# Patient Record
Sex: Female | Born: 1970 | Race: Black or African American | Hispanic: No | Marital: Single | Smoking: Never smoker
Health system: Southern US, Community
[De-identification: ages and names within clinical notes are randomized; demographics above are authoritative.]

## PROBLEM LIST (undated history)

## (undated) DIAGNOSIS — Z9889 Other specified postprocedural states: Secondary | ICD-10-CM

## (undated) DIAGNOSIS — R519 Headache, unspecified: Secondary | ICD-10-CM

## (undated) DIAGNOSIS — F32A Depression, unspecified: Secondary | ICD-10-CM

## (undated) DIAGNOSIS — R112 Nausea with vomiting, unspecified: Secondary | ICD-10-CM

## (undated) DIAGNOSIS — F329 Major depressive disorder, single episode, unspecified: Secondary | ICD-10-CM

## (undated) DIAGNOSIS — I10 Essential (primary) hypertension: Secondary | ICD-10-CM

## (undated) DIAGNOSIS — D649 Anemia, unspecified: Secondary | ICD-10-CM

## (undated) DIAGNOSIS — D329 Benign neoplasm of meninges, unspecified: Secondary | ICD-10-CM

## (undated) DIAGNOSIS — K219 Gastro-esophageal reflux disease without esophagitis: Secondary | ICD-10-CM

## (undated) DIAGNOSIS — F419 Anxiety disorder, unspecified: Secondary | ICD-10-CM

## (undated) DIAGNOSIS — R51 Headache: Secondary | ICD-10-CM

## (undated) HISTORY — PX: WISDOM TOOTH EXTRACTION: SHX21

## (undated) HISTORY — DX: Benign neoplasm of meninges, unspecified: D32.9

## (undated) HISTORY — DX: Depression, unspecified: F32.A

---
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
## 2016-05-15 DIAGNOSIS — R5383 Other fatigue: Secondary | ICD-10-CM | POA: Diagnosis not present

## 2016-05-15 DIAGNOSIS — R51 Headache: Secondary | ICD-10-CM | POA: Diagnosis not present

## 2016-05-15 DIAGNOSIS — R1084 Generalized abdominal pain: Secondary | ICD-10-CM | POA: Diagnosis not present

## 2016-05-15 DIAGNOSIS — I10 Essential (primary) hypertension: Secondary | ICD-10-CM | POA: Diagnosis not present

## 2016-05-15 DIAGNOSIS — R42 Dizziness and giddiness: Secondary | ICD-10-CM | POA: Diagnosis not present

## 2016-05-16 DIAGNOSIS — R51 Headache: Secondary | ICD-10-CM | POA: Diagnosis not present

## 2016-05-16 DIAGNOSIS — R1084 Generalized abdominal pain: Secondary | ICD-10-CM | POA: Diagnosis not present

## 2016-05-16 DIAGNOSIS — D329 Benign neoplasm of meninges, unspecified: Secondary | ICD-10-CM | POA: Diagnosis not present

## 2016-05-16 DIAGNOSIS — R42 Dizziness and giddiness: Secondary | ICD-10-CM | POA: Diagnosis not present

## 2016-05-16 DIAGNOSIS — G936 Cerebral edema: Secondary | ICD-10-CM | POA: Diagnosis not present

## 2016-05-16 DIAGNOSIS — I10 Essential (primary) hypertension: Secondary | ICD-10-CM | POA: Diagnosis not present

## 2016-05-17 ENCOUNTER — Other Ambulatory Visit (HOSPITAL_COMMUNITY): Payer: Self-pay | Admitting: Neurosurgery

## 2016-05-17 DIAGNOSIS — D329 Benign neoplasm of meninges, unspecified: Secondary | ICD-10-CM | POA: Diagnosis not present

## 2016-05-17 DIAGNOSIS — Z6831 Body mass index (BMI) 31.0-31.9, adult: Secondary | ICD-10-CM | POA: Diagnosis not present

## 2016-05-21 ENCOUNTER — Ambulatory Visit (HOSPITAL_COMMUNITY)
Admission: RE | Admit: 2016-05-21 | Discharge: 2016-05-21 | Disposition: A | Discharge status: Home / Self Care | Payer: 59 | Source: Ambulatory Visit | Attending: Neurosurgery | Admitting: Neurosurgery

## 2016-05-21 DIAGNOSIS — D329 Benign neoplasm of meninges, unspecified: Secondary | ICD-10-CM | POA: Insufficient documentation

## 2016-05-21 DIAGNOSIS — R51 Headache: Secondary | ICD-10-CM | POA: Diagnosis not present

## 2016-05-21 DIAGNOSIS — M4802 Spinal stenosis, cervical region: Secondary | ICD-10-CM | POA: Diagnosis not present

## 2016-05-21 LAB — POCT I-STAT CREATININE: CREATININE: 0.8 mg/dL (ref 0.44–1.00)

## 2016-05-21 MED ORDER — GADOBENATE DIMEGLUMINE 529 MG/ML IV SOLN
19.0000 mL | Freq: Once | INTRAVENOUS | Status: AC | PRN
  Administered 2016-05-21: 19 mL via INTRAVENOUS

## 2016-05-24 ENCOUNTER — Other Ambulatory Visit: Payer: Self-pay | Admitting: Neurosurgery

## 2016-05-25 ENCOUNTER — Encounter (HOSPITAL_COMMUNITY): Payer: Self-pay | Admitting: *Deleted

## 2016-05-25 NOTE — Progress Notes (Signed)
Spoke with pt for pre-op call. Pt denies cardiac history, chest pain or sob. Pt is a Jehovah's Witness and will be bringing her Power of Attorney card stating no blood transfusions.

## 2016-05-25 NOTE — Anesthesia Preprocedure Evaluation (Addendum)
Anesthesia Evaluation  Patient identified by MRN, date of birth, ID band Patient awake    Reviewed: Allergy & Precautions, H&P , NPO status , Patient's Chart, lab work & pertinent test results, reviewed documented beta blocker date and time   Airway Mallampati: II  TM Distance: >3 FB Neck ROM: Full    Dental no notable dental hx. (+) Teeth Intact, Dental Advisory Given   Pulmonary neg pulmonary ROS,    Pulmonary exam normal breath sounds clear to auscultation       Cardiovascular hypertension, Pt. on medications and Pt. on home beta blockers  Rhythm:Regular Rate:Normal     Neuro/Psych  Headaches, Anxiety negative psych ROS   GI/Hepatic Neg liver ROS, GERD  Controlled,  Endo/Other  diabetes  Renal/GU negative Renal ROS  negative genitourinary   Musculoskeletal   Abdominal   Peds  Hematology negative hematology ROS (+)   Anesthesia Other Findings   Reproductive/Obstetrics negative OB ROS                           Anesthesia Physical Anesthesia Plan  ASA: II  Anesthesia Plan: General   Post-op Pain Management:    Induction: Intravenous  Airway Management Planned: Oral ETT  Additional Equipment: Arterial line and CVP  Intra-op Plan:   Post-operative Plan: Extubation in OR and Possible Post-op intubation/ventilation  Informed Consent: I have reviewed the patients History and Physical, chart, labs and discussed the procedure including the risks, benefits and alternatives for the proposed anesthesia with the patient or authorized representative who has indicated his/her understanding and acceptance.   Dental advisory given  Plan Discussed with: CRNA and Surgeon  Anesthesia Plan Comments:        Anesthesia Quick Evaluation

## 2016-05-26 ENCOUNTER — Inpatient Hospital Stay (HOSPITAL_COMMUNITY): Payer: 59 | Admitting: Certified Registered"

## 2016-05-26 ENCOUNTER — Inpatient Hospital Stay (HOSPITAL_COMMUNITY): Payer: 59

## 2016-05-26 ENCOUNTER — Inpatient Hospital Stay (HOSPITAL_COMMUNITY)
Admission: RE | Admit: 2016-05-26 | Discharge: 2016-06-01 | DRG: 026 | Disposition: A | Discharge status: Home / Self Care | Payer: 59 | Source: Ambulatory Visit | Attending: Neurosurgery | Admitting: Neurosurgery

## 2016-05-26 ENCOUNTER — Encounter (HOSPITAL_COMMUNITY): Payer: Self-pay | Admitting: Urology

## 2016-05-26 ENCOUNTER — Encounter (HOSPITAL_COMMUNITY)
Admission: RE | Disposition: A | Discharge status: Home / Self Care | Payer: Self-pay | Source: Ambulatory Visit | Attending: Neurosurgery

## 2016-05-26 DIAGNOSIS — Z452 Encounter for adjustment and management of vascular access device: Secondary | ICD-10-CM

## 2016-05-26 DIAGNOSIS — D320 Benign neoplasm of cerebral meninges: Secondary | ICD-10-CM | POA: Diagnosis not present

## 2016-05-26 DIAGNOSIS — D329 Benign neoplasm of meninges, unspecified: Secondary | ICD-10-CM | POA: Diagnosis not present

## 2016-05-26 DIAGNOSIS — I97711 Intraoperative cardiac arrest during other surgery: Secondary | ICD-10-CM | POA: Diagnosis not present

## 2016-05-26 DIAGNOSIS — D332 Benign neoplasm of brain, unspecified: Secondary | ICD-10-CM

## 2016-05-26 DIAGNOSIS — E119 Type 2 diabetes mellitus without complications: Secondary | ICD-10-CM | POA: Diagnosis present

## 2016-05-26 DIAGNOSIS — R001 Bradycardia, unspecified: Secondary | ICD-10-CM | POA: Diagnosis not present

## 2016-05-26 DIAGNOSIS — I10 Essential (primary) hypertension: Secondary | ICD-10-CM | POA: Diagnosis present

## 2016-05-26 DIAGNOSIS — K219 Gastro-esophageal reflux disease without esophagitis: Secondary | ICD-10-CM | POA: Diagnosis present

## 2016-05-26 DIAGNOSIS — R51 Headache: Secondary | ICD-10-CM | POA: Diagnosis present

## 2016-05-26 HISTORY — PX: CRANIOTOMY: SHX93

## 2016-05-26 HISTORY — DX: Headache, unspecified: R51.9

## 2016-05-26 HISTORY — DX: Anemia, unspecified: D64.9

## 2016-05-26 HISTORY — DX: Essential (primary) hypertension: I10

## 2016-05-26 HISTORY — DX: Anxiety disorder, unspecified: F41.9

## 2016-05-26 HISTORY — DX: Gastro-esophageal reflux disease without esophagitis: K21.9

## 2016-05-26 HISTORY — DX: Headache: R51

## 2016-05-26 LAB — BASIC METABOLIC PANEL
Anion gap: 8 (ref 5–15)
BUN: 7 mg/dL (ref 6–20)
CALCIUM: 9 mg/dL (ref 8.9–10.3)
CO2: 24 mmol/L (ref 22–32)
CREATININE: 0.74 mg/dL (ref 0.44–1.00)
Chloride: 104 mmol/L (ref 101–111)
Glucose, Bld: 111 mg/dL — ABNORMAL HIGH (ref 65–99)
Potassium: 3.8 mmol/L (ref 3.5–5.1)
SODIUM: 136 mmol/L (ref 135–145)

## 2016-05-26 LAB — CBC
HCT: 36.6 % (ref 36.0–46.0)
Hemoglobin: 12.4 g/dL (ref 12.0–15.0)
MCH: 28.4 pg (ref 26.0–34.0)
MCHC: 33.9 g/dL (ref 30.0–36.0)
MCV: 83.9 fL (ref 78.0–100.0)
PLATELETS: 360 10*3/uL (ref 150–400)
RBC: 4.36 MIL/uL (ref 3.87–5.11)
RDW: 12.8 % (ref 11.5–15.5)
WBC: 6.2 10*3/uL (ref 4.0–10.5)

## 2016-05-26 LAB — SURGICAL PCR SCREEN
MRSA, PCR: NEGATIVE
STAPHYLOCOCCUS AUREUS: POSITIVE — AB

## 2016-05-26 LAB — HCG, SERUM, QUALITATIVE: PREG SERUM: NEGATIVE

## 2016-05-26 LAB — NO BLOOD PRODUCTS

## 2016-05-26 SURGERY — CRANIOTOMY TUMOR EXCISION
Anesthesia: General | Site: Head

## 2016-05-26 MED ORDER — BUPIVACAINE HCL (PF) 0.5 % IJ SOLN
INTRAMUSCULAR | Status: DC | PRN
  Administered 2016-05-26: 20 mL

## 2016-05-26 MED ORDER — FENTANYL CITRATE (PF) 100 MCG/2ML IJ SOLN
INTRAMUSCULAR | Status: DC | PRN
  Administered 2016-05-26: 50 ug via INTRAVENOUS
  Administered 2016-05-26: 25 ug via INTRAVENOUS

## 2016-05-26 MED ORDER — MIDAZOLAM HCL 2 MG/2ML IJ SOLN
INTRAMUSCULAR | Status: AC
  Filled 2016-05-26: qty 2

## 2016-05-26 MED ORDER — 0.9 % SODIUM CHLORIDE (POUR BTL) OPTIME
TOPICAL | Status: DC | PRN
  Administered 2016-05-26 (×2): 1000 mL

## 2016-05-26 MED ORDER — SUGAMMADEX SODIUM 200 MG/2ML IV SOLN
INTRAVENOUS | Status: DC | PRN
  Administered 2016-05-26: 200 mg via INTRAVENOUS

## 2016-05-26 MED ORDER — PROMETHAZINE HCL 25 MG/ML IJ SOLN
6.2500 mg | Freq: Once | INTRAMUSCULAR | Status: AC
  Administered 2016-05-26: 6.25 mg via INTRAVENOUS

## 2016-05-26 MED ORDER — CEFAZOLIN SODIUM-DEXTROSE 2-4 GM/100ML-% IV SOLN
2.0000 g | INTRAVENOUS | Status: AC
  Administered 2016-05-26 (×2): 2 g via INTRAVENOUS

## 2016-05-26 MED ORDER — LIDOCAINE-EPINEPHRINE 1 %-1:100000 IJ SOLN
INTRAMUSCULAR | Status: AC
  Filled 2016-05-26: qty 1

## 2016-05-26 MED ORDER — SENNOSIDES-DOCUSATE SODIUM 8.6-50 MG PO TABS: 1.0000 | ORAL_TABLET | Freq: Every evening | ORAL | Status: DC | PRN

## 2016-05-26 MED ORDER — FENTANYL CITRATE (PF) 100 MCG/2ML IJ SOLN
25.0000 ug | Freq: Once | INTRAMUSCULAR | Status: AC
  Administered 2016-05-26: 25 ug via INTRAVENOUS

## 2016-05-26 MED ORDER — SODIUM CHLORIDE 0.9 % IR SOLN
Status: DC | PRN
  Administered 2016-05-26 (×3): 1000 mL

## 2016-05-26 MED ORDER — MUPIROCIN 2 % EX OINT
1.0000 "application " | TOPICAL_OINTMENT | Freq: Once | CUTANEOUS | Status: AC
  Administered 2016-05-26: 1 via TOPICAL

## 2016-05-26 MED ORDER — HYDROMORPHONE HCL 2 MG/ML IJ SOLN
INTRAMUSCULAR | Status: AC
  Administered 2016-05-26: 0.5 mg
  Filled 2016-05-26: qty 1

## 2016-05-26 MED ORDER — DEXAMETHASONE SODIUM PHOSPHATE 10 MG/ML IJ SOLN
INTRAMUSCULAR | Status: DC | PRN
  Administered 2016-05-26 (×2): 10 mg via INTRAVENOUS

## 2016-05-26 MED ORDER — SERTRALINE HCL 100 MG PO TABS: 100.0000 mg | ORAL_TABLET | Freq: Every day | ORAL | Status: DC | PRN

## 2016-05-26 MED ORDER — BACITRACIN ZINC 500 UNIT/GM EX OINT
TOPICAL_OINTMENT | CUTANEOUS | Status: AC
  Filled 2016-05-26: qty 28.35

## 2016-05-26 MED ORDER — PHENYLEPHRINE HCL 10 MG/ML IJ SOLN
INTRAVENOUS | Status: DC | PRN
  Administered 2016-05-26: 20 ug/min via INTRAVENOUS

## 2016-05-26 MED ORDER — PROPOFOL 10 MG/ML IV BOLUS
INTRAVENOUS | Status: AC
  Filled 2016-05-26: qty 20

## 2016-05-26 MED ORDER — HYDROMORPHONE HCL 1 MG/ML IJ SOLN
0.2500 mg | INTRAMUSCULAR | Status: DC | PRN
  Administered 2016-05-26 (×2): 0.5 mg via INTRAVENOUS

## 2016-05-26 MED ORDER — ACETAMINOPHEN 650 MG RE SUPP: 650.0000 mg | RECTAL | Status: DC | PRN

## 2016-05-26 MED ORDER — THROMBIN 20000 UNITS EX SOLR
CUTANEOUS | Status: AC
  Filled 2016-05-26: qty 20000

## 2016-05-26 MED ORDER — FENTANYL CITRATE (PF) 100 MCG/2ML IJ SOLN
INTRAMUSCULAR | Status: AC
  Filled 2016-05-26: qty 2

## 2016-05-26 MED ORDER — POTASSIUM CHLORIDE IN NACL 20-0.9 MEQ/L-% IV SOLN
INTRAVENOUS | Status: DC
  Administered 2016-05-26 – 2016-05-31 (×6): via INTRAVENOUS
  Filled 2016-05-26 (×10): qty 1000

## 2016-05-26 MED ORDER — HYDROCODONE-ACETAMINOPHEN 5-325 MG PO TABS
1.0000 | ORAL_TABLET | ORAL | Status: DC | PRN
Start: 2016-05-26 — End: 2016-06-01
  Administered 2016-05-27 – 2016-06-01 (×18): 1 via ORAL
  Filled 2016-05-26 (×19): qty 1

## 2016-05-26 MED ORDER — SUGAMMADEX SODIUM 200 MG/2ML IV SOLN
INTRAVENOUS | Status: AC
  Filled 2016-05-26: qty 2

## 2016-05-26 MED ORDER — ONDANSETRON HCL 4 MG/2ML IJ SOLN
INTRAMUSCULAR | Status: DC | PRN
  Administered 2016-05-26: 4 mg via INTRAVENOUS

## 2016-05-26 MED ORDER — CHLORHEXIDINE GLUCONATE CLOTH 2 % EX PADS
6.0000 | MEDICATED_PAD | Freq: Every day | CUTANEOUS | Status: AC
  Administered 2016-05-27 – 2016-05-31 (×5): 6 via TOPICAL

## 2016-05-26 MED ORDER — ATENOLOL 50 MG PO TABS
50.0000 mg | ORAL_TABLET | Freq: Every day | ORAL | Status: DC
  Administered 2016-05-27 – 2016-06-01 (×4): 50 mg via ORAL
  Filled 2016-05-26 (×4): qty 1

## 2016-05-26 MED ORDER — ONDANSETRON HCL 4 MG PO TABS
4.0000 mg | ORAL_TABLET | ORAL | Status: DC | PRN
  Administered 2016-05-31 – 2016-06-01 (×4): 4 mg via ORAL
  Filled 2016-05-26 (×4): qty 1

## 2016-05-26 MED ORDER — CEFAZOLIN SODIUM-DEXTROSE 2-4 GM/100ML-% IV SOLN
INTRAVENOUS | Status: AC
  Filled 2016-05-26: qty 100

## 2016-05-26 MED ORDER — LACTATED RINGERS IV SOLN
INTRAVENOUS | Status: DC | PRN
  Administered 2016-05-26: 09:00:00 via INTRAVENOUS

## 2016-05-26 MED ORDER — DEXAMETHASONE SODIUM PHOSPHATE 10 MG/ML IJ SOLN
6.0000 mg | Freq: Four times a day (QID) | INTRAMUSCULAR | Status: AC
  Administered 2016-05-26 – 2016-05-27 (×4): 6 mg via INTRAVENOUS
  Filled 2016-05-26 (×4): qty 1

## 2016-05-26 MED ORDER — CHLORHEXIDINE GLUCONATE CLOTH 2 % EX PADS: 6.0000 | MEDICATED_PAD | Freq: Once | CUTANEOUS | Status: DC

## 2016-05-26 MED ORDER — NALOXONE HCL 0.4 MG/ML IJ SOLN: 0.0800 mg | INTRAMUSCULAR | Status: DC | PRN

## 2016-05-26 MED ORDER — BISACODYL 5 MG PO TBEC: 5.0000 mg | DELAYED_RELEASE_TABLET | Freq: Every day | ORAL | Status: DC | PRN

## 2016-05-26 MED ORDER — MAGNESIUM CITRATE PO SOLN: 1.0000 | Freq: Once | ORAL | Status: DC | PRN

## 2016-05-26 MED ORDER — MUPIROCIN 2 % EX OINT
1.0000 "application " | TOPICAL_OINTMENT | Freq: Two times a day (BID) | CUTANEOUS | Status: AC
  Administered 2016-05-27 – 2016-05-31 (×10): 1 via NASAL

## 2016-05-26 MED ORDER — BUPIVACAINE HCL (PF) 0.5 % IJ SOLN
INTRAMUSCULAR | Status: AC
  Filled 2016-05-26: qty 30

## 2016-05-26 MED ORDER — EPHEDRINE SULFATE 50 MG/ML IJ SOLN
INTRAMUSCULAR | Status: DC | PRN
  Administered 2016-05-26: 5 mg via INTRAVENOUS

## 2016-05-26 MED ORDER — ONDANSETRON HCL 4 MG/2ML IJ SOLN
INTRAMUSCULAR | Status: AC
  Filled 2016-05-26: qty 2

## 2016-05-26 MED ORDER — MICROFIBRILLAR COLL HEMOSTAT EX PADS
MEDICATED_PAD | CUTANEOUS | Status: DC | PRN
  Administered 2016-05-26: 1 via TOPICAL

## 2016-05-26 MED ORDER — DEXAMETHASONE SODIUM PHOSPHATE 4 MG/ML IJ SOLN
4.0000 mg | Freq: Three times a day (TID) | INTRAMUSCULAR | Status: DC
  Administered 2016-05-28 – 2016-05-30 (×5): 4 mg via INTRAVENOUS
  Filled 2016-05-26 (×6): qty 1

## 2016-05-26 MED ORDER — SENNA 8.6 MG PO TABS
1.0000 | ORAL_TABLET | Freq: Two times a day (BID) | ORAL | Status: DC
  Administered 2016-05-26 – 2016-06-01 (×11): 8.6 mg via ORAL
  Filled 2016-05-26 (×12): qty 1

## 2016-05-26 MED ORDER — ACETAMINOPHEN 325 MG PO TABS
650.0000 mg | ORAL_TABLET | ORAL | Status: DC | PRN
  Administered 2016-06-01: 650 mg via ORAL
  Filled 2016-05-26: qty 2

## 2016-05-26 MED ORDER — LIDOCAINE-EPINEPHRINE 1 %-1:100000 IJ SOLN
INTRAMUSCULAR | Status: DC | PRN
  Administered 2016-05-26: 10 mL

## 2016-05-26 MED ORDER — SODIUM CHLORIDE 0.9 % IV SOLN
0.0500 ug/kg/min | INTRAVENOUS | Status: AC
  Administered 2016-05-26: .07 ug/kg/min via INTRAVENOUS
  Filled 2016-05-26: qty 5000

## 2016-05-26 MED ORDER — SODIUM CHLORIDE 0.9 % IV SOLN
0.0500 ug/kg/min | Freq: Once | INTRAVENOUS | Status: DC
  Filled 2016-05-26: qty 5000

## 2016-05-26 MED ORDER — CEFAZOLIN SODIUM-DEXTROSE 2-4 GM/100ML-% IV SOLN
2.0000 g | Freq: Once | INTRAVENOUS | Status: DC
  Filled 2016-05-26: qty 100

## 2016-05-26 MED ORDER — ONDANSETRON HCL 4 MG/2ML IJ SOLN
4.0000 mg | INTRAMUSCULAR | Status: DC | PRN
  Administered 2016-05-26 (×2): 4 mg via INTRAVENOUS
  Filled 2016-05-26 (×2): qty 2

## 2016-05-26 MED ORDER — PROPOFOL 10 MG/ML IV BOLUS
INTRAVENOUS | Status: DC | PRN
  Administered 2016-05-26: 100 mg via INTRAVENOUS

## 2016-05-26 MED ORDER — PROMETHAZINE HCL 25 MG/ML IJ SOLN
INTRAMUSCULAR | Status: AC
  Filled 2016-05-26: qty 1

## 2016-05-26 MED ORDER — LACTATED RINGERS IV SOLN
INTRAVENOUS | Status: DC | PRN
  Administered 2016-05-26 (×2): via INTRAVENOUS

## 2016-05-26 MED ORDER — LIDOCAINE-EPINEPHRINE 1.5 %-1:200,000 OPTIME - NO CHARGE
INTRAMUSCULAR | Status: DC | PRN
  Administered 2016-05-26: 10 mL

## 2016-05-26 MED ORDER — PHENYLEPHRINE 40 MCG/ML (10ML) SYRINGE FOR IV PUSH (FOR BLOOD PRESSURE SUPPORT)
PREFILLED_SYRINGE | INTRAVENOUS | Status: DC | PRN
  Administered 2016-05-26: 80 ug via INTRAVENOUS

## 2016-05-26 MED ORDER — DEXAMETHASONE SODIUM PHOSPHATE 4 MG/ML IJ SOLN
4.0000 mg | Freq: Four times a day (QID) | INTRAMUSCULAR | Status: AC
  Administered 2016-05-27 – 2016-05-28 (×4): 4 mg via INTRAVENOUS
  Filled 2016-05-26 (×4): qty 1

## 2016-05-26 MED ORDER — MIDAZOLAM HCL 2 MG/2ML IJ SOLN
INTRAMUSCULAR | Status: DC | PRN
  Administered 2016-05-26: 1 mg via INTRAVENOUS
  Administered 2016-05-26: 2 mg via INTRAVENOUS

## 2016-05-26 MED ORDER — MORPHINE SULFATE (PF) 2 MG/ML IV SOLN
1.0000 mg | INTRAVENOUS | Status: DC | PRN
  Administered 2016-05-27: 1 mg via INTRAVENOUS
  Administered 2016-05-27: 2 mg via INTRAVENOUS
  Filled 2016-05-26 (×2): qty 1

## 2016-05-26 MED ORDER — PROMETHAZINE HCL 12.5 MG PO TABS
12.5000 mg | ORAL_TABLET | ORAL | Status: DC | PRN
  Administered 2016-05-29: 12.5 mg via ORAL
  Filled 2016-05-26 (×3): qty 2

## 2016-05-26 MED ORDER — LABETALOL HCL 5 MG/ML IV SOLN: 10.0000 mg | INTRAVENOUS | Status: DC | PRN

## 2016-05-26 MED ORDER — MUPIROCIN 2 % EX OINT
TOPICAL_OINTMENT | CUTANEOUS | Status: AC
  Administered 2016-05-26: 1 via TOPICAL
  Filled 2016-05-26: qty 22

## 2016-05-26 MED ORDER — THROMBIN 20000 UNITS EX SOLR
CUTANEOUS | Status: DC | PRN
  Administered 2016-05-26 (×2): via TOPICAL

## 2016-05-26 MED ORDER — THROMBIN 5000 UNITS EX SOLR
CUTANEOUS | Status: AC
  Filled 2016-05-26: qty 5000

## 2016-05-26 MED ORDER — FENTANYL CITRATE (PF) 100 MCG/2ML IJ SOLN
INTRAMUSCULAR | Status: AC
  Filled 2016-05-26: qty 4

## 2016-05-26 MED ORDER — CEFAZOLIN IN D5W 1 GM/50ML IV SOLN
1.0000 g | Freq: Three times a day (TID) | INTRAVENOUS | Status: DC
  Administered 2016-05-26 – 2016-06-01 (×14): 1 g via INTRAVENOUS
  Filled 2016-05-26 (×22): qty 50

## 2016-05-26 MED ORDER — HYDROCHLOROTHIAZIDE 25 MG PO TABS
12.5000 mg | ORAL_TABLET | Freq: Every day | ORAL | Status: DC
  Administered 2016-05-26 – 2016-06-01 (×5): 12.5 mg via ORAL
  Filled 2016-05-26 (×5): qty 1

## 2016-05-26 MED ORDER — BACITRACIN ZINC 500 UNIT/GM EX OINT
TOPICAL_OINTMENT | CUTANEOUS | Status: DC | PRN
  Administered 2016-05-26: 1 via TOPICAL

## 2016-05-26 MED ORDER — ROCURONIUM BROMIDE 100 MG/10ML IV SOLN
INTRAVENOUS | Status: DC | PRN
  Administered 2016-05-26: 50 mg via INTRAVENOUS
  Administered 2016-05-26: 20 mg via INTRAVENOUS
  Administered 2016-05-26 (×4): 10 mg via INTRAVENOUS
  Administered 2016-05-26: 50 mg via INTRAVENOUS
  Administered 2016-05-26: 10 mg via INTRAVENOUS

## 2016-05-26 SURGICAL SUPPLY — 64 items
BLADE CLIPPER SURG (BLADE) ×4 IMPLANT
BLADE ULTRA TIP 2M (BLADE) ×2 IMPLANT
BUR ACORN 6.0 PRECISION (BURR) ×2 IMPLANT
BUR MATCHSTICK NEURO 3.0 LAGG (BURR) ×2 IMPLANT
BUR RND OSTEON ELITE 6.0 (BURR) ×2 IMPLANT
BUR SPIRAL ROUTER 2.3 (BUR) ×2 IMPLANT
CANISTER SUCT 3000ML PPV (MISCELLANEOUS) ×4 IMPLANT
CATH VENTRIC 35X38 W/TROCAR LG (CATHETERS) ×2 IMPLANT
CLIP TI MEDIUM 6 (CLIP) ×2 IMPLANT
CONT SPEC 4OZ CLIKSEAL STRL BL (MISCELLANEOUS) ×4 IMPLANT
DRAPE NEUROLOGICAL W/INCISE (DRAPES) IMPLANT
DRAPE PROXIMA HALF (DRAPES) ×2 IMPLANT
DRAPE WARM FLUID 44X44 (DRAPE) ×2 IMPLANT
DRSG OPSITE POSTOP 4X8 (GAUZE/BANDAGES/DRESSINGS) ×2 IMPLANT
DURAPREP 6ML APPLICATOR 50/CS (WOUND CARE) ×6 IMPLANT
ELECT REM PT RETURN 9FT ADLT (ELECTROSURGICAL) ×2
ELECTRODE REM PT RTRN 9FT ADLT (ELECTROSURGICAL) ×1 IMPLANT
FORCEPS BIPOLAR SPETZLER 8 1.0 (NEUROSURGERY SUPPLIES) ×2 IMPLANT
GAUZE SPONGE 4X4 12PLY STRL (GAUZE/BANDAGES/DRESSINGS) IMPLANT
GLOVE BIO SURGEON STRL SZ7 (GLOVE) ×2 IMPLANT
GLOVE BIOGEL PI IND STRL 6.5 (GLOVE) ×2 IMPLANT
GLOVE BIOGEL PI IND STRL 7.0 (GLOVE) ×3 IMPLANT
GLOVE BIOGEL PI IND STRL 7.5 (GLOVE) ×4 IMPLANT
GLOVE BIOGEL PI INDICATOR 6.5 (GLOVE) ×2
GLOVE BIOGEL PI INDICATOR 7.0 (GLOVE) ×3
GLOVE BIOGEL PI INDICATOR 7.5 (GLOVE) ×4
GLOVE ECLIPSE 6.5 STRL STRAW (GLOVE) ×6 IMPLANT
GLOVE ECLIPSE 7.0 STRL STRAW (GLOVE) ×2 IMPLANT
GLOVE SS N UNI LF 6.5 STRL (GLOVE) ×8 IMPLANT
GLOVE SS N UNI LF 7.0 STRL (GLOVE) ×6 IMPLANT
GOWN STRL REUS W/ TWL LRG LVL3 (GOWN DISPOSABLE) ×2 IMPLANT
GOWN STRL REUS W/ TWL XL LVL3 (GOWN DISPOSABLE) ×3 IMPLANT
GOWN STRL REUS W/TWL 2XL LVL3 (GOWN DISPOSABLE) IMPLANT
GOWN STRL REUS W/TWL LRG LVL3 (GOWN DISPOSABLE) ×2
GOWN STRL REUS W/TWL XL LVL3 (GOWN DISPOSABLE) ×3
GRAFT PERICARIUM DURAL BV 4X5 (Graft) ×2 IMPLANT
HEMOSTAT SURGICEL 2X14 (HEMOSTASIS) ×2 IMPLANT
IV NS 1000ML (IV SOLUTION) ×3
IV NS 1000ML BAXH (IV SOLUTION) ×3 IMPLANT
KIT BASIN OR (CUSTOM PROCEDURE TRAY) ×2 IMPLANT
KIT DRAIN CSF ACCUDRAIN (MISCELLANEOUS) ×2 IMPLANT
KIT ROOM TURNOVER OR (KITS) ×2 IMPLANT
NEEDLE HYPO 25X1 1.5 SAFETY (NEEDLE) ×2 IMPLANT
NS IRRIG 1000ML POUR BTL (IV SOLUTION) ×6 IMPLANT
PACK CRANIOTOMY (CUSTOM PROCEDURE TRAY) ×2 IMPLANT
PATTIES SURGICAL .5 X.5 (GAUZE/BANDAGES/DRESSINGS) ×2 IMPLANT
PATTIES SURGICAL .5 X3 (DISPOSABLE) ×4 IMPLANT
PATTIES SURGICAL .5X1.5 (GAUZE/BANDAGES/DRESSINGS) ×6 IMPLANT
PATTIES SURGICAL 1/4 X 3 (GAUZE/BANDAGES/DRESSINGS) ×2 IMPLANT
SET TUBING W/EXT DISP (INSTRUMENTS) ×2 IMPLANT
SPONGE SURGIFOAM ABS GEL 100 (HEMOSTASIS) ×4 IMPLANT
STAPLER VISISTAT 35W (STAPLE) ×2 IMPLANT
SUT NURALON 4 0 TR CR/8 (SUTURE) ×8 IMPLANT
SUT VIC AB 0 CT1 18XCR BRD8 (SUTURE) ×1 IMPLANT
SUT VIC AB 0 CT1 8-18 (SUTURE) ×1
SUT VIC AB 2-0 CT2 18 VCP726D (SUTURE) ×2 IMPLANT
SYR CONTROL 10ML LL (SYRINGE) ×2 IMPLANT
TIP STRAIGHT 25KHZ (INSTRUMENTS) ×2 IMPLANT
TOWEL OR 17X24 6PK STRL BLUE (TOWEL DISPOSABLE) ×4 IMPLANT
TOWEL OR 17X26 10 PK STRL BLUE (TOWEL DISPOSABLE) ×2 IMPLANT
TRAY FOLEY W/METER SILVER 16FR (SET/KITS/TRAYS/PACK) ×2 IMPLANT
TUBE CONNECTING 12X1/4 (SUCTIONS) ×2 IMPLANT
UNDERPAD 30X30 (UNDERPADS AND DIAPERS) ×6 IMPLANT
WATER STERILE IRR 1000ML POUR (IV SOLUTION) ×2 IMPLANT

## 2016-05-26 NOTE — Progress Notes (Signed)
Patient began vomiting after taking medications. Moderate amount of drainage noted from occipital craniotomy site on sheet and pillow case after vomiting. Dressing reinforced.

## 2016-05-26 NOTE — Anesthesia Procedure Notes (Addendum)
Central Venous Catheter Insertion Performed by: anesthesiologist 05/26/2016 8:58 AM Patient location: Pre-op. Preanesthetic checklist: patient identified, IV checked, site marked, risks and benefits discussed, surgical consent, monitors and equipment checked, pre-op evaluation, timeout performed and anesthesia consent Position: Trendelenburg Lidocaine 1% used for infiltration Landmarks identified and Seldinger technique used Catheter size: 8 Fr Central line was placed.Double lumen Procedure performed using ultrasound guided technique. Attempts: 2 (Attempted [place] vein. Unable to find vein.) Following insertion, dressing applied, line sutured and Biopatch. Post procedure assessment: blood return through all ports. Patient tolerated the procedure well with no immediate complications.

## 2016-05-26 NOTE — Op Note (Signed)
05/26/2016  4:40 PM  PATIENT:  Alexa [name]  45 y.o. 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  PRE-OPERATIVE DIAGNOSIS: posterior fossa tentorial  MENINGIOMA  POST-OPERATIVE DIAGNOSIS:  same  PROCEDURE:  Procedure(s): SUBOCCIPITAL CRANIOTOMY for tumor resection  PLACEMENT OF right frontal VENTRICULAR CATHETER, via twist drill burr hole  SURGEON: Surgeon(s): Ashok Pall, MD Consuella Lose, MD  ASSISTANTS:Nundkumar, Nena Polio  ANESTHESIA:   general  EBL:  Total I/O In: 2200 [I.V.:2200] Out: 2700 [Urine:2100; Blood:600]  BLOOD ADMINISTERED:none  CELL SAVER GIVEN:none  COUNT:per nursing  DRAINS: Ventriculostomy Drain in the lateral ventricle   SPECIMEN:  Source of Specimen:  posterior fossa  DICTATION: Alexa [name] was taken to the operating room, intubated, and placed under a general anesthetic without difficulty. While on the stretcher, I elevated her head. I shaved her head and prepared it in a sterile manner. I then placed the ventricular  Catheter.  I opened the right frontal region with a scalpel after infiltrating 2%lidocaine in the planned incision. I used the hand twist drill to creat a burr hole. I opened the dura with a spinal needle. I then passed the ventricular catheter into the lateral ventricle. I tunneled the catheter posteriorly, and closed the cranial incision. I secured the catheter to the scalp and placed a sterile dressing. The catheter was draining well at that time.  I placed a [place] three pin head holder on her head to 3.5.  She was positioned prone on chest rolls on the operating room bed, and her head secured to the bed. All pressure points were properly padded, and her neck was not under tension.  Her suboccipital region was prepped and draped in a sterile manner. I infiltrated lidocaine into the planned curvilinear incision midposition between the midline  and the mastoid process. I opened the incision with a 10 blade taking the incision down through the cervical fascia. I used monopolar cautery to divide the deep cervical musculature. I exposed the lateral occiput on the left, and the posterior arch of C1. I used the drill and Kerrison punches to perform a craniectomy to the level of the transverse sinus. The dura was tense, and felt hard since I was directly over the mass. I opened the dura in a cruciate fashion and immediately encountered the mass. I coagulated the capsule, used a 15 blade to incise the capsule then sent samples of the tumor. It was mildly vascular, and confirmed at least by frozen sampling, to be a meningioma. This was the expected finding.  I after opening the capsule used a cusa to hollow out the tumor. Over the next few hours Dr. Kathyrn Sheriff and I used the Cusa, cautery, and forceps to remove the tumor. I coagulated the capsule, and used cottonids to separate the cerebellar tissue from the tumor capsule. We worked around and in the tumor to gradually debulk and remove the tumor in a subtotal resection.  We were working on the last part of the remaining mass, when manipulation and dissection led to 4seconds of asystole. She regained a pulse and a normal blood pressure. We did remove a little bit more of what I believed to be the remaining tumor attached to the tentorium. She had another very brief episode of bradycardia, and at that point I decided to end dissection and to leave the remaining tumor.  We achieved hemostasis, laid surgicel over the cerebellar surfaces, and then started to close. We approximated  the dura by sewing in a dural patch. We then approximated the cervical fascia, subcutaneous and subcuticular planes with sutures. I placed an occlusive bandage for a sterile dressing. I removed the head holder from the bed, and we subsequently rolled her supine onto the hospital bed.   PLAN OF CARE: Admit to inpatient   PATIENT  DISPOSITION:  PACU - hemodynamically stable.   Delay start of Pharmacological VTE agent (>24hrs) due to surgical blood loss or risk of bleeding:  yes

## 2016-05-26 NOTE — Progress Notes (Signed)
Pt signed form for refusal of blood products. Pt states she will accept synthetic albumin. Pt states that Dr. Christella Noa is aware of this.

## 2016-05-26 NOTE — Transfer of Care (Signed)
Immediate Anesthesia Transfer of Care Note  Patient: Alexa [name]  Procedure(s) Performed: Procedure(s) with comments: SUBOCCIPITAL CRANIOTOMY WITH PLACEMENT OF VENTRICULAR CATHETER - CRANIOTOMY - SUBOCCIPITAL  Patient Location: PACU  Anesthesia Type:General  Level of Consciousness: awake, alert  and oriented  Airway & Oxygen Therapy: Patient Spontanous Breathing and Patient connected to nasal cannula oxygen  Post-op Assessment: Report given to RN, Post -op Vital signs reviewed and stable and Patient moving all extremities  Post vital signs: Reviewed and stable  Last Vitals:  Vitals:   05/26/16 0900 05/26/16 1643  BP: 116/74   Pulse:  (!) 59  Resp:    Temp:  36.6 C    Last Pain:  Vitals:   05/26/16 0707  TempSrc: Oral         Complications: No apparent anesthesia complications

## 2016-05-26 NOTE — Anesthesia Procedure Notes (Signed)
Procedure Name: Intubation Date/Time: 05/26/2016 9:41 AM Performed by: Trixie Deis A Pre-anesthesia Checklist: Patient identified, Emergency Drugs available, Suction available and Patient being monitored Patient Re-evaluated:Patient Re-evaluated prior to inductionOxygen Delivery Method: Circle System Utilized Preoxygenation: Pre-oxygenation with 100% oxygen Intubation Type: IV induction Ventilation: Mask ventilation without difficulty Laryngoscope Size: Mac and 3 Grade View: Grade I Tube type: Oral Tube size: 7.5 mm Number of attempts: 1 Airway Equipment and Method: Stylet and Oral airway Placement Confirmation: ETT inserted through vocal cords under direct vision,  positive ETCO2 and breath sounds checked- equal and bilateral Secured at: 21 cm Tube secured with: Tape Dental Injury: Teeth and Oropharynx as per pre-operative assessment

## 2016-05-26 NOTE — H&P (Signed)
BP 122/73   Pulse (!) 51   Temp 98 F (36.7 C) (Oral)   Resp 18   Ht 5\' 8"  (1.727 m)   Wt 94.3 kg (208 lb)   LMP 04/27/2016   SpO2 100%   BMI 31.63 kg/m  HOPI: Alexa [name] is sent to me today for evaluation of a newly diagnosed probable posterior fossa mass on the left side. It is depressing the left cerebellar hemisphere and seems to be going from the tentorium. Alexa [name] had this identified only because Alexa [name] was having headaches and a head CT was ordered. Alexa [name] has had no coordination problems. Alexa [name] has had no great difficulty with Alexa [name] balance. Alexa [name] says that Alexa [name] is awkward and will stumble at times, but nothing that anybody else is paying that much attention to. Alexa [name] has had excruciating headaches, which have been centered to the left and which have been persistent. Alexa [name] said quick movements will cause headaches. Alexa [name] has had no seizures or other neurologic problems.  DATA: CT, which was done without contrast, shows a partially calcified spherical mass in the left posterior fossa, causing some compression of the cerebellar hemisphere. The fourth ventricle is widely patent. The ventricles are essentially normal in size. I would not call it a hydrocephalic pattern at all.  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 Mother is 72, in fair health. Father is 66, in fair health. Alexa [name] is 46 years of [name].  MEDICATIONS: Alexa [name] takes Atenolol, Hydrochlorothiazide, and Sertraline.  REVIEW OF SYSTEMS: Alexa [name] has occasional weakness Alexa [name] says in Alexa [name] limbs, some tingling in the left jaw, and headaches. On Alexa [name] review of systems, Alexa [name] describes neck pain, back pain, anxiety, and depression. Ibuprofen will relieve the headache.  PHYSICAL EXAMINATION: Alexa [name] is 5 feet 8 inches, weighs 208 pounds, temperature is 97.2 degrees Fahrenheit, respiratory rate is 14, blood pressure is 132/88, pulse is 70. On exam, Alexa [name] is alert, oriented x4, answering all questions appropriately. Speech is clear. There is no dysarthria. Pupils are  equal, round, and reactive to light. Full extraocular movements. There is no nystagmus. Normal visual fields. Symmetric facial sensation and hearing. Symmetric facial movement. Uvula elevates in the midline. Shoulder shrug is normal. Tongue protrudes in the midline. Alexa [name] has no dysdiadokinesis. No dysmetria or past-pointing. Romberg is negative. Alexa [name] can tandem walk. Strength is 5/5 in the upper and lower extremities. Normal muscle tone, bulk, and coordination.  IMPRESSION/PLAN: I believe that Alexa [name] needs further characterization of the tumor and I will order an MRI of the brain so that can be done. I have discussed the risks and benefits of surgery including but not limited to stroke, coma, death, paralysis, worsened coordination, inability to remove the tumor, brain damage, need for more surgery, bleeding, infection, seizures,and other risks. Alexa [name] understands and wishes to proceed. Marland Kitchen

## 2016-05-27 ENCOUNTER — Inpatient Hospital Stay (HOSPITAL_COMMUNITY): Payer: 59

## 2016-05-27 ENCOUNTER — Encounter (HOSPITAL_COMMUNITY): Payer: Self-pay | Admitting: Neurosurgery

## 2016-05-27 MED ORDER — GADOBENATE DIMEGLUMINE 529 MG/ML IV SOLN
20.0000 mL | Freq: Once | INTRAVENOUS | Status: AC | PRN
  Administered 2016-05-27: 20 mL via INTRAVENOUS

## 2016-05-27 NOTE — Consult Note (Signed)
   [facility] CM Inpatient Consult   05/27/2016  Alexa [name] July 03, 1971 [id number]    Went to bedside on behalf of [facility] Care Management program for [facility] employees/dependents with [facility] insurance. Ms. Valera is currently in ICU. She was resting upon bedside visit. Left Link to Wellness packet, contact information, 24-hr nurse line magnet at bedside. Will refer for post hospital discharge call.   Marthenia Rolling, MSN-Ed, RN,BSN [facility] Liaison [phone]

## 2016-05-27 NOTE — Care Management Note (Signed)
Case Management Note  Patient Details  Name: Alexa [name] MRN: [id number] Date of Birth: [birth year]  Subjective/Objective:  Pt admitted on 05/26/16 s/p craniotomy for tumor resection and IVC placement.  PTA, pt independent of ADLS.                    Action/Plan: Will follow post op for discharge planning needs.    Expected Discharge Date:  05/29/16               Expected Discharge Plan:  Home/Self Care  In-House Referral:     Discharge planning Services  CM Consult  Post Acute Care Choice:    Choice offered to:     DME Arranged:    DME Agency:     HH Arranged:    HH Agency:     Status of Service:  In process, will continue to follow  If discussed at Long Length of Stay Meetings, dates discussed:    Additional Comments:  Reinaldo Raddle, RN, BSN  Trauma/Neuro ICU Case Manager [phone]

## 2016-05-28 NOTE — Plan of Care (Signed)
Problem: Safety: Goal: Ability to remain free from injury will improve Outcome: Progressing Knowledge of care plan increasing  Problem: Pain Managment: Goal: General experience of comfort will improve Outcome: Progressing Pt not in significant pain  Problem: Nutrition: Goal: Adequate nutrition will be maintained Outcome: Progressing Regular diet ordered

## 2016-05-28 NOTE — Progress Notes (Signed)
Patient ID: Alexa [name], female   DOB: Oct 29, 1970, 45 y.o.   MRN: [id number] BP 101/67   Pulse 78   Temp 97.7 F (36.5 C) (Oral)   Resp 16   Ht 5\' 8"  (1.727 m)   Wt 94.3 kg (208 lb)   LMP 04/27/2016   SpO2 99%   BMI 31.63 kg/m  Alert and oriented x 4 speech is clear and fluent Moving all extremities well  Pupils equal round and reactive to light Wound dressing is dry Ventricular catheter in place, draining well Post op mri shows tumor remaining along the tentorium. Good decompression of the cerebellum. Continue ventricular drainage. Doing well overall

## 2016-05-28 NOTE — Progress Notes (Signed)
Overall stable. Denies headache. Patient awake and alert. She is oriented and appropriate. Motor and sensory function intact. Cranial nerve function intact. Wound clean and dry.  Vital signs stable. Low-grade fever. Ventriculostomy working well. Fluid blood-tinged. No new labs studies.  Overall stable status post debulking of tentorial meningioma. Plan is I understand it is to continue ventricular drainage the weekend and work toward weaning out ventriculostomy. Plan for further resection unknown.

## 2016-05-29 LAB — POCT I-STAT 7, (LYTES, BLD GAS, ICA,H+H)
Acid-base deficit: 2 mmol/L (ref 0.0–2.0)
BICARBONATE: 24.4 mmol/L (ref 20.0–28.0)
Bicarbonate: 22.7 mmol/L (ref 20.0–28.0)
Calcium, Ion: 1.17 mmol/L (ref 1.15–1.40)
Calcium, Ion: 1.16 mmol/L (ref 1.15–1.40)
HCT: 32 % — ABNORMAL LOW (ref 36.0–46.0)
HEMATOCRIT: 30 % — AB (ref 36.0–46.0)
HEMOGLOBIN: 10.2 g/dL — AB (ref 12.0–15.0)
Hemoglobin: 10.9 g/dL — ABNORMAL LOW (ref 12.0–15.0)
O2 SAT: 100 %
O2 Saturation: 100 %
PCO2 ART: 33 mmHg (ref 32.0–48.0)
PCO2 ART: 34.6 mmHg (ref 32.0–48.0)
PH ART: 7.466 — AB (ref 7.350–7.450)
Patient temperature: 34.5
Patient temperature: 35.6
Potassium: 4 mmol/L (ref 3.5–5.1)
Potassium: 3.8 mmol/L (ref 3.5–5.1)
Sodium: 143 mmol/L (ref 135–145)
Sodium: 144 mmol/L (ref 135–145)
TCO2: 26 mmol/L (ref 0–100)
TCO2: 24 mmol/L (ref 0–100)
pH, Arterial: 7.419 (ref 7.350–7.450)
pO2, Arterial: 263 mmHg — ABNORMAL HIGH (ref 83.0–108.0)
pO2, Arterial: 257 mmHg — ABNORMAL HIGH (ref 83.0–108.0)

## 2016-05-29 NOTE — Progress Notes (Signed)
Doing well. No headache. Up in chair. Ventriculostomy functioning. Denies headache. Denies any cranial neuropathy.  Afebrile. Still with intermittent low-grade fevers. Ventricle output steady at 10-15 mL/h. Awake and alert. Oriented and appropriate. Cranial nerve function intact. Wounds clean and dry. Chest and abdomen benign.  Status post posterior fossa meningioma resection. Continue external jugular drainage for now.

## 2016-05-29 NOTE — Progress Notes (Signed)
Patient ambulated in hallway.  Tolerated well.  Will continue to monitor patient.

## 2016-05-30 NOTE — Progress Notes (Signed)
Patient ID: Alexa [name], female   DOB: 1971-01-03, 45 y.o.   MRN: [id number]  BP 104/78   Pulse 68   Temp 99.2 F (37.3 C) (Oral)   Resp 17   Ht 5\' 8"  (1.727 m)   Wt 94.3 kg (208 lb)   LMP 04/27/2016   SpO2 100%   BMI 31.63 kg/m  Alert and oriented x 4, speech is clear and fluent.  Moving all extremities well Wound is clean,dry, and without signs of infection. Will clamp the ventricular catheter.

## 2016-05-30 NOTE — Progress Notes (Signed)
Visited with patient and family together, who appeared to be in relatively good spirits. Provided emotional support. Chaplain available for follow-up.   05/30/16 1600  Clinical Encounter Type  Visited With Patient and family together  Visit Type Initial  Referral From Chaplain  Spiritual Encounters  Spiritual Needs Emotional  Stress Factors  Patient Stress Factors Health changes  Family Stress Factors Family relationships

## 2016-05-30 NOTE — Anesthesia Postprocedure Evaluation (Signed)
Anesthesia Post Note  Patient: Alexa [name]  Procedure(s) Performed: Procedure(s): SUBOCCIPITAL CRANIOTOMY WITH PLACEMENT OF VENTRICULAR CATHETER  Patient location during evaluation: PACU Anesthesia Type: General Level of consciousness: awake and alert Pain management: pain level controlled Vital Signs Assessment: post-procedure vital signs reviewed and stable Respiratory status: spontaneous breathing, nonlabored ventilation, respiratory function stable and patient connected to nasal cannula oxygen Cardiovascular status: blood pressure returned to baseline and stable Postop Assessment: no signs of nausea or vomiting Anesthetic complications: no    Last Vitals:  Vitals:   05/30/16 0800 05/30/16 1200  BP: 130/83   Pulse: (!) 58   Resp:    Temp: 36.8 C 36.8 C    Last Pain:  Vitals:   05/30/16 1200  TempSrc: Oral  PainSc:                  Mahlon Gabrielle,W. EDMOND

## 2016-05-30 NOTE — Progress Notes (Signed)
Per shift report, loss of IV access. Dr. Christella Noa okay with not restarting access and will keep maintenance fluids off. Pts food and fluid intake is sufficient.

## 2016-05-31 MED ORDER — HYDROCODONE-ACETAMINOPHEN 5-325 MG PO TABS: 1.0000 | ORAL_TABLET | Freq: Four times a day (QID) | ORAL | 0 refills | Status: DC | PRN

## 2016-05-31 MED ORDER — PROMETHAZINE HCL 25 MG/ML IJ SOLN
12.5000 mg | Freq: Three times a day (TID) | INTRAMUSCULAR | Status: DC | PRN
  Administered 2016-05-31: 12.5 mg via INTRAVENOUS
  Filled 2016-05-31: qty 1

## 2016-05-31 NOTE — Progress Notes (Deleted)
Pt discharged home per Dr Christella Noa.

## 2016-05-31 NOTE — Discharge Instructions (Signed)
Craniotomy °Care After °Please read the instructions outlined below and refer to this sheet in the next few weeks. These discharge instructions provide you with general information on caring for yourself after you leave the hospital. Your surgeon may also give you specific instructions. While your treatment has been planned according to the most current medical practices available, unavoidable complications occasionally occur. If you have any problems or questions after discharge, please call your surgeon. °Although there are many types of brain surgery, recovery following craniotomy (surgical opening of the skull) is much the same for each. However, recovery depends on many factors. These include the type and severity of brain injury and the type of surgery. It also depends on any nervous system function problems (neurological deficits) before surgery. If the craniotomy was done for cancer, chemotherapy and radiation could follow. You could be in the hospital from 5 days to a couple weeks. This depends on the type of surgery, findings, and whether there are complications. °HOME CARE INSTRUCTIONS  °· It is not unusual to hear a clicking noise after a craniotomy, the plates and screws used to attach the bone flap can sometimes cause this. It is a normal occurrence if this does happen °· Do not drive for 10 days after the operation °· Your scalp may feel spongy for a while, because of fluid under it. This will gradually get better. Occasionally, the surgeon will not replace the bone that was removed to access the brain. If there is a bony defect, the surgeon will ask you to wear a helmet for protection. This is a discussion you should have with your surgeon prior to leaving the hospital (discharge). °· Numbness may persist in some areas of your scalp. °· Take all medications as directed. Sometimes steroids to control swelling are prescribed. Anticonvulsants to prevent seizures may also be given. Do not use alcohol,  other drugs, or medications unless your surgeon says it is OK. °· Keep the wound dry and clean. The wound may be washed gently with soap and water. Then, you may gently blot or dab it dry, without rubbing. Do not take baths, use swimming pools or hot tubs for 10 days, or as instructed by your caregiver. It is best to wait to see you surgeon at your first postoperative visit, and to get directions at that time. °· Only take over-the-counter or prescription medicines for pain, discomfort, or fever as directed by your caregiver. °· You may continue your normal diet, as directed. °· Walking is OK for exercise. Wait at least 3 months before you return to mild, non-contact sports or as your surgeon suggests. Contact sports should be avoided for at least 1 year, unless your surgeon says it is OK. °· If you are prescribed steroids, take them exactly as prescribed. If you start having a decrease in nervous system functions (neurological deficits) and headaches as the dose of steroids is reduced, tell your surgeon right away. °· When the anticonvulsant prescription is finished you no longer need to take it. °SEEK IMMEDIATE MEDICAL CARE IF:  °· You develop nausea, vomiting, severe headaches, confusion, or you have a seizure. °· You develop chest pain, a stiff neck, or difficulty breathing. °· There is redness, swelling, or increasing pain in the wound or pin insertion sites. °· You have an increase in swelling or bruising around the eyes. °· There is drainage or pus coming from the wound. °· You have an oral temperature above 102° F (38.9° C), not controlled by medicine. °·   You notice a foul smell coming from the wound or dressing. °· The wound breaks open (edges not staying together) after the stitches have been removed. °· You develop dizziness or fainting while standing. °· You develop a rash. °· You develop any reaction or side effects to the medications given. °Document Released: 10/18/2005 Document Revised: 10/10/2011  Document Reviewed: 07/27/2009 °[employer]® Patient Information ©2013 [employer]. ° °

## 2016-05-31 NOTE — Progress Notes (Signed)
Patient family called RN into room, patient vomited again 450cc clear/undigested food, Neurosurgery on call made aware, new orders received will continue to monitor.

## 2016-05-31 NOTE — Progress Notes (Signed)
Pt transferred to 3S07.

## 2016-05-31 NOTE — Progress Notes (Signed)
Patient transferred from [facility] with RN. Patient awake, alert, oriented, with no complaints of pain or nausea at this time. Neuro intact.

## 2016-05-31 NOTE — Discharge Summary (Signed)
Physician Discharge Summary  Patient ID: Alexa [name] MRN: [id number] DOB/AGE: 46-Dec-1972 46 y.o.  Admit date: 05/26/2016 Discharge date: 05/31/2016  Admission Diagnoses:Meningioma, posterior fossA  Discharge Diagnoses:  Active Problems:   Meningioma of cerebellum [facility])   Discharged Condition: good  Hospital Course: Alexa [name] was admitted and taken to the operating room for an uncomplicated sub total resection of a meningioma. She has done very well post op. I placed a ventricular drain in the operating room and let that drain for three days to assist with wound healing. Her wound at discharge is clean, dry, and without signs of infection. She is ambulating, voiding, and tolerating a regular diet.   Treatments: surgery: Sub occipital craniectomy for sub total tumor resection.   Discharge Exam: Blood pressure 99/64, pulse 67, temperature 98.6 F (37 C), temperature source Oral, resp. rate 13, height 5\' 8"  (1.727 m), weight 94.3 kg (208 lb), last menstrual period 04/27/2016, SpO2 100 %. General appearance: alert, cooperative, appears stated age and no distress Neurologic: Alert and oriented X 3, normal strength and tone. Normal symmetric reflexes. Normal coordination and gait  Disposition: Final discharge disposition not confirmed MENINGIOMA Discharge Instructions    AMB Referral to [place] [facility]    Complete by:  As directed    Please assign UMR member for post discharge call. Currently at [facility]. Please call with questions. Marthenia Rolling, [place], RN,BSN-THN [facility] [phone]   Reason for consult:  Please assign UMR member for post discharge call   Expected date of contact:  1-3 days (reserved for hospital discharges)       Medication List    STOP taking these medications   dexamethasone 2 MG tablet Commonly known as:  DECADRON     TAKE these medications   atenolol 50 MG tablet Commonly known as:  TENORMIN Take 50 mg by mouth  daily.   hydrochlorothiazide 12.5 MG tablet Commonly known as:  HYDRODIURIL Take 12.5 mg by mouth daily.   HYDROcodone-acetaminophen 5-325 MG tablet Commonly known as:  NORCO/VICODIN Take 1 tablet by mouth every 6 (six) hours as needed for moderate pain.   ibuprofen 200 MG tablet Commonly known as:  ADVIL,MOTRIN Take 200-400 mg by mouth 2 (two) times daily as needed for headache or moderate pain.   sertraline 100 MG tablet Commonly known as:  ZOLOFT Take 100 mg by mouth daily as needed (mood).      Follow-up Information    Hue Frick L, MD Follow up in 7 day(s).   Specialty:  Neurosurgery Why:  call to make an appointment for suture removal. Contact information: [address] [place] [address] [phone]           Signed: Winfield Cunas 05/31/2016, 6:14 PM

## 2016-06-01 ENCOUNTER — Other Ambulatory Visit: Payer: Self-pay | Admitting: *Deleted

## 2016-06-01 NOTE — Patient Outreach (Signed)
Alexa [name] [facility]) Care Management  06/01/2016  Alexa [name] Oct 12, 1970 [id number]   Subjective: Telephone call to patient's home number, no answer, left HIPAA compliant voicemail message, and requested call back.  Objective: Per chart review: Patient hospitalized 05/26/16 - 05/31/16 for Meningioma of cerebellum.   Status post SUBOCCIPITAL CRANIOTOMY for tumor resection  PLACEMENT OF right frontal VENTRICULAR CATHETER, via twist drill burr hole on 05/26/16.    Assessment: Received UMR Transition of care referral on 05/30/16.   Transition of care follow up pending patient contact.   Plan: RNCM will call patient for 2nd telephonic outreach attempt, within 10 business days, if no return call.    Alexa [name] H. Annia Friendly, BSN, [place] [facility] Telephonic CM Phone: [phone] Fax: [phone]

## 2016-06-03 ENCOUNTER — Ambulatory Visit: Payer: Self-pay | Admitting: *Deleted

## 2016-06-06 ENCOUNTER — Other Ambulatory Visit: Payer: Self-pay | Admitting: *Deleted

## 2016-06-06 ENCOUNTER — Ambulatory Visit: Payer: Self-pay | Admitting: *Deleted

## 2016-06-06 ENCOUNTER — Encounter: Payer: Self-pay | Admitting: *Deleted

## 2016-06-06 NOTE — Patient Outreach (Signed)
[facility]) Care Management  06/06/2016  Alexa [name] 07-Jul-1971 [id number]   Subjective: Telephone call to patient's home number, spoke with patient, and HIPAA verified.   Discussed [facility] Care Management UMR Transition of care follow up and Link to Wellness program.   Patient voices understanding and is in agreement to complete follow up. Patient states she is doing well, being assisted by parents and sister as needed.  States her discharge from the hospital on 05/31/16 was cancelled due to nausea, vomiting, and not discharged until 06/02/16.  Has a follow up appointment with surgeon on 06/10/16.   RNCM educated patient on the importance of hospital follow up with primary MD within 1- 2 weeks of discharge.   She voices understanding and states she is planning to call primary MD's office (Dr. Glendon Axe, [phone]) to schedule a follow up appointment.   Patient states she is ambulating with rolling walker and taking pain medication as needed.   States she is currently weaning pain medication and pain is being managed without difficulty. Patient states she has a history of hypertension, has a blood pressure cuff, takes blood pressure daily, and blood pressure was 120/80 on 06/06/16.   RNCM educated patient on [facility] pharmacy resources and provided patient with phone number for [facility] pharmacy [phone]).  Patient voices understanding, states she is very appreciative of the information, she will call the pharmacy,  and have her medication transferred to [facility].    Patient also given phone number for Link to Wellness program [phone]), and states she will call to set up an appointment for hypertension education when she has recovered from surgery.   Patient educated on hospital indemnity supplemental plan, voices understanding, states she has already filed a claim for hospitalization,  and is waiting for claim decision.   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 Patient states she does not have any transition of care, care coordination, disease management, disease monitoring, transportation, community resource, or pharmacy needs at this time.  Patient in agreement to receive successful outreach letter and has requested to be sent Link to [employer].    Objective: Per chart review: Patient hospitalized 05/26/16 - 05/31/16 for Meningioma of cerebellum.   Status post SUBOCCIPITAL CRANIOTOMY for tumor resection PLACEMENT OF right frontalVENTRICULAR CATHETER, via twist drill burr hole on 05/26/16.    Assessment: Received UMR Transition of care referral on 05/30/16. Transition of care follow up completed, no care management needs, and will proceed with case closure.   Plan: RNCM will send patient successful outreach letter and Link to [employer]. RNCM will send case closure due to follow up completed / no care management needs request to [facility].     Valentino Saavedra H. Annia Friendly, BSN, [place] Management [facility] Telephonic CM Phone: [phone] Fax: [phone]

## 2016-06-09 DIAGNOSIS — R42 Dizziness and giddiness: Secondary | ICD-10-CM | POA: Diagnosis not present

## 2016-06-09 DIAGNOSIS — D329 Benign neoplasm of meninges, unspecified: Secondary | ICD-10-CM | POA: Diagnosis not present

## 2016-06-09 DIAGNOSIS — R51 Headache: Secondary | ICD-10-CM | POA: Diagnosis not present

## 2016-06-09 DIAGNOSIS — R5383 Other fatigue: Secondary | ICD-10-CM | POA: Diagnosis not present

## 2016-06-10 ENCOUNTER — Other Ambulatory Visit: Payer: Self-pay | Admitting: *Deleted

## 2016-06-10 NOTE — Patient Outreach (Addendum)
[facility]) Care Management  06/10/2016  Alexa [name] [birth year] [id number]  Subjective:  Per caller ID, telephone call from patient and call disconnected.      Telephone call to patient, spoke with patient, and HIPAA verified.   States she missed her follow up appointment with surgeon's office on 06/07/16 for staple removal and wanted to alert this RNCM.   States she thought the appointment was today.   States she has notified the office via on-call service today and will call back to reschedule when office opens at 9:00am.   Patient voices understanding not to perform self removal staples.   She will also notify RNCM if assistance needed in rescheduling the appointment.   States no other transition of care needs at this time.   Objective:Per chart review: Patient hospitalized 05/26/16 - 05/31/16 for Meningioma of cerebellum. Status post SUBOCCIPITAL CRANIOTOMY for tumor resection PLACEMENT OF right frontalVENTRICULAR CATHETER, via twist drill burr hole on 05/26/16.   Assessment:Received UMR Transition of care referral on 05/30/16. Transition of care follow up completed, no care management needs, and case will remain closed.   Plan: Case will remain closed and patient will notify RNCM if assistance needed.      Karyn Brull H. Annia Friendly, BSN, [place] Management [facility] Telephonic CM Phone: [phone] Fax: [phone]

## 2016-07-15 DIAGNOSIS — Z114 Encounter for screening for human immunodeficiency virus [HIV]: Secondary | ICD-10-CM | POA: Diagnosis not present

## 2016-07-15 DIAGNOSIS — R51 Headache: Secondary | ICD-10-CM | POA: Diagnosis not present

## 2016-07-15 DIAGNOSIS — Z1389 Encounter for screening for other disorder: Secondary | ICD-10-CM | POA: Diagnosis not present

## 2016-07-15 DIAGNOSIS — D329 Benign neoplasm of meninges, unspecified: Secondary | ICD-10-CM | POA: Diagnosis not present

## 2016-07-15 DIAGNOSIS — Z0000 Encounter for general adult medical examination without abnormal findings: Secondary | ICD-10-CM | POA: Diagnosis not present

## 2016-07-15 DIAGNOSIS — R0602 Shortness of breath: Secondary | ICD-10-CM | POA: Diagnosis not present

## 2016-07-15 DIAGNOSIS — E559 Vitamin D deficiency, unspecified: Secondary | ICD-10-CM | POA: Diagnosis not present

## 2016-07-15 DIAGNOSIS — Z79899 Other long term (current) drug therapy: Secondary | ICD-10-CM | POA: Diagnosis not present

## 2016-08-05 ENCOUNTER — Other Ambulatory Visit (HOSPITAL_COMMUNITY): Payer: Self-pay | Admitting: Neurosurgery

## 2016-08-05 DIAGNOSIS — D329 Benign neoplasm of meninges, unspecified: Secondary | ICD-10-CM

## 2016-08-07 DIAGNOSIS — D509 Iron deficiency anemia, unspecified: Secondary | ICD-10-CM | POA: Diagnosis not present

## 2016-08-07 DIAGNOSIS — I10 Essential (primary) hypertension: Secondary | ICD-10-CM | POA: Diagnosis not present

## 2016-08-07 DIAGNOSIS — E559 Vitamin D deficiency, unspecified: Secondary | ICD-10-CM | POA: Diagnosis not present

## 2016-08-07 DIAGNOSIS — Z01419 Encounter for gynecological examination (general) (routine) without abnormal findings: Secondary | ICD-10-CM | POA: Diagnosis not present

## 2016-08-27 ENCOUNTER — Ambulatory Visit (HOSPITAL_COMMUNITY)
Admission: RE | Admit: 2016-08-27 | Discharge: 2016-08-27 | Disposition: A | Discharge status: Home / Self Care | Payer: 59 | Source: Ambulatory Visit | Attending: Neurosurgery | Admitting: Neurosurgery

## 2016-08-27 DIAGNOSIS — D329 Benign neoplasm of meninges, unspecified: Secondary | ICD-10-CM | POA: Diagnosis not present

## 2016-08-27 LAB — POCT I-STAT CREATININE: CREATININE: 0.8 mg/dL (ref 0.44–1.00)

## 2016-08-27 MED ORDER — GADOBENATE DIMEGLUMINE 529 MG/ML IV SOLN
19.0000 mL | Freq: Once | INTRAVENOUS | Status: AC | PRN
  Administered 2016-08-27: 19 mL via INTRAVENOUS

## 2016-09-13 DIAGNOSIS — E559 Vitamin D deficiency, unspecified: Secondary | ICD-10-CM | POA: Diagnosis not present

## 2016-09-13 DIAGNOSIS — D329 Benign neoplasm of meninges, unspecified: Secondary | ICD-10-CM | POA: Diagnosis not present

## 2016-09-13 DIAGNOSIS — I10 Essential (primary) hypertension: Secondary | ICD-10-CM | POA: Diagnosis not present

## 2016-09-13 DIAGNOSIS — D509 Iron deficiency anemia, unspecified: Secondary | ICD-10-CM | POA: Diagnosis not present

## 2016-10-24 MED FILL — ESCITALOPRAM 10 MG TABLET: 10 | 30 days supply | Qty: 30 | Fill #0

## 2016-11-02 DIAGNOSIS — R072 Precordial pain: Secondary | ICD-10-CM | POA: Diagnosis not present

## 2016-11-02 DIAGNOSIS — D539 Nutritional anemia, unspecified: Secondary | ICD-10-CM | POA: Diagnosis not present

## 2016-11-02 DIAGNOSIS — E559 Vitamin D deficiency, unspecified: Secondary | ICD-10-CM | POA: Diagnosis not present

## 2016-11-02 DIAGNOSIS — Z79899 Other long term (current) drug therapy: Secondary | ICD-10-CM | POA: Diagnosis not present

## 2016-11-02 DIAGNOSIS — F329 Major depressive disorder, single episode, unspecified: Secondary | ICD-10-CM | POA: Diagnosis not present

## 2016-11-02 DIAGNOSIS — R002 Palpitations: Secondary | ICD-10-CM | POA: Diagnosis not present

## 2016-11-02 DIAGNOSIS — E669 Obesity, unspecified: Secondary | ICD-10-CM | POA: Diagnosis not present

## 2016-11-02 DIAGNOSIS — F419 Anxiety disorder, unspecified: Secondary | ICD-10-CM | POA: Diagnosis not present

## 2016-11-02 MED FILL — ESCITALOPRAM 20 MG TABLET: 20 | 30 days supply | Qty: 30 | Fill #0

## 2016-12-08 DIAGNOSIS — R072 Precordial pain: Secondary | ICD-10-CM | POA: Diagnosis not present

## 2016-12-12 ENCOUNTER — Ambulatory Visit (HOSPITAL_COMMUNITY): Payer: 59 | Admitting: Licensed Clinical Social Worker

## 2016-12-16 DIAGNOSIS — I10 Essential (primary) hypertension: Secondary | ICD-10-CM | POA: Diagnosis not present

## 2016-12-16 DIAGNOSIS — R072 Precordial pain: Secondary | ICD-10-CM | POA: Diagnosis not present

## 2016-12-16 DIAGNOSIS — R002 Palpitations: Secondary | ICD-10-CM | POA: Diagnosis not present

## 2016-12-23 ENCOUNTER — Ambulatory Visit (INDEPENDENT_AMBULATORY_CARE_PROVIDER_SITE_OTHER): Payer: 59 | Admitting: Licensed Clinical Social Worker

## 2016-12-23 DIAGNOSIS — F4010 Social phobia, unspecified: Secondary | ICD-10-CM

## 2016-12-23 DIAGNOSIS — F332 Major depressive disorder, recurrent severe without psychotic features: Secondary | ICD-10-CM | POA: Diagnosis not present

## 2016-12-25 ENCOUNTER — Encounter (HOSPITAL_COMMUNITY): Payer: Self-pay | Admitting: Licensed Clinical Social Worker

## 2016-12-25 DIAGNOSIS — F332 Major depressive disorder, recurrent severe without psychotic features: Secondary | ICD-10-CM | POA: Insufficient documentation

## 2016-12-25 DIAGNOSIS — F4010 Social phobia, unspecified: Secondary | ICD-10-CM | POA: Insufficient documentation

## 2016-12-25 NOTE — Progress Notes (Signed)
Comprehensive Clinical Assessment (CCA) Note  12/25/2016 [name] [id number]  Visit Diagnosis:      ICD-9-CM ICD-10-CM   1. Severe episode of recurrent major depressive disorder, without psychotic features ([place]) 296.33 F33.2   2. Social anxiety disorder 300.23 F40.10       CCA Part One  Part One has been completed on paper by the patient.  (See scanned document in Chart Review)  CCA Part Two A  Intake/Chief Complaint:  CCA Intake With Chief Complaint CCA Part Two Date: 12/23/16 CCA Part Two Time: 0904 Chief Complaint/Presenting Problem: "I want to make sure my medications are being managed well.  My depression is back.  I also have social phobia." Patients Currently Reported Symptoms/Problems: Reports "Most of the time I feel like I'm walking around with a cloud over my head."  Concerned about how her social anxiety has interfered with her goals for maintaining friendships.  Experiencing a lot of fatigue.  Reports "I'm wanting to sleep all the time."      Individual's Strengths: Enjoys working as an Corporate treasurer.  Family is a source of support.  Has a close friend who lives in [place].   Individual's Preferences: Wants to have a better social life.   Type of Services Patient Feels Are Needed: Medication management and therapy Initial Clinical Notes/Concerns: Reports that she has had episodes of depression at other times in her life.  Has tried several different medications including Paxil, Zoloft, and currently Lexapro.  Last saw a therapist in her early 67s.    Has had panic attacks before.    Mental Health Symptoms Depression:  Depression: Difficulty Concentrating, Fatigue, Hopelessness, Sleep (too much or little), Tearfulness, Worthlessness, Change in energy/activity  Mania:  Mania: N/A  Anxiety:   Anxiety: Worrying, Tension, Fatigue, Difficulty concentrating  Psychosis:  Psychosis: N/A  Trauma:  Trauma: N/A  Obsessions:  Obsessions: N/A  Compulsions:  Compulsions: N/A   Inattention:  Inattention: N/A  Hyperactivity/Impulsivity:  Hyperactivity/Impulsivity: N/A  Oppositional/Defiant Behaviors:  Oppositional/Defiant Behaviors: N/A  Borderline Personality:  Emotional Irregularity: N/A  Other Mood/Personality Symptoms:      Mental Status Exam Appearance and self-care  Stature:  Stature: Average  Weight:  Weight: Overweight  Clothing:  Clothing: Casual  Grooming:  Grooming: Normal  Cosmetic use:  Cosmetic Use: Age appropriate  Posture/gait:  Posture/Gait: Normal  Motor activity:  Motor Activity: Not Remarkable  Sensorium  Attention:  Attention: Normal  Concentration:  Concentration: Normal  Orientation:  Orientation: X5  Recall/memory:  Recall/Memory: Normal  Affect and Mood  Affect:  Affect: Appropriate  Mood:  Mood: Anxious, Depressed  Relating  Eye contact:  Eye Contact: Normal  Facial expression:  Facial Expression: Responsive  Attitude toward examiner:  Attitude Toward Examiner: Cooperative  Thought and Language  Speech flow: Speech Flow: Normal  Thought content:     Preoccupation:     Hallucinations:     Organization:     Transport planner of Knowledge:  Fund of Knowledge: Average  Intelligence:  Intelligence: Average  Abstraction:     Judgement:  Judgement: Fair (Expresses dissatisfaction with decisions she has made in life.)  Reality Testing:  Reality Testing: Adequate  Insight:  Insight: Fair  Decision Making:  Decision Making: Vacilates  Social Functioning  Social Maturity:  Social Maturity: Isolates ("My social life is non-existant."  Does want to be more social though.)  Social Judgement:  Social Judgement: Normal  Stress  Stressors:  Stressors: Illness (Had a craniotomy in November.  She had a tumor.   Great Aunt living in the house has dementia.)  Coping Ability:  Coping Ability: Exhausted  Skill Deficits:     Supports:      Family and Psychosocial History: Family history Marital status: Single (Has never been in  a long term relationship.) What is your sexual orientation?: heterosexual Does patient have children?: No  Childhood History:  Childhood History By whom was/is the patient raised?: Both parents Additional childhood history information: Born in [place].  Both parents worked.   Description of patient's relationship with caregiver when they were a child: Mom was critical, but generally they had good relationship   Relationship with dad was good.  He has had issues with depression.   Patient's description of current relationship with people who raised him/her: Living with her parents for the past 2 years  Relationship with mom is "ok"  Relationship with dad is good.   How were you disciplined when you got in trouble as a child/adolescent?: Took away privileges Does patient have siblings?: Yes Number of Siblings: 3 Description of patient's current relationship with siblings: She is the oldest.  Sister, Museum/gallery conservator (41)-good relationship, she has 3 children and is married   Brother, Larkin Ina (36)-not too close, he has a daughter   Sister, Janett Billow (30)-lives in the household, not financially able to move out, relationship is "ok" Did patient suffer any verbal/emotional/physical/sexual abuse as a child?: No Did patient suffer from severe childhood neglect?: No Has patient ever been sexually abused/assaulted/raped as an adolescent or adult?: No Was the patient ever a victim of a crime or a disaster?: No Witnessed domestic violence?: No Has patient been effected by domestic violence as an adult?: No  CCA Part Two B  Employment/Work Situation: Employment / Work Situation Employment situation: Employed Where is patient currently employed?: [employer]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 How long has patient been employed?: almost a year Patient's job has been impacted by current illness: No What is the longest time patient has a held a job?: 10 years Where was the patient employed at that time?:  Worked for a pulmonologist in [place] Has patient ever been in the [employer]?: No Are There Guns or Other Weapons in [place]?: No  Education: Education Did Teacher, adult education From [employer]?: Yes Did Physicist, medical?: Yes What Type of College Degree Do you Have?: LPN Did You Have Any Difficulty At [employer]?: Yes (Her social anxiety made it hard for her to concentrate.  Had poor self-esteem) Were Any Medications Ever Prescribed For These Difficulties?: No  Religion: Religion/Spirituality Are You A Religious Person?: Yes (Her faith has helped her to cope with her MH issues.  Attends meetings regularly.) What is Your Religious Affiliation?: Jehovah's Witness  Leisure/Recreation: Leisure / Recreation Leisure and Hobbies: Used to enjoy drawing.  Likes to travel.  "Now I do nothing.  I go to sleep early...like at 8pm"    Exercise/Diet: Exercise/Diet Do You Exercise?: Yes What Type of Exercise Do You Do?: Run/Walk Have You Gained or Lost A Significant Amount of Weight in the Past Six Months?: Yes-Gained Number of Pounds Gained: 15 Do You Follow a Special Diet?: No Do You Have Any Trouble Sleeping?: Yes Explanation of Sleeping Difficulties: sometimes has insomnia, but lately sleeping too much  CCA Part Two C  Alcohol/Drug Use: Alcohol / Drug Use History of alcohol / drug use?: No history of alcohol / drug abuse  CCA Part Three  ASAM's:  Six Dimensions of Multidimensional Assessment  Dimension 1:  Acute Intoxication and/or Withdrawal Potential:     Dimension 2:  Biomedical Conditions and Complications:     Dimension 3:  Emotional, Behavioral, or Cognitive Conditions and Complications:     Dimension 4:  Readiness to Change:     Dimension 5:  Relapse, Continued use, or Continued Problem Potential:     Dimension 6:  Recovery/Living Environment:      Substance use Disorder (SUD)    Social Function:  Social Functioning Social Maturity: Isolates ("My  social life is non-existant."  Does want to be more social though.) Social Judgement: Normal  Stress:  Stress Stressors: Illness (Had a craniotomy in November.  She had a tumor.   Great Aunt living in the house has dementia.) Coping Ability: Exhausted Patient Takes Medications The Way The Doctor Instructed?: Yes  Risk Assessment- Self-Harm Potential: Risk Assessment For Self-Harm Potential Thoughts of Self-Harm:  (Has thoughts she would be better off dead.) Method: No plan Additional Comments for Self-Harm Potential: Denies history of self-harm  Risk Assessment -Dangerous to Others Potential: Risk Assessment For Dangerous to Others Potential Method: No Plan Additional Comments for Danger to Others Potential: Denies history of harm to others  DSM5 Diagnoses: Patient Active Problem List   Diagnosis Date Noted  . Social anxiety disorder 12/25/2016  . Severe episode of recurrent major depressive disorder, without psychotic features ([place]) 12/25/2016  . Meningioma of cerebellum ([place]) 05/26/2016      Recommendations for Services/Supports/Treatments: Recommendations for Services/Supports/Treatments Recommendations For Services/Supports/Treatments: Individual Therapy, Medication Management  Therapist informed patient of the opportunity to take advantage of free counseling offered through [facility].  Patient is undecided about whether she will schedule a follow up therapy appointment here or with an [place] therapist.  Meanwhile she has made an appointment to see our psychiatrist.     Garnette Scheuermann

## 2017-01-11 MED FILL — ESCITALOPRAM 20 MG TABLET: 20 | 30 days supply | Qty: 30 | Fill #1

## 2017-01-27 ENCOUNTER — Ambulatory Visit (HOSPITAL_COMMUNITY): Payer: Self-pay | Admitting: Psychiatry

## 2017-02-07 ENCOUNTER — Ambulatory Visit (HOSPITAL_COMMUNITY): Payer: Self-pay | Admitting: Psychiatry

## 2017-02-07 ENCOUNTER — Encounter (HOSPITAL_COMMUNITY): Payer: Self-pay | Admitting: Psychiatry

## 2017-02-07 ENCOUNTER — Ambulatory Visit (INDEPENDENT_AMBULATORY_CARE_PROVIDER_SITE_OTHER): Payer: 59 | Admitting: Psychiatry

## 2017-02-07 VITALS — BP 126/80 | HR 80 | Resp 16 | Ht 67.0 in | Wt 215.0 lb

## 2017-02-07 DIAGNOSIS — F4010 Social phobia, unspecified: Secondary | ICD-10-CM | POA: Diagnosis not present

## 2017-02-07 DIAGNOSIS — D320 Benign neoplasm of cerebral meninges: Secondary | ICD-10-CM

## 2017-02-07 DIAGNOSIS — Z818 Family history of other mental and behavioral disorders: Secondary | ICD-10-CM | POA: Diagnosis not present

## 2017-02-07 DIAGNOSIS — F411 Generalized anxiety disorder: Secondary | ICD-10-CM

## 2017-02-07 DIAGNOSIS — F332 Major depressive disorder, recurrent severe without psychotic features: Secondary | ICD-10-CM

## 2017-02-07 MED ORDER — BUPROPION HCL ER (SR) 100 MG PO TB12: 100.0000 mg | ORAL_TABLET | Freq: Every day | ORAL | 0 refills | Status: DC

## 2017-02-07 MED FILL — BUPROPION HCL SR 100 MG TAB: 100 | 30 days supply | Qty: 30 | Fill #0

## 2017-02-07 NOTE — Progress Notes (Signed)
Psychiatric Initial Adult Assessment   Patient Identification: Alexa [name] MRN:  [id number] Date of Evaluation:  02/07/2017 Referral Source: Counsellor Chief Complaint:   Chief Complaint    Establish Care     Visit Diagnosis:    ICD-10-CM   1. Severe episode of recurrent major depressive disorder, without psychotic features ([place]) F33.2   2. Social anxiety disorder F40.10   3. Meningioma of cerebellum (HCC)Chronic D32.0     History of Present Illness:  46 years old currently single African-American female referred by our counselor for management of depression and anxiety  Patient states to suffer from chronic depression since early age and also anxiety including social anxiety that she experienced or started experiencing when she was in high school she would avoid crowds or with cafeteria avoid meeting new people and would feel as if people are looking at her or judging her. She has had brain meningioma that was taken out last year. She felt maybe that is the reason of her chronic depression but even after that there was improved. For a while but then she started feeling down depressed or hopeless again she gets teary easily she has decreased motivation does not have too many activities she works as a Marine scientist but at home there is not much going on. Does not have any relationship she moved from [place] after 20 years and is currently living with her parents which are supportive  She considers herself a failure  Aggravating factors. Considers herself a failure. Brain tumor. Social anxiety Modifying factors her family Severity of depression: 3/10. 10 being no depression Context: feels failure.  Duration: most of her adult life    Associated Signs/Symptoms: Depression Symptoms:  anhedonia, hopelessness, anxiety, loss of energy/fatigue, (Hypo) Manic Symptoms:  Distractibility, Anxiety Symptoms:  Excessive Worry, Psychotic Symptoms:  denies PTSD Symptoms: NA  Past Psychiatric  History: SSRI treatment for depression and anxiety  Previous Psychotropic Medications: Yes   Substance Abuse History in the last 12 months:  No.  Consequences of Substance Abuse: NA  Past Medical History:  Past Medical History:  Diagnosis Date  . Anemia   . Anxiety   . GERD (gastroesophageal reflux disease)   . Headache   . Hypertension     Past Surgical History:  Procedure Laterality Date  . CRANIOTOMY  05/26/2016   Procedure: SUBOCCIPITAL CRANIOTOMY WITH PLACEMENT OF VENTRICULAR CATHETER;  Surgeon: Ashok Pall, MD;  Location: [facility];  Service: Neurosurgery;;  [place]  . WISDOM TOOTH EXTRACTION      Family Psychiatric History: father side : depression most family members  Family History:  Family History  Problem Relation Age of Onset  . Hypertension Mother   . Heart disease Mother   . Hypertension Father   . Kidney disease Father     Social History:   Social History   Social History  . Marital status: Single    Spouse name: N/A  . Number of children: N/A  . Years of education: N/A   Social History Main Topics  . Smoking status: Never Smoker  . Smokeless tobacco: Never Used  . Alcohol use Yes     Comment: socially  . Drug use: No  . Sexual activity: Yes   Other Topics Concern  . None   Social History Narrative  . None    Additional Social History: Grew up with her parents know, but she felt uncomfortable high school also experienced or started experiencing anxiety she hasn't had siblings. She has finished her  education currently working as an Corporate treasurer with [employer] Not in relationship, avoids it. No kids   Allergies:  No Known Allergies  Metabolic Disorder Labs: No results found for: HGBA1C, MPG No results found for: PROLACTIN No results found for: CHOL, TRIG, HDL, CHOLHDL, VLDL, LDLCALC   Current Medications: Current Outpatient Prescriptions  Medication Sig Dispense Refill  . atenolol (TENORMIN) 50 MG tablet Take 50 mg by  mouth daily.    Marland Kitchen escitalopram (LEXAPRO) 20 MG tablet   2  . hydrochlorothiazide (HYDRODIURIL) 12.5 MG tablet Take 12.5 mg by mouth daily.    Marland Kitchen ibuprofen (ADVIL,MOTRIN) 200 MG tablet Take 200-400 mg by mouth 2 (two) times daily as needed for headache or moderate pain.    Marland Kitchen buPROPion (WELLBUTRIN SR) 100 MG 12 hr tablet Take 1 tablet (100 mg total) by mouth daily. 30 tablet 0   No current facility-administered medications for this visit.     Neurologic: Headache: No Seizure: No Paresthesias:No  Musculoskeletal: Strength & Muscle Tone: within normal limits Gait & Station: normal Patient leans: no lean  Psychiatric Specialty Exam: Review of Systems  Cardiovascular: Negative for chest pain.  Skin: Negative for rash.  Psychiatric/Behavioral: Positive for depression. Negative for substance abuse. The patient is nervous/anxious.     Blood pressure 126/80, pulse 80, resp. rate 16, height 5\' 7"  (1.702 m), weight 215 lb (97.5 kg), last menstrual period 01/24/2017, SpO2 98 %.Body mass index is 33.67 kg/m.  General Appearance: Casual  Eye Contact:  Fair  Speech:  Normal Rate  Volume:  Decreased  Mood:  Depressed  Affect:  Congruent and Constricted  Thought Process:  Goal Directed  Orientation:  Full (Time, Place, and Person)  Thought Content:  Rumination  Suicidal Thoughts:  No  Homicidal Thoughts:  No  Memory:  Immediate;   Fair Recent;   Fair  Judgement:  Fair  Insight:  Shallow  Psychomotor Activity:  Decreased  Concentration:  Concentration: Fair and Attention Span: Fair  Recall:  [employer] of Knowledge:Fair  Language: Fair  Akathisia:  Negative  Handed:  Right  AIMS (if indicated):    Assets:  Social Support  ADL's:  Intact  Cognition: WNL  Sleep:  fair    Treatment Plan Summary: Medication management and Plan as follows  1. Major depression severe: continue lexapro. Add wellbutrin 100mg   For now. Call back if concerns Consider to continue therapy to work on coping  skills and self esteem  2. SOcial anxiety: ongoing. Continue lexapro. Consider therapy 3. GAD: lexapro as above  Increase activities to improve self-esteem in his activities including work, [employer] meditation or some common interest or hobbies that will help her to distract from her worries and depression. Patient is not having any suicidal plan she does feel hopeless at times she does have a good supportive family and says that she would not do any SELF-destructive behavior because she has a family or parents  FU 3-4 weeks or earlier if needed   Merian Capron, MD 7/10/20189:41 AM

## 2017-02-21 ENCOUNTER — Ambulatory Visit (HOSPITAL_COMMUNITY): Payer: Self-pay | Admitting: Psychiatry

## 2017-03-02 ENCOUNTER — Ambulatory Visit (HOSPITAL_COMMUNITY): Payer: 59 | Admitting: Psychiatry

## 2017-03-06 MED FILL — HYDROCHLOROTHIAZIDE 12.5 MG: 12.5 | 90 days supply | Qty: 90 | Fill #0

## 2017-03-06 MED FILL — ESCITALOPRAM 20 MG TABLET: 20 | 30 days supply | Qty: 30 | Fill #2

## 2017-03-06 MED FILL — ESCITALOPRAM 10 MG TABLET: 10 | 30 days supply | Qty: 30 | Fill #1

## 2017-03-06 MED FILL — VIT D2 1.25 MG (50,000 UNIT: 1.25 MG | 28 days supply | Qty: 4 | Fill #0

## 2017-03-20 ENCOUNTER — Other Ambulatory Visit (HOSPITAL_COMMUNITY): Payer: Self-pay | Admitting: Psychiatry

## 2017-03-21 MED FILL — ATENOLOL 50 MG TABLET: 50 | 90 days supply | Qty: 90 | Fill #0

## 2017-03-23 MED FILL — BUPROPION HCL SR 100 MG TAB: 100 | 7 days supply | Qty: 7 | Fill #0

## 2017-03-23 NOTE — Telephone Encounter (Signed)
Medication refill- received fax from [facility] requesting a refill for Wellbutrin. Per Dr. De Nurse, refill is authorize for Wellbutrin 100mg , #7. Rx was sent to pharmacy. Pt has an apt schedule on 03/27/17. Called and informed pt of refill status. Pt is aware refill is for 7 days only, no more refills will be issue until seen by provider. Pt verbalizes understanding.

## 2017-03-27 ENCOUNTER — Ambulatory Visit (INDEPENDENT_AMBULATORY_CARE_PROVIDER_SITE_OTHER): Payer: 59 | Admitting: Psychiatry

## 2017-03-27 ENCOUNTER — Encounter (HOSPITAL_COMMUNITY): Payer: Self-pay | Admitting: Psychiatry

## 2017-03-27 DIAGNOSIS — F332 Major depressive disorder, recurrent severe without psychotic features: Secondary | ICD-10-CM | POA: Diagnosis not present

## 2017-03-27 DIAGNOSIS — F411 Generalized anxiety disorder: Secondary | ICD-10-CM | POA: Diagnosis not present

## 2017-03-27 DIAGNOSIS — F4010 Social phobia, unspecified: Secondary | ICD-10-CM | POA: Diagnosis not present

## 2017-03-27 DIAGNOSIS — D320 Benign neoplasm of cerebral meninges: Secondary | ICD-10-CM

## 2017-03-27 DIAGNOSIS — Z85841 Personal history of malignant neoplasm of brain: Secondary | ICD-10-CM | POA: Diagnosis not present

## 2017-03-27 DIAGNOSIS — Z818 Family history of other mental and behavioral disorders: Secondary | ICD-10-CM | POA: Diagnosis not present

## 2017-03-27 MED ORDER — ESCITALOPRAM OXALATE 20 MG PO TABS: 20.0000 mg | ORAL_TABLET | Freq: Every day | ORAL | 1 refills | Status: DC

## 2017-03-27 MED ORDER — BUPROPION HCL ER (SR) 100 MG PO TB12: 100.0000 mg | ORAL_TABLET | Freq: Every day | ORAL | 2 refills | Status: DC

## 2017-03-27 NOTE — Progress Notes (Signed)
[facility] Outpatient Follow up visit  Patient Identification: Alexa [name] MRN:  [id number] Date of Evaluation:  03/27/2017 Referral Source: Counsellor Chief Complaint:   Chief Complaint    Follow-up     Visit Diagnosis:    ICD-10-CM   1. Severe episode of recurrent major depressive disorder, without psychotic features ([place]) F33.2   2. 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 She has had brain meningioma that was taken out last year. She felt maybe that is the reason of her chronic depression but even after that there was improved. For a while but then she started feeling down depressed again.   Last visit we added Wellbutrin continue the Lexapro she is doing better more motivated her family members of mention to her that she is doing better she is trying to do her part and keeping herself more positive and her colleagues are helping  She's not considering herself a failure as she has before.   Aggravating factors. Brain tumor. Social anxiety Modifying factors : family Severity of depression: 6/10. Improved.  Context: feels failure.  Duration: most of her adult life    Past Psychiatric History: SSRI treatment for depression and anxiety  Previous Psychotropic Medications: Yes   Substance Abuse History in the last 12 months:  No.  Consequences of Substance Abuse: NA  Past Medical History:  Past Medical History:  Diagnosis Date  . Anemia   . Anxiety   . GERD (gastroesophageal reflux disease)   . Headache   .  Hypertension     Past Surgical History:  Procedure Laterality Date  . CRANIOTOMY  05/26/2016   Procedure: SUBOCCIPITAL CRANIOTOMY WITH PLACEMENT OF VENTRICULAR CATHETER;  Surgeon: Ashok Pall, MD;  Location: [facility];  Service: Neurosurgery;;  [place]  . WISDOM TOOTH EXTRACTION      Family Psychiatric History: father side : depression most family members  Family History:  Family History  Problem Relation Age of Onset  . Hypertension Mother   . Heart disease Mother   . Hypertension Father   . Kidney disease Father     Social History:   Social History   Social History  . Marital status: Single    Spouse name: N/A  . Number of children: N/A  . Years of education: N/A   Social History Main Topics  . Smoking status: Never Smoker  . Smokeless tobacco: Never Used  . Alcohol use Yes     Comment: socially  . Drug use: No  . Sexual activity: Yes   Other Topics Concern  . None   Social History Narrative  . None      Allergies:  No Known Allergies  Metabolic Disorder Labs: No results found for: HGBA1C, MPG No results found for: PROLACTIN No results found for: CHOL, TRIG, HDL, CHOLHDL, VLDL, LDLCALC   Current Medications: Current Outpatient Prescriptions  Medication Sig Dispense Refill  . atenolol (TENORMIN) 50 MG tablet Take 50 mg by mouth daily.    Marland Kitchen buPROPion (WELLBUTRIN SR) 100 MG 12 hr tablet Take 1 tablet (  100 mg total) by mouth daily. 30 tablet 2  . escitalopram (LEXAPRO) 20 MG tablet Take 1 tablet (20 mg total) by mouth daily. 30 tablet 1  . hydrochlorothiazide (HYDRODIURIL) 12.5 MG tablet Take 12.5 mg by mouth daily.    Marland Kitchen ibuprofen (ADVIL,MOTRIN) 200 MG tablet Take 200-400 mg by mouth 2 (two) times daily as needed for headache or moderate pain.     No current facility-administered medications for this visit.       Psychiatric Specialty Exam: Review of Systems  Cardiovascular: Negative for palpitations.  Skin: Negative for rash.   Psychiatric/Behavioral: Negative for substance abuse.    There were no vitals taken for this visit.There is no height or weight on file to calculate BMI.  General Appearance: Casual  Eye Contact:  Fair  Speech:  Normal Rate  Volume:  Decreased  Mood:  Improved. Less depressed  Affect:  More reactive  Thought Process:  Goal Directed  Orientation:  Full (Time, Place, and Person)  Thought Content:  Rumination  Suicidal Thoughts:  No  Homicidal Thoughts:  No  Memory:  Immediate;   Fair Recent;   Fair  Judgement:  Fair  Insight:  Shallow  Psychomotor Activity:  Decreased  Concentration:  Concentration: Fair and Attention Span: Fair  Recall:  [employer] of Knowledge:Fair  Language: Fair  Akathisia:  Negative  Handed:  Right  AIMS (if indicated):    Assets:  Social Support  ADL's:  Intact  Cognition: WNL  Sleep:  fair    Treatment Plan Summary: Medication management and Plan as follows  1. Major depression recurrent : improved. Continue wellbutrin . May increase further if needed.  2. SOcial anxiety: less continue lexapro 3. GAD: not worse. Continue lexapro Prescriptions sent discussed side effects. Follow-up in 2-3 months by mouth she is trying to be more positive that has helped her depression as well  FU 2-3 months or earlier if needed  Merian Capron, MD 8/27/20184:43 PM

## 2017-03-28 ENCOUNTER — Telehealth (HOSPITAL_COMMUNITY): Payer: Self-pay | Admitting: Psychiatry

## 2017-03-28 MED ORDER — BUPROPION HCL ER (SR) 100 MG PO TB12: 100.0000 mg | ORAL_TABLET | Freq: Every day | ORAL | 2 refills | Status: DC

## 2017-03-28 MED FILL — BUPROPION HCL SR 100 MG TAB: 100 | 30 days supply | Qty: 30 | Fill #0

## 2017-03-28 NOTE — Telephone Encounter (Signed)
Medication management- pt was seen in clinic on 03/27/17. Pt's Wellbutrin rx was sent to [facility]. Per Pt, pt would rx's sent to [facility].  Per Dr. De Nurse, Called and spoke with Gerald Stabs at [facility]. Wellbutrin rx cancelled. Nex rx was sent to [facility] for Wellbutrin 100mg , #30 w/ 2 refills. Nothing further is need at this time.

## 2017-03-28 NOTE — Telephone Encounter (Signed)
wellbutrin rx was sent to [facility]. It should have been sent to [facility].   Please re-send

## 2017-06-08 MED FILL — BUPROPION HCL SR 100 MG TAB: 100 | 30 days supply | Qty: 30 | Fill #1

## 2017-06-08 MED FILL — ESCITALOPRAM 20 MG TABLET: 20 | 30 days supply | Qty: 30 | Fill #0

## 2017-06-20 ENCOUNTER — Ambulatory Visit (HOSPITAL_COMMUNITY): Payer: Self-pay | Admitting: Psychiatry

## 2017-07-20 ENCOUNTER — Other Ambulatory Visit: Payer: Self-pay | Admitting: Family Medicine

## 2017-07-20 DIAGNOSIS — Z139 Encounter for screening, unspecified: Secondary | ICD-10-CM

## 2017-07-26 MED FILL — HYDROCHLOROTHIAZIDE 12.5 MG: 12.5 | 90 days supply | Qty: 90 | Fill #1

## 2017-08-09 MED FILL — ATENOLOL 50 MG TABLET: 50 | 30 days supply | Qty: 30 | Fill #0

## 2017-08-16 MED FILL — BUPROPION HCL SR 100 MG TAB: 100 | 30 days supply | Qty: 30 | Fill #2

## 2017-08-16 MED FILL — ESCITALOPRAM 20 MG TABLET: 20 | 30 days supply | Qty: 30 | Fill #1

## 2017-08-21 ENCOUNTER — Ambulatory Visit (HOSPITAL_COMMUNITY): Payer: Self-pay | Admitting: Psychiatry

## 2017-08-22 ENCOUNTER — Encounter (HOSPITAL_COMMUNITY): Payer: Self-pay | Admitting: Psychiatry

## 2017-08-22 ENCOUNTER — Ambulatory Visit (INDEPENDENT_AMBULATORY_CARE_PROVIDER_SITE_OTHER): Payer: No Typology Code available for payment source | Admitting: Psychiatry

## 2017-08-22 ENCOUNTER — Other Ambulatory Visit: Payer: Self-pay

## 2017-08-22 VITALS — BP 122/88 | HR 74 | Ht 67.0 in | Wt 232.0 lb

## 2017-08-22 DIAGNOSIS — F332 Major depressive disorder, recurrent severe without psychotic features: Secondary | ICD-10-CM | POA: Diagnosis not present

## 2017-08-22 DIAGNOSIS — F4010 Social phobia, unspecified: Secondary | ICD-10-CM | POA: Diagnosis not present

## 2017-08-22 DIAGNOSIS — Z818 Family history of other mental and behavioral disorders: Secondary | ICD-10-CM | POA: Diagnosis not present

## 2017-08-22 DIAGNOSIS — D320 Benign neoplasm of cerebral meninges: Secondary | ICD-10-CM

## 2017-08-22 MED ORDER — BUPROPION HCL ER (SR) 150 MG PO TB12: 150.0000 mg | ORAL_TABLET | Freq: Every day | ORAL | 1 refills | Status: DC

## 2017-08-22 MED ORDER — ESCITALOPRAM OXALATE 20 MG PO TABS: 20.0000 mg | ORAL_TABLET | Freq: Every day | ORAL | 0 refills | Status: DC

## 2017-08-22 NOTE — Progress Notes (Signed)
[facility] Outpatient Follow up visit  Patient Identification: Alexa [name] MRN:  [id number] Date of Evaluation:  08/22/2017 Referral Source: Counsellor Chief Complaint:   Chief Complaint    Follow-up; Other     Visit Diagnosis:    ICD-10-CM   1. Severe episode of recurrent major depressive disorder, without psychotic features ([place]) F33.2   2. Meningioma of cerebellum ([place]) D32.0   3. Social anxiety disorder F40.10     History of Present Illness:  47 years old currently single African-American female initially referred by our counselor for management of depression and anxiety  Patient states to suffer from chronic depression since early age and also anxiety including social anxiety   Feeling down, tired.  Lives with parents.  Motivation low. Works as Marine scientist    Aggravating factors. Brain tumor. Social anxiety Modifying factors : family Severity of depression: somewhat worse . 5/10 Context: amotivation Duration: most of her adult life    Past Psychiatric History: SSRI treatment for depression and anxiety  Previous Psychotropic Medications: Yes   Substance Abuse History in the last 12 months:  No.  Consequences of Substance Abuse: NA  Past Medical History:  Past Medical History:  Diagnosis Date  . Anemia   . Anxiety   . GERD (gastroesophageal reflux disease)   . Headache   . Hypertension     Past Surgical History:  Procedure Laterality Date  . CRANIOTOMY  05/26/2016   Procedure: SUBOCCIPITAL CRANIOTOMY WITH PLACEMENT OF VENTRICULAR CATHETER;  Surgeon: Ashok Pall, MD;  Location: [facility];  Service: Neurosurgery;;  [place]  . WISDOM TOOTH EXTRACTION      Family Psychiatric History: father side : depression most family members  Family History:  Family History  Problem Relation Age of Onset  . Hypertension Mother   . Heart disease Mother   . Hypertension Father   . Kidney disease Father     Social History:   Social History    Socioeconomic History  . Marital status: Single    Spouse name: None  . Number of children: None  . Years of education: None  . Highest education level: None  Social Needs  . Financial resource strain: None  . Food insecurity - worry: None  . Food insecurity - inability: None  . Transportation needs - medical: None  . Transportation needs - non-medical: None  Occupational History  . None  Tobacco Use  . Smoking status: Never Smoker  . Smokeless tobacco: Never Used  Substance and Sexual Activity  . Alcohol use: Yes    Comment: socially  . Drug use: No  . Sexual activity: Yes  Other Topics Concern  . None  Social History Narrative  . None      Allergies:  No Known Allergies  Metabolic Disorder Labs: No results found for: HGBA1C, MPG No results found for: PROLACTIN No results found for: CHOL, TRIG, HDL, CHOLHDL, VLDL, LDLCALC   Current Medications: Current Outpatient Medications  Medication Sig Dispense Refill  . atenolol (TENORMIN) 50 MG tablet Take 50 mg by mouth daily.    Marland Kitchen buPROPion (WELLBUTRIN SR) 150 MG 12 hr tablet Take 1 tablet (150 mg total) by mouth daily. 30 tablet 1  . calcium-vitamin D (OSCAL WITH D) 500-200 MG-UNIT tablet Take 1 tablet by mouth.    . escitalopram (LEXAPRO) 20 MG tablet Take 1 tablet (20 mg total) by mouth daily. 30 tablet 0  . hydrochlorothiazide (HYDRODIURIL) 12.5 MG tablet Take 12.5 mg by mouth daily.    Marland Kitchen  ibuprofen (ADVIL,MOTRIN) 200 MG tablet Take 200-400 mg by mouth 2 (two) times daily as needed for headache or moderate pain.     No current facility-administered medications for this visit.       Psychiatric Specialty Exam: Review of Systems  Cardiovascular: Negative for chest pain and palpitations.  Skin: Negative for rash.  Psychiatric/Behavioral: Positive for depression. Negative for substance abuse.    Blood pressure 122/88, pulse 74, height 5\' 7"  (1.702 m), weight 232 lb (105.2 kg), last menstrual period  08/08/2017.Body mass index is 36.34 kg/m.  General Appearance: Casual  Eye Contact:  Fair  Speech:  Normal Rate  Volume:  Decreased  Mood:  Feels subuded  Affect:  congruent  Thought Process:  Goal Directed  Orientation:  Full (Time, Place, and Person)  Thought Content:  Rumination  Suicidal Thoughts:  No  Homicidal Thoughts:  No  Memory:  Immediate;   Fair Recent;   Fair  Judgement:  Fair  Insight:  Shallow  Psychomotor Activity:  Decreased  Concentration:  Concentration: Fair and Attention Span: Fair  Recall:  [employer] of Knowledge:Fair  Language: Fair  Akathisia:  Negative  Handed:  Right  AIMS (if indicated):    Assets:  Social Support  ADL's:  Intact  Cognition: WNL  Sleep:  fair    Treatment Plan Summary: Medication management and Plan as follows  1. Major depression recurrent :feeling down. Increase wellbutrin to 150mg sr 2. SOcial anxiety: fluctuates. Continue lexapro 3. GAD: at times more. Continue lexapro  Drink more water. Avoid soda drinks to work on less tiredness. Increase activities FU 1-2 months. Renewed meds  Merian Capron, MD 1/22/20191:46 PM

## 2017-08-28 ENCOUNTER — Ambulatory Visit
Admission: RE | Admit: 2017-08-28 | Discharge: 2017-08-28 | Disposition: A | Discharge status: Home / Self Care | Payer: No Typology Code available for payment source | Source: Ambulatory Visit | Attending: Family Medicine | Admitting: Family Medicine

## 2017-08-28 DIAGNOSIS — Z139 Encounter for screening, unspecified: Secondary | ICD-10-CM

## 2017-09-14 MED FILL — PREVIDENT 5000 ENAMEL PROTE: 1.1-5 | 30 days supply | Qty: 100 | Fill #0

## 2017-10-05 MED FILL — ATENOLOL 50 MG TABLET: 50 | 30 days supply | Qty: 30 | Fill #0

## 2017-10-09 ENCOUNTER — Encounter (HOSPITAL_COMMUNITY): Payer: Self-pay | Admitting: Psychiatry

## 2017-10-09 ENCOUNTER — Ambulatory Visit (INDEPENDENT_AMBULATORY_CARE_PROVIDER_SITE_OTHER): Payer: No Typology Code available for payment source | Admitting: Psychiatry

## 2017-10-09 ENCOUNTER — Ambulatory Visit (HOSPITAL_COMMUNITY): Payer: Self-pay | Admitting: Psychiatry

## 2017-10-09 VITALS — BP 126/84 | HR 74 | Ht 67.5 in | Wt 228.0 lb

## 2017-10-09 DIAGNOSIS — Z818 Family history of other mental and behavioral disorders: Secondary | ICD-10-CM | POA: Diagnosis not present

## 2017-10-09 DIAGNOSIS — F332 Major depressive disorder, recurrent severe without psychotic features: Secondary | ICD-10-CM

## 2017-10-09 DIAGNOSIS — D320 Benign neoplasm of cerebral meninges: Secondary | ICD-10-CM

## 2017-10-09 DIAGNOSIS — F4010 Social phobia, unspecified: Secondary | ICD-10-CM | POA: Diagnosis not present

## 2017-10-09 MED ORDER — ESCITALOPRAM OXALATE 20 MG PO TABS: 30.0000 mg | ORAL_TABLET | Freq: Every day | ORAL | 2 refills | Status: DC

## 2017-10-09 MED ORDER — BUPROPION HCL ER (SR) 150 MG PO TB12: 150.0000 mg | ORAL_TABLET | Freq: Every day | ORAL | 2 refills | Status: DC

## 2017-10-09 MED FILL — BUPROPION SR 150 MG TABLET: 150 | 30 days supply | Qty: 30 | Fill #0

## 2017-10-09 MED FILL — ESCITALOPRAM 20 MG TABLET: 20 | 30 days supply | Qty: 45 | Fill #0

## 2017-10-09 NOTE — Progress Notes (Signed)
[facility] Outpatient Follow up visit  Patient Identification: Alexa [name] MRN:  [id number] Date of Evaluation:  10/09/2017 Referral Source: Counsellor Chief Complaint:    Visit Diagnosis:    ICD-10-CM   1. Severe episode of recurrent major depressive disorder, without psychotic features ([place]) F33.2   2. Meningioma of cerebellum ([place]) D32.0   3. Social anxiety disorder F40.10     History of Present Illness:  47 years old currently single African-American female initially referred by our counselor for management of depression and anxiety  Patient states to suffer from chronic depression since early age and also anxiety including social anxiety   Last visit wellbutrin was added . Helped some.  She feels to be distant from men since childhood. Says not sure of trauma  Talked about possibiity counselling   Lives with parents.  Motivation some better.      Aggravating factors. Brain tumor. Social anxiety Modifying factors :familhy Severity of depression: 6/10 Context: amotivation Duration: most of her adult life    Past Psychiatric History: SSRI treatment for depression and anxiety  Previous Psychotropic Medications: Yes   Substance Abuse History in the last 12 months:  No.  Consequences of Substance Abuse: NA  Past Medical History:  Past Medical History:  Diagnosis Date  . Anemia   . Anxiety   . GERD (gastroesophageal reflux disease)   . Headache   . Hypertension     Past Surgical History:  Procedure Laterality Date  . CRANIOTOMY  05/26/2016   Procedure: SUBOCCIPITAL CRANIOTOMY WITH PLACEMENT OF VENTRICULAR CATHETER;  Surgeon: Ashok Pall, MD;  Location: [facility];  Service: Neurosurgery;;  [place]  . WISDOM TOOTH EXTRACTION      Family Psychiatric History: father side : depression most family members  Family History:  Family History  Problem Relation Age of Onset  . Hypertension Mother   . Heart disease Mother   . Hypertension Father    . Kidney disease Father     Social History:   Social History   Socioeconomic History  . Marital status: Single    Spouse name: None  . Number of children: None  . Years of education: None  . Highest education level: None  Social Needs  . Financial resource strain: None  . Food insecurity - worry: None  . Food insecurity - inability: None  . Transportation needs - medical: None  . Transportation needs - non-medical: None  Occupational History  . None  Tobacco Use  . Smoking status: Never Smoker  . Smokeless tobacco: Never Used  Substance and Sexual Activity  . Alcohol use: Yes    Comment: socially  . Drug use: No  . Sexual activity: Yes  Other Topics Concern  . None  Social History Narrative  . None      Allergies:  No Known Allergies  Metabolic Disorder Labs: No results found for: HGBA1C, MPG No results found for: PROLACTIN No results found for: CHOL, TRIG, HDL, CHOLHDL, VLDL, LDLCALC   Current Medications: Current Outpatient Medications  Medication Sig Dispense Refill  . atenolol (TENORMIN) 50 MG tablet Take 50 mg by mouth daily.    Marland Kitchen buPROPion (WELLBUTRIN SR) 150 MG 12 hr tablet Take 1 tablet (150 mg total) by mouth daily. 30 tablet 2  . escitalopram (LEXAPRO) 20 MG tablet Take 1.5 tablets (30 mg total) by mouth daily. 45 tablet 2  . hydrochlorothiazide (HYDRODIURIL) 12.5 MG tablet Take 12.5 mg by mouth daily.    Marland Kitchen ibuprofen (ADVIL,MOTRIN) 200 MG  tablet Take 200-400 mg by mouth 2 (two) times daily as needed for headache or moderate pain.    . calcium-vitamin D (OSCAL WITH D) 500-200 MG-UNIT tablet Take 1 tablet by mouth.     No current facility-administered medications for this visit.       Psychiatric Specialty Exam: Review of Systems  Cardiovascular: Negative for chest pain and palpitations.  Skin: Negative for rash.  Psychiatric/Behavioral: Negative for substance abuse.    Blood pressure 126/84, pulse 74, height 5' 7.5" (1.715 m), weight 228 lb  (103.4 kg).Body mass index is 35.18 kg/m.  General Appearance: Casual  Eye Contact:  Fair  Speech:  Normal Rate  Volume:  Decreased  Mood:  subdued  Affect:  congruent  Thought Process:  Goal Directed  Orientation:  Full (Time, Place, and Person)  Thought Content:  Rumination  Suicidal Thoughts:  No  Homicidal Thoughts:  No  Memory:  Immediate;   Fair Recent;   Fair  Judgement:  Fair  Insight:  Shallow  Psychomotor Activity:  Decreased  Concentration:  Concentration: Fair and Attention Span: Fair  Recall:  [employer] of Knowledge:Fair  Language: Fair  Akathisia:  Negative  Handed:  Right  AIMS (if indicated):    Assets:  Social Support  ADL's:  Intact  Cognition: WNL  Sleep:  fair    Treatment Plan Summary: Medication management and Plan as follows  1. Major depression recurrent :subdued. Increase lexapro to 30mg  as it may have effect to underlying ptsd symptoms if any. Consider therapy to deal with concerns in developing relationship 2. SOcial anxiety: fluctuates. Increase lexapro to 30mg  3. GAD: at times more. Continue lexapro  Questions and side effects reviewed and addresssed fui2 -61m  Merian Capron, MD 3/11/20194:45 PM

## 2017-12-04 MED FILL — ESCITALOPRAM 20 MG TABLET: 20 | 30 days supply | Qty: 45 | Fill #1

## 2017-12-04 MED FILL — ATENOLOL 50 MG TABLET: 50 | 30 days supply | Qty: 30 | Fill #1

## 2017-12-04 MED FILL — BUPROPION SR 150 MG TABLET: 150 | 30 days supply | Qty: 30 | Fill #0

## 2017-12-22 IMAGING — MR MR HEAD WO/W CM
9 of 15 series · 31 of 48 positions shown · IV contrast (multihance)
Comparison: No comparison exam available nor is an outside report
available.

CLINICAL DATA: 44-year-old female with abnormal outside CT
revealing calcified 5-6 cm mass. Headaches. Initial encounter.

EXAM:
MRI HEAD WITHOUT AND WITH CONTRAST
TECHNIQUE: Multiplanar, multiecho pulse sequences of the brain and surrounding
structures were obtained without and with intravenous contrast.
CONTRAST:  19mL MULTIHANCE GADOBENATE DIMEGLUMINE 529 MG/ML IV SOLN

[Series 4: DWI · axial · 3.0mm · 1.09mm/px · z∈[-117,+36]mm · 8 of 106 slices shown (1 of 4)]
[im 1/106]
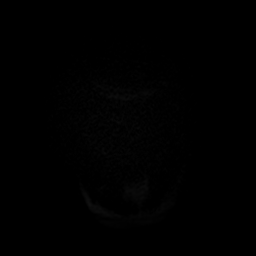
[im 16/106]
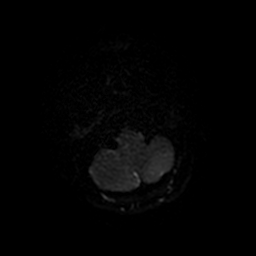
[im 31/106]
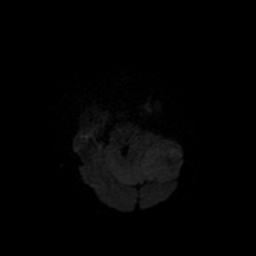
[im 46/106]
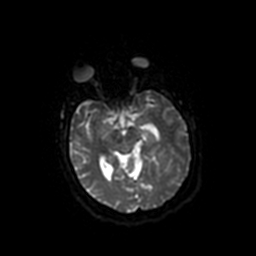
[im 61/106]
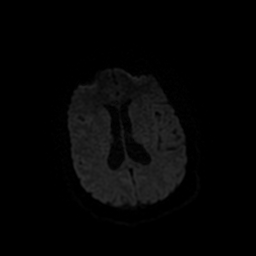
[im 76/106]
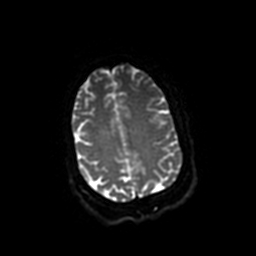
[im 91/106]
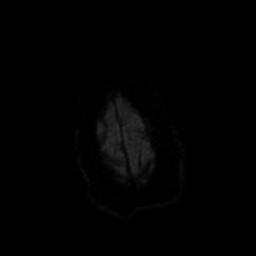
[im 106/106]
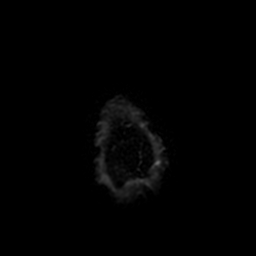

[Series 5: DWI · coronal · 5.0mm · 1.09mm/px · 6 of 78 slices shown (2 of 4)]
[im 1/78]
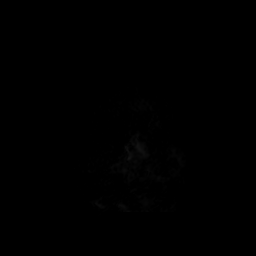
[im 16/78]
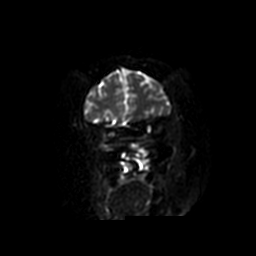
[im 31/78]
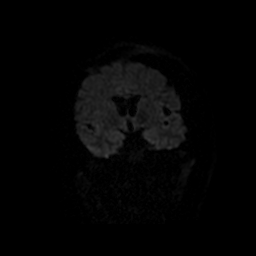
[im 47/78]
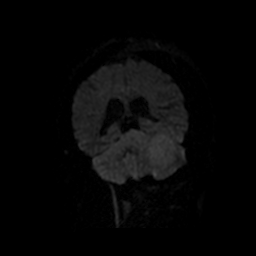
[im 62/78]
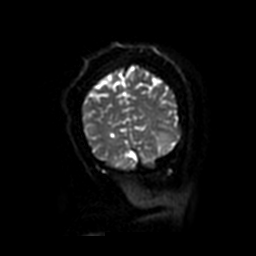
[im 78/78]
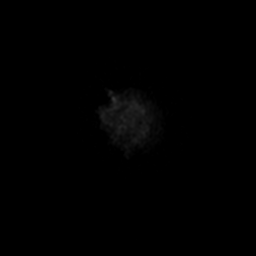

[Series 6: T2 · axial · 5.0mm · 0.43mm/px · z∈[-116,+44]mm · 2 of 26 slices shown]
[im 1/26]
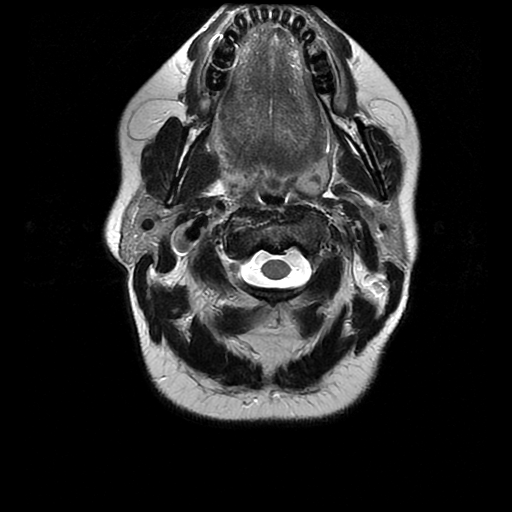
[im 26/26]
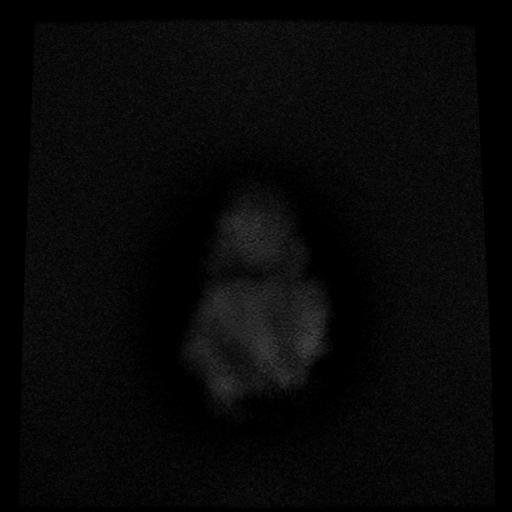

[Series 7: FLAIR · axial · 5.0mm · 0.43mm/px · z∈[-122,+51]mm · 2 of 26 slices shown]
[im 1/26]
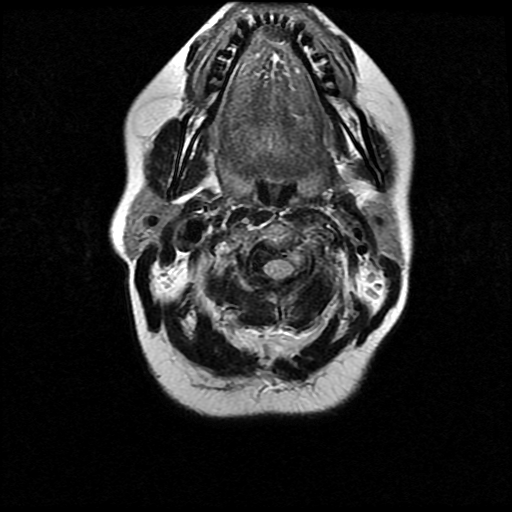
[im 26/26]
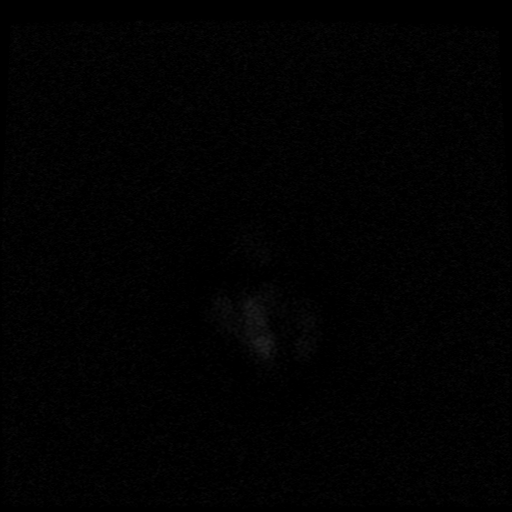

[Series 10: T2 post-contrast · coronal · 5.0mm · 0.45mm/px · 2 of 31 slices shown]
[im 1/31]
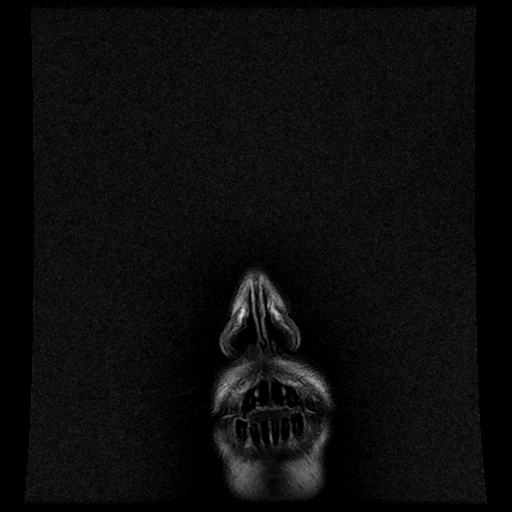
[im 31/31]
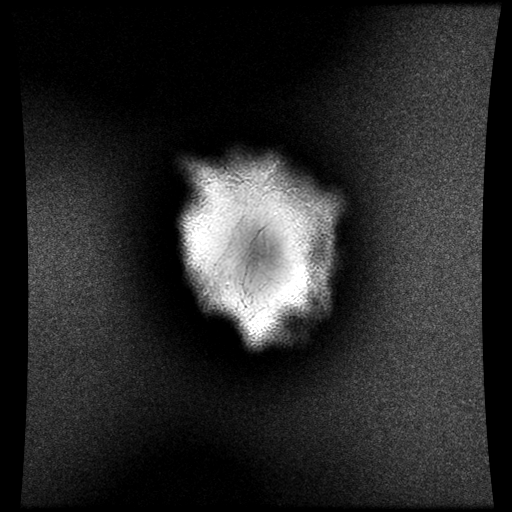

[Series 12: T1 post-contrast · coronal · 5.0mm · 0.45mm/px · 2 of 31 slices shown (1 of 2)]
[im 1/31]
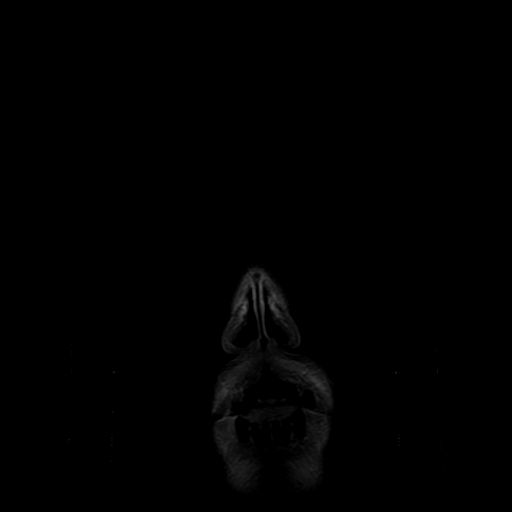
[im 31/31]
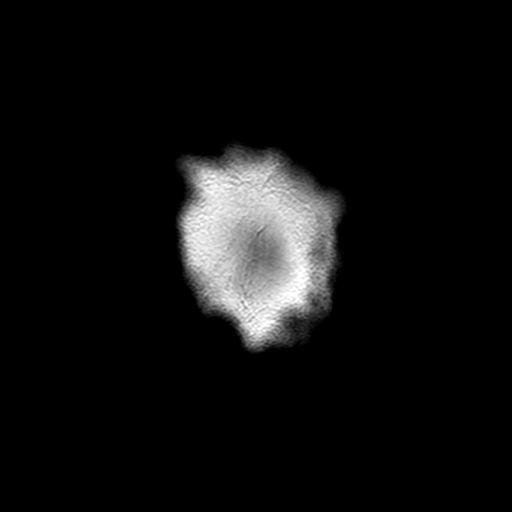

[Series 13: T1 post-contrast · sagittal · 5.0mm · 0.47mm/px · 2 of 27 slices shown (2 of 2)]
[im 1/27]
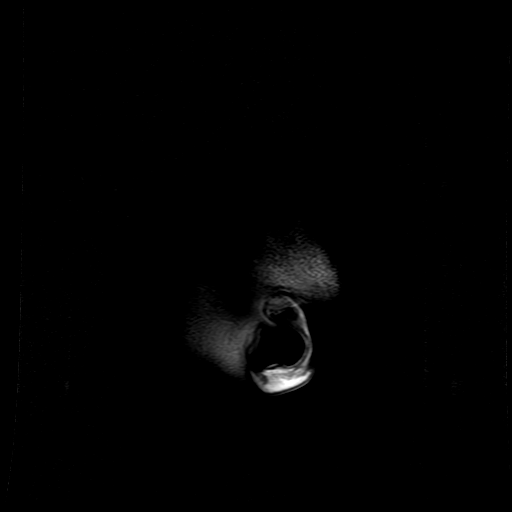
[im 27/27]
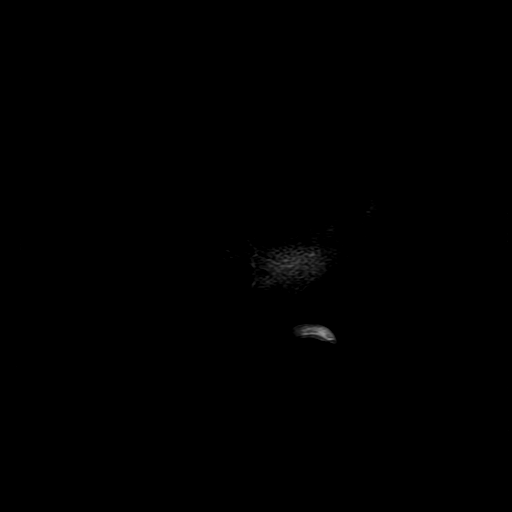

[Series 400: DWI · axial · 3.0mm · 1.09mm/px · z∈[-117,+36]mm · 4 of 53 slices shown (3 of 4)]
[im 1/53]
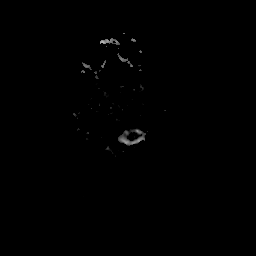
[im 18/53]
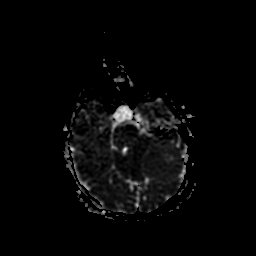
[im 35/53]
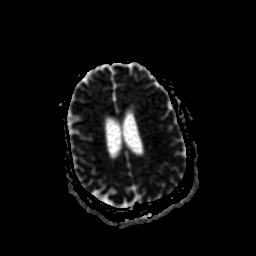
[im 53/53]
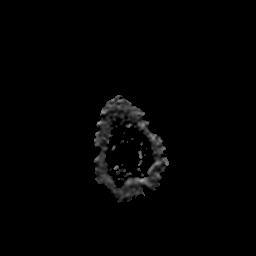

[Series 500: DWI · coronal · 5.0mm · 1.09mm/px · 3 of 39 slices shown (4 of 4)]
[im 1/39]
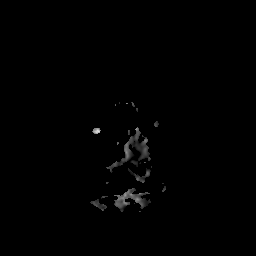
[im 20/39]
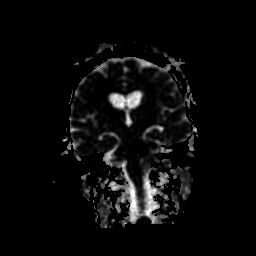
[im 39/39]
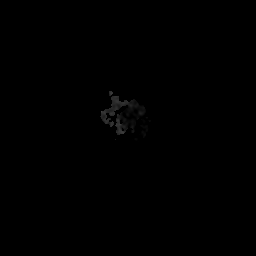

[31 of 48 positions shown; findings below may reference images not displayed]

FINDINGS: Brain: Left posterior fossa extra-axial appearing 5 x 4.6 x 5.4 cm
mass having characteristics suggestive of a meningioma. This
compresses the left cerebellum with mild surrounding vasogenic
edema. Compression, distortion and narrowing of the fourth
ventricle. Inferior displacement of cerebellar tonsils filling the
foramen magnum.

This mass elevates the left tentorium with superior displacement of
the posterior left temporal lobe and occipital lobe. Left transverse
sinus may be narrowed. Difficult to exclude extension of mass into
the left transverse sinus. Dominant venous drainage to the right
with remainder of major intracranial venous structures patent.

Although the ventricles do not appear significantly enlarged. Linear
periventricular hyperintensity raises possibility of mild
subependymal reabsorption of fluid. Patient is at risk for
development of hydrocephalus.

Expanded partially empty sella. 6.1 x 6.7 x 4.7 mm cystic appearing
structure with in the right aspect of the the flatten pituitary
gland immediately anterior to infundibular insertion. This may
represent a cystic pituitary adenoma, Rathke's cleft cyst or pars
intermedius cyst.

No acute infarct or intracranial hemorrhage.

Vascular: Major intracranial arterial structures are patent with
small vertebral arteries and basilar artery and fetal contribution
to the right posterior cerebral artery.

Venous drainage as noted above.

Skull and upper cervical spine: Cervical spondylotic changes with
spinal stenosis C2-3 through C4-5 with minimal cord flattening C4-5
level.

Sinuses/Orbits: No acute orbital abnormality. Minimal frontal sinus,
ethmoid sinus air cell and maxillary sinus mucosal thickening with 6
mm polypoid opacification medial aspect right maxillary sinus.

Other: Decreased signal intensity of bone marrow may be related to
patient's habitus. Correlation with CBC to exclude anemia
contributing to this appearance may be considered.
IMPRESSION: Left posterior fossa 5 x 4.6 x 5.4 cm mass suggestive of a
meningioma. This compresses the left cerebellum with mild
surrounding vasogenic edema. Compression, distortion and narrowing
of the fourth ventricle. Inferior displacement of cerebellar tonsils
filling the foramen magnum.

This mass elevates the left tentorium with superior displacement of
the posterior left temporal lobe and occipital lobe. Left transverse
sinus may be narrowed. Difficult to exclude extension of mass into
the left transverse sinus.

Linear periventricular hyperintensity raises possibility of mild
subependymal reabsorption of fluid. Patient is at risk for
development of hydrocephalus.

Expanded partially empty sella. 6.1 x 6.7 x 4.7 mm cystic appearing
structure within the right aspect of the the flatten pituitary gland
immediately anterior to infundibular insertion. This may represent a
cystic pituitary adenoma, Rathke's cleft cyst or pars intermedius
cyst.

Cervical spondylotic changes with spinal stenosis C2-3 through C4-5
with minimal cord flattening C4-5 level.

Minimal frontal sinus, ethmoid sinus air cell and maxillary sinus
mucosal thickening with 6 mm polypoid opacification medial aspect
right maxillary sinus.

Decreased signal intensity of bone marrow may be related to
patient's habitus. Correlation with CBC to exclude anemia
contributing to this appearance may be considered.

These results were called by telephone at the time of interpretation
on 05/21/2016 at [DATE] to Dr. FRANICKS LUCID , who verbally
acknowledged these results.

## 2018-01-01 ENCOUNTER — Ambulatory Visit (HOSPITAL_COMMUNITY): Payer: Self-pay | Admitting: Psychiatry

## 2018-01-11 MED FILL — ATENOLOL 50 MG TABLET: 50 | 30 days supply | Qty: 30 | Fill #0

## 2018-01-11 MED FILL — HYDROCHLOROTHIAZIDE 12.5 MG: 12.5 | 30 days supply | Qty: 30 | Fill #0

## 2018-02-22 MED FILL — BUPROPION SR 150 MG TABLET: 150 | 30 days supply | Qty: 30 | Fill #1

## 2018-03-08 MED FILL — HYDROCHLOROTHIAZIDE 12.5 MG: 12.5 | 30 days supply | Qty: 30 | Fill #1

## 2018-03-28 MED FILL — ESCITALOPRAM 20 MG TABLET: 20 | 30 days supply | Qty: 45 | Fill #2

## 2018-04-13 MED FILL — ATENOLOL 50 MG TABLET: 50 | 90 days supply | Qty: 90 | Fill #1

## 2018-04-23 MED FILL — BUPROPION SR 150 MG TABLET: 150 | 30 days supply | Qty: 30 | Fill #0

## 2018-04-27 MED FILL — VIT D3-50 50,000 UNITS CAPS: 1.25 MG | 84 days supply | Qty: 12 | Fill #0

## 2018-04-30 MED FILL — HYDROCHLOROTHIAZIDE 12.5 MG: 12.5 | 30 days supply | Qty: 30 | Fill #2

## 2018-06-01 ENCOUNTER — Ambulatory Visit (INDEPENDENT_AMBULATORY_CARE_PROVIDER_SITE_OTHER): Payer: No Typology Code available for payment source | Admitting: Psychiatry

## 2018-06-01 ENCOUNTER — Encounter (HOSPITAL_COMMUNITY): Payer: Self-pay | Admitting: Psychiatry

## 2018-06-01 VITALS — BP 126/82 | HR 90 | Ht 68.0 in | Wt 228.0 lb

## 2018-06-01 DIAGNOSIS — D320 Benign neoplasm of cerebral meninges: Secondary | ICD-10-CM | POA: Diagnosis not present

## 2018-06-01 DIAGNOSIS — F4010 Social phobia, unspecified: Secondary | ICD-10-CM | POA: Diagnosis not present

## 2018-06-01 DIAGNOSIS — F332 Major depressive disorder, recurrent severe without psychotic features: Secondary | ICD-10-CM | POA: Diagnosis not present

## 2018-06-01 MED ORDER — BUPROPION HCL ER (SR) 150 MG PO TB12: 150.0000 mg | ORAL_TABLET | Freq: Every day | ORAL | 2 refills | Status: DC

## 2018-06-01 MED ORDER — ESCITALOPRAM OXALATE 20 MG PO TABS: 40.0000 mg | ORAL_TABLET | Freq: Every day | ORAL | 2 refills | Status: DC

## 2018-06-01 MED FILL — ESCITALOPRAM 20 MG TABLET: 20 | 30 days supply | Qty: 60 | Fill #0

## 2018-06-01 MED FILL — BUPROPION SR 150 MG TABLET: 150 | 30 days supply | Qty: 30 | Fill #0

## 2018-06-01 NOTE — Progress Notes (Signed)
The [facility] Outpatient Follow up visit  Patient Identification: Alexa [name] MRN:  [id number] Date of Evaluation:  06/01/2018 Referral Source: Counsellor Chief Complaint:   Chief Complaint    Follow-up; Medication Refill     Visit Diagnosis:    ICD-10-CM   1. Severe episode of recurrent major depressive disorder, without psychotic features ([place]) F33.2   2. 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 Social anxiety disorder F40.10     History of Present Illness:  47 years old currently single African-American female initially referred by our counselor for management of depression and anxiety. Works with [employer]  Last visit lexapro was increased, it helped some Patient states to suffer from chronic depression since early age and also anxiety including social anxiety   wellbutrin helps depression Feels anxious at work.    Lives with parents.  Motivation some better.      Aggravating factors. Brain tumor. Social anxiety Modifying factors :familhy Severity of depression: 6/10  Context: amotivation Duration: most of her adult life    Past Psychiatric History: SSRI treatment for depression and anxiety  Previous Psychotropic Medications: Yes   Substance Abuse History in the last 12 months:  No.  Consequences of Substance Abuse: NA  Past Medical History:  Past Medical History:  Diagnosis Date  . Anemia   . Anxiety   . GERD (gastroesophageal reflux disease)   . Headache   . Hypertension     Past Surgical History:  Procedure Laterality Date  . CRANIOTOMY  05/26/2016   Procedure: SUBOCCIPITAL CRANIOTOMY WITH PLACEMENT OF VENTRICULAR CATHETER;  Surgeon: Ashok Pall, MD;  Location: [place];  Service: Neurosurgery;;  [place]  . WISDOM TOOTH EXTRACTION      Family Psychiatric History: father side : depression most family members  Family History:  Family History  Problem Relation Age of Onset  . Hypertension Mother   .  Heart disease Mother   . Hypertension Father   . Kidney disease Father     Social History:   Social History   Socioeconomic History  . Marital status: Single    Spouse name: Not on file  . Number of children: Not on file  . Years of education: Not on file  . Highest education level: Not on file  Occupational History  . Not on file  Social Needs  . Financial resource strain: Not on file  . Food insecurity:    Worry: Not on file    Inability: Not on file  . Transportation needs:    Medical: Not on file    Non-medical: Not on file  Tobacco Use  . Smoking status: Never Smoker  . Smokeless tobacco: Never Used  Substance and Sexual Activity  . Alcohol use: Yes    Comment: socially  . Drug use: No  . Sexual activity: Yes  Lifestyle  . Physical activity:    Days per week: Not on file    Minutes per session: Not on file  . Stress: Not on file  Relationships  . Social connections:    Talks on phone: Not on file    Gets together: Not on file    Attends religious service: Not on file    Active member of club or organization: Not on file    Attends meetings of clubs or organizations: Not on file    Relationship status: Not on file  Other Topics Concern  . Not on file  Social History Narrative  . Not on  file      Allergies:  No Known Allergies  Metabolic Disorder Labs: No results found for: HGBA1C, MPG No results found for: PROLACTIN No results found for: CHOL, TRIG, HDL, CHOLHDL, VLDL, LDLCALC   Current Medications: Current Outpatient Medications  Medication Sig Dispense Refill  . atenolol (TENORMIN) 50 MG tablet Take 50 mg by mouth daily.    Marland Kitchen buPROPion (WELLBUTRIN SR) 150 MG 12 hr tablet Take 1 tablet (150 mg total) by mouth daily. 30 tablet 2  . Cholecalciferol (VITAMIN D3) 50000 units TABS Take by mouth. Once per week    . escitalopram (LEXAPRO) 20 MG tablet Take 2 tablets (40 mg total) by mouth daily. 60 tablet 2  . hydrochlorothiazide (HYDRODIURIL) 12.5 MG  tablet Take 12.5 mg by mouth daily.    Marland Kitchen ibuprofen (ADVIL,MOTRIN) 200 MG tablet Take 200-400 mg by mouth 2 (two) times daily as needed for headache or moderate pain.    . calcium-vitamin D (OSCAL WITH D) 500-200 MG-UNIT tablet Take 1 tablet by mouth.     No current facility-administered medications for this visit.       Psychiatric Specialty Exam: Review of Systems  Cardiovascular: Negative for chest pain and palpitations.  Skin: Negative for rash.  Neurological: Negative for tremors.  Psychiatric/Behavioral: Negative for substance abuse. The patient is nervous/anxious.     Blood pressure 126/82, pulse 90, height 5\' 8"  (1.727 m), weight 228 lb (103.4 kg), SpO2 98 %.Body mass index is 34.67 kg/m.  General Appearance: Casual  Eye Contact:  Fair  Speech:  Normal Rate  Volume:  Decreased  Mood:  Somewhat subdued  Affect:  congruent  Thought Process:  Goal Directed  Orientation:  Full (Time, Place, and Person)  Thought Content:  Rumination  Suicidal Thoughts:  No  Homicidal Thoughts:  No  Memory:  Immediate;   Fair Recent;   Fair  Judgement:  Fair  Insight:  Shallow  Psychomotor Activity:  Decreased  Concentration:  Concentration: Fair and Attention Span: Fair  Recall:  [employer] of Knowledge:Fair  Language: Fair  Akathisia:  Negative  Handed:  Right  AIMS (if indicated):    Assets:  Social Support  ADL's:  Intact  Cognition: WNL  Sleep:  fair    Treatment Plan Summary: Medication management and Plan as follows  1. Major depression recurrent :some better. Increase lexapro. Continue wellbutrin 2. SOcial anxiety: ongoing. Increase lexapro 40mg . 3. GAD: at times more. Increase lexapro 40mg  Call if need to add buspar  Questions and side effects reviewed and addresssed fui2 -63m  Merian Capron, MD 11/1/20191:15 PM

## 2018-06-27 MED FILL — HYDROCHLOROTHIAZIDE 12.5 MG: 12.5 | 30 days supply | Qty: 30 | Fill #3

## 2018-08-10 MED FILL — ESCITALOPRAM 20 MG TABLET: 20 | 30 days supply | Qty: 60 | Fill #1

## 2018-08-10 MED FILL — BUPROPION SR 150 MG TABLET: 150 | 30 days supply | Qty: 30 | Fill #1

## 2018-08-27 MED FILL — HYDROCHLOROTHIAZIDE 12.5 MG: 12.5 | 30 days supply | Qty: 30 | Fill #4

## 2018-09-14 ENCOUNTER — Encounter: Payer: Self-pay | Admitting: Emergency Medicine

## 2018-09-14 ENCOUNTER — Emergency Department
Admission: EM | Admit: 2018-09-14 | Discharge: 2018-09-14 | Disposition: A | Discharge status: Home / Self Care | Payer: No Typology Code available for payment source | Source: Home / Self Care | Attending: Emergency Medicine | Admitting: Emergency Medicine

## 2018-09-14 ENCOUNTER — Other Ambulatory Visit: Payer: Self-pay

## 2018-09-14 DIAGNOSIS — J029 Acute pharyngitis, unspecified: Secondary | ICD-10-CM | POA: Diagnosis not present

## 2018-09-14 LAB — POCT RAPID STREP A (OFFICE): RAPID STREP A SCREEN: NEGATIVE

## 2018-09-14 MED ORDER — FIRST-DUKES MOUTHWASH MT SUSP: OROMUCOSAL | 1 refills | Status: DC

## 2018-09-14 MED FILL — MAGIC MOUTHWASH BOP FORM: 8 days supply | Qty: 120 | Fill #0

## 2018-09-14 NOTE — ED Provider Notes (Signed)
[name]    CSN: [id number] Arrival date & time: 09/14/18  0803     History   Chief Complaint Chief Complaint  Patient presents with  . Sore Throat    HPI Alexa [name] is a 48 y.o. female.   HPI Patient states she has been bothered with a mild sore throat for the last few days but became much worse yesterday.  She has no fever no other symptoms.  She does not have any head congestion ear pain or cough.  She has had no myalgias.  She has no other flulike symptoms.  Of note she has been at the hospital the last week with her father's illness.  He was diagnosed with RSV. Past Medical History:  Diagnosis Date  . Anemia   . Anxiety   . GERD (gastroesophageal reflux disease)   . Headache   . Hypertension     Patient Active Problem List   Diagnosis Date Noted  . Social anxiety disorder 12/25/2016  . Severe episode of recurrent major depressive disorder, without psychotic features ([place]) 12/25/2016  . Meningioma of cerebellum ([place]) 05/26/2016    Past Surgical History:  Procedure Laterality Date  . CRANIOTOMY  05/26/2016   Procedure: SUBOCCIPITAL CRANIOTOMY WITH PLACEMENT OF VENTRICULAR CATHETER;  Surgeon: Ashok Pall, MD;  Location: [place];  Service: Neurosurgery;;  [place]  . WISDOM TOOTH EXTRACTION      OB History   No obstetric history on file.      Home Medications    Prior to Admission medications   Medication Sig Start Date End Date Taking? Authorizing Provider  atenolol (TENORMIN) 50 MG tablet Take 50 mg by mouth daily.    [provider]  buPROPion (WELLBUTRIN SR) 150 MG 12 hr tablet Take 1 tablet (150 mg total) by mouth daily. 06/01/18   Merian Capron, MD  calcium-vitamin D (OSCAL WITH D) 500-200 MG-UNIT tablet Take 1 tablet by mouth.    [provider]  Cholecalciferol (VITAMIN D3) 50000 units TABS Take by mouth. Once per week    [provider]  Diphenhyd-Hydrocort-Nystatin (FIRST-DUKES  MOUTHWASH) SUSP 1 teaspoon as rinse gargle and spit 4 times a day 09/14/18   Darlyne Russian, MD  escitalopram (LEXAPRO) 20 MG tablet Take 2 tablets (40 mg total) by mouth daily. 06/01/18   Merian Capron, MD  hydrochlorothiazide (HYDRODIURIL) 12.5 MG tablet Take 12.5 mg by mouth daily.    [provider]  ibuprofen (ADVIL,MOTRIN) 200 MG tablet Take 200-400 mg by mouth 2 (two) times daily as needed for headache or moderate pain.    [provider]    Family History Family History  Problem Relation Age of Onset  . Hypertension Mother   . Heart disease Mother   . Hypertension Father   . Kidney disease Father     Social History Social History   Tobacco Use  . Smoking status: Never Smoker  . Smokeless tobacco: Never Used  Substance Use Topics  . Alcohol use: Yes    Comment: socially  . Drug use: No     Allergies   Patient has no known allergies.   Review of Systems Review of Systems  Constitutional: Negative.   HENT: Positive for sore throat. Negative for congestion and rhinorrhea.   Eyes: Negative.   Respiratory: Negative.   Cardiovascular: Negative.   Gastrointestinal: Negative.   Psychiatric/Behavioral:       Patient has a history of severe depression.  She recently lost her father  but is giving significant spiritual help which has helped her.     Physical Exam Triage Vital Signs ED Triage Vitals  Enc Vitals Group     BP 09/14/18 0825 133/85     Pulse Rate 09/14/18 0825 80     Resp --      Temp 09/14/18 0825 98.7 F (37.1 C)     Temp Source 09/14/18 0825 Oral     SpO2 09/14/18 0825 96 %     Weight 09/14/18 0826 219 lb (99.3 kg)     Height 09/14/18 0826 5\' 8"  (1.727 m)     Head Circumference --      Peak Flow --      Pain Score 09/14/18 0825 3     Pain Loc --      Pain Edu? --      Excl. in [place]? --    No data found.  Updated Vital Signs BP 133/85 (BP Location: Right Arm)   Pulse 80   Temp 98.7 F (37.1 C) (Oral)   Ht 5\' 8"  (1.727 m)    Wt 99.3 kg   SpO2 96%   BMI 33.30 kg/m   Visual Acuity Right Eye Distance:   Left Eye Distance:   Bilateral Distance:    Right Eye Near:   Left Eye Near:    Bilateral Near:     Physical Exam Constitutional:      Appearance: She is well-developed.  HENT:     Head: Normocephalic.     Right Ear: Tympanic membrane normal.     Left Ear: Tympanic membrane normal.     Nose: No congestion.     Mouth/Throat:     Mouth: No oral lesions.     Pharynx: Oropharyngeal exudate and posterior oropharyngeal erythema present. No uvula swelling.  Neck:     Musculoskeletal: Normal range of motion.     Comments: There is mild right anterior cervical tenderness but no definite adenopathy. Cardiovascular:     Rate and Rhythm: Normal rate and regular rhythm.  Pulmonary:     Effort: Pulmonary effort is normal.     Breath sounds: Normal breath sounds.  Lymphadenopathy:     Cervical: No cervical adenopathy.  Neurological:     Mental Status: She is alert.      UC Treatments / Results  Labs (all labs ordered are listed, but only abnormal results are displayed) Labs Reviewed  STREP A DNA PROBE  POCT RAPID STREP A (OFFICE)    EKG None  Radiology No results found.  Procedures Procedures (including critical care time)  Medications Ordered in UC Medications - No data to display  Initial Impression / Assessment and Plan / UC Course  I have reviewed the triage vital signs and the nursing notes.  Pertinent labs & imaging results that were available during my care of the patient were reviewed by me and considered in my medical decision making (see chart for details). Patient has a minimal amount of redness and posterior pharyngeal exudate.  There is minimal right anterior cervical tenderness but no definite node.  Strep screen is negative will treat symptomatically and send strep culture.     Final Clinical Impressions(s) / UC Diagnoses   Final diagnoses:  Acute pharyngitis,  unspecified etiology     Discharge Instructions     We will call you with culture results. Use gargle. You can take either Tylenol or ibuprofen for discomfort whichever one you typically use.    ED Prescriptions  Medication Sig Dispense Auth. Provider   Diphenhyd-Hydrocort-Nystatin (FIRST-DUKES MOUTHWASH) SUSP 1 teaspoon as rinse gargle and spit 4 times a day 120 mL Darlyne Russian, MD     Controlled Substance Prescriptions [place] Controlled Substance Registry consulted? Not Applicable   Darlyne Russian, MD 09/14/18 [phone]

## 2018-09-14 NOTE — Discharge Instructions (Signed)
We will call you with culture results. Use gargle. You can take either Tylenol or ibuprofen for discomfort whichever one you typically use.

## 2018-09-14 NOTE — ED Triage Notes (Signed)
Sore throat x 2 days

## 2018-09-15 LAB — STREP A DNA PROBE: Group A Strep Probe: NOT DETECTED

## 2018-09-16 ENCOUNTER — Telehealth: Payer: Self-pay

## 2018-09-16 NOTE — Telephone Encounter (Signed)
Pt notified of neg TCX results. Pt states she is feeling much improved since UC visit.

## 2018-09-26 MED FILL — SULFAMETHOXAZOLE-TMP DS TAB: 800-160 | 7 days supply | Qty: 14 | Fill #0

## 2018-10-04 MED FILL — ATENOLOL 50 MG TABLET: 50 | 60 days supply | Qty: 60 | Fill #2

## 2018-10-19 MED FILL — HYDROCHLOROTHIAZIDE 12.5 MG: 12.5 | 30 days supply | Qty: 30 | Fill #5

## 2018-10-19 MED FILL — BUPROPION HCL ER (SR) 150 M: 150 | 30 days supply | Qty: 30 | Fill #2

## 2018-10-19 MED FILL — ESCITALOPRAM 20 MG TABLET: 20 | 30 days supply | Qty: 60 | Fill #2

## 2018-10-24 MED FILL — MAGIC MOUTHWASH BOP FORM: 8 days supply | Qty: 120 | Fill #1

## 2019-01-14 ENCOUNTER — Emergency Department (HOSPITAL_BASED_OUTPATIENT_CLINIC_OR_DEPARTMENT_OTHER)
Admission: EM | Admit: 2019-01-14 | Discharge: 2019-01-14 | Disposition: A | Discharge status: Home / Self Care | Payer: No Typology Code available for payment source | Attending: Emergency Medicine | Admitting: Emergency Medicine

## 2019-01-14 ENCOUNTER — Other Ambulatory Visit: Payer: Self-pay

## 2019-01-14 ENCOUNTER — Other Ambulatory Visit (HOSPITAL_COMMUNITY): Payer: Self-pay | Admitting: Psychiatry

## 2019-01-14 ENCOUNTER — Encounter (HOSPITAL_BASED_OUTPATIENT_CLINIC_OR_DEPARTMENT_OTHER): Payer: Self-pay | Admitting: *Deleted

## 2019-01-14 DIAGNOSIS — Z79899 Other long term (current) drug therapy: Secondary | ICD-10-CM | POA: Insufficient documentation

## 2019-01-14 DIAGNOSIS — K047 Periapical abscess without sinus: Secondary | ICD-10-CM | POA: Insufficient documentation

## 2019-01-14 DIAGNOSIS — K0889 Other specified disorders of teeth and supporting structures: Secondary | ICD-10-CM | POA: Diagnosis present

## 2019-01-14 DIAGNOSIS — I10 Essential (primary) hypertension: Secondary | ICD-10-CM | POA: Diagnosis not present

## 2019-01-14 MED ORDER — AMOXICILLIN 500 MG PO CAPS: 500.0000 mg | ORAL_CAPSULE | Freq: Three times a day (TID) | ORAL | 0 refills | Status: DC

## 2019-01-14 MED FILL — HYDROCHLOROTHIAZIDE 12.5 MG: 12.5 | 90 days supply | Qty: 90 | Fill #0

## 2019-01-14 MED FILL — AMOXICILLIN 500 MG CAPSULE: 500 | 10 days supply | Qty: 30 | Fill #0

## 2019-01-14 MED FILL — ATENOLOL 50 MG TABLET: 50 | 90 days supply | Qty: 90 | Fill #0

## 2019-01-14 NOTE — Discharge Instructions (Addendum)
Complete your entire course of antibiotics as prescribed.  You  may use tylenol and ibuprofen (motrin or advil) and topical oragel or ambesol if needed for pain relief.  Avoid applying heat or ice to this abscess area which can worsen your symptoms.

## 2019-01-14 NOTE — ED Notes (Signed)
Pt. Was seen for L upper tooth pain and given abx. For this.  Pt. Walked to front and given discharge instructions.

## 2019-01-14 NOTE — ED Triage Notes (Signed)
Toothache. She thinks she has an abscess.

## 2019-01-14 NOTE — ED Provider Notes (Signed)
[facility] EMERGENCY DEPARTMENT Provider Note   CSN: [id number] Arrival date & time: 01/14/19  1705     History   Chief Complaint Chief Complaint  Patient presents with  . Dental Pain    HPI Alexa [name] is a 48 y.o. female presenting with a several week history of soreness which has been intermittent, along her upper left gingiva which has developed into a raised tender swelling several days ago. She has a partial along her left upper dentition involving her 2nd premolar and both molar teeth.  She has contacted her dentist and will see him next week but was instructed to go to an urgent care center for antibiotics.  There has been no fevers, chills, nausea or vomiting, also no complaint of difficulty swallowing, although chewing makes pain worse.  The patient has tried no medications prior to arrival.         The history is provided by the patient.    Past Medical History:  Diagnosis Date  . Anemia   . Anxiety   . GERD (gastroesophageal reflux disease)   . Headache   . Hypertension     Patient Active Problem List   Diagnosis Date Noted  . Social anxiety disorder 12/25/2016  . Severe episode of recurrent major depressive disorder, without psychotic features ([place]) 12/25/2016  . Meningioma of cerebellum ([place]) 05/26/2016    Past Surgical History:  Procedure Laterality Date  . CRANIOTOMY  05/26/2016   Procedure: SUBOCCIPITAL CRANIOTOMY WITH PLACEMENT OF VENTRICULAR CATHETER;  Surgeon: Ashok Pall, MD;  Location: [place];  Service: Neurosurgery;;  [place]  . WISDOM TOOTH EXTRACTION       OB History   No obstetric history on file.      Home Medications    Prior to Admission medications   Medication Sig Start Date End Date Taking? Authorizing Provider  amoxicillin (AMOXIL) 500 MG capsule Take 1 capsule (500 mg total) by mouth 3 (three) times daily. 01/14/19   Evalee Jefferson, PA-C  atenolol (TENORMIN) 50 MG tablet Take 50 mg by mouth  daily.    [provider]  buPROPion (WELLBUTRIN SR) 150 MG 12 hr tablet Take 1 tablet (150 mg total) by mouth daily. 06/01/18   Merian Capron, MD  calcium-vitamin D (OSCAL WITH D) 500-200 MG-UNIT tablet Take 1 tablet by mouth.    [provider]  Cholecalciferol (VITAMIN D3) 50000 units TABS Take by mouth. Once per week    [provider]  Diphenhyd-Hydrocort-Nystatin (FIRST-DUKES MOUTHWASH) SUSP 1 teaspoon as rinse gargle and spit 4 times a day 09/14/18   Darlyne Russian, MD  escitalopram (LEXAPRO) 20 MG tablet Take 2 tablets (40 mg total) by mouth daily. 06/01/18   Merian Capron, MD  hydrochlorothiazide (HYDRODIURIL) 12.5 MG tablet Take 12.5 mg by mouth daily.    [provider]  ibuprofen (ADVIL,MOTRIN) 200 MG tablet Take 200-400 mg by mouth 2 (two) times daily as needed for headache or moderate pain.    [provider]    Family History Family History  Problem Relation Age of Onset  . Hypertension Mother   . Heart disease Mother   . Hypertension Father   . Kidney disease Father     Social History Social History   Tobacco Use  . Smoking status: Never Smoker  . Smokeless tobacco: Never Used  Substance Use Topics  . Alcohol use: Yes    Comment: socially  . Drug use: No     Allergies  Patient has no known allergies.   Review of Systems Review of Systems  Constitutional: Negative for fever.  HENT: Positive for dental problem. Negative for facial swelling and sore throat.   Respiratory: Negative for shortness of breath.   Musculoskeletal: Negative for neck pain and neck stiffness.     Physical Exam Updated Vital Signs BP 138/87   Pulse (!) 58   Temp 98.1 F (36.7 C) (Oral)   Resp 20   Ht 5' 8.5" (1.74 m)   Wt 104.6 kg   LMP 01/09/2019   SpO2 100%   BMI 34.54 kg/m   Physical Exam Constitutional:      General: She is not in acute distress.    Appearance: She is well-developed.  HENT:     Head: Normocephalic and  atraumatic.     Jaw: No trismus.     Right Ear: Tympanic membrane and external ear normal.     Left Ear: Tympanic membrane and external ear normal.     Mouth/Throat:     Mouth: No oral lesions.     Dentition: Dental abscesses present.     Comments: Small area of induration above the left upper 1st and 2nd premolar location.  No fluctuance or pointing.  No drainage.  Sublingual space is soft.  No trismus. Eyes:     Conjunctiva/sclera: Conjunctivae normal.  Neck:     Musculoskeletal: Normal range of motion and neck supple.  Cardiovascular:     Rate and Rhythm: Normal rate.     Heart sounds: Normal heart sounds.  Pulmonary:     Effort: Pulmonary effort is normal.  Abdominal:     General: There is no distension.  Musculoskeletal: Normal range of motion.  Lymphadenopathy:     Cervical: No cervical adenopathy.  Skin:    General: Skin is warm and dry.     Findings: No erythema.  Neurological:     Mental Status: She is alert and oriented to person, place, and time.      ED Treatments / Results  Labs (all labs ordered are listed, but only abnormal results are displayed) Labs Reviewed - No data to display  EKG    Radiology No results found.  Procedures Procedures (including critical care time)  Medications Ordered in ED Medications - No data to display   Initial Impression / Assessment and Plan / ED Course  I have reviewed the triage vital signs and the nursing notes.  Pertinent labs & imaging results that were available during my care of the patient were reviewed by me and considered in my medical decision making (see chart for details).        Amoxil, discussed other home tx including tylenol, ibuprofen, ambesol. Return precautions outlined.  F/u with dentistry as planned.  Final Clinical Impressions(s) / ED Diagnoses   Final diagnoses:  Dental abscess    ED Discharge Orders         Ordered    amoxicillin (AMOXIL) 500 MG capsule  3 times daily     01/14/19  1734           Evalee Jefferson, Hershal Coria 01/14/19 1747    Charlesetta Shanks, MD 01/14/19 2001

## 2019-01-16 ENCOUNTER — Encounter (HOSPITAL_COMMUNITY): Payer: Self-pay | Admitting: Psychiatry

## 2019-01-16 ENCOUNTER — Ambulatory Visit (INDEPENDENT_AMBULATORY_CARE_PROVIDER_SITE_OTHER): Payer: No Typology Code available for payment source | Admitting: Psychiatry

## 2019-01-16 DIAGNOSIS — F4010 Social phobia, unspecified: Secondary | ICD-10-CM

## 2019-01-16 DIAGNOSIS — F332 Major depressive disorder, recurrent severe without psychotic features: Secondary | ICD-10-CM | POA: Diagnosis not present

## 2019-01-16 MED ORDER — BUPROPION HCL ER (SR) 150 MG PO TB12
150.0000 mg | ORAL_TABLET | Freq: Every day | ORAL | 2 refills | Status: DC
Start: 2019-01-16 — End: 2020-03-03

## 2019-01-16 MED ORDER — ESCITALOPRAM OXALATE 20 MG PO TABS: 40.0000 mg | ORAL_TABLET | Freq: Every day | ORAL | 2 refills | Status: DC

## 2019-01-16 MED FILL — ESCITALOPRAM 20 MG TABLET: 20 | 30 days supply | Qty: 60 | Fill #0

## 2019-01-16 MED FILL — BUPROPION HCL ER (SR) 150 M: 150 | 30 days supply | Qty: 30 | Fill #0

## 2019-01-16 NOTE — Progress Notes (Signed)
[facility] Outpatient Follow up visit  Patient Identification: Alexa [name] MRN:  [id number] Date of Evaluation:  01/16/2019 Referral Source: Counsellor Chief Complaint:   depression follow up  Visit Diagnosis:    ICD-10-CM   1. Severe episode of recurrent major depressive disorder, without psychotic features ([place])  F33.2   2. Social anxiety disorder  F40.10    I connected with Alexa [name] on 01/16/19 at  8:30 AM EDT by a video enabled telemedicine application and verified that I am speaking with the correct person using two identifiers.   I discussed the limitations of evaluation and management by telemedicine and the availability of in person appointments. The patient expressed understanding and agreed to proceed.  History of Present Illness:  48 years old currently single African-American female initially referred by our counselor for management of depression and anxiety. Works with [employer]  Doing fair on lexapro Lost Dad in Oct 03, 2022 sudden death due to bleeding ulcer. Family is going thru grief and recovering Social anxiety fair. Not taking buspar reguraly   wellbutrin helps depression  Motivation some better.      Aggravating factors. Brain tumor. Social anxiety Modifying factors :familhy Severity of depression: 6/10  Context: amotivation Duration: most of her adult life    Past Psychiatric History: SSRI treatment for depression and anxiety  Previous Psychotropic Medications: Yes   Substance Abuse History in the last 12 months:  No.  Consequences of Substance Abuse: NA  Past Medical History:  Past Medical History:  Diagnosis Date  . Anemia   . Anxiety   . GERD (gastroesophageal reflux disease)   . Headache   . Hypertension     Past Surgical History:  Procedure Laterality Date  . CRANIOTOMY  05/26/2016   Procedure: SUBOCCIPITAL CRANIOTOMY WITH PLACEMENT OF VENTRICULAR CATHETER;  Surgeon: Ashok Pall, MD;  Location: [place];  Service: Neurosurgery;;  [place]  . WISDOM TOOTH EXTRACTION      Family Psychiatric History: father side : depression most family members  Family History:  Family History  Problem Relation Age of Onset  . Hypertension Mother   . Heart disease Mother   . Hypertension Father   . Kidney disease Father     Social History:   Social History   Socioeconomic History  . Marital status: Single    Spouse name: Not on file  . Number of children: Not on file  . Years of education: Not on file  . Highest education level: Not on file  Occupational History  . Not on file  Social Needs  . Financial resource strain: Not on file  . Food insecurity    Worry: Not on file    Inability: Not on file  . Transportation needs    Medical: Not on file    Non-medical: Not on file  Tobacco Use  . Smoking status: Never Smoker  . Smokeless tobacco: Never Used  Substance and Sexual Activity  . Alcohol use: Yes    Comment: socially  . Drug use: No  . Sexual activity: Yes  Lifestyle  . Physical activity    Days per week: Not on file    Minutes per session: Not on file  . Stress: Not on file  Relationships  . Social Herbalist on phone: Not on file    Gets together: Not on file    Attends religious service: Not on file    Active member of club or organization: Not  on file    Attends meetings of clubs or organizations: Not on file    Relationship status: Not on file  Other Topics Concern  . Not on file  Social History Narrative  . Not on file      Allergies:  No Known Allergies  Metabolic Disorder Labs: No results found for: HGBA1C, MPG No results found for: PROLACTIN No results found for: CHOL, TRIG, HDL, CHOLHDL, VLDL, LDLCALC   Current Medications: Current Outpatient Medications  Medication Sig Dispense Refill  . amoxicillin (AMOXIL) 500 MG capsule Take 1 capsule (500 mg total) by mouth 3 (three) times daily. 30 capsule 0  . atenolol (TENORMIN)  50 MG tablet Take 50 mg by mouth daily.    Marland Kitchen buPROPion (WELLBUTRIN SR) 150 MG 12 hr tablet Take 1 tablet (150 mg total) by mouth daily. 30 tablet 2  . calcium-vitamin D (OSCAL WITH D) 500-200 MG-UNIT tablet Take 1 tablet by mouth.    . Cholecalciferol (VITAMIN D3) 50000 units TABS Take by mouth. Once per week    . Diphenhyd-Hydrocort-Nystatin (FIRST-DUKES MOUTHWASH) SUSP 1 teaspoon as rinse gargle and spit 4 times a day 120 mL 1  . escitalopram (LEXAPRO) 20 MG tablet Take 2 tablets (40 mg total) by mouth daily. 60 tablet 2  . hydrochlorothiazide (HYDRODIURIL) 12.5 MG tablet Take 12.5 mg by mouth daily.    Marland Kitchen ibuprofen (ADVIL,MOTRIN) 200 MG tablet Take 200-400 mg by mouth 2 (two) times daily as needed for headache or moderate pain.     No current facility-administered medications for this visit.       Psychiatric Specialty Exam: Review of Systems  Cardiovascular: Negative for chest pain and palpitations.  Skin: Negative for rash.  Neurological: Negative for tremors.  Psychiatric/Behavioral: Negative for substance abuse.    Last menstrual period 01/09/2019.There is no height or weight on file to calculate BMI.  General Appearance: Casual  Eye Contact:  Fair  Speech:  Normal Rate  Volume:  Decreased  Mood:  fair  Affect:  congruent  Thought Process:  Goal Directed  Orientation:  Full (Time, Place, and Person)  Thought Content:  Rumination  Suicidal Thoughts:  No  Homicidal Thoughts:  No  Memory:  Immediate;   Fair Recent;   Fair  Judgement:  Fair  Insight:  Shallow  Psychomotor Activity:  Decreased  Concentration:  Concentration: Fair and Attention Span: Fair  Recall:  [employer] of Knowledge:Fair  Language: Fair  Akathisia:  Negative  Handed:  Right  AIMS (if indicated):    Assets:  Social Support  ADL's:  Intact  Cognition: WNL  Sleep:  fair    Treatment Plan Summary: Medication management and Plan as follows  1. Major depression recurrent :fair. Continue  wellbutrin.  2. SOcial anxiety: better continue lexapro. Provided grief therapy. buspar prn I discussed the assessment and treatment plan with the patient. The patient was provided an opportunity to ask questions and all were answered. The patient agreed with the plan and demonstrated an understanding of the instructions.   The patient was advised to call back or seek an in-person evaluation if the symptoms worsen or if the condition fails to improve as anticipated.  I provided 15  minutes of non-face-to-face time during this encounter. Fu68m.   Merian Capron, MD 6/17/20208:42 AM

## 2019-01-22 ENCOUNTER — Ambulatory Visit (HOSPITAL_COMMUNITY): Payer: Self-pay | Admitting: Psychiatry

## 2019-01-22 MED FILL — CHLORHEXIDINE 0.12% RINSE: 0.12 | 30 days supply | Qty: 473 | Fill #0

## 2019-02-28 MED FILL — VIT D3-50 50,000 UNITS CAPS: 1.25 MG | 84 days supply | Qty: 12 | Fill #0

## 2019-03-13 MED FILL — VIT D3-50 50,000 UNITS CAPS: 1.25 MG | 84 days supply | Qty: 12 | Fill #0

## 2019-03-15 ENCOUNTER — Encounter: Payer: Self-pay | Admitting: Gastroenterology

## 2019-03-22 ENCOUNTER — Encounter: Payer: Self-pay | Admitting: Gastroenterology

## 2019-03-22 ENCOUNTER — Ambulatory Visit: Payer: No Typology Code available for payment source | Admitting: Gastroenterology

## 2019-03-22 VITALS — BP 120/86 | HR 82 | Temp 97.8°F | Ht 68.5 in | Wt 230.6 lb

## 2019-03-22 DIAGNOSIS — R1010 Upper abdominal pain, unspecified: Secondary | ICD-10-CM

## 2019-03-22 DIAGNOSIS — R110 Nausea: Secondary | ICD-10-CM | POA: Diagnosis not present

## 2019-03-22 NOTE — Progress Notes (Signed)
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  She had the gradual onset of upper abdominal aching, bloating and gassiness with intermittent nausea but only a few episodes of vomiting.  She denies dysphagia, odynophagia or early satiety.  Weight went down a bit when the symptoms first started, but now stable.  PCP put her on once daily Prilosec OTC, which seems to have helped quite a bit.  She has not been having heartburn or regurgitation, but there is frequent belching.  She has had many years of chronic constipation with no bleeding.  Kayda has felt under increased stress over this year due to work, COVID, and the sudden loss of her father from a bleeding ulcer in February.  She has been helping to care for her mother and another extended family member. She is on iron for a reported chronic anemia, no labs accompany the referral.  ROS:  Review of Systems  Constitutional: Negative for appetite change and unexpected weight change.  HENT: Negative for mouth sores and voice change.   Eyes: Negative for pain and redness.  Respiratory: Negative for cough and shortness of breath.   Cardiovascular: Negative for chest pain and palpitations.  Genitourinary: Negative for dysuria and hematuria.  Musculoskeletal: Negative for  arthralgias and myalgias.  Skin: Negative for pallor and rash.  Neurological: Negative for weakness and headaches.  Hematological: Negative for adenopathy.  Psychiatric/Behavioral:       Depression     Past Medical History: Past Medical History:  Diagnosis Date  . Anemia   . Anxiety   . Depression   . GERD (gastroesophageal reflux disease)   . Headache   . Hypertension   . Meningioma ([place])    Most recent behavioral health note reviewed.  Depression with social anxiety, grief over loss of her father from an acute illness earlier this year.  Past Surgical History: Past Surgical History:  Procedure Laterality Date  . CRANIOTOMY  05/26/2016   Procedure: SUBOCCIPITAL CRANIOTOMY WITH PLACEMENT OF VENTRICULAR CATHETER;  Surgeon: Ashok Pall, MD;  Location: [place];  Service: Neurosurgery;;  [place]  . WISDOM TOOTH EXTRACTION       Family History: Family History  Problem Relation Age of Onset  . Hypertension Mother   . Heart disease Mother   . Hypertension Father   . Kidney disease Father   . Ulcers Father   . Diabetes Father        many paternal family members also with diabetes  . Ovarian cancer Maternal Grandmother   . Cirrhosis Paternal Grandfather        alcohol related  . Thyroid cancer Maternal Aunt   . Colon cancer Neg Hx   . Esophageal cancer Neg Hx   . Pancreatic cancer Neg Hx   . Stomach cancer Neg Hx     Social History: Social History   Socioeconomic History  .  Marital status: Single    Spouse name: Not on file  . Number of children: Not on file  . Years of education: Not on file  . Highest education level: Not on file  Occupational History  . 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     Employer: [employer]  Social Needs  . Financial resource strain: Not on file  . Food insecurity    Worry: Not on file    Inability: Not on file  . Transportation needs    Medical: Not on file    Non-medical: Not on file  Tobacco Use  .  Smoking status: Never Smoker  . Smokeless tobacco: Never Used  Substance and Sexual Activity  . Alcohol use: Yes    Comment: socially 1/2 glass  . Drug use: No  . Sexual activity: Yes  Lifestyle  . Physical activity    Days per week: Not on file    Minutes per session: Not on file  . Stress: Not on file  Relationships  . Social Herbalist on phone: Not on file    Gets together: Not on file    Attends religious service: Not on file    Active member of club or organization: Not on file    Attends meetings of clubs or organizations: Not on file    Relationship status: Not on file  Other Topics Concern  . Not on file  Social History Narrative  . Not on file    Allergies: No Known Allergies  Outpatient Meds: Current Outpatient Medications  Medication Sig Dispense Refill  . atenolol (TENORMIN) 50 MG tablet Take 50 mg by mouth daily.    Marland Kitchen buPROPion (WELLBUTRIN SR) 150 MG 12 hr tablet Take 1 tablet (150 mg total) by mouth daily. 30 tablet 2  . Cholecalciferol (VITAMIN D3) 50000 units TABS Take by mouth. Once per week    . escitalopram (LEXAPRO) 20 MG tablet Take 2 tablets (40 mg total) by mouth daily. 60 tablet 2  . ferrous sulfate 325 (65 FE) MG tablet Take 325 mg by mouth daily with breakfast.    . hydrochlorothiazide (HYDRODIURIL) 12.5 MG tablet Take 12.5 mg by mouth daily.    Marland Kitchen ibuprofen (ADVIL,MOTRIN) 200 MG tablet Take 200-400 mg by mouth 2 (two) times daily as needed for headache or moderate pain.    Marland Kitchen omeprazole (PRILOSEC OTC) 20 MG tablet Take 20 mg by mouth daily.     No current facility-administered medications for this visit.       ___________________________________________________________________ Objective   Exam:  BP 120/86   Pulse 82   Temp 97.8 F (36.6 C)   Ht 5' 8.5" (1.74 m)   Wt 230 lb 9.6 oz (104.6 kg)   BMI 34.55 kg/m    General: Well-appearing, pleasant and conversational  Eyes: sclera anicteric, no redness  ENT: oral mucosa  moist without lesions, no cervical or supraclavicular lymphadenopathy  CV: RRR without murmur, S1/S2, no JVD, no peripheral edema  Resp: clear to auscultation bilaterally, normal RR and effort noted  GI: soft, no tenderness, with active bowel sounds. No guarding or palpable organomegaly noted.  Skin; warm and dry, no rash or jaundice noted  Neuro: awake, alert and oriented x 3. Normal gross motor function and fluent speech  No data for review She does not recall any abdominal imaging  Assessment: Encounter Diagnoses  Name Primary?  Marland Kitchen Upper abdominal pain Yes  . Nausea     Several weeks of nonspecific  symptoms.  Difficult to know whether or not related to father's history, we discussed possibility of H. pylori.  She uses NSAIDs rarely, rarely has alcohol and is a non-smoker.  She has become increasingly concerned about the symptoms even though they are better with Prilosec.  Plan:  Upper endoscopy.  She is agreeable after discussion of procedure and risks.  The benefits and risks of the planned procedure were described in detail with the patient or (when appropriate) their health care proxy.  Risks were outlined as including, but not limited to, bleeding, infection, perforation, adverse medication reaction leading to cardiac or pulmonary decompensation.  The limitation of incomplete mucosal visualization was also discussed.  No guarantees or warranties were given.  Decrease Prilosec to every other day to see what happens with symptoms.  Thank you for the courtesy of this consult.  Please call me with any questions or concerns.  Nelida Meuse III  CC: Referring provider noted above

## 2019-03-22 NOTE — Patient Instructions (Signed)
If you are age 49 or older, your body mass index should be between 23-30. Your Body mass index is 34.55 kg/m. If this is out of the aforementioned range listed, please consider follow up with your Primary Care Provider.  If you are age 79 or younger, your body mass index should be between 19-25. Your Body mass index is 34.55 kg/m. If this is out of the aformentioned range listed, please consider follow up with your Primary Care Provider.   You have been scheduled for an endoscopy. Please follow written instructions given to you at your visit today. If you use inhalers (even only as needed), please bring them with you on the day of your procedure. Your physician has requested that you go to www.startemmi.com and enter the access code given to you at your visit today. This web site gives a general overview about your procedure. However, you should still follow specific instructions given to you by our office regarding your preparation for the procedure.  It was a pleasure to see you today!  Dr. Loletha Carrow

## 2019-03-28 ENCOUNTER — Encounter: Payer: Self-pay | Admitting: Gastroenterology

## 2019-03-29 ENCOUNTER — Telehealth: Payer: Self-pay | Admitting: Gastroenterology

## 2019-03-29 NOTE — Telephone Encounter (Signed)
I understand, thanks very much.

## 2019-04-01 ENCOUNTER — Encounter: Payer: No Typology Code available for payment source | Admitting: Gastroenterology

## 2019-04-24 MED FILL — ESCITALOPRAM 20 MG TABLET: 20 | 30 days supply | Qty: 60 | Fill #1

## 2019-04-24 MED FILL — BUPROPION HCL ER (SR) 150 M: 150 | 30 days supply | Qty: 30 | Fill #1

## 2019-05-14 ENCOUNTER — Ambulatory Visit (HOSPITAL_COMMUNITY): Payer: No Typology Code available for payment source | Admitting: Psychiatry

## 2019-09-13 MED FILL — HYDROCHLOROTHIAZIDE 12.5 MG: 12.5 | 90 days supply | Qty: 90 | Fill #1

## 2019-09-13 MED FILL — BUPROPION HCL ER (SR) 150 M: 150 | 30 days supply | Qty: 30 | Fill #2

## 2019-09-13 MED FILL — ATENOLOL 50 MG TABLET: 50 | 90 days supply | Qty: 90 | Fill #1

## 2019-09-13 MED FILL — ESCITALOPRAM 20 MG TABLET: 20 | 30 days supply | Qty: 60 | Fill #2

## 2020-02-05 MED FILL — PREVIDENT 0.2% RINSE: 0.2 | 47 days supply | Qty: 473 | Fill #0

## 2020-03-03 ENCOUNTER — Telehealth (HOSPITAL_COMMUNITY): Payer: Self-pay | Admitting: Psychiatry

## 2020-03-03 MED ORDER — ESCITALOPRAM OXALATE 20 MG PO TABS: 40.0000 mg | ORAL_TABLET | Freq: Every day | ORAL | 0 refills | Status: DC

## 2020-03-03 MED ORDER — BUPROPION HCL ER (SR) 150 MG PO TB12: 150.0000 mg | ORAL_TABLET | Freq: Every day | ORAL | 0 refills | Status: DC

## 2020-03-03 MED FILL — BUPROPION HCL ER (SR) 150 M: 150 | 30 days supply | Qty: 30 | Fill #0

## 2020-03-03 MED FILL — ESCITALOPRAM 20 MG TABLET: 20 | 30 days supply | Qty: 60 | Fill #0

## 2020-03-03 NOTE — Telephone Encounter (Signed)
Patient last seen you 12/2018 She made a 13yr follow up apt on 8/24 She would like a refill on meds to last until next apt Wellbutrin and lexapro [facility]    [phone]

## 2020-03-03 NOTE — Telephone Encounter (Signed)
She was supposed to FU in 88months not an year. I have send refills this time, she is to keep her appointment otherwise can be discharged

## 2020-03-04 ENCOUNTER — Encounter (INDEPENDENT_AMBULATORY_CARE_PROVIDER_SITE_OTHER): Payer: Self-pay

## 2020-03-06 MED FILL — VITAMIN D-3 2,000 UNIT TAB: 50 MCG | 100 days supply | Qty: 200 | Fill #0

## 2020-03-24 ENCOUNTER — Encounter (HOSPITAL_COMMUNITY): Payer: Self-pay | Admitting: Psychiatry

## 2020-03-24 ENCOUNTER — Other Ambulatory Visit (HOSPITAL_COMMUNITY): Payer: Self-pay | Admitting: Psychiatry

## 2020-03-24 ENCOUNTER — Telehealth (INDEPENDENT_AMBULATORY_CARE_PROVIDER_SITE_OTHER): Payer: No Typology Code available for payment source | Admitting: Psychiatry

## 2020-03-24 DIAGNOSIS — F4010 Social phobia, unspecified: Secondary | ICD-10-CM

## 2020-03-24 DIAGNOSIS — F332 Major depressive disorder, recurrent severe without psychotic features: Secondary | ICD-10-CM | POA: Diagnosis not present

## 2020-03-24 MED ORDER — ESCITALOPRAM OXALATE 20 MG PO TABS: 40.0000 mg | ORAL_TABLET | Freq: Every day | ORAL | 1 refills | Status: DC

## 2020-03-24 MED ORDER — BUPROPION HCL ER (SR) 150 MG PO TB12: 150.0000 mg | ORAL_TABLET | Freq: Every day | ORAL | 1 refills | Status: DC

## 2020-03-24 NOTE — Progress Notes (Signed)
[facility] Outpatient Follow up visit  Patient Identification: Alexa [name] MRN:  [id number] Date of Evaluation:  03/24/2020 Referral Source: Counsellor Chief Complaint:   depression follow up . Not seen for more then a year Visit Diagnosis:    ICD-10-CM   1. Severe episode of recurrent major depressive disorder, without psychotic features ([place])  F33.2   2. Social anxiety disorder  F40.10    I connected with [name] on 03/24/20 at  2:00 PM EDT by telephone and verified that I am speaking with the correct person using two identifiers.  I discussed the limitations of evaluation and management by telemedicine and the availability of in person appointments. The patient expressed understanding and agreed to proceed. Patient location parked car Provider location home office  History of Present Illness:  49  years old currently single African-American female initially referred by our counselor for management of depression and anxiety. Works with [employer]  Was doing fair last visit, was a NS and not seen for more then a year now  Says she was doing fair so didn't refill and got off meds Recently started feeling different, subdued and anxious, have got a refill so started back on lexapro  Stress related to having Aunt who has alzheimers  Lost Dad in 2018-10-10  sudden death due to bleeding ulcer. Handling grief ok Social anxiety per history , lexapro helps but was off for it so felt anxious   Aggravating factors. Brain tumor. Social anxiety Modifying factors :family Severity of depression: has gone worse without meds  Context: amotivation, grief Duration: most of her adult life    Past Psychiatric History: SSRI treatment for depression and anxiety  Previous Psychotropic Medications: Yes   Substance Abuse History in the last 12 months:  No.  Consequences of Substance Abuse: NA  Past Medical History:  Past Medical History:  Diagnosis Date   . Anemia   . Anxiety   . Depression   . GERD (gastroesophageal reflux disease)   . Headache   . Hypertension   . Meningioma [facility])     Past Surgical History:  Procedure Laterality Date  . CRANIOTOMY  05/26/2016   Procedure: SUBOCCIPITAL CRANIOTOMY WITH PLACEMENT OF VENTRICULAR CATHETER;  Surgeon: Ashok Pall, MD;  Location: [place];  Service: Neurosurgery;;  [place]  . WISDOM TOOTH EXTRACTION      Family Psychiatric History: father side : depression most family members  Family History:  Family History  Problem Relation Age of Onset  . Hypertension Mother   . Heart disease Mother   . Hypertension Father   . Kidney disease Father   . Ulcers Father   . Diabetes Father        many paternal family members also with diabetes  . Ovarian cancer Maternal Grandmother   . Cirrhosis Paternal Grandfather        alcohol related  . Thyroid cancer Maternal Aunt   . Colon cancer Neg Hx   . Esophageal cancer Neg Hx   . Pancreatic cancer Neg Hx   . Stomach cancer Neg Hx     Social History:   Social History   Socioeconomic History  . Marital status: Single    Spouse name: Not on file  . Number of children: Not on file  . Years of education: Not on file  . Highest education level: Not on file  Occupational History  . 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     Employer: [employer]  Tobacco Use  . Smoking status: Never Smoker  . Smokeless tobacco: Never Used  Vaping Use  . Vaping Use: Never used  Substance and Sexual Activity  . Alcohol use: Yes    Comment: socially 1/2 glass  . Drug use: No  . Sexual activity: Yes  Other Topics Concern  . Not on file  Social History Narrative  . Not on file   Social Determinants of Health   Financial Resource Strain:   . Difficulty of Paying Living Expenses: Not on file  Food Insecurity:   . Worried About Charity fundraiser in the Last Year: Not on file  . Ran Out of Food in the Last Year: Not on file   Transportation Needs:   . Lack of Transportation (Medical): Not on file  . Lack of Transportation (Non-Medical): Not on file  Physical Activity:   . Days of Exercise per Week: Not on file  . Minutes of Exercise per Session: Not on file  Stress:   . Feeling of Stress : Not on file  Social Connections:   . Frequency of Communication with Friends and Family: Not on file  . Frequency of Social Gatherings with Friends and Family: Not on file  . Attends Religious Services: Not on file  . Active Member of Clubs or Organizations: Not on file  . Attends Archivist Meetings: Not on file  . Marital Status: Not on file      Allergies:  No Known Allergies  Metabolic Disorder Labs: No results found for: HGBA1C, MPG No results found for: PROLACTIN No results found for: CHOL, TRIG, HDL, CHOLHDL, VLDL, LDLCALC   Current Medications: Current Outpatient Medications  Medication Sig Dispense Refill  . atenolol (TENORMIN) 50 MG tablet Take 50 mg by mouth daily.    Marland Kitchen buPROPion (WELLBUTRIN SR) 150 MG 12 hr tablet Take 1 tablet (150 mg total) by mouth daily. 30 tablet 1  . Cholecalciferol (VITAMIN D3) 50000 units TABS Take by mouth. Once per week    . escitalopram (LEXAPRO) 20 MG tablet Take 2 tablets (40 mg total) by mouth daily. 60 tablet 1  . ferrous sulfate 325 (65 FE) MG tablet Take 325 mg by mouth daily with breakfast.    . hydrochlorothiazide (HYDRODIURIL) 12.5 MG tablet Take 12.5 mg by mouth daily.    Marland Kitchen ibuprofen (ADVIL,MOTRIN) 200 MG tablet Take 200-400 mg by mouth 2 (two) times daily as needed for headache or moderate pain.    Marland Kitchen omeprazole (PRILOSEC OTC) 20 MG tablet Take 20 mg by mouth daily.     No current facility-administered medications for this visit.      Psychiatric Specialty Exam: Review of Systems  Cardiovascular: Negative for chest pain and palpitations.  Skin: Negative for rash.  Neurological: Negative for tremors.  Psychiatric/Behavioral: Positive for  depression. Negative for substance abuse.    There were no vitals taken for this visit.There is no height or weight on file to calculate BMI.  General Appearance: Casual  Eye Contact:  Fair  Speech:  Normal Rate  Volume:  Decreased  Mood: subdued  Affect:  congruent  Thought Process:  Goal Directed  Orientation:  Full (Time, Place, and Person)  Thought Content:  Rumination  Suicidal Thoughts:  No  Homicidal Thoughts:  No  Memory:  Immediate;   Fair Recent;   Fair  Judgement:  Fair  Insight:  Shallow  Psychomotor Activity:  Decreased  Concentration:  Concentration: Fair and Attention Span: Fair  Recall:  Hooks: Fair  Akathisia:  Negative  Handed:  Right  AIMS (if indicated):    Assets:  Social Support  ADL's:  Intact  Cognition: WNL  Sleep:  fair    Treatment Plan Summary: Medication management and Plan as follows  1. Major depression recurrent : has gone depressed with out meds, have started lexapro can increase to 40mg  Have restarted wellbutrin, continue 2. SOcial anxiety: got anxious without meds, started back on lexapro, continue  Adjustment disorder: family needs is a stress, adjusting to it,continue lexapro, consider therapy if needed Discussed compliance  I discussed the assessment and treatment plan with the patient. The patient was provided an opportunity to ask questions and all were answered. The patient agreed with the plan and demonstrated an understanding of the instructions.   The patient was advised to call back or seek an in-person evaluation if the symptoms worsen or if the condition fails to improve as anticipated.  I provided 15- 20   minutes of non-face-to-face time during this encounter. Fu 72m.or learlier If needed Merian Capron, MD 8/24/20212:07 PM

## 2020-05-11 ENCOUNTER — Other Ambulatory Visit (HOSPITAL_BASED_OUTPATIENT_CLINIC_OR_DEPARTMENT_OTHER): Payer: Self-pay | Admitting: Internal Medicine

## 2020-05-11 MED FILL — IBUPROFEN 600 MG TABLET: 600 | 3 days supply | Qty: 12 | Fill #0

## 2020-05-11 MED FILL — ESCITALOPRAM 20 MG TABLET: 20 | 30 days supply | Qty: 60 | Fill #0

## 2020-05-11 MED FILL — BUPROPION HCL ER (SR) 150 M: 150 | 30 days supply | Qty: 30 | Fill #0

## 2020-05-12 MED FILL — ATENOLOL 50 MG TABS: 50 | 90 days supply | Qty: 90 | Fill #0

## 2020-05-12 MED FILL — HYDROCHLOROTHIAZIDE 12.5 MG: 12.5 | 90 days supply | Qty: 90 | Fill #0

## 2020-05-20 ENCOUNTER — Other Ambulatory Visit: Payer: Self-pay | Admitting: Internal Medicine

## 2020-05-20 DIAGNOSIS — Z1231 Encounter for screening mammogram for malignant neoplasm of breast: Secondary | ICD-10-CM

## 2020-05-25 ENCOUNTER — Other Ambulatory Visit: Payer: Self-pay | Admitting: Internal Medicine

## 2020-05-25 DIAGNOSIS — N644 Mastodynia: Secondary | ICD-10-CM

## 2020-06-17 ENCOUNTER — Ambulatory Visit
Admission: RE | Admit: 2020-06-17 | Discharge: 2020-06-17 | Disposition: A | Discharge status: Home / Self Care | Payer: No Typology Code available for payment source | Source: Ambulatory Visit | Attending: Internal Medicine | Admitting: Internal Medicine

## 2020-06-17 ENCOUNTER — Other Ambulatory Visit: Payer: Self-pay

## 2020-06-17 ENCOUNTER — Ambulatory Visit: Payer: No Typology Code available for payment source

## 2020-06-17 DIAGNOSIS — N644 Mastodynia: Secondary | ICD-10-CM

## 2020-06-18 ENCOUNTER — Telehealth (HOSPITAL_COMMUNITY): Payer: No Typology Code available for payment source | Admitting: Psychiatry

## 2020-07-01 ENCOUNTER — Telehealth (HOSPITAL_COMMUNITY): Payer: No Typology Code available for payment source | Admitting: Psychiatry

## 2020-07-02 ENCOUNTER — Encounter (HOSPITAL_COMMUNITY): Payer: Self-pay | Admitting: Psychiatry

## 2020-07-02 ENCOUNTER — Telehealth (INDEPENDENT_AMBULATORY_CARE_PROVIDER_SITE_OTHER): Payer: No Typology Code available for payment source | Admitting: Psychiatry

## 2020-07-02 DIAGNOSIS — F4010 Social phobia, unspecified: Secondary | ICD-10-CM | POA: Diagnosis not present

## 2020-07-02 DIAGNOSIS — F332 Major depressive disorder, recurrent severe without psychotic features: Secondary | ICD-10-CM | POA: Diagnosis not present

## 2020-07-02 MED ORDER — ESCITALOPRAM OXALATE 20 MG PO TABS
40.0000 mg | ORAL_TABLET | Freq: Every day | ORAL | 1 refills | Status: DC
Start: 2020-07-02 — End: 2020-11-02

## 2020-07-02 MED ORDER — BUPROPION HCL ER (SR) 150 MG PO TB12: 150.0000 mg | ORAL_TABLET | Freq: Every day | ORAL | 1 refills | Status: DC

## 2020-07-02 MED FILL — ESCITALOPRAM 20 MG TABLET: 20 | 30 days supply | Qty: 60 | Fill #0

## 2020-07-02 MED FILL — BUPROPION HCL ER (SR) 150 M: 150 | 30 days supply | Qty: 30 | Fill #0

## 2020-07-02 NOTE — Progress Notes (Signed)
[facility] Outpatient Follow up visit  Patient Identification: Alexa [name] MRN:  [id number] Date of Evaluation:  07/02/2020 Referral Source: Counsellor Chief Complaint:   depression follow up . Visit Diagnosis:    ICD-10-CM   1. Severe episode of recurrent major depressive disorder, without psychotic features ([place])  F33.2   2. Social anxiety disorder  F40.10     I connected with [name] on 07/02/20 at  1:00 PM EST by telephone and verified that I am speaking with the correct person using two identifiers. I discussed the limitations of evaluation and management by telemedicine and the availability of in person appointments. The patient expressed understanding and agreed to proceed. Patient location parked car Provider location :office  History of Present Illness:  49  years old currently single African-American female initially referred by our counselor for management of depression and anxiety. Works with [place] [facility]/s[facility] unit   Doing fair since back on lexapro and wellbutrin  Stress related to having Aunt who has alzheimers  Lost Dad in October 21, 2018  sudden death due to bleeding ulcer. Handling grief ok Social anxiety per history , lexapro helps   Aggravating factors. Brain tumor. Social anxiety Modifying factors :family Severity of depression: has gone worse without meds  Context: amotivation, grief Duration: most of her adult life    Past Psychiatric History: SSRI treatment for depression and anxiety  Previous Psychotropic Medications: Yes   Substance Abuse History in the last 12 months:  No.  Consequences of Substance Abuse: NA  Past Medical History:  Past Medical History:  Diagnosis Date  . Anemia   . Anxiety   . Depression   . GERD (gastroesophageal reflux disease)   . Headache   . Hypertension   . Meningioma [facility])     Past Surgical History:  Procedure Laterality Date  . CRANIOTOMY  05/26/2016   Procedure: SUBOCCIPITAL  CRANIOTOMY WITH PLACEMENT OF VENTRICULAR CATHETER;  Surgeon: Ashok Pall, MD;  Location: [place];  Service: Neurosurgery;;  [place]  . WISDOM TOOTH EXTRACTION      Family Psychiatric History: father side : depression most family members  Family History:  Family History  Problem Relation Age of Onset  . Hypertension Mother   . Heart disease Mother   . Hypertension Father   . Kidney disease Father   . Ulcers Father   . Diabetes Father        many paternal family members also with diabetes  . Ovarian cancer Maternal Grandmother   . Cirrhosis Paternal Grandfather        alcohol related  . Thyroid cancer Maternal Aunt   . Colon cancer Neg Hx   . Esophageal cancer Neg Hx   . Pancreatic cancer Neg Hx   . Stomach cancer Neg Hx     Social History:   Social History   Socioeconomic History  . Marital status: Single    Spouse name: Not on file  . Number of children: Not on file  . Years of education: Not on file  . Highest education level: Not on file  Occupational History  . 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     Employer:   Tobacco Use  . Smoking status: Never Smoker  . Smokeless tobacco: Never Used  Vaping Use  . Vaping Use: Never used  Substance and Sexual Activity  . Alcohol use: Yes    Comment: socially 1/2 glass  . Drug use: No  . Sexual activity: Yes  Other Topics Concern  . Not on file  Social History Narrative  . Not on file   Social Determinants of Health   Financial Resource Strain:   . Difficulty of Paying Living Expenses: Not on file  Food Insecurity:   . Worried About Charity fundraiser in the Last Year: Not on file  . Ran Out of Food in the Last Year: Not on file  Transportation Needs:   . Lack of Transportation (Medical): Not on file  . Lack of Transportation (Non-Medical): Not on file  Physical Activity:   . Days of Exercise per Week: Not on file  . Minutes of Exercise per Session: Not on file  Stress:   .  Feeling of Stress : Not on file  Social Connections:   . Frequency of Communication with Friends and Family: Not on file  . Frequency of Social Gatherings with Friends and Family: Not on file  . Attends Religious Services: Not on file  . Active Member of Clubs or Organizations: Not on file  . Attends Archivist Meetings: Not on file  . Marital Status: Not on file      Allergies:  No Known Allergies  Metabolic Disorder Labs: No results found for: HGBA1C, MPG No results found for: PROLACTIN No results found for: CHOL, TRIG, HDL, CHOLHDL, VLDL, LDLCALC   Current Medications: Current Outpatient Medications  Medication Sig Dispense Refill  . atenolol (TENORMIN) 50 MG tablet Take 50 mg by mouth daily.    Marland Kitchen buPROPion (WELLBUTRIN SR) 150 MG 12 hr tablet Take 1 tablet (150 mg total) by mouth daily. 30 tablet 1  . Cholecalciferol (VITAMIN D3) 50000 units TABS Take by mouth. Once per week    . escitalopram (LEXAPRO) 20 MG tablet Take 2 tablets (40 mg total) by mouth daily. 60 tablet 1  . ferrous sulfate 325 (65 FE) MG tablet Take 325 mg by mouth daily with breakfast.    . hydrochlorothiazide (HYDRODIURIL) 12.5 MG tablet Take 12.5 mg by mouth daily.    Marland Kitchen ibuprofen (ADVIL,MOTRIN) 200 MG tablet Take 200-400 mg by mouth 2 (two) times daily as needed for headache or moderate pain.    Marland Kitchen omeprazole (PRILOSEC OTC) 20 MG tablet Take 20 mg by mouth daily.     No current facility-administered medications for this visit.      Psychiatric Specialty Exam: Review of Systems  Cardiovascular: Negative for chest pain.  Psychiatric/Behavioral: Negative for substance abuse.    There were no vitals taken for this visit.There is no height or weight on file to calculate BMI.  General Appearance: Casual  Eye Contact:  Fair  Speech:  Normal Rate  Volume:  Decreased  Mood: better  Affect:  congruent  Thought Process:  Goal Directed  Orientation:  Full (Time, Place, and Person)  Thought  Content:  Rumination  Suicidal Thoughts:  No  Homicidal Thoughts:  No  Memory:  Immediate;   Fair Recent;   Fair  Judgement:  Fair  Insight:  Shallow  Psychomotor Activity:  Decreased  Concentration:  Concentration: Fair and Attention Span: Fair  Recall:  [employer] of Knowledge:Fair  Language: Fair  Akathisia:  Negative  Handed:  Right  AIMS (if indicated):    Assets:  Social Support  ADL's:  Intact  Cognition: WNL  Sleep:  fair    Treatment Plan Summary: Medication management and Plan as follows  1. Major depression recurrent : fair, continue wellbutrin 2. SOcial anxiety: manageable, continue lexapro Adjustment disorder:  fair,   I discussed the assessment and treatment plan with the patient. The patient was provided an opportunity to ask questions and all were answered. The patient agreed with the plan and demonstrated an understanding of the instructions.   The patient was advised to call back or seek an in-person evaluation if the symptoms worsen or if the condition fails to improve as anticipated.  I provided 15- 20   minutes of non-face-to-face time during this encounter. Fu 4 .or learlier If needed Merian Capron, MD 12/2/20211:10 PM

## 2020-07-03 ENCOUNTER — Encounter (INDEPENDENT_AMBULATORY_CARE_PROVIDER_SITE_OTHER): Payer: Self-pay

## 2020-07-21 ENCOUNTER — Ambulatory Visit: Payer: No Typology Code available for payment source

## 2020-07-21 ENCOUNTER — Ambulatory Visit
Admission: RE | Admit: 2020-07-21 | Discharge: 2020-07-21 | Disposition: A | Discharge status: Home / Self Care | Payer: No Typology Code available for payment source | Source: Ambulatory Visit | Attending: Internal Medicine | Admitting: Internal Medicine

## 2020-07-21 ENCOUNTER — Other Ambulatory Visit: Payer: Self-pay

## 2020-09-14 MED FILL — ESCITALOPRAM 20 MG TABLET: 20 | 30 days supply | Qty: 60 | Fill #1

## 2020-09-14 MED FILL — BUPROPION HCL ER (SR) 150 M: 150 | 30 days supply | Qty: 30 | Fill #1

## 2020-10-31 ENCOUNTER — Other Ambulatory Visit (HOSPITAL_BASED_OUTPATIENT_CLINIC_OR_DEPARTMENT_OTHER): Payer: Self-pay

## 2020-11-02 ENCOUNTER — Telehealth (INDEPENDENT_AMBULATORY_CARE_PROVIDER_SITE_OTHER): Payer: No Typology Code available for payment source | Admitting: Psychiatry

## 2020-11-02 ENCOUNTER — Other Ambulatory Visit (HOSPITAL_BASED_OUTPATIENT_CLINIC_OR_DEPARTMENT_OTHER): Payer: Self-pay

## 2020-11-02 ENCOUNTER — Encounter (HOSPITAL_COMMUNITY): Payer: Self-pay | Admitting: Psychiatry

## 2020-11-02 DIAGNOSIS — F4010 Social phobia, unspecified: Secondary | ICD-10-CM

## 2020-11-02 DIAGNOSIS — F332 Major depressive disorder, recurrent severe without psychotic features: Secondary | ICD-10-CM | POA: Diagnosis not present

## 2020-11-02 MED ORDER — ESCITALOPRAM OXALATE 20 MG PO TABS
40.0000 mg | ORAL_TABLET | Freq: Every day | ORAL | 1 refills | Status: DC
Start: 2020-11-02 — End: 2021-08-31
  Filled 2020-11-02 – 2021-01-01 (×2): qty 60, 30d supply, fill #0
  Filled 2021-03-31: qty 60, 30d supply, fill #1

## 2020-11-02 MED ORDER — BUPROPION HCL ER (SR) 150 MG PO TB12
150.0000 mg | ORAL_TABLET | Freq: Every day | ORAL | 1 refills | Status: DC
  Filled 2020-11-02 – 2021-01-01 (×2): qty 30, 30d supply, fill #0

## 2020-11-02 NOTE — Progress Notes (Signed)
[facility] Outpatient Follow up visit  Patient Identification: Alexa [name] MRN:  [id number] Date of Evaluation:  11/02/2020 Referral Source: Counsellor Chief Complaint:   depression follow up . Visit Diagnosis:    ICD-10-CM   1. Severe episode of recurrent major depressive disorder, without psychotic features ([place])  F33.2   2. Social anxiety disorder  F40.10     Virtual Visit via Video Note  I connected with [name] on 11/02/20 at  1:30 PM EDT by a video enabled telemedicine application and verified that I am speaking with the correct person using two identifiers.  Location: Patient: parked car Provider: home office   I discussed the limitations of evaluation and management by telemedicine and the availability of in person appointments. The patient expressed understanding and agreed to proceed.      I discussed the assessment and treatment plan with the patient. The patient was provided an opportunity to ask questions and all were answered. The patient agreed with the plan and demonstrated an understanding of the instructions.   The patient was advised to call back or seek an in-person evaluation if the symptoms worsen or if the condition fails to improve as anticipated.  I provided 10  minutes of non-face-to-face time during this encounter including chart review and documentation    History of Present Illness:  50  years old currently single African-American female initially referred by our counselor for management of depression and anxiety. Works with [place] [facility]   Doing fair on meds, lexapro Still has social anxiety and working on it with distraction    Aggravating factors. Brain tumor. Social anxiety Modifying factors : family Severity of depression: manageable    Past Psychiatric History: SSRI treatment for depression and anxiety  Previous Psychotropic Medications: Yes   Substance Abuse History in the last 12 months:   No.  Consequences of Substance Abuse: NA  Past Medical History:  Past Medical History:  Diagnosis Date  . Anemia   . Anxiety   . Depression   . GERD (gastroesophageal reflux disease)   . Headache   . Hypertension   . Meningioma [facility])     Past Surgical History:  Procedure Laterality Date  . CRANIOTOMY  05/26/2016   Procedure: SUBOCCIPITAL CRANIOTOMY WITH PLACEMENT OF VENTRICULAR CATHETER;  Surgeon: Ashok Pall, MD;  Location: [place];  Service: Neurosurgery;;  [place]  . WISDOM TOOTH EXTRACTION      Family Psychiatric History: father side : depression most family members  Family History:  Family History  Problem Relation Age of Onset  . Hypertension Mother   . Heart disease Mother   . Hypertension Father   . Kidney disease Father   . Ulcers Father   . Diabetes Father        many paternal family members also with diabetes  . Ovarian cancer Maternal Grandmother   . Cirrhosis Paternal Grandfather        alcohol related  . Thyroid cancer Maternal Aunt   . Colon cancer Neg Hx   . Esophageal cancer Neg Hx   . Pancreatic cancer Neg Hx   . Stomach cancer Neg Hx     Social History:   Social History   Socioeconomic History  . Marital status: Single    Spouse name: Not on file  . Number of children: Not on file  . Years of education: Not on file  . Highest education level: Not on file  Occupational History  . Occupation: LPN  Vascular and Vein Specialist     Employer: [employer]  Tobacco Use  . Smoking status: Never Smoker  . Smokeless tobacco: Never Used  Vaping Use  . Vaping Use: Never used  Substance and Sexual Activity  . Alcohol use: Yes    Comment: socially 1/2 glass  . Drug use: No  . Sexual activity: Yes  Other Topics Concern  . Not on file  Social History Narrative  . Not on file   Social Determinants of Health   Financial Resource Strain: Not on file  Food Insecurity: Not on file  Transportation Needs: Not on file  Physical  Activity: Not on file  Stress: Not on file  Social Connections: Not on file      Allergies:  No Known Allergies  Metabolic Disorder Labs: No results found for: HGBA1C, MPG No results found for: PROLACTIN No results found for: CHOL, TRIG, HDL, CHOLHDL, VLDL, LDLCALC   Current Medications: Current Outpatient Medications  Medication Sig Dispense Refill  . atenolol (TENORMIN) 50 MG tablet Take 50 mg by mouth daily.    Marland Kitchen atenolol (TENORMIN) 50 MG tablet TAKE 1 TABLET BY MOUTH DAILY 90 tablet 3  . buPROPion (WELLBUTRIN SR) 150 MG 12 hr tablet Take 1 tablet (150 mg total) by mouth daily. 30 tablet 1  . Cholecalciferol (VITAMIN D3) 50000 units TABS Take by mouth. Once per week    . escitalopram (LEXAPRO) 20 MG tablet Take 2 tablets (40 mg total) by mouth daily. 60 tablet 1  . ferrous sulfate 325 (65 FE) MG tablet Take 325 mg by mouth daily with breakfast.    . hydrochlorothiazide (HYDRODIURIL) 12.5 MG tablet Take 12.5 mg by mouth daily.    . hydrochlorothiazide (HYDRODIURIL) 12.5 MG tablet TAKE 1 TABLET BY MOUTH ONCE DAILY 90 tablet 3  . ibuprofen (ADVIL) 600 MG tablet TAKE ONE TABLET BY MOUTH EVERY 6 HOURS, DO NOT EXCEED 5 TABS IN 24 HOURS. 12 tablet 0  . ibuprofen (ADVIL,MOTRIN) 200 MG tablet Take 200-400 mg by mouth 2 (two) times daily as needed for headache or moderate pain.    Marland Kitchen omeprazole (PRILOSEC OTC) 20 MG tablet Take 20 mg by mouth daily.     No current facility-administered medications for this visit.      Psychiatric Specialty Exam: Review of Systems  Cardiovascular: Negative for chest pain.  Psychiatric/Behavioral: Negative for substance abuse.    There were no vitals taken for this visit.There is no height or weight on file to calculate BMI.  General Appearance: Casual  Eye Contact:  Fair  Speech:  Normal Rate  Volume:  Decreased  Mood: fair  Affect:  congruent  Thought Process:  Goal Directed  Orientation:  Full (Time, Place, and Person)  Thought Content:   Rumination  Suicidal Thoughts:  No  Homicidal Thoughts:  No  Memory:  Immediate;   Fair Recent;   Fair  Judgement:  Fair  Insight:  Shallow  Psychomotor Activity:  Decreased  Concentration:  Concentration: Fair and Attention Span: Fair  Recall:  [employer] of Knowledge:Fair  Language: Fair  Akathisia:  Negative  Handed:  Right  AIMS (if indicated):    Assets:  Social Support  ADL's:  Intact  Cognition: WNL  Sleep:  fair    Treatment Plan Summary: Medication management and Plan as follows  1. Major depression recurrent : fair continue wellbutrin 2. SOcial anxiety:; fluctuates, continue lexapro and distractions  Fu 4 .or learlier If needed Merian Capron, MD 4/4/20221:38 PM

## 2020-11-09 ENCOUNTER — Other Ambulatory Visit (HOSPITAL_BASED_OUTPATIENT_CLINIC_OR_DEPARTMENT_OTHER): Payer: Self-pay

## 2021-01-01 ENCOUNTER — Other Ambulatory Visit (HOSPITAL_BASED_OUTPATIENT_CLINIC_OR_DEPARTMENT_OTHER): Payer: Self-pay

## 2021-01-01 MED FILL — Hydrochlorothiazide Tab 12.5 MG: ORAL | 90 days supply | Qty: 90 | Fill #0 | Status: AC

## 2021-01-01 MED FILL — Atenolol Tab 50 MG: ORAL | 90 days supply | Qty: 90 | Fill #0 | Status: AC

## 2021-01-25 ENCOUNTER — Encounter (HOSPITAL_COMMUNITY): Payer: Self-pay | Admitting: Psychiatry

## 2021-01-25 ENCOUNTER — Telehealth (INDEPENDENT_AMBULATORY_CARE_PROVIDER_SITE_OTHER): Payer: No Typology Code available for payment source | Admitting: Psychiatry

## 2021-01-25 ENCOUNTER — Other Ambulatory Visit (HOSPITAL_BASED_OUTPATIENT_CLINIC_OR_DEPARTMENT_OTHER): Payer: Self-pay

## 2021-01-25 DIAGNOSIS — F332 Major depressive disorder, recurrent severe without psychotic features: Secondary | ICD-10-CM

## 2021-01-25 DIAGNOSIS — F4010 Social phobia, unspecified: Secondary | ICD-10-CM | POA: Diagnosis not present

## 2021-01-25 MED ORDER — BUPROPION HCL ER (SR) 200 MG PO TB12
200.0000 mg | ORAL_TABLET | Freq: Every day | ORAL | 0 refills | Status: DC
  Filled 2021-01-25: qty 30, 30d supply, fill #0

## 2021-01-25 NOTE — Progress Notes (Signed)
[facility] Outpatient Follow up visit  Patient Identification: Alexa [name] MRN:  [id number] Date of Evaluation:  01/25/2021 Referral Source: Counsellor Chief Complaint:   depression follow up . Visit Diagnosis:    ICD-10-CM   1. Severe episode of recurrent major depressive disorder, without psychotic features ([place])  F33.2     2. Social anxiety disorder  F40.10      Virtual Visit via Video Note  I connected with Alexa [name] on 01/25/21 at  1:30 PM EDT by a video enabled telemedicine application and verified that I am speaking with the correct person using two identifiers.  Location: Patient: parked care Provider: home office   I discussed the limitations of evaluation and management by telemedicine and the availability of in person appointments. The patient expressed understanding and agreed to proceed.     I discussed the assessment and treatment plan with the patient. The patient was provided an opportunity to ask questions and all were answered. The patient agreed with the plan and demonstrated an understanding of the instructions.   The patient was advised to call back or seek an in-person evaluation if the symptoms worsen or if the condition fails to improve as anticipated.  I provided 15  minutes of non-face-to-face time during this encounter.   History of Present Illness:  50  years old currently single African-American female initially referred by our counselor for management of depression and anxiety. Works with [place] [facility]/s[facility]   He was doing fair last visit we kept her on Lexapro and Wellbutrin but now she is experiencing anxiety at work difficult dealing with work environment feeling down having social anxiety  But she feels tired decreased interest in things   Aggravating factors. Brain tumor. Social anxiety Modifying factors : Family Severity of depression: Subdued   Past Psychiatric History: SSRI treatment for depression and  anxiety  Previous Psychotropic Medications: Yes   Substance Abuse History in the last 12 months:  No.  Consequences of Substance Abuse: NA  Past Medical History:  Past Medical History:  Diagnosis Date   Anemia    Anxiety    Depression    GERD (gastroesophageal reflux disease)    Headache    Hypertension    Meningioma ([place])     Past Surgical History:  Procedure Laterality Date   CRANIOTOMY  05/26/2016   Procedure: SUBOCCIPITAL CRANIOTOMY WITH PLACEMENT OF VENTRICULAR CATHETER;  Surgeon: Ashok Pall, MD;  [facility];  Service: Neurosurgery;;  CRANIOTOMY - SUBOCCIPITAL   WISDOM TOOTH EXTRACTION      Family Psychiatric History: father side : depression most family members  Family History:  Family History  Problem Relation Age of Onset   Hypertension Mother    Heart disease Mother    Hypertension Father    Kidney disease Father    Ulcers Father    Diabetes Father        many paternal family members also with diabetes   Ovarian cancer Maternal Grandmother    Cirrhosis Paternal Grandfather        alcohol related   Thyroid cancer Maternal Aunt    Colon cancer Neg Hx    Esophageal cancer Neg Hx    Pancreatic cancer Neg Hx    Stomach cancer Neg Hx     Social History:   Social History   Socioeconomic History   Marital status: Single    Spouse name: Not on file   Number of children: Not on file   Years of  education: Not on file   Highest education level: Not on file  Occupational History   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     Employer: [employer]  Tobacco Use   Smoking status: Never   Smokeless tobacco: Never  Vaping Use   Vaping Use: Never used  Substance and Sexual Activity   Alcohol use: Yes    Comment: socially 1/2 glass   Drug use: No   Sexual activity: Yes  Other Topics Concern   Not on file  Social History Narrative   Not on file   Social Determinants of Health   Financial Resource Strain: Not on file  Food Insecurity: Not  on file  Transportation Needs: Not on file  Physical Activity: Not on file  Stress: Not on file  Social Connections: Not on file      Allergies:  No Known Allergies  Metabolic Disorder Labs: No results found for: HGBA1C, MPG No results found for: PROLACTIN No results found for: CHOL, TRIG, HDL, CHOLHDL, VLDL, LDLCALC   Current Medications: Current Outpatient Medications  Medication Sig Dispense Refill   atenolol (TENORMIN) 50 MG tablet Take 50 mg by mouth daily.     atenolol (TENORMIN) 50 MG tablet TAKE 1 TABLET BY MOUTH DAILY 90 tablet 3   buPROPion (WELLBUTRIN SR) 200 MG 12 hr tablet Take 1 tablet (200 mg total) by mouth daily. 30 tablet 0   Cholecalciferol (VITAMIN D3) 50000 units TABS Take by mouth. Once per week     escitalopram (LEXAPRO) 20 MG tablet Take 2 tablets (40 mg total) by mouth daily. 60 tablet 1   ferrous sulfate 325 (65 FE) MG tablet Take 325 mg by mouth daily with breakfast.     hydrochlorothiazide (HYDRODIURIL) 12.5 MG tablet Take 12.5 mg by mouth daily.     hydrochlorothiazide (HYDRODIURIL) 12.5 MG tablet TAKE 1 TABLET BY MOUTH ONCE DAILY 90 tablet 3   ibuprofen (ADVIL) 600 MG tablet TAKE ONE TABLET BY MOUTH EVERY 6 HOURS, DO NOT EXCEED 5 TABS IN 24 HOURS. 12 tablet 0   ibuprofen (ADVIL,MOTRIN) 200 MG tablet Take 200-400 mg by mouth 2 (two) times daily as needed for headache or moderate pain.     omeprazole (PRILOSEC OTC) 20 MG tablet Take 20 mg by mouth daily.     No current facility-administered medications for this visit.      Psychiatric Specialty Exam: Review of Systems  Cardiovascular:  Negative for chest pain.  Psychiatric/Behavioral:  Negative for substance abuse.    There were no vitals taken for this visit.There is no height or weight on file to calculate BMI.  General Appearance: Casual  Eye Contact:  Fair  Speech:  Normal Rate  Volume:  Decreased  Mood:  subdued  Affect:  congruent  Thought Process:  Goal Directed  Orientation:  Full  (Time, Place, and Person)  Thought Content:  Rumination  Suicidal Thoughts:  No  Homicidal Thoughts:  No  Memory:  Immediate;   Fair Recent;   Fair  Judgement:  Fair  Insight:  Shallow  Psychomotor Activity:  Decreased  Concentration:  Concentration: Fair and Attention Span: Fair  Recall:  [employer] of Knowledge:Fair  Language: Fair  Akathisia:  Negative  Handed:  Right  AIMS (if indicated):    Assets:  Social Support  ADL's:  Intact  Cognition: WNL  Sleep:  fair    Treatment Plan Summary: Medication management and Plan as follows   1. Major depression recurrent : Subdued increase  Wellbutrin to 200 mg  2. SOcial anxiety:; Fluctuates continue Lexapro and recommend therapy to work on distraction and understanding how to deal with interaction and stressors contributing to her depression and anxiety  Lexapro is already 40 mg considering her subdued mood we will increase the Wellbutrin to 200 for now Follow-up in 1 months to 2 months Merian Capron, MD 6/27/20221:42 PM

## 2021-03-15 ENCOUNTER — Telehealth (HOSPITAL_COMMUNITY): Payer: No Typology Code available for payment source | Admitting: Psychiatry

## 2021-03-19 ENCOUNTER — Telehealth (HOSPITAL_COMMUNITY): Payer: No Typology Code available for payment source | Admitting: Psychiatry

## 2021-03-31 ENCOUNTER — Other Ambulatory Visit (HOSPITAL_BASED_OUTPATIENT_CLINIC_OR_DEPARTMENT_OTHER): Payer: Self-pay

## 2021-03-31 ENCOUNTER — Other Ambulatory Visit (HOSPITAL_COMMUNITY): Payer: Self-pay | Admitting: Psychiatry

## 2021-03-31 MED FILL — Hydrochlorothiazide Tab 12.5 MG: ORAL | 90 days supply | Qty: 90 | Fill #1 | Status: AC

## 2021-03-31 MED FILL — Atenolol Tab 50 MG: ORAL | 90 days supply | Qty: 90 | Fill #1 | Status: AC

## 2021-04-01 ENCOUNTER — Other Ambulatory Visit (HOSPITAL_BASED_OUTPATIENT_CLINIC_OR_DEPARTMENT_OTHER): Payer: Self-pay

## 2021-04-01 MED ORDER — BUPROPION HCL ER (SR) 200 MG PO TB12
200.0000 mg | ORAL_TABLET | Freq: Every day | ORAL | 0 refills | Status: DC
  Filled 2021-04-01 – 2021-05-18 (×2): qty 30, 30d supply, fill #0

## 2021-04-09 ENCOUNTER — Other Ambulatory Visit (HOSPITAL_BASED_OUTPATIENT_CLINIC_OR_DEPARTMENT_OTHER): Payer: Self-pay

## 2021-05-18 ENCOUNTER — Other Ambulatory Visit (HOSPITAL_BASED_OUTPATIENT_CLINIC_OR_DEPARTMENT_OTHER): Payer: Self-pay

## 2021-07-20 ENCOUNTER — Other Ambulatory Visit (HOSPITAL_BASED_OUTPATIENT_CLINIC_OR_DEPARTMENT_OTHER): Payer: Self-pay

## 2021-07-20 MED ORDER — SODIUM FLUORIDE 0.2 % MT SOLN
OROMUCOSAL | 5 refills | Status: DC
Start: 2021-07-20 — End: 2024-03-17
  Filled 2021-07-20: qty 473, 30d supply, fill #0

## 2021-07-21 ENCOUNTER — Other Ambulatory Visit (HOSPITAL_BASED_OUTPATIENT_CLINIC_OR_DEPARTMENT_OTHER): Payer: Self-pay

## 2021-07-23 ENCOUNTER — Other Ambulatory Visit (HOSPITAL_BASED_OUTPATIENT_CLINIC_OR_DEPARTMENT_OTHER): Payer: Self-pay

## 2021-08-31 ENCOUNTER — Encounter (HOSPITAL_COMMUNITY): Payer: Self-pay | Admitting: Psychiatry

## 2021-08-31 ENCOUNTER — Other Ambulatory Visit (HOSPITAL_BASED_OUTPATIENT_CLINIC_OR_DEPARTMENT_OTHER): Payer: Self-pay

## 2021-08-31 ENCOUNTER — Telehealth (INDEPENDENT_AMBULATORY_CARE_PROVIDER_SITE_OTHER): Payer: 59 | Admitting: Psychiatry

## 2021-08-31 DIAGNOSIS — F4010 Social phobia, unspecified: Secondary | ICD-10-CM

## 2021-08-31 DIAGNOSIS — F332 Major depressive disorder, recurrent severe without psychotic features: Secondary | ICD-10-CM | POA: Diagnosis not present

## 2021-08-31 MED ORDER — BUPROPION HCL ER (SR) 150 MG PO TB12
150.0000 mg | ORAL_TABLET | Freq: Two times a day (BID) | ORAL | 1 refills | Status: DC
Start: 2021-08-31 — End: 2022-05-26
  Filled 2021-08-31: qty 60, 30d supply, fill #0
  Filled 2021-12-16: qty 60, 30d supply, fill #1

## 2021-08-31 MED ORDER — ESCITALOPRAM OXALATE 20 MG PO TABS
40.0000 mg | ORAL_TABLET | Freq: Every day | ORAL | 1 refills | Status: DC
  Filled 2021-08-31: qty 60, 30d supply, fill #0
  Filled 2021-12-16: qty 60, 30d supply, fill #1

## 2021-08-31 NOTE — Progress Notes (Signed)
[facility] Outpatient Follow up visit  Patient Identification: Alexa [name] MRN:  [id number] Date of Evaluation:  08/31/2021 Referral Source: Counsellor Chief Complaint:   depression follow up . Visit Diagnosis:    ICD-10-CM   1. Severe episode of recurrent major depressive disorder, without psychotic features ([place])  F33.2     2. Social anxiety disorder  F40.10     Virtual Visit via Telephone Note  I connected with Alexa [name] on 08/31/21 at  1:00 PM EST by telephone and verified that I am speaking with the correct person using two identifiers.  Location: Patient: parked care Provider: office   I discussed the limitations, risks, security and privacy concerns of performing an evaluation and management service by telephone and the availability of in person appointments. I also discussed with the patient that there may be a patient responsible charge related to this service. The patient expressed understanding and agreed to proceed.     I discussed the assessment and treatment plan with the patient. The patient was provided an opportunity to ask questions and all were answered. The patient agreed with the plan and demonstrated an understanding of the instructions.   The patient was advised to call back or seek an in-person evaluation if the symptoms worsen or if the condition fails to improve as anticipated.  I provided 15 minutes of non-face-to-face time during this encounter.   Merian Capron, MD      I discussed the assessment and treatment plan with the patient. The patient was provided an opportunity to ask questions and all were answered. The patient agreed with the plan and demonstrated an understanding of the instructions.   The patient was advised to call back or seek an in-person evaluation if the symptoms worsen or if the condition fails to improve as anticipated.  I provided 15  minutes of non-face-to-face time during this encounter.   History of Present Illness:   51  years old currently single African-American female initially referred by our counselor for management of depression and anxiety. 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 Brain tumor.mom and aunt sick Modifying factors : family Severity of depression: Subdued   Past Psychiatric History: SSRI treatment for depression and anxiety  Previous Psychotropic Medications: Yes   Substance Abuse History in the last 12 months:  No.  Consequences of Substance Abuse: NA  Past Medical History:  Past Medical History:  Diagnosis Date   Anemia    Anxiety    Depression    GERD (gastroesophageal reflux disease)    Headache    Hypertension    Meningioma ([place])     Past Surgical History:  Procedure Laterality Date   CRANIOTOMY  05/26/2016   Procedure: SUBOCCIPITAL CRANIOTOMY WITH PLACEMENT OF VENTRICULAR CATHETER;  Surgeon: Ashok Pall, MD;  Location: [facility] OR;  Service: Neurosurgery;;  CRANIOTOMY - SUBOCCIPITAL   WISDOM TOOTH EXTRACTION      Family Psychiatric History: father side : depression most family members  Family History:  Family History  Problem Relation Age of Onset   Hypertension Mother    Heart disease Mother    Hypertension Father    Kidney disease Father    Ulcers Father    Diabetes Father        many paternal family members also  with diabetes   Ovarian cancer Maternal Grandmother    Cirrhosis Paternal Grandfather        alcohol related   Thyroid cancer Maternal Aunt    Colon cancer Neg Hx    Esophageal cancer Neg Hx    Pancreatic cancer Neg Hx    Stomach cancer Neg Hx     Social History:   Social History   Socioeconomic History   Marital status: Single    Spouse name: Not on file   Number of children: Not on file   Years of education:  Not on file   Highest education level: Not on file  Occupational History   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     Employer: [employer]  Tobacco Use   Smoking status: Never   Smokeless tobacco: Never  Vaping Use   Vaping Use: Never used  Substance and Sexual Activity   Alcohol use: Yes    Comment: socially 1/2 glass   Drug use: No   Sexual activity: Yes  Other Topics Concern   Not on file  Social History Narrative   Not on file   Social Determinants of Health   Financial Resource Strain: Not on file  Food Insecurity: Not on file  Transportation Needs: Not on file  Physical Activity: Not on file  Stress: Not on file  Social Connections: Not on file      Allergies:  No Known Allergies  Metabolic Disorder Labs: No results found for: HGBA1C, MPG No results found for: PROLACTIN No results found for: CHOL, TRIG, HDL, CHOLHDL, VLDL, LDLCALC   Current Medications: Current Outpatient Medications  Medication Sig Dispense Refill   atenolol (TENORMIN) 50 MG tablet Take 50 mg by mouth daily.     atenolol (TENORMIN) 50 MG tablet TAKE 1 TABLET BY MOUTH DAILY 90 tablet 3   buPROPion (WELLBUTRIN SR) 150 MG 12 hr tablet Take 1 tablet (150 mg total) by mouth 2 (two) times daily. 60 tablet 1   Cholecalciferol (VITAMIN D3) 50000 units TABS Take by mouth. Once per week     escitalopram (LEXAPRO) 20 MG tablet Take 2 tablets (40 mg total) by mouth daily. 60 tablet 1   ferrous sulfate 325 (65 FE) MG tablet Take 325 mg by mouth daily with breakfast.     hydrochlorothiazide (HYDRODIURIL) 12.5 MG tablet Take 12.5 mg by mouth daily.     hydrochlorothiazide (HYDRODIURIL) 12.5 MG tablet TAKE 1 TABLET BY MOUTH ONCE DAILY 90 tablet 3   ibuprofen (ADVIL,MOTRIN) 200 MG tablet Take 200-400 mg by mouth 2 (two) times daily as needed for headache or moderate pain.     omeprazole (PRILOSEC OTC) 20 MG tablet Take 20 mg by mouth daily.     SODIUM FLUORIDE, DENTAL RINSE, (PREVIDENT) 0.2 % SOLN  Use as an oral rinse as directed on package 473 mL 5   No current facility-administered medications for this visit.      Psychiatric Specialty Exam: Review of Systems  Cardiovascular:  Negative for chest pain.  Neurological:  Negative for tremors.  Psychiatric/Behavioral:  Positive for depression. Negative for substance abuse.    There were no vitals taken for this visit.There is no height or weight on file to calculate BMI.  General Appearance:   Eye Contact:   Speech:  Normal Rate  Volume:  Decreased  Mood:  subdued  Affect:    Thought Process:  Goal Directed  Orientation:  Full (Time, Place, and Person)  Thought Content:  Rumination  Suicidal  Thoughts:  No  Homicidal Thoughts:  No  Memory:  Immediate;   Fair Recent;   Fair  Judgement:  Fair  Insight:  Shallow  Psychomotor Activity:  Decreased  Concentration:  Concentration: Fair and Attention Span: Fair  Recall:  [employer] of Knowledge:Fair  Language: Fair  Akathisia:  Negative  Handed:  Right  AIMS (if indicated):    Assets:  Social Support  ADL's:  Intact  Cognition: WNL  Sleep:  fair    Treatment Plan Summary: Medication management and Plan as follows    Prior documenation reviewed  1. Major depression recurrent : subdued increase wellbutrin to 150mg  bid continue lexapro  2. SOcial anxiety:; fluctuates, continue lexapro , consider therapy, she will call to schedule Fu 49m or early discussed compliance and distraxtion from negative thoughts Merian Capron, MD 1/31/20231:18 PM

## 2021-09-17 ENCOUNTER — Ambulatory Visit (INDEPENDENT_AMBULATORY_CARE_PROVIDER_SITE_OTHER): Payer: 59 | Admitting: Licensed Clinical Social Worker

## 2021-09-17 ENCOUNTER — Telehealth (HOSPITAL_COMMUNITY): Payer: Self-pay | Admitting: Licensed Clinical Social Worker

## 2021-09-17 DIAGNOSIS — F4010 Social phobia, unspecified: Secondary | ICD-10-CM

## 2021-09-17 DIAGNOSIS — F332 Major depressive disorder, recurrent severe without psychotic features: Secondary | ICD-10-CM

## 2021-09-19 NOTE — Progress Notes (Signed)
Virtual Visit via Telephone Note  I connected with Alexa [name] on 09/19/21 at 11:00 AM EST by telephone and verified that I am speaking with the correct person using two identifiers.  Location: Patient: stationary car Provider: home office   I discussed the limitations, risks, security and privacy concerns of performing an evaluation and management service by telephone and the availability of in person appointments. I also discussed with the patient that there may be a patient responsible charge related to this service. The patient expressed understanding and agreed to proceed.   History of Present Illness:    Observations/Objective:   Assessment and Plan:   Follow Up Instructions:    I discussed the assessment and treatment plan with the patient. The patient was provided an opportunity to ask questions and all were answered. The patient agreed with the plan and demonstrated an understanding of the instructions.   The patient was advised to call back or seek an in-person evaluation if the symptoms worsen or if the condition fails to improve as anticipated.  I provided 60 minutes of non-face-to-face time during this encounter.  Comprehensive Clinical Assessment (CCA) Note  09/19/2021 Alexa [name] [id number]  Chief Complaint:  Chief Complaint  Patient presents with   Depression   Anxiety   Visit Diagnosis: Major depressive disorder recurrent severe social anxiety disorder   CCA Biopsychosocial Intake/Chief Complaint:  doctor suggested that patient speak therapist because medication alone is not working. Having negative thoughts, depression highs and lows of that. Agreed to speak with somebody  Current Symptoms/Problems: anxiety, negative thoughts, dealing with depression and the highs and lows that   Patient Reported Schizophrenia/Schizoaffective Diagnosis in Past: No   Strengths: strengths-can't identify not too much. Good listener but when depressed  doesn't really respect herself because can't hold it together like thinks she should. Supports-two sisters, mom, one good friend in [place] help as much as can doesn't understand really  Preferences: work on social anxiety and depression  Abilities: abilities-likes to draw before symptoms bad used to travel, loved to travel, did cruises or fly some place now go to work, caretaker, Darrick Meigs meetings come home missed those.   Type of Services Patient Feels are Needed: Medication management and therapy   Initial Clinical Notes/Concerns: depression-cont-supervisors helpful doesn't have as much interaction with patient does pre-cert for medical procedures father passed 2020 when worse with patient's felt like was going to break down and not be able to keep it together, tired also a caretaker has an Alzheimer patient at home give her mom a break then finishes work and then go to bed and start again adds the fatigue. stress eats. Sleeping-get up 4 times thanking about anxieties doesn't sleep straight through. Weight steady fast during th day control doesn't even taste food do fill a void then will fast and have an eating window. could stand to lose quite a bit of weight. Has had depression since teenager. treatment-mom was concerned when a teenager used to see therapist put her on Paxil worked for awhile moved to [place] for 20 years where sister was thought maybe a new start. There had medication changing. On medication 20 years highs and lows from that try to pretend normal most of life and came crashing down. Parents getting older why moved back. Starting getting bad headaches 2017 brain tumor benign and removed then they took care of her as she started to get better when father passed.  [place] feels in her back with the  weight has to keep it down or will have to have surgery   Mental Health Symptoms Depression:   Change in energy/activity; Fatigue; Increase/decrease in appetite; Sleep (too much or  little); Tearfulness; Hopelessness; Worthlessness (wants to do something afraid panic attacks take over, gets her depression it is a loop. At home a lot.  Difference between what patient wants to do and what she does this is not what she envisioned at this point of life.)   Duration of Depressive symptoms:  Greater than two weeks   Mania:   None   Anxiety:    Fatigue; Sleep; Worrying; Restlessness; Tension (nervous around-endorses social anxiety. Jehovah Witness missed the first convention in her life in January another one coming up don't know how to do that. Zoom has helped a lot. it is struggle. Wants to be around people but brain goes haywired.)   Psychosis:   None   Duration of Psychotic symptoms: No data recorded  Trauma:   None   Obsessions:   None   Compulsions:   None   Inattention:   N/A   Hyperactivity/Impulsivity:   N/A   Oppositional/Defiant Behaviors:   N/A   Emotional Irregularity:   N/A   Other Mood/Personality Symptoms:   anxiety-heart palpitations. Social anxiety goes into social phobia sometimes some days ok starting to know when can go out. Have to go to work but going anywhere. Gives example friend invite and then have to leave embarrassing people do not get it very lonely. family history-anxiety on father's side, brother-anxiety. Family is in denial don't talk about it much. maternal grandfather-alcoholic. Medical-low vitanin D, high blood pressure. 5% of brain tumor did not take it out because heart rate became dangerously low and had to stop so when she gets a headache she begins to worry that its its coming back is getting bigger, brain tumor grown and comes back sometimes says good would be an answer to issues. 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 supports-lives with mom, aunt and younger sister    Mental Status Exam Appearance and self-care  Stature:   Tall   Weight:   Overweight (217 llbs)    Clothing:  No data recorded  Grooming:  No data recorded  Cosmetic use:  No data recorded  Posture/gait:  No data recorded  Motor activity:  No data recorded  Sensorium  Attention:   Normal   Concentration:   Normal   Orientation:   X5   Recall/memory:   Normal   Affect and Mood  Affect:   Appropriate   Mood:   Anxious; Depressed   Relating  Eye contact:  No data recorded  Facial expression:  No data recorded  Attitude toward examiner:   Cooperative   Thought and Language  Speech flow:  Normal   Thought content:   Appropriate to Mood and Circumstances   Preoccupation:   None   Hallucinations:   None   Organization:  No data recorded  [employer] of Knowledge:   Average   Intelligence:   Average   Abstraction:   Normal   Judgement:   Fair   Art therapist:   Realistic   Insight:   Fair   Decision Making:   Vacilates   Social Functioning  Social Maturity:   Isolates   Social Judgement:   Normal   Stress  Stressors:   Illness (offending people because they don't understand. When bad zone out  walk past without speaking thinks come off rude all of life not gelling with people.  overthink a lot)   Coping Ability:   Overwhelmed; Exhausted (different degrees some days tearful all day then another day ok up and down)   Skill Deficits:   Self-care (not sure with interpsonal relationships comfortable in bubble so think burned so many times so good where she is at. Not sure how travel going to work change hours with company so can be more of caregiver at home working from work and at home-)   Supports:   Social worker; Recruitment consultant system (doesn't like to burden her family too much not go too deep praying a lot when open up people don't understand backfires use against her don't do as  much because people don't understand.)     Religion: Religion/Spirituality Are You A Religious Person?: Yes What is Your Religious  Affiliation?: Jehovah's Witness How Might This Affect Treatment?: n/a  Leisure/Recreation: Leisure / Recreation Do You Have Hobbies?: Yes (see above)  Exercise/Diet: Exercise/Diet Do You Exercise?: No (Needs to wear the suggestions from very helpful article she read about anxiety, depression "Mental heath help from the bible") Have You Gained or Lost A Significant Amount of Weight in the Past Six Months?: No Do You Follow a Special Diet?: No Do You Have Any Trouble Sleeping?: Yes Explanation of Sleeping Difficulties: Usually up at least 4 times a night   CCA Employment/Education Employment/Work Situation: Employment / Work Situation Employment Situation: Employed 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 What is the Longest Time Patient has Held a Job?: 10 years Where was the Patient Employed at that Time?: Worked for a pulmonologist in [place] Has Patient ever Been in the [employer]?: No  Education: Education Is Patient Currently Attending School?: No Last Grade Completed: 14 Name of High School: [place] Did Teacher, adult education From [employer]?: Yes Did You Attend College?: Yes What Type of College Degree Do you Have?: LPN Did [place]?: No Did You Have An Individualized Education Program (IIEP): No Did You Have Any Difficulty At School?: Yes (low self-esteem anxiety made it hard for her to concentrate school was hard. All her life trying to be normal. Going to go to cafeteria with  all those people. Nobody knew about panic. Go to parties and wallflower growing up miserable.) Were Any Medications Ever Prescribed For These Difficulties?: Yes Medications Prescribed For School Difficulties?: Paxil-doubt it would been difficult had been suicidal   CCA Family/Childhood History Family and Relationship History:    Childhood History:     Child/Adolescent Assessment:     CCA Substance Use Alcohol/Drug Use:                           ASAM's:  Six Dimensions of Multidimensional Assessment  Dimension 1:  Acute Intoxication and/or Withdrawal Potential:      Dimension 2:  Biomedical Conditions and Complications:      Dimension 3:  Emotional, Behavioral, or Cognitive Conditions and Complications:  Dimension 4:  Readiness to Change:     Dimension 5:  Relapse, Continued use, or Continued Problem Potential:     Dimension 6:  Recovery/Living Environment:     ASAM Severity Score:    ASAM Recommended Level of Treatment:     Substance use Disorder (SUD)    Recommendations for Services/Supports/Treatments: Therapy, med management    DSM5 Diagnoses: Patient Active Problem List   Diagnosis Date Noted   Social anxiety disorder 12/25/2016   Severe episode of recurrent major depressive disorder, without psychotic features ([place]) 12/25/2016   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 De Nurse  Patient/Guardian was  advised Release of Information must be obtained prior to any record release in order to collaborate their care with an outside provider. Patient/Guardian was advised if they have not already done so to contact the registration department to sign all necessary forms in order for [place] to release information regarding their care.   Consent: Patient/Guardian gives verbal consent for treatment and assignment of benefits for services provided during this visit. Patient/Guardian expressed understanding and agreed to proceed.   Cordella Register, LCSW

## 2021-09-22 ENCOUNTER — Other Ambulatory Visit (HOSPITAL_BASED_OUTPATIENT_CLINIC_OR_DEPARTMENT_OTHER): Payer: Self-pay

## 2021-09-23 NOTE — Telephone Encounter (Signed)
NA

## 2021-09-23 NOTE — Telephone Encounter (Signed)
Na

## 2021-11-03 ENCOUNTER — Other Ambulatory Visit (HOSPITAL_BASED_OUTPATIENT_CLINIC_OR_DEPARTMENT_OTHER): Payer: Self-pay

## 2021-11-03 MED ORDER — VITAMIN D 50 MCG (2000 UT) PO CAPS
4000.0000 [IU] | ORAL_CAPSULE | Freq: Every day | ORAL | 0 refills | Status: DC
  Filled 2021-11-03: qty 200, 100d supply, fill #0

## 2021-12-16 ENCOUNTER — Other Ambulatory Visit (HOSPITAL_BASED_OUTPATIENT_CLINIC_OR_DEPARTMENT_OTHER): Payer: Self-pay

## 2021-12-20 ENCOUNTER — Other Ambulatory Visit (HOSPITAL_BASED_OUTPATIENT_CLINIC_OR_DEPARTMENT_OTHER): Payer: Self-pay

## 2021-12-20 MED ORDER — HYDROCHLOROTHIAZIDE 12.5 MG PO TABS
ORAL_TABLET | ORAL | 0 refills | Status: DC
  Filled 2021-12-20: qty 30, 30d supply, fill #0

## 2021-12-20 MED ORDER — ATENOLOL 50 MG PO TABS
ORAL_TABLET | ORAL | 0 refills | Status: DC
  Filled 2021-12-20: qty 30, 30d supply, fill #0

## 2022-01-31 DIAGNOSIS — I10 Essential (primary) hypertension: Secondary | ICD-10-CM | POA: Diagnosis not present

## 2022-01-31 DIAGNOSIS — E559 Vitamin D deficiency, unspecified: Secondary | ICD-10-CM | POA: Diagnosis not present

## 2022-02-04 ENCOUNTER — Other Ambulatory Visit (HOSPITAL_BASED_OUTPATIENT_CLINIC_OR_DEPARTMENT_OTHER): Payer: Self-pay

## 2022-02-04 DIAGNOSIS — Z1339 Encounter for screening examination for other mental health and behavioral disorders: Secondary | ICD-10-CM | POA: Diagnosis not present

## 2022-02-04 DIAGNOSIS — E669 Obesity, unspecified: Secondary | ICD-10-CM | POA: Diagnosis not present

## 2022-02-04 DIAGNOSIS — F321 Major depressive disorder, single episode, moderate: Secondary | ICD-10-CM | POA: Diagnosis not present

## 2022-02-04 DIAGNOSIS — Z1331 Encounter for screening for depression: Secondary | ICD-10-CM | POA: Diagnosis not present

## 2022-02-04 DIAGNOSIS — E559 Vitamin D deficiency, unspecified: Secondary | ICD-10-CM | POA: Diagnosis not present

## 2022-02-04 DIAGNOSIS — R82998 Other abnormal findings in urine: Secondary | ICD-10-CM | POA: Diagnosis not present

## 2022-02-04 DIAGNOSIS — Z0000 Encounter for general adult medical examination without abnormal findings: Secondary | ICD-10-CM | POA: Diagnosis not present

## 2022-02-04 DIAGNOSIS — I10 Essential (primary) hypertension: Secondary | ICD-10-CM | POA: Diagnosis not present

## 2022-02-04 MED ORDER — WEGOVY 0.25 MG/0.5ML ~~LOC~~ SOAJ
SUBCUTANEOUS | 0 refills | Status: DC
  Filled 2022-02-04: qty 2, 28d supply, fill #0

## 2022-02-07 ENCOUNTER — Other Ambulatory Visit (HOSPITAL_BASED_OUTPATIENT_CLINIC_OR_DEPARTMENT_OTHER): Payer: Self-pay

## 2022-02-07 MED ORDER — WEGOVY 0.25 MG/0.5ML ~~LOC~~ SOAJ: SUBCUTANEOUS | 0 refills | Status: DC

## 2022-02-09 ENCOUNTER — Other Ambulatory Visit (HOSPITAL_BASED_OUTPATIENT_CLINIC_OR_DEPARTMENT_OTHER): Payer: Self-pay

## 2022-02-09 MED ORDER — CEPHALEXIN 250 MG PO CAPS
250.0000 mg | ORAL_CAPSULE | Freq: Three times a day (TID) | ORAL | 0 refills | Status: DC
  Filled 2022-02-09: qty 15, 5d supply, fill #0

## 2022-02-18 ENCOUNTER — Other Ambulatory Visit (HOSPITAL_BASED_OUTPATIENT_CLINIC_OR_DEPARTMENT_OTHER): Payer: Self-pay

## 2022-02-28 ENCOUNTER — Other Ambulatory Visit (HOSPITAL_COMMUNITY): Payer: Self-pay

## 2022-02-28 MED ORDER — WEGOVY 0.5 MG/0.5ML ~~LOC~~ SOAJ
0.5000 mg | SUBCUTANEOUS | 0 refills | Status: DC
  Filled 2022-02-28 – 2022-03-09 (×2): qty 2, 28d supply, fill #0

## 2022-03-07 ENCOUNTER — Other Ambulatory Visit (HOSPITAL_BASED_OUTPATIENT_CLINIC_OR_DEPARTMENT_OTHER): Payer: Self-pay

## 2022-03-07 ENCOUNTER — Other Ambulatory Visit (HOSPITAL_COMMUNITY): Payer: Self-pay

## 2022-03-09 ENCOUNTER — Other Ambulatory Visit (HOSPITAL_BASED_OUTPATIENT_CLINIC_OR_DEPARTMENT_OTHER): Payer: Self-pay

## 2022-03-09 ENCOUNTER — Other Ambulatory Visit (HOSPITAL_COMMUNITY): Payer: Self-pay

## 2022-03-09 MED ORDER — HYDROCHLOROTHIAZIDE 12.5 MG PO TABS
ORAL_TABLET | ORAL | 3 refills | Status: DC
  Filled 2022-03-09: qty 90, 90d supply, fill #0
  Filled 2022-09-30: qty 90, 90d supply, fill #1

## 2022-03-09 MED ORDER — ATENOLOL 50 MG PO TABS
ORAL_TABLET | ORAL | 3 refills | Status: DC
  Filled 2022-03-09: qty 90, 90d supply, fill #0
  Filled 2022-09-30: qty 90, 90d supply, fill #1

## 2022-03-24 ENCOUNTER — Other Ambulatory Visit (HOSPITAL_BASED_OUTPATIENT_CLINIC_OR_DEPARTMENT_OTHER): Payer: Self-pay

## 2022-03-24 MED ORDER — WEGOVY 1 MG/0.5ML ~~LOC~~ SOAJ
1.0000 mg | SUBCUTANEOUS | 0 refills | Status: DC
  Filled 2022-03-24: qty 2, 28d supply, fill #0

## 2022-04-21 ENCOUNTER — Other Ambulatory Visit (HOSPITAL_BASED_OUTPATIENT_CLINIC_OR_DEPARTMENT_OTHER): Payer: Self-pay

## 2022-04-21 MED ORDER — PREVIDENT 0.2 % MT SOLN
OROMUCOSAL | 5 refills | Status: DC
  Filled 2022-04-21: qty 473, 30d supply, fill #0

## 2022-04-22 ENCOUNTER — Other Ambulatory Visit (HOSPITAL_BASED_OUTPATIENT_CLINIC_OR_DEPARTMENT_OTHER): Payer: Self-pay

## 2022-04-22 DIAGNOSIS — E669 Obesity, unspecified: Secondary | ICD-10-CM | POA: Diagnosis not present

## 2022-04-22 DIAGNOSIS — I10 Essential (primary) hypertension: Secondary | ICD-10-CM | POA: Diagnosis not present

## 2022-04-22 MED ORDER — WEGOVY 1.7 MG/0.75ML ~~LOC~~ SOAJ
SUBCUTANEOUS | 0 refills | Status: DC
  Filled 2022-04-22 (×2): qty 3, 28d supply, fill #0

## 2022-04-22 MED ORDER — WEGOVY 2.4 MG/0.75ML ~~LOC~~ SOAJ
SUBCUTANEOUS | 0 refills | Status: DC
  Filled 2022-04-22 – 2022-06-20 (×2): qty 3, 28d supply, fill #0

## 2022-05-19 ENCOUNTER — Other Ambulatory Visit (HOSPITAL_BASED_OUTPATIENT_CLINIC_OR_DEPARTMENT_OTHER): Payer: Self-pay

## 2022-05-19 ENCOUNTER — Other Ambulatory Visit (HOSPITAL_COMMUNITY): Payer: Self-pay | Admitting: Psychiatry

## 2022-05-26 ENCOUNTER — Other Ambulatory Visit (HOSPITAL_COMMUNITY): Payer: Self-pay

## 2022-05-26 ENCOUNTER — Other Ambulatory Visit (HOSPITAL_BASED_OUTPATIENT_CLINIC_OR_DEPARTMENT_OTHER): Payer: Self-pay

## 2022-05-26 MED ORDER — ESCITALOPRAM OXALATE 20 MG PO TABS
40.0000 mg | ORAL_TABLET | Freq: Every day | ORAL | 1 refills | Status: DC
  Filled 2022-05-26: qty 60, 30d supply, fill #0

## 2022-05-26 MED ORDER — BUPROPION HCL ER (SR) 150 MG PO TB12
150.0000 mg | ORAL_TABLET | Freq: Two times a day (BID) | ORAL | 1 refills | Status: DC
  Filled 2022-05-26: qty 60, 30d supply, fill #0

## 2022-05-27 ENCOUNTER — Other Ambulatory Visit (HOSPITAL_BASED_OUTPATIENT_CLINIC_OR_DEPARTMENT_OTHER): Payer: Self-pay

## 2022-05-27 MED ORDER — CHLORHEXIDINE GLUCONATE 0.12 % MT SOLN
OROMUCOSAL | 0 refills | Status: DC
  Filled 2022-05-27: qty 473, 30d supply, fill #0

## 2022-06-03 ENCOUNTER — Encounter (HOSPITAL_COMMUNITY): Payer: Self-pay | Admitting: Psychiatry

## 2022-06-03 ENCOUNTER — Telehealth (INDEPENDENT_AMBULATORY_CARE_PROVIDER_SITE_OTHER): Payer: 59 | Admitting: Psychiatry

## 2022-06-03 ENCOUNTER — Other Ambulatory Visit (HOSPITAL_BASED_OUTPATIENT_CLINIC_OR_DEPARTMENT_OTHER): Payer: Self-pay

## 2022-06-03 DIAGNOSIS — F332 Major depressive disorder, recurrent severe without psychotic features: Secondary | ICD-10-CM | POA: Diagnosis not present

## 2022-06-03 DIAGNOSIS — F4010 Social phobia, unspecified: Secondary | ICD-10-CM

## 2022-06-03 MED ORDER — ESCITALOPRAM OXALATE 20 MG PO TABS
40.0000 mg | ORAL_TABLET | Freq: Every day | ORAL | 1 refills | Status: DC
  Filled 2022-06-03 – 2022-11-06 (×2): qty 60, 30d supply, fill #0

## 2022-06-03 MED ORDER — BUPROPION HCL ER (SR) 150 MG PO TB12
150.0000 mg | ORAL_TABLET | Freq: Two times a day (BID) | ORAL | 1 refills | Status: DC
  Filled 2022-06-03 – 2022-11-20 (×2): qty 60, 30d supply, fill #0

## 2022-06-03 NOTE — Progress Notes (Signed)
[facility] Outpatient Follow up visit  Patient Identification: Alexa [name] MRN:  [id number] Date of Evaluation:  06/03/2022 Referral Source: Counsellor Chief Complaint:   depression follow up . Visit Diagnosis:    ICD-10-CM   1. Severe episode of recurrent major depressive disorder, without psychotic features ([place])  F33.2     2. Social anxiety disorder  F40.10         Virtual Visit via Video Note  I connected with Alexa [name] on 06/03/22 at  1:00 PM EDT by a video enabled telemedicine application and verified that I am speaking with the correct person using two identifiers.  Location: Patient: parked car Provider: office   I discussed the limitations of evaluation and management by telemedicine and the availability of in person appointments. The patient expressed understanding and agreed to proceed.    I discussed the assessment and treatment plan with the patient. The patient was provided an opportunity to ask questions and all were answered. The patient agreed with the plan and demonstrated an understanding of the instructions.   The patient was advised to call back or seek an in-person evaluation if the symptoms worsen or if the condition fails to improve as anticipated.  I provided 15 minutes of non-face-to-face time during this encounter.    History of Present Illness:  51  years old currently single African-American female initially referred by our counselor for management of depression and anxiety. 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 Brain tumor.mom and aunt sick Modifying factors : family Severity of depression: some better   Past Psychiatric History: SSRI treatment for depression and anxiety  Previous Psychotropic Medications: Yes   Substance Abuse History in the  last 12 months:  No.  Consequences of Substance Abuse: NA  Past Medical History:  Past Medical History:  Diagnosis Date   Anemia    Anxiety    Depression    GERD (gastroesophageal reflux disease)    Headache    Hypertension    Meningioma ([place])     Past Surgical History:  Procedure Laterality Date   CRANIOTOMY  05/26/2016   Procedure: SUBOCCIPITAL CRANIOTOMY WITH PLACEMENT OF VENTRICULAR CATHETER;  Surgeon: Ashok Pall, MD;  Location: [facility] OR;  Service: Neurosurgery;;  CRANIOTOMY - SUBOCCIPITAL   WISDOM TOOTH EXTRACTION      Family Psychiatric History: father side : depression most family members  Family History:  Family History  Problem Relation Age of Onset   Hypertension Mother    Heart disease Mother    Hypertension Father    Kidney disease Father    Ulcers Father    Diabetes Father        many paternal family members also with diabetes   Ovarian cancer Maternal Grandmother    Cirrhosis Paternal Grandfather        alcohol related   Thyroid cancer Maternal Aunt    Colon cancer Neg Hx    Esophageal cancer Neg Hx    Pancreatic cancer Neg Hx    Stomach cancer Neg Hx     Social History:   Social History   Socioeconomic History   Marital status: Single    Spouse name: Not on file   Number of children: Not on file   Years of education: Not on file   Highest education level: Not on file  Occupational History   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     Employer: [employer]  Tobacco Use   Smoking status: Never   Smokeless tobacco: Never  Vaping Use   Vaping Use: Never used  Substance and Sexual Activity   Alcohol use: Yes    Comment: socially 1/2 glass   Drug use: No   Sexual activity: Yes  Other Topics Concern   Not on file  Social History Narrative   Not on file   Social Determinants of Health   Financial Resource Strain: Not on file  Food Insecurity: Not on file  Transportation Needs: Not on file  Physical Activity: Not on file  Stress:  Not on file  Social Connections: Not on file      Allergies:  No Known Allergies  Metabolic Disorder Labs: No results found for: "HGBA1C", "MPG" No results found for: "PROLACTIN" No results found for: "CHOL", "TRIG", "HDL", "CHOLHDL", "VLDL", "LDLCALC"   Current Medications: Current Outpatient Medications  Medication Sig Dispense Refill   atenolol (TENORMIN) 50 MG tablet Take 50 mg by mouth daily.     atenolol (TENORMIN) 50 MG tablet TAKE 1 TABLET BY MOUTH DAILY 90 tablet 3   atenolol (TENORMIN) 50 MG tablet Take 1 tablet by mouth once a day 90 days 90 tablet 3   buPROPion (WELLBUTRIN SR) 150 MG 12 hr tablet Take 1 tablet (150 mg total) by mouth 2 (two) times daily. 60 tablet 1   cephALEXin (KEFLEX) 250 MG capsule Take 1 capsule (250 mg total) by mouth 3 (three) times daily for 5 days. 15 capsule 0   chlorhexidine (PERIOGARD) 0.12 % solution RINSE MOUTH WITH 15ML (1 CAPFUL) FOR 30 SECONDS IN THE MORNING AND EVENING AFTER TOOTHBRUSHING. EXPECTORATE AFTER RINSING, DO NOT SWALLOW. 473 mL 0   Cholecalciferol (VITAMIN D) 50 MCG (2000 UT) CAPS Take 2 capsules (4,000 Units total) by mouth daily. 200 capsule 0   Cholecalciferol (VITAMIN D3) 50000 units TABS Take by mouth. Once per week     escitalopram (LEXAPRO) 20 MG tablet Take 2 tablets (40 mg total) by mouth daily. 60 tablet 1   ferrous sulfate 325 (65 FE) MG tablet Take 325 mg by mouth daily with breakfast.     hydrochlorothiazide (HYDRODIURIL) 12.5 MG tablet Take 12.5 mg by mouth daily.     hydrochlorothiazide (HYDRODIURIL) 12.5 MG tablet TAKE 1 TABLET BY MOUTH ONCE DAILY 90 tablet 3   hydrochlorothiazide (HYDRODIURIL) 12.5 MG tablet Take 1 tablet by mouth once a day 90 days 90 tablet 3   ibuprofen (ADVIL,MOTRIN) 200 MG tablet Take 200-400 mg by mouth 2 (two) times daily as needed for headache or moderate pain.     omeprazole (PRILOSEC OTC) 20 MG tablet Take 20 mg by mouth daily.     Semaglutide-Weight Management (WEGOVY) 0.5 MG/0.5ML  SOAJ Inject 0.5 mg into the skin once a week. 2 mL 0   Semaglutide-Weight Management (WEGOVY) 1 MG/0.5ML SOAJ Inject '1mg'$  under the skin once a week 4 mL 0   Semaglutide-Weight Management (WEGOVY) 1.7 MG/0.75ML SOAJ inject 1.'7mg'$  Subcutaneous weekly x 4wk, then increase to 2.'4mg'$  30 day(s) 3 mL 0   Semaglutide-Weight Management (WEGOVY) 2.4 MG/0.75ML SOAJ inject 2.'4mg'$  Subcutaneous weekly 30 day(s) 3 mL 0   SODIUM FLUORIDE, DENTAL RINSE, (PREVIDENT) 0.2 % SOLN Use as an oral rinse as directed on package 473 mL 5   SODIUM FLUORIDE, DENTAL RINSE, (PREVIDENT) 0.2 % SOLN USE AS AN ORAL RINSE, AS DIRECTED ON PACKAGE 473 mL  5   No current facility-administered medications for this visit.      Psychiatric Specialty Exam: Review of Systems  Cardiovascular:  Negative for chest pain.  Neurological:  Negative for tremors.  Psychiatric/Behavioral:  Negative for substance abuse.     There were no vitals taken for this visit.There is no height or weight on file to calculate BMI.  General Appearance: casual  Eye Contact:   Speech:  Normal Rate  Volume:  Decreased  Mood: fair  Affect:    Thought Process:  Goal Directed  Orientation:  Full (Time, Place, and Person)  Thought Content:  Rumination  Suicidal Thoughts:  No  Homicidal Thoughts:  No  Memory:  Immediate;   Fair Recent;   Fair  Judgement:  Fair  Insight:  Shallow  Psychomotor Activity:  Decreased  Concentration:  Concentration: Fair and Attention Span: Fair  Recall:  [employer] of Knowledge:Fair  Language: Fair  Akathisia:  Negative  Handed:  Right  AIMS (if indicated):    Assets:  Social Support  ADL's:  Intact  Cognition: WNL  Sleep:  fair    Treatment Plan Summary: Medication management and Plan as follows    Prior documentation reviewed  1. Major depression recurrent : improved continue wellbutrin, lexapro  2. SOcial anxiety:; fluctuates, continue lexapro , therapy is helping Fu 19m  NMerian Capron MD 11/3/20231:07  PM

## 2022-06-08 ENCOUNTER — Other Ambulatory Visit (HOSPITAL_BASED_OUTPATIENT_CLINIC_OR_DEPARTMENT_OTHER): Payer: Self-pay

## 2022-06-20 ENCOUNTER — Other Ambulatory Visit (HOSPITAL_BASED_OUTPATIENT_CLINIC_OR_DEPARTMENT_OTHER): Payer: Self-pay

## 2022-07-14 ENCOUNTER — Other Ambulatory Visit (HOSPITAL_BASED_OUTPATIENT_CLINIC_OR_DEPARTMENT_OTHER): Payer: Self-pay

## 2022-07-18 ENCOUNTER — Other Ambulatory Visit (HOSPITAL_BASED_OUTPATIENT_CLINIC_OR_DEPARTMENT_OTHER): Payer: Self-pay

## 2022-07-18 MED ORDER — WEGOVY 2.4 MG/0.75ML ~~LOC~~ SOAJ
2.4000 mg | SUBCUTANEOUS | 6 refills | Status: DC
  Filled 2022-07-18 – 2022-09-30 (×2): qty 3, 28d supply, fill #0

## 2022-07-20 ENCOUNTER — Other Ambulatory Visit (HOSPITAL_BASED_OUTPATIENT_CLINIC_OR_DEPARTMENT_OTHER): Payer: Self-pay

## 2022-07-20 MED ORDER — WEGOVY 2.4 MG/0.75ML ~~LOC~~ SOAJ
2.4000 mg | SUBCUTANEOUS | 6 refills | Status: DC
  Filled 2022-07-20 – 2022-10-19 (×2): qty 3, 28d supply, fill #0

## 2022-07-21 ENCOUNTER — Other Ambulatory Visit (HOSPITAL_BASED_OUTPATIENT_CLINIC_OR_DEPARTMENT_OTHER): Payer: Self-pay

## 2022-07-21 MED ORDER — WEGOVY 1.7 MG/0.75ML ~~LOC~~ SOAJ
SUBCUTANEOUS | 0 refills | Status: DC
  Filled 2022-07-21: qty 3, 28d supply, fill #0

## 2022-07-27 ENCOUNTER — Other Ambulatory Visit (HOSPITAL_BASED_OUTPATIENT_CLINIC_OR_DEPARTMENT_OTHER): Payer: Self-pay

## 2022-07-28 ENCOUNTER — Other Ambulatory Visit (HOSPITAL_BASED_OUTPATIENT_CLINIC_OR_DEPARTMENT_OTHER): Payer: Self-pay

## 2022-08-19 ENCOUNTER — Other Ambulatory Visit (HOSPITAL_BASED_OUTPATIENT_CLINIC_OR_DEPARTMENT_OTHER): Payer: Self-pay

## 2022-08-19 MED ORDER — PAXLOVID (300/100) 20 X 150 MG & 10 X 100MG PO TBPK
3.0000 | ORAL_TABLET | Freq: Two times a day (BID) | ORAL | 0 refills | Status: DC
  Filled 2022-08-19: qty 30, 5d supply, fill #0

## 2022-09-02 ENCOUNTER — Telehealth (INDEPENDENT_AMBULATORY_CARE_PROVIDER_SITE_OTHER): Payer: 59 | Admitting: Psychiatry

## 2022-09-02 ENCOUNTER — Other Ambulatory Visit (HOSPITAL_BASED_OUTPATIENT_CLINIC_OR_DEPARTMENT_OTHER): Payer: Self-pay

## 2022-09-02 ENCOUNTER — Encounter (HOSPITAL_COMMUNITY): Payer: Self-pay | Admitting: Psychiatry

## 2022-09-02 DIAGNOSIS — F410 Panic disorder [episodic paroxysmal anxiety] without agoraphobia: Secondary | ICD-10-CM

## 2022-09-02 DIAGNOSIS — F332 Major depressive disorder, recurrent severe without psychotic features: Secondary | ICD-10-CM

## 2022-09-02 DIAGNOSIS — F4010 Social phobia, unspecified: Secondary | ICD-10-CM | POA: Diagnosis not present

## 2022-09-02 NOTE — Progress Notes (Signed)
[facility] Outpatient Follow up visit  Patient Identification: Alexa [name] MRN:  [id number] Date of Evaluation:  09/02/2022 Referral Source: Counsellor Chief Complaint:   depression follow up . Visit Diagnosis:    ICD-10-CM   1. Social anxiety disorder  F40.10     2. Severe episode of recurrent major depressive disorder, without psychotic features ([place])  F33.2       Virtual Visit via Video Note  I connected with Alexa [name] on 09/02/22 at  1:00 PM EST by a video enabled telemedicine application and verified that I am speaking with the correct person using two identifiers.  Location: Patient: home Provider: home office   I discussed the limitations of evaluation and management by telemedicine and the availability of in person appointments. The patient expressed understanding and agreed to proceed.       I discussed the assessment and treatment plan with the patient. The patient was provided an opportunity to ask questions and all were answered. The patient agreed with the plan and demonstrated an understanding of the instructions.   The patient was advised to call back or seek an in-person evaluation if the symptoms worsen or if the condition fails to improve as anticipated.  I provided 15 plus  minutes of non-face-to-face time during this encounter.         History of Present Illness:  52  years old currently single African-American female initially referred by our counselor for management of depression and anxiety. Works with [place] [employer]  Doing fair, still aunt and mom sick she takes care of them  All recovered from covid   Aggravating factors. Brain tumor.mom and aunt sick Modifying factors : family Severity of depression: manageable Social anxiety better , still there    Past Psychiatric History: SSRI treatment for depression and anxiety  Previous Psychotropic Medications: Yes   Substance Abuse History in the last 12 months:   No.  Consequences of Substance Abuse: NA  Past Medical History:  Past Medical History:  Diagnosis Date   Anemia    Anxiety    Depression    GERD (gastroesophageal reflux disease)    Headache    Hypertension    Meningioma ([place])     Past Surgical History:  Procedure Laterality Date   CRANIOTOMY  05/26/2016   Procedure: SUBOCCIPITAL CRANIOTOMY WITH PLACEMENT OF VENTRICULAR CATHETER;  Surgeon: Ashok Pall, MD;  Location: [place];  Service: Neurosurgery;;  CRANIOTOMY - SUBOCCIPITAL   WISDOM TOOTH EXTRACTION      Family Psychiatric History: father side : depression most family members  Family History:  Family History  Problem Relation Age of Onset   Hypertension Mother    Heart disease Mother    Hypertension Father    Kidney disease Father    Ulcers Father    Diabetes Father        many paternal family members also with diabetes   Ovarian cancer Maternal Grandmother    Cirrhosis Paternal Grandfather        alcohol related   Thyroid cancer Maternal Aunt    Colon cancer Neg Hx    Esophageal cancer Neg Hx    Pancreatic cancer Neg Hx    Stomach cancer Neg Hx     Social History:   Social History   Socioeconomic History   Marital status: Single    Spouse name: Not on file   Number of children: Not on file   Years of education: Not on file   Highest  education level: Not on file  Occupational History   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     Employer:   Tobacco Use   Smoking status: Never   Smokeless tobacco: Never  Vaping Use   Vaping Use: Never used  Substance and Sexual Activity   Alcohol use: Yes    Comment: socially 1/2 glass   Drug use: No   Sexual activity: Yes  Other Topics Concern   Not on file  Social History Narrative   Not on file   Social Determinants of Health   Financial Resource Strain: Not on file  Food Insecurity: Not on file  Transportation Needs: Not on file  Physical Activity: Not on file  Stress: Not on file   Social Connections: Not on file      Allergies:  No Known Allergies  Metabolic Disorder Labs: No results found for: "HGBA1C", "MPG" No results found for: "PROLACTIN" No results found for: "CHOL", "TRIG", "HDL", "CHOLHDL", "VLDL", "LDLCALC"   Current Medications: Current Outpatient Medications  Medication Sig Dispense Refill   atenolol (TENORMIN) 50 MG tablet Take 50 mg by mouth daily.     atenolol (TENORMIN) 50 MG tablet TAKE 1 TABLET BY MOUTH DAILY 90 tablet 3   atenolol (TENORMIN) 50 MG tablet Take 1 tablet by mouth once a day 90 days 90 tablet 3   buPROPion (WELLBUTRIN SR) 150 MG 12 hr tablet Take 1 tablet (150 mg total) by mouth 2 (two) times daily. 60 tablet 1   cephALEXin (KEFLEX) 250 MG capsule Take 1 capsule (250 mg total) by mouth 3 (three) times daily for 5 days. 15 capsule 0   chlorhexidine (PERIOGARD) 0.12 % solution RINSE MOUTH WITH 15ML (1 CAPFUL) FOR 30 SECONDS IN THE MORNING AND EVENING AFTER TOOTHBRUSHING. EXPECTORATE AFTER RINSING, DO NOT SWALLOW. 473 mL 0   Cholecalciferol (VITAMIN D) 50 MCG (2000 UT) CAPS Take 2 capsules (4,000 Units total) by mouth daily. 200 capsule 0   Cholecalciferol (VITAMIN D3) 50000 units TABS Take by mouth. Once per week     escitalopram (LEXAPRO) 20 MG tablet Take 2 tablets (40 mg total) by mouth daily. 60 tablet 1   ferrous sulfate 325 (65 FE) MG tablet Take 325 mg by mouth daily with breakfast.     hydrochlorothiazide (HYDRODIURIL) 12.5 MG tablet Take 12.5 mg by mouth daily.     hydrochlorothiazide (HYDRODIURIL) 12.5 MG tablet TAKE 1 TABLET BY MOUTH ONCE DAILY 90 tablet 3   hydrochlorothiazide (HYDRODIURIL) 12.5 MG tablet Take 1 tablet by mouth once a day 90 days 90 tablet 3   ibuprofen (ADVIL,MOTRIN) 200 MG tablet Take 200-400 mg by mouth 2 (two) times daily as needed for headache or moderate pain.     nirmatrelvir & ritonavir (PAXLOVID, 300/100,) 20 x 150 MG & 10 x '100MG'$  TBPK Take 3 tablets by mouth in the morning and at bedtime. 30  tablet 0   omeprazole (PRILOSEC OTC) 20 MG tablet Take 20 mg by mouth daily.     Semaglutide-Weight Management (WEGOVY) 0.5 MG/0.5ML SOAJ Inject 0.5 mg into the skin once a week. 2 mL 0   Semaglutide-Weight Management (WEGOVY) 1 MG/0.5ML SOAJ Inject '1mg'$  under the skin once a week 4 mL 0   Semaglutide-Weight Management (WEGOVY) 1.7 MG/0.75ML SOAJ inject 1.'7mg'$  Subcutaneous once a week 30 days 3 mL 0   Semaglutide-Weight Management (WEGOVY) 2.4 MG/0.75ML SOAJ Inject 2.4 mg into the skin once a week. 3 mL 6   Semaglutide-Weight Management (WEGOVY) 2.4 MG/0.75ML SOAJ  Inject 2.4 mg into the skin once a week. 3 mL 6   SODIUM FLUORIDE, DENTAL RINSE, (PREVIDENT) 0.2 % SOLN Use as an oral rinse as directed on package 473 mL 5   SODIUM FLUORIDE, DENTAL RINSE, (PREVIDENT) 0.2 % SOLN USE AS AN ORAL RINSE, AS DIRECTED ON PACKAGE 473 mL 5   No current facility-administered medications for this visit.      Psychiatric Specialty Exam: Review of Systems  Cardiovascular:  Negative for chest pain.  Neurological:  Negative for tremors.  Psychiatric/Behavioral:  Negative for substance abuse.     There were no vitals taken for this visit.There is no height or weight on file to calculate BMI.  General Appearance: casual  Eye Contact:   Speech:  Normal Rate  Volume:  Decreased  Mood: fair  Affect:    Thought Process:  Goal Directed  Orientation:  Full (Time, Place, and Person)  Thought Content:  Rumination  Suicidal Thoughts:  No  Homicidal Thoughts:  No  Memory:  Immediate;   Fair Recent;   Fair  Judgement:  Fair  Insight:  Shallow  Psychomotor Activity:  Decreased  Concentration:  Concentration: Fair and Attention Span: Fair  Recall:  [employer] of Knowledge:Fair  Language: Fair  Akathisia:  Negative  Handed:  Right  AIMS (if indicated):    Assets:  Social Support  ADL's:  Intact  Cognition: WNL  Sleep:  fair    Treatment Plan Summary: Medication management and Plan as follows    Prior  documentation reviewed  1. Major depression recurrent : manageable continue wellbutrin, lexapro  2. SOcial anxiety:;manageable continue lexapro 3. Panic attacks: less often, continue lexapro In therapy working  on cooping skills  Merian Capron, MD 2/2/20241:02 PM

## 2022-09-05 ENCOUNTER — Other Ambulatory Visit (HOSPITAL_BASED_OUTPATIENT_CLINIC_OR_DEPARTMENT_OTHER): Payer: Self-pay

## 2022-09-05 MED ORDER — WEGOVY 1 MG/0.5ML ~~LOC~~ SOAJ
1.0000 mg | SUBCUTANEOUS | 0 refills | Status: DC
  Filled 2022-09-05: qty 2, 28d supply, fill #0

## 2022-09-05 MED ORDER — WEGOVY 1.7 MG/0.75ML ~~LOC~~ SOAJ
1.7000 mg | SUBCUTANEOUS | 0 refills | Status: DC
  Filled 2022-09-05 – 2022-09-08 (×2): qty 3, 28d supply, fill #0

## 2022-09-06 ENCOUNTER — Other Ambulatory Visit (HOSPITAL_BASED_OUTPATIENT_CLINIC_OR_DEPARTMENT_OTHER): Payer: Self-pay

## 2022-09-07 ENCOUNTER — Other Ambulatory Visit (HOSPITAL_BASED_OUTPATIENT_CLINIC_OR_DEPARTMENT_OTHER): Payer: Self-pay

## 2022-09-08 ENCOUNTER — Other Ambulatory Visit (HOSPITAL_BASED_OUTPATIENT_CLINIC_OR_DEPARTMENT_OTHER): Payer: Self-pay

## 2022-09-30 ENCOUNTER — Other Ambulatory Visit (HOSPITAL_BASED_OUTPATIENT_CLINIC_OR_DEPARTMENT_OTHER): Payer: Self-pay

## 2022-09-30 MED ORDER — WEGOVY 1.7 MG/0.75ML ~~LOC~~ SOAJ
1.7000 mg | SUBCUTANEOUS | 0 refills | Status: DC
  Filled 2022-09-30: qty 3, 28d supply, fill #0

## 2022-10-19 ENCOUNTER — Other Ambulatory Visit (HOSPITAL_BASED_OUTPATIENT_CLINIC_OR_DEPARTMENT_OTHER): Payer: Self-pay

## 2022-10-24 ENCOUNTER — Other Ambulatory Visit (HOSPITAL_BASED_OUTPATIENT_CLINIC_OR_DEPARTMENT_OTHER): Payer: Self-pay

## 2022-10-25 ENCOUNTER — Other Ambulatory Visit (HOSPITAL_BASED_OUTPATIENT_CLINIC_OR_DEPARTMENT_OTHER): Payer: Self-pay

## 2022-10-25 MED ORDER — WEGOVY 1.7 MG/0.75ML ~~LOC~~ SOAJ
1.7000 mg | SUBCUTANEOUS | 3 refills | Status: DC
  Filled 2022-10-25: qty 3, 28d supply, fill #0
  Filled 2022-11-20: qty 3, 28d supply, fill #1

## 2022-11-07 ENCOUNTER — Other Ambulatory Visit (HOSPITAL_BASED_OUTPATIENT_CLINIC_OR_DEPARTMENT_OTHER): Payer: Self-pay

## 2022-11-07 ENCOUNTER — Other Ambulatory Visit: Payer: Self-pay

## 2022-11-21 ENCOUNTER — Other Ambulatory Visit: Payer: Self-pay

## 2022-11-21 ENCOUNTER — Other Ambulatory Visit (HOSPITAL_BASED_OUTPATIENT_CLINIC_OR_DEPARTMENT_OTHER): Payer: Self-pay

## 2023-02-15 DIAGNOSIS — I10 Essential (primary) hypertension: Secondary | ICD-10-CM | POA: Diagnosis not present

## 2023-02-15 DIAGNOSIS — E559 Vitamin D deficiency, unspecified: Secondary | ICD-10-CM | POA: Diagnosis not present

## 2023-03-02 ENCOUNTER — Other Ambulatory Visit: Payer: Self-pay | Admitting: Internal Medicine

## 2023-03-02 DIAGNOSIS — Z1231 Encounter for screening mammogram for malignant neoplasm of breast: Secondary | ICD-10-CM

## 2023-03-24 ENCOUNTER — Telehealth (INDEPENDENT_AMBULATORY_CARE_PROVIDER_SITE_OTHER): Payer: 59 | Admitting: Psychiatry

## 2023-03-24 ENCOUNTER — Other Ambulatory Visit (HOSPITAL_BASED_OUTPATIENT_CLINIC_OR_DEPARTMENT_OTHER): Payer: Self-pay

## 2023-03-24 ENCOUNTER — Encounter (HOSPITAL_COMMUNITY): Payer: Self-pay | Admitting: Psychiatry

## 2023-03-24 DIAGNOSIS — F4010 Social phobia, unspecified: Secondary | ICD-10-CM

## 2023-03-24 DIAGNOSIS — F332 Major depressive disorder, recurrent severe without psychotic features: Secondary | ICD-10-CM | POA: Diagnosis not present

## 2023-03-24 DIAGNOSIS — F410 Panic disorder [episodic paroxysmal anxiety] without agoraphobia: Secondary | ICD-10-CM | POA: Diagnosis not present

## 2023-03-24 MED ORDER — BUPROPION HCL ER (SR) 150 MG PO TB12
150.0000 mg | ORAL_TABLET | Freq: Every day | ORAL | 1 refills | Status: DC
  Filled 2023-03-24 – 2023-05-10 (×3): qty 30, 30d supply, fill #0

## 2023-03-24 MED ORDER — ESCITALOPRAM OXALATE 20 MG PO TABS
40.0000 mg | ORAL_TABLET | Freq: Every day | ORAL | 1 refills | Status: DC
  Filled 2023-03-24 – 2023-05-10 (×3): qty 60, 30d supply, fill #0

## 2023-03-24 NOTE — Progress Notes (Signed)
[facility] Outpatient Follow up visit  Patient Identification: Alexa [name] MRN:  [id number] Date of Evaluation:  03/24/2023 Referral Source: Counsellor Chief Complaint:   depression follow up . Visit Diagnosis:    ICD-10-CM   1. Social anxiety disorder  F40.10     2. Severe episode of recurrent major depressive disorder, without psychotic features ([facility])  F33.2     3. Panic attacks  F41.0     Virtual Visit via Video Note  I connected with Alexa [name] on 03/24/23 at 12:30 PM EDT by a video enabled telemedicine application and verified that I am speaking with the correct person using two identifiers.  Location: Patient: parked car Provider: home office   I discussed the limitations of evaluation and management by telemedicine and the availability of in person appointments. The patient expressed understanding and agreed to proceed.     I discussed the assessment and treatment plan with the patient. The patient was provided an opportunity to ask questions and all were answered. The patient agreed with the plan and demonstrated an understanding of the instructions.   The patient was advised to call back or seek an in-person evaluation if the symptoms worsen or if the condition fails to improve as anticipated.  I provided  20 minutes of non-face-to-face time during this encounter including chart review and documentation        History of Present Illness:  52  years old currently single African-American female initially referred by our counselor for management of depression and anxiety. Works with [employer]  Some stress at work and also headaches so getting eval by neurology as have history of brain tumor.   Great AUnt is sick and she takes care of her   Aggravating factors. Brain tumor.mom and aunt sick Modifying factors : family Severity of depression: manageable Social anxiety ; manageable   Past Psychiatric History: SSRI treatment for  depression and anxiety  Previous Psychotropic Medications: Yes   Substance Abuse History in the last 12 months:  No.  Consequences of Substance Abuse: NA  Past Medical History:  Past Medical History:  Diagnosis Date   Anemia    Anxiety    Depression    GERD (gastroesophageal reflux disease)    Headache    Hypertension    Meningioma ([facility])     Past Surgical History:  Procedure Laterality Date   CRANIOTOMY  05/26/2016   Procedure: SUBOCCIPITAL CRANIOTOMY WITH PLACEMENT OF VENTRICULAR CATHETER;  Surgeon: Coletta Memos, MD;  Location: [facility] OR;  Service: Neurosurgery;;  CRANIOTOMY - SUBOCCIPITAL   WISDOM TOOTH EXTRACTION      Family Psychiatric History: father side : depression most family members  Family History:  Family History  Problem Relation Age of Onset   Hypertension Mother    Heart disease Mother    Hypertension Father    Kidney disease Father    Ulcers Father    Diabetes Father        many paternal family members also with diabetes   Ovarian cancer Maternal Grandmother    Cirrhosis Paternal Grandfather        alcohol related   Thyroid cancer Maternal Aunt    Colon cancer Neg Hx    Esophageal cancer Neg Hx    Pancreatic cancer Neg Hx    Stomach cancer Neg Hx     Social History:   Social History   Socioeconomic History   Marital status: Single    Spouse name: Not on file  Number of children: Not on file   Years of education: Not on file   Highest education level: Not on file  Occupational History   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     Employer: [employer]  Tobacco Use   Smoking status: Never   Smokeless tobacco: Never  Vaping Use   Vaping status: Never Used  Substance and Sexual Activity   Alcohol use: Yes    Comment: socially 1/2 glass   Drug use: No   Sexual activity: Yes  Other Topics Concern   Not on file  Social History Narrative   Not on file   Social Determinants of Health   Financial Resource Strain: Not on file  Food  Insecurity: Not on file  Transportation Needs: Not on file  Physical Activity: Not on file  Stress: Not on file  Social Connections: Not on file      Allergies:  No Known Allergies  Metabolic Disorder Labs: No results found for: "HGBA1C", "MPG" No results found for: "PROLACTIN" No results found for: "CHOL", "TRIG", "HDL", "CHOLHDL", "VLDL", "LDLCALC"   Current Medications: Current Outpatient Medications  Medication Sig Dispense Refill   atenolol (TENORMIN) 50 MG tablet Take 50 mg by mouth daily.     atenolol (TENORMIN) 50 MG tablet TAKE 1 TABLET BY MOUTH DAILY 90 tablet 3   atenolol (TENORMIN) 50 MG tablet Take 1 tablet by mouth once a day 90 days 90 tablet 3   buPROPion (WELLBUTRIN SR) 150 MG 12 hr tablet Take 1 tablet (150 mg total) by mouth daily. 30 tablet 1   cephALEXin (KEFLEX) 250 MG capsule Take 1 capsule (250 mg total) by mouth 3 (three) times daily for 5 days. 15 capsule 0   chlorhexidine (PERIOGARD) 0.12 % solution RINSE MOUTH WITH (1 CAPFUL) FOR 30 SECONDS IN THE MORNING AND EVENING AFTER TOOTHBRUSHING. EXPECTORATE AFTER RINSING, DO NOT SWALLOW. 473 mL 0   Cholecalciferol (VITAMIN D) 50 MCG (2000 UT) CAPS Take 2 capsules (4,000 Units total) by mouth daily. 200 capsule 0   Cholecalciferol (VITAMIN D3) 50000 units TABS Take by mouth. Once per week     escitalopram (LEXAPRO) 20 MG tablet Take 2 tablets (40 mg total) by mouth daily. 60 tablet 1   ferrous sulfate 325 (65 FE) MG tablet Take 325 mg by mouth daily with breakfast.     hydrochlorothiazide (HYDRODIURIL) 12.5 MG tablet Take 12.5 mg by mouth daily.     hydrochlorothiazide (HYDRODIURIL) 12.5 MG tablet TAKE 1 TABLET BY MOUTH ONCE DAILY 90 tablet 3   hydrochlorothiazide (HYDRODIURIL) 12.5 MG tablet Take 1 tablet by mouth once a day 90 days 90 tablet 3   ibuprofen (ADVIL,MOTRIN) 200 MG tablet Take 200-400 mg by mouth 2 (two) times daily as needed for headache or moderate pain.     nirmatrelvir & ritonavir (PAXLOVID,  300/100,) 20 x 150 MG & 10 x 100MG  TBPK Take 3 tablets by mouth in the morning and at bedtime. 30 tablet 0   omeprazole (PRILOSEC OTC) 20 MG tablet Take 20 mg by mouth daily.     Semaglutide-Weight Management (WEGOVY) 0.5 MG/0.5ML SOAJ Inject 0.5 mg into the skin once a week. 2 mL 0   Semaglutide-Weight Management (WEGOVY) 1 MG/0.5ML SOAJ Inject 1mg  under the skin once a week 4 mL 0   Semaglutide-Weight Management (WEGOVY) 1 MG/0.5ML SOAJ Inject 1 mg into the skin once a week. 2 mL 0   Semaglutide-Weight Management (WEGOVY) 1.7 MG/0.75ML SOAJ Inject 1.7 mg into the  skin once a week. 3 mL 3   Semaglutide-Weight Management (WEGOVY) 2.4 MG/0.75ML SOAJ Inject 2.4 mg into the skin once a week. 3 mL 6   Semaglutide-Weight Management (WEGOVY) 2.4 MG/0.75ML SOAJ Inject 2.4 mg into the skin once a week. 3 mL 6   SODIUM FLUORIDE, DENTAL RINSE, (PREVIDENT) 0.2 % SOLN Use as an oral rinse as directed on package 473 mL 5   SODIUM FLUORIDE, DENTAL RINSE, (PREVIDENT) 0.2 % SOLN USE AS AN ORAL RINSE, AS DIRECTED ON PACKAGE 473 mL 5   No current facility-administered medications for this visit.      Psychiatric Specialty Exam: Review of Systems  Cardiovascular:  Negative for chest pain.  Neurological:  Negative for tremors.  Psychiatric/Behavioral:  Negative for substance abuse.     There were no vitals taken for this visit.There is no height or weight on file to calculate BMI.  General Appearance: casual  Eye Contact:   Speech:  Normal Rate  Volume:  Decreased  Mood: fair  Affect:    Thought Process:  Goal Directed  Orientation:  Full (Time, Place, and Person)  Thought Content:  Rumination  Suicidal Thoughts:  No  Homicidal Thoughts:  No  Memory:  Immediate;   Fair Recent;   Fair  Judgement:  Fair  Insight:  Shallow  Psychomotor Activity:  Decreased  Concentration:  Concentration: Fair and Attention Span: Fair  Recall:  [employer] of Knowledge:Fair  Language: Fair  Akathisia:  Negative   Handed:  Right  AIMS (if indicated):    Assets:  Social Support  ADL's:  Intact  Cognition: WNL  Sleep:  fair    Treatment Plan Summary: Medication management and Plan as follows    Prior documentation reviewed  1. Major depression recurrent : manageable, taking wellbutrin once a day, will keep that and not bid. Continue lexapro  2. SOcial anxiety:;manageable continue lexapro  3. Panic attacks: less often, gets stressed, understand to take some ME time or time off from work if needed. Continue lexapro   Fu 42m. Or earlier if needed   Thresa Ross, MD 8/23/202412:38 PM

## 2023-03-27 DIAGNOSIS — D329 Benign neoplasm of meninges, unspecified: Secondary | ICD-10-CM | POA: Diagnosis not present

## 2023-03-27 DIAGNOSIS — Z6831 Body mass index (BMI) 31.0-31.9, adult: Secondary | ICD-10-CM | POA: Diagnosis not present

## 2023-03-28 ENCOUNTER — Other Ambulatory Visit: Payer: Self-pay | Admitting: Neurosurgery

## 2023-03-28 DIAGNOSIS — D329 Benign neoplasm of meninges, unspecified: Secondary | ICD-10-CM

## 2023-03-31 ENCOUNTER — Encounter: Payer: Self-pay | Admitting: Neurosurgery

## 2023-04-05 ENCOUNTER — Other Ambulatory Visit (HOSPITAL_BASED_OUTPATIENT_CLINIC_OR_DEPARTMENT_OTHER): Payer: Self-pay

## 2023-04-18 ENCOUNTER — Encounter: Payer: Self-pay | Admitting: Neurosurgery

## 2023-04-27 DIAGNOSIS — Z01419 Encounter for gynecological examination (general) (routine) without abnormal findings: Secondary | ICD-10-CM | POA: Diagnosis not present

## 2023-04-27 DIAGNOSIS — Z124 Encounter for screening for malignant neoplasm of cervix: Secondary | ICD-10-CM | POA: Diagnosis not present

## 2023-04-27 DIAGNOSIS — Z1331 Encounter for screening for depression: Secondary | ICD-10-CM | POA: Diagnosis not present

## 2023-04-27 DIAGNOSIS — Z01411 Encounter for gynecological examination (general) (routine) with abnormal findings: Secondary | ICD-10-CM | POA: Diagnosis not present

## 2023-04-27 DIAGNOSIS — Z113 Encounter for screening for infections with a predominantly sexual mode of transmission: Secondary | ICD-10-CM | POA: Diagnosis not present

## 2023-04-27 DIAGNOSIS — Z1231 Encounter for screening mammogram for malignant neoplasm of breast: Secondary | ICD-10-CM | POA: Diagnosis not present

## 2023-05-07 ENCOUNTER — Ambulatory Visit
Admission: RE | Admit: 2023-05-07 | Discharge: 2023-05-07 | Disposition: A | Discharge status: Home / Self Care | Payer: Self-pay | Source: Ambulatory Visit | Attending: Neurosurgery | Admitting: Neurosurgery

## 2023-05-07 DIAGNOSIS — D329 Benign neoplasm of meninges, unspecified: Secondary | ICD-10-CM

## 2023-05-07 MED ORDER — GADOPICLENOL 0.5 MMOL/ML IV SOLN
10.0000 mL | Freq: Once | INTRAVENOUS | Status: AC | PRN
  Administered 2023-05-07: 10 mL via INTRAVENOUS

## 2023-05-10 ENCOUNTER — Other Ambulatory Visit: Payer: Self-pay

## 2023-05-10 ENCOUNTER — Other Ambulatory Visit (HOSPITAL_BASED_OUTPATIENT_CLINIC_OR_DEPARTMENT_OTHER): Payer: Self-pay

## 2023-05-15 DIAGNOSIS — Z6833 Body mass index (BMI) 33.0-33.9, adult: Secondary | ICD-10-CM | POA: Diagnosis not present

## 2023-05-15 DIAGNOSIS — D329 Benign neoplasm of meninges, unspecified: Secondary | ICD-10-CM | POA: Diagnosis not present

## 2023-05-19 ENCOUNTER — Other Ambulatory Visit (HOSPITAL_BASED_OUTPATIENT_CLINIC_OR_DEPARTMENT_OTHER): Payer: Self-pay

## 2023-05-21 ENCOUNTER — Other Ambulatory Visit (HOSPITAL_BASED_OUTPATIENT_CLINIC_OR_DEPARTMENT_OTHER): Payer: Self-pay

## 2023-05-21 MED ORDER — ATENOLOL 50 MG PO TABS
50.0000 mg | ORAL_TABLET | Freq: Every day | ORAL | 3 refills | Status: AC
  Filled 2023-05-21: qty 90, 90d supply, fill #0
  Filled 2023-12-27: qty 90, 90d supply, fill #1
  Filled 2023-12-27: qty 90, 90d supply, fill #0

## 2023-05-21 MED ORDER — HYDROCHLOROTHIAZIDE 12.5 MG PO TABS
12.5000 mg | ORAL_TABLET | Freq: Every day | ORAL | 3 refills | Status: AC
  Filled 2023-05-21: qty 90, 90d supply, fill #0
  Filled 2023-12-27: qty 90, 90d supply, fill #1
  Filled 2023-12-27: qty 90, 90d supply, fill #0

## 2023-05-25 ENCOUNTER — Encounter (HOSPITAL_COMMUNITY): Payer: Self-pay

## 2023-06-19 ENCOUNTER — Encounter (HOSPITAL_COMMUNITY): Payer: Self-pay | Admitting: Psychiatry

## 2023-06-19 ENCOUNTER — Telehealth (INDEPENDENT_AMBULATORY_CARE_PROVIDER_SITE_OTHER): Payer: 59 | Admitting: Psychiatry

## 2023-06-19 ENCOUNTER — Other Ambulatory Visit (HOSPITAL_BASED_OUTPATIENT_CLINIC_OR_DEPARTMENT_OTHER): Payer: Self-pay

## 2023-06-19 ENCOUNTER — Other Ambulatory Visit: Payer: Self-pay

## 2023-06-19 DIAGNOSIS — F410 Panic disorder [episodic paroxysmal anxiety] without agoraphobia: Secondary | ICD-10-CM | POA: Diagnosis not present

## 2023-06-19 DIAGNOSIS — F332 Major depressive disorder, recurrent severe without psychotic features: Secondary | ICD-10-CM

## 2023-06-19 DIAGNOSIS — F4010 Social phobia, unspecified: Secondary | ICD-10-CM

## 2023-06-19 MED ORDER — BUPROPION HCL ER (SR) 150 MG PO TB12
150.0000 mg | ORAL_TABLET | Freq: Every day | ORAL | 1 refills | Status: DC
  Filled 2023-06-19: qty 30, 30d supply, fill #0

## 2023-06-19 MED ORDER — ESCITALOPRAM OXALATE 20 MG PO TABS
40.0000 mg | ORAL_TABLET | Freq: Every day | ORAL | 1 refills | Status: DC
  Filled 2023-06-19: qty 60, 30d supply, fill #0

## 2023-06-19 NOTE — Progress Notes (Signed)
[facility] Outpatient Follow up visit  Patient Identification: Alexa [name] MRN:  [id number] Date of Evaluation:  06/19/2023 Referral Source: Counsellor Chief Complaint:   depression follow up . Visit Diagnosis:    ICD-10-CM   1. Social anxiety disorder  F40.10     2. Severe episode of recurrent major depressive disorder, without psychotic features (HCC)  F33.2     3. Panic attacks  F41.0      Virtual Visit via Video Note  I connected with Alexa [name] on 06/19/23 at 12:30 PM EST by a video enabled telemedicine application and verified that I am speaking with the correct person using two identifiers.  Location: Patient: parked car Provider: home office   I discussed the limitations of evaluation and management by telemedicine and the availability of in person appointments. The patient expressed understanding and agreed to proceed.      I discussed the assessment and treatment plan with the patient. The patient was provided an opportunity to ask questions and all were answered. The patient agreed with the plan and demonstrated an understanding of the instructions.   The patient was advised to call back or seek an in-person evaluation if the symptoms worsen or if the condition fails to improve as anticipated.  I provided 18 minutes of non-face-to-face time during this encounter.      History of Present Illness:  52  years old currently single African-American female initially referred by our counselor for management of depression and anxiety. Works with [employer] [place]  Has had eval again after headaches and found meningioma has grown some. Plans to wait and see. Following with providers  Overall mood is fair, some stress due to above    [place] AUnt is sick and she takes care of her   Aggravating factors. Brain tumor.mom and aunt sick Modifying factors : family Severity of depression: mmanageable Social anxiety ; fair   Past Psychiatric  History: SSRI treatment for depression and anxiety  Previous Psychotropic Medications: Yes   Substance Abuse History in the last 12 months:  No.  Consequences of Substance Abuse: NA  Past Medical History:  Past Medical History:  Diagnosis Date   Anemia    Anxiety    Depression    GERD (gastroesophageal reflux disease)    Headache    Hypertension    Meningioma (HCC)     Past Surgical History:  Procedure Laterality Date   CRANIOTOMY  05/26/2016   Procedure: SUBOCCIPITAL CRANIOTOMY WITH PLACEMENT OF VENTRICULAR CATHETER;  Surgeon: Coletta Memos, MD;  Location: [facility] OR;  Service: Neurosurgery;;  CRANIOTOMY - SUBOCCIPITAL   WISDOM TOOTH EXTRACTION      Family Psychiatric History: father side : depression most family members  Family History:  Family History  Problem Relation Age of Onset   Hypertension Mother    Heart disease Mother    Hypertension Father    Kidney disease Father    Ulcers Father    Diabetes Father        many paternal family members also with diabetes   Ovarian cancer Maternal Grandmother    Cirrhosis Paternal Grandfather        alcohol related   Thyroid cancer Maternal Aunt    Colon cancer Neg Hx    Esophageal cancer Neg Hx    Pancreatic cancer Neg Hx    Stomach cancer Neg Hx     Social History:   Social History   Socioeconomic History   Marital status: Single  Spouse name: Not on file   Number of children: Not on file   Years of education: Not on file   Highest education level: Not on file  Occupational History   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     Employer: [employer]  Tobacco Use   Smoking status: Never   Smokeless tobacco: Never  Vaping Use   Vaping status: Never Used  Substance and Sexual Activity   Alcohol use: Yes    Comment: socially 1/2 glass   Drug use: No   Sexual activity: Yes  Other Topics Concern   Not on file  Social History Narrative   Not on file   Social Determinants of Health   Financial  Resource Strain: Not on file  Food Insecurity: Not on file  Transportation Needs: Not on file  Physical Activity: Not on file  Stress: Not on file  Social Connections: Not on file      Allergies:  No Known Allergies  Metabolic Disorder Labs: No results found for: "HGBA1C", "MPG" No results found for: "PROLACTIN" No results found for: "CHOL", "TRIG", "HDL", "CHOLHDL", "VLDL", "LDLCALC"   Current Medications: Current Outpatient Medications  Medication Sig Dispense Refill   atenolol (TENORMIN) 50 MG tablet Take 50 mg by mouth daily.     atenolol (TENORMIN) 50 MG tablet TAKE 1 TABLET BY MOUTH DAILY 90 tablet 3   atenolol (TENORMIN) 50 MG tablet Take 1 tablet (50 mg total) by mouth daily. 90 tablet 3   buPROPion (WELLBUTRIN SR) 150 MG 12 hr tablet Take 1 tablet (150 mg total) by mouth daily. 30 tablet 1   cephALEXin (KEFLEX) 250 MG capsule Take 1 capsule (250 mg total) by mouth 3 (three) times daily for 5 days. 15 capsule 0   chlorhexidine (PERIOGARD) 0.12 % solution RINSE MOUTH WITH (1 CAPFUL) FOR 30 SECONDS IN THE MORNING AND EVENING AFTER TOOTHBRUSHING. EXPECTORATE AFTER RINSING, DO NOT SWALLOW. 473 mL 0   Cholecalciferol (VITAMIN D) 50 MCG (2000 UT) CAPS Take 2 capsules (4,000 Units total) by mouth daily. 200 capsule 0   Cholecalciferol (VITAMIN D3) 50000 units TABS Take by mouth. Once per week     escitalopram (LEXAPRO) 20 MG tablet Take 2 tablets (40 mg total) by mouth daily. 60 tablet 1   ferrous sulfate 325 (65 FE) MG tablet Take 325 mg by mouth daily with breakfast.     hydrochlorothiazide (HYDRODIURIL) 12.5 MG tablet Take 12.5 mg by mouth daily.     hydrochlorothiazide (HYDRODIURIL) 12.5 MG tablet TAKE 1 TABLET BY MOUTH ONCE DAILY 90 tablet 3   hydrochlorothiazide (HYDRODIURIL) 12.5 MG tablet Take 1 tablet (12.5 mg total) by mouth daily. 90 tablet 3   ibuprofen (ADVIL,MOTRIN) 200 MG tablet Take 200-400 mg by mouth 2 (two) times daily as needed for headache or moderate  pain.     nirmatrelvir & ritonavir (PAXLOVID, 300/100,) 20 x 150 MG & 10 x 100MG  TBPK Take 3 tablets by mouth in the morning and at bedtime. 30 tablet 0   omeprazole (PRILOSEC OTC) 20 MG tablet Take 20 mg by mouth daily.     Semaglutide-Weight Management (WEGOVY) 0.5 MG/0.5ML SOAJ Inject 0.5 mg into the skin once a week. 2 mL 0   Semaglutide-Weight Management (WEGOVY) 1 MG/0.5ML SOAJ Inject 1mg  under the skin once a week 4 mL 0   Semaglutide-Weight Management (WEGOVY) 1 MG/0.5ML SOAJ Inject 1 mg into the skin once a week. 2 mL 0   Semaglutide-Weight Management (WEGOVY) 1.7 MG/0.75ML SOAJ  Inject 1.7 mg into the skin once a week. 3 mL 3   Semaglutide-Weight Management (WEGOVY) 2.4 MG/0.75ML SOAJ Inject 2.4 mg into the skin once a week. 3 mL 6   Semaglutide-Weight Management (WEGOVY) 2.4 MG/0.75ML SOAJ Inject 2.4 mg into the skin once a week. 3 mL 6   SODIUM FLUORIDE, DENTAL RINSE, (PREVIDENT) 0.2 % SOLN Use as an oral rinse as directed on package 473 mL 5   SODIUM FLUORIDE, DENTAL RINSE, (PREVIDENT) 0.2 % SOLN USE AS AN ORAL RINSE, AS DIRECTED ON PACKAGE 473 mL 5   No current facility-administered medications for this visit.      Psychiatric Specialty Exam: Review of Systems  Cardiovascular:  Negative for chest pain.  Neurological:  Negative for tremors.  Psychiatric/Behavioral:  Negative for substance abuse.     There were no vitals taken for this visit.There is no height or weight on file to calculate BMI.  General Appearance: casual  Eye Contact:   Speech:  Normal Rate  Volume:  Decreased  Mood: fair  Affect:    Thought Process:  Goal Directed  Orientation:  Full (Time, Place, and Person)  Thought Content:  Rumination  Suicidal Thoughts:  No  Homicidal Thoughts:  No  Memory:  Immediate;   Fair Recent;   Fair  Judgement:  Fair  Insight:  Shallow  Psychomotor Activity:  Decreased  Concentration:  Concentration: Fair and Attention Span: Fair  Recall:  [employer] of  Knowledge:Fair  Language: Fair  Akathisia:  Negative  Handed:  Right  AIMS (if indicated):    Assets:  Social Support  ADL's:  Intact  Cognition: WNL  Sleep:  fair    Treatment Plan Summary: Medication management and Plan as follows    Prior documentation reviewed  1. Major depression recurrent : manageable continue wellbutrin, lexapro   2. SOcial anxiety:;baseline, continue lexapro   3. Panic attacks: manageable, continue lexapro Provided supportive therapy in regard to meningioma and stress due to that. Following with providers  Fu 25m.   Thresa Ross, MD 11/18/202412:38 PM

## 2023-06-30 ENCOUNTER — Other Ambulatory Visit (HOSPITAL_BASED_OUTPATIENT_CLINIC_OR_DEPARTMENT_OTHER): Payer: Self-pay

## 2023-10-10 ENCOUNTER — Other Ambulatory Visit: Payer: Self-pay | Admitting: Neurosurgery

## 2023-10-10 DIAGNOSIS — D329 Benign neoplasm of meninges, unspecified: Secondary | ICD-10-CM

## 2023-10-16 ENCOUNTER — Telehealth (INDEPENDENT_AMBULATORY_CARE_PROVIDER_SITE_OTHER): Payer: 59 | Admitting: Psychiatry

## 2023-10-16 ENCOUNTER — Encounter (HOSPITAL_COMMUNITY): Payer: Self-pay | Admitting: Psychiatry

## 2023-10-16 ENCOUNTER — Other Ambulatory Visit (HOSPITAL_BASED_OUTPATIENT_CLINIC_OR_DEPARTMENT_OTHER): Payer: Self-pay

## 2023-10-16 DIAGNOSIS — F410 Panic disorder [episodic paroxysmal anxiety] without agoraphobia: Secondary | ICD-10-CM

## 2023-10-16 DIAGNOSIS — F4010 Social phobia, unspecified: Secondary | ICD-10-CM

## 2023-10-16 DIAGNOSIS — F332 Major depressive disorder, recurrent severe without psychotic features: Secondary | ICD-10-CM | POA: Diagnosis not present

## 2023-10-16 MED ORDER — ESCITALOPRAM OXALATE 20 MG PO TABS
40.0000 mg | ORAL_TABLET | Freq: Every day | ORAL | 1 refills | Status: DC
  Filled 2023-10-16 – 2023-12-27 (×3): qty 60, 30d supply, fill #0

## 2023-10-16 MED ORDER — BUPROPION HCL ER (SR) 150 MG PO TB12
150.0000 mg | ORAL_TABLET | Freq: Every day | ORAL | 1 refills | Status: DC
  Filled 2023-10-16 – 2023-12-27 (×3): qty 30, 30d supply, fill #0

## 2023-10-16 NOTE — Progress Notes (Signed)
 [facility] Outpatient Follow up visit  Patient Identification: Alexa [name] MRN:  [id number] Date of Evaluation:  10/16/2023 Referral Source: Counsellor Chief Complaint:   depression follow up . Visit Diagnosis:    ICD-10-CM   1. Severe episode of recurrent major depressive disorder, without psychotic features (HCC)  F33.2     2. Social anxiety disorder  F40.10     3. Panic attacks  F41.0     Virtual Visit via Video Note  I connected with Alexa [name] on 10/16/23 at  1:30 PM EDT by a video enabled telemedicine application and verified that I am speaking with the correct person using two identifiers.  Location: Patient: parked car Provider: home office   I discussed the limitations of evaluation and management by telemedicine and the availability of in person appointments. The patient expressed understanding and agreed to proceed.     I discussed the assessment and treatment plan with the patient. The patient was provided an opportunity to ask questions and all were answered. The patient agreed with the plan and demonstrated an understanding of the instructions.   The patient was advised to call back or seek an in-person evaluation if the symptoms worsen or if the condition fails to improve as anticipated.  I provided 15 minutes of non-face-to-face time during this encounter.      History of Present Illness:  53  years old currently single African-American female initially referred by our counselor for management of depression and anxiety. Works with [place] [employer]  Patient lost her great Aunt she was care giving for her Family adjusting to the loss Overall doing fair and work is manageable Has been diagnosed with brain tumor,  wait and see for now     Aggravating factors. Brain tumor.mom  can be sick Modifying factors : family Severity of depression: mmanageable Social anxiety ; fair   Past Psychiatric History: SSRI treatment for depression  and anxiety  Previous Psychotropic Medications: Yes   Substance Abuse History in the last 12 months:  No.  Consequences of Substance Abuse: NA  Past Medical History:  Past Medical History:  Diagnosis Date   Anemia    Anxiety    Depression    GERD (gastroesophageal reflux disease)    Headache    Hypertension    Meningioma (HCC)     Past Surgical History:  Procedure Laterality Date   CRANIOTOMY  05/26/2016   Procedure: SUBOCCIPITAL CRANIOTOMY WITH PLACEMENT OF VENTRICULAR CATHETER;  Surgeon: Coletta Memos, MD;  Location: [facility] OR;  Service: Neurosurgery;;  CRANIOTOMY - SUBOCCIPITAL   WISDOM TOOTH EXTRACTION      Family Psychiatric History: father side : depression most family members  Family History:  Family History  Problem Relation Age of Onset   Hypertension Mother    Heart disease Mother    Hypertension Father    Kidney disease Father    Ulcers Father    Diabetes Father        many paternal family members also with diabetes   Ovarian cancer Maternal Grandmother    Cirrhosis Paternal Grandfather        alcohol related   Thyroid cancer Maternal Aunt    Colon cancer Neg Hx    Esophageal cancer Neg Hx    Pancreatic cancer Neg Hx    Stomach cancer Neg Hx     Social History:   Social History   Socioeconomic History   Marital status: Single    Spouse name: Not on file  Number of children: Not on file   Years of education: Not on file   Highest education level: Not on file  Occupational History   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     Employer: [employer]  Tobacco Use   Smoking status: Never   Smokeless tobacco: Never  Vaping Use   Vaping status: Never Used  Substance and Sexual Activity   Alcohol use: Yes    Comment: socially 1/2 glass   Drug use: No   Sexual activity: Yes  Other Topics Concern   Not on file  Social History Narrative   Not on file   Social Drivers of Health   Financial Resource Strain: Not on file  Food Insecurity: Not  on file  Transportation Needs: Not on file  Physical Activity: Not on file  Stress: Not on file  Social Connections: Not on file      Allergies:  No Known Allergies  Metabolic Disorder Labs: No results found for: "HGBA1C", "MPG" No results found for: "PROLACTIN" No results found for: "CHOL", "TRIG", "HDL", "CHOLHDL", "VLDL", "LDLCALC"   Current Medications: Current Outpatient Medications  Medication Sig Dispense Refill   atenolol (TENORMIN) 50 MG tablet Take 50 mg by mouth daily.     atenolol (TENORMIN) 50 MG tablet TAKE 1 TABLET BY MOUTH DAILY 90 tablet 3   atenolol (TENORMIN) 50 MG tablet Take 1 tablet (50 mg total) by mouth daily. 90 tablet 3   buPROPion (WELLBUTRIN SR) 150 MG 12 hr tablet Take 1 tablet (150 mg total) by mouth daily. 30 tablet 1   cephALEXin (KEFLEX) 250 MG capsule Take 1 capsule (250 mg total) by mouth 3 (three) times daily for 5 days. 15 capsule 0   chlorhexidine (PERIOGARD) 0.12 % solution RINSE MOUTH WITH (1 CAPFUL) FOR 30 SECONDS IN THE MORNING AND EVENING AFTER TOOTHBRUSHING. EXPECTORATE AFTER RINSING, DO NOT SWALLOW. 473 mL 0   Cholecalciferol (VITAMIN D) 50 MCG (2000 UT) CAPS Take 2 capsules (4,000 Units total) by mouth daily. 200 capsule 0   Cholecalciferol (VITAMIN D3) 50000 units TABS Take by mouth. Once per week     escitalopram (LEXAPRO) 20 MG tablet Take 2 tablets (40 mg total) by mouth daily. 60 tablet 1   ferrous sulfate 325 (65 FE) MG tablet Take 325 mg by mouth daily with breakfast.     hydrochlorothiazide (HYDRODIURIL) 12.5 MG tablet Take 12.5 mg by mouth daily.     hydrochlorothiazide (HYDRODIURIL) 12.5 MG tablet TAKE 1 TABLET BY MOUTH ONCE DAILY 90 tablet 3   hydrochlorothiazide (HYDRODIURIL) 12.5 MG tablet Take 1 tablet (12.5 mg total) by mouth daily. 90 tablet 3   ibuprofen (ADVIL,MOTRIN) 200 MG tablet Take 200-400 mg by mouth 2 (two) times daily as needed for headache or moderate pain.     nirmatrelvir & ritonavir (PAXLOVID, 300/100,)  20 x 150 MG & 10 x 100MG  TBPK Take 3 tablets by mouth in the morning and at bedtime. 30 tablet 0   omeprazole (PRILOSEC OTC) 20 MG tablet Take 20 mg by mouth daily.     Semaglutide-Weight Management (WEGOVY) 0.5 MG/0.5ML SOAJ Inject 0.5 mg into the skin once a week. 2 mL 0   Semaglutide-Weight Management (WEGOVY) 1 MG/0.5ML SOAJ Inject 1mg  under the skin once a week 4 mL 0   Semaglutide-Weight Management (WEGOVY) 1 MG/0.5ML SOAJ Inject 1 mg into the skin once a week. 2 mL 0   Semaglutide-Weight Management (WEGOVY) 1.7 MG/0.75ML SOAJ Inject 1.7 mg into the skin once  a week. 3 mL 3   Semaglutide-Weight Management (WEGOVY) 2.4 MG/0.75ML SOAJ Inject 2.4 mg into the skin once a week. 3 mL 6   Semaglutide-Weight Management (WEGOVY) 2.4 MG/0.75ML SOAJ Inject 2.4 mg into the skin once a week. 3 mL 6   SODIUM FLUORIDE, DENTAL RINSE, (PREVIDENT) 0.2 % SOLN Use as an oral rinse as directed on package 473 mL 5   SODIUM FLUORIDE, DENTAL RINSE, (PREVIDENT) 0.2 % SOLN USE AS AN ORAL RINSE, AS DIRECTED ON PACKAGE 473 mL 5   No current facility-administered medications for this visit.      Psychiatric Specialty Exam: Review of Systems  Cardiovascular:  Negative for chest pain.  Neurological:  Negative for tremors.  Psychiatric/Behavioral:  Negative for substance abuse.     There were no vitals taken for this visit.There is no height or weight on file to calculate BMI.  General Appearance: casual  Eye Contact:   Speech:  Normal Rate  Volume:  Decreased  Mood: fair  Affect:    Thought Process:  Goal Directed  Orientation:  Full (Time, Place, and Person)  Thought Content:  Rumination  Suicidal Thoughts:  No  Homicidal Thoughts:  No  Memory:  Immediate;   Fair Recent;   Fair  Judgement:  Fair  Insight:  Shallow  Psychomotor Activity:  Decreased  Concentration:  Concentration: Fair and Attention Span: Fair  Recall:  [employer] of Knowledge:Fair  Language: Fair  Akathisia:  Negative  Handed:   Right  AIMS (if indicated):    Assets:  Social Support  ADL's:  Intact  Cognition: WNL  Sleep:  fair    Treatment Plan Summary: Medication management and Plan as follows    Prior documentation reviewed  1. Major depression recurrent : manageable continue wellbutrin , lexapro Provided supportive therapy  2. SOcial anxiety:;baseline, continue lexapro   3. Panic attacks: manageable conitnue lexapro  Fu 21m. Renewed meds, call for concerns earlier if needed  Thresa Ross, MD 3/17/20251:37 PM

## 2023-10-27 ENCOUNTER — Other Ambulatory Visit (HOSPITAL_BASED_OUTPATIENT_CLINIC_OR_DEPARTMENT_OTHER): Payer: Self-pay

## 2023-11-01 DIAGNOSIS — F331 Major depressive disorder, recurrent, moderate: Secondary | ICD-10-CM | POA: Diagnosis not present

## 2023-11-09 ENCOUNTER — Ambulatory Visit
Admission: RE | Admit: 2023-11-09 | Discharge: 2023-11-09 | Disposition: A | Discharge status: Home / Self Care | Payer: Self-pay | Source: Ambulatory Visit | Attending: Neurosurgery | Admitting: Neurosurgery

## 2023-11-09 DIAGNOSIS — Z9889 Other specified postprocedural states: Secondary | ICD-10-CM | POA: Diagnosis not present

## 2023-11-09 DIAGNOSIS — D320 Benign neoplasm of cerebral meninges: Secondary | ICD-10-CM | POA: Diagnosis not present

## 2023-11-09 DIAGNOSIS — D329 Benign neoplasm of meninges, unspecified: Secondary | ICD-10-CM

## 2023-11-09 MED ORDER — GADOPICLENOL 0.5 MMOL/ML IV SOLN
10.0000 mL | Freq: Once | INTRAVENOUS | Status: AC | PRN
Start: 2023-11-09 — End: 2023-11-09
  Administered 2023-11-09: 10 mL via INTRAVENOUS

## 2023-11-16 DIAGNOSIS — F331 Major depressive disorder, recurrent, moderate: Secondary | ICD-10-CM | POA: Diagnosis not present

## 2023-11-22 DIAGNOSIS — F331 Major depressive disorder, recurrent, moderate: Secondary | ICD-10-CM | POA: Diagnosis not present

## 2023-11-23 DIAGNOSIS — F331 Major depressive disorder, recurrent, moderate: Secondary | ICD-10-CM | POA: Diagnosis not present

## 2023-11-27 DIAGNOSIS — F331 Major depressive disorder, recurrent, moderate: Secondary | ICD-10-CM | POA: Diagnosis not present

## 2023-11-28 DIAGNOSIS — D329 Benign neoplasm of meninges, unspecified: Secondary | ICD-10-CM | POA: Diagnosis not present

## 2023-12-13 ENCOUNTER — Other Ambulatory Visit (HOSPITAL_BASED_OUTPATIENT_CLINIC_OR_DEPARTMENT_OTHER): Payer: Self-pay

## 2023-12-13 MED ORDER — SODIUM FLUORIDE 0.2 % MT SOLN
OROMUCOSAL | 5 refills | Status: AC
  Filled 2023-12-13: qty 473, 15d supply, fill #0
  Filled 2023-12-27: qty 473, 30d supply, fill #0

## 2023-12-14 DIAGNOSIS — F331 Major depressive disorder, recurrent, moderate: Secondary | ICD-10-CM | POA: Diagnosis not present

## 2023-12-15 ENCOUNTER — Other Ambulatory Visit: Payer: Self-pay | Admitting: Neurosurgery

## 2023-12-18 DIAGNOSIS — F331 Major depressive disorder, recurrent, moderate: Secondary | ICD-10-CM | POA: Diagnosis not present

## 2023-12-21 ENCOUNTER — Other Ambulatory Visit (HOSPITAL_COMMUNITY): Payer: Self-pay | Admitting: Neurosurgery

## 2023-12-21 DIAGNOSIS — D329 Benign neoplasm of meninges, unspecified: Secondary | ICD-10-CM

## 2023-12-21 DIAGNOSIS — F331 Major depressive disorder, recurrent, moderate: Secondary | ICD-10-CM | POA: Diagnosis not present

## 2023-12-26 ENCOUNTER — Other Ambulatory Visit

## 2023-12-26 ENCOUNTER — Other Ambulatory Visit (HOSPITAL_COMMUNITY): Payer: Self-pay | Admitting: Neurosurgery

## 2023-12-26 ENCOUNTER — Other Ambulatory Visit (HOSPITAL_BASED_OUTPATIENT_CLINIC_OR_DEPARTMENT_OTHER): Payer: Self-pay

## 2023-12-26 DIAGNOSIS — D329 Benign neoplasm of meninges, unspecified: Secondary | ICD-10-CM

## 2023-12-27 ENCOUNTER — Other Ambulatory Visit: Payer: Self-pay | Admitting: Neurosurgery

## 2023-12-27 ENCOUNTER — Other Ambulatory Visit (HOSPITAL_COMMUNITY): Payer: Self-pay | Admitting: Neurosurgery

## 2023-12-27 ENCOUNTER — Other Ambulatory Visit (HOSPITAL_BASED_OUTPATIENT_CLINIC_OR_DEPARTMENT_OTHER): Payer: Self-pay

## 2023-12-27 ENCOUNTER — Encounter (HOSPITAL_BASED_OUTPATIENT_CLINIC_OR_DEPARTMENT_OTHER): Payer: Self-pay

## 2023-12-27 ENCOUNTER — Other Ambulatory Visit (HOSPITAL_COMMUNITY): Payer: Self-pay

## 2023-12-27 DIAGNOSIS — D329 Benign neoplasm of meninges, unspecified: Secondary | ICD-10-CM

## 2023-12-28 ENCOUNTER — Other Ambulatory Visit (HOSPITAL_BASED_OUTPATIENT_CLINIC_OR_DEPARTMENT_OTHER): Payer: Self-pay

## 2024-01-01 ENCOUNTER — Ambulatory Visit (HOSPITAL_COMMUNITY)
Admission: RE | Admit: 2024-01-01 | Discharge: 2024-01-01 | Disposition: A | Discharge status: Home / Self Care | Source: Ambulatory Visit | Attending: Neurosurgery | Admitting: Neurosurgery

## 2024-01-01 DIAGNOSIS — D320 Benign neoplasm of cerebral meninges: Secondary | ICD-10-CM | POA: Diagnosis not present

## 2024-01-01 DIAGNOSIS — G9389 Other specified disorders of brain: Secondary | ICD-10-CM | POA: Diagnosis not present

## 2024-01-01 DIAGNOSIS — R220 Localized swelling, mass and lump, head: Secondary | ICD-10-CM | POA: Diagnosis not present

## 2024-01-01 DIAGNOSIS — D329 Benign neoplasm of meninges, unspecified: Secondary | ICD-10-CM | POA: Diagnosis not present

## 2024-01-01 MED ORDER — GADOBUTROL 1 MMOL/ML IV SOLN
9.0000 mL | Freq: Once | INTRAVENOUS | Status: AC | PRN
  Administered 2024-01-01: 9 mL via INTRAVENOUS

## 2024-01-08 ENCOUNTER — Other Ambulatory Visit

## 2024-01-08 DIAGNOSIS — F331 Major depressive disorder, recurrent, moderate: Secondary | ICD-10-CM | POA: Diagnosis not present

## 2024-01-11 DIAGNOSIS — F331 Major depressive disorder, recurrent, moderate: Secondary | ICD-10-CM | POA: Diagnosis not present

## 2024-01-25 ENCOUNTER — Other Ambulatory Visit: Payer: Self-pay | Admitting: Neurosurgery

## 2024-01-25 ENCOUNTER — Other Ambulatory Visit (HOSPITAL_BASED_OUTPATIENT_CLINIC_OR_DEPARTMENT_OTHER): Payer: Self-pay | Admitting: Neurosurgery

## 2024-01-25 DIAGNOSIS — D329 Benign neoplasm of meninges, unspecified: Secondary | ICD-10-CM

## 2024-02-05 ENCOUNTER — Other Ambulatory Visit

## 2024-02-06 ENCOUNTER — Ambulatory Visit

## 2024-02-06 DIAGNOSIS — G9389 Other specified disorders of brain: Secondary | ICD-10-CM | POA: Diagnosis not present

## 2024-02-06 DIAGNOSIS — D329 Benign neoplasm of meninges, unspecified: Secondary | ICD-10-CM | POA: Diagnosis not present

## 2024-02-06 DIAGNOSIS — R519 Headache, unspecified: Secondary | ICD-10-CM | POA: Diagnosis not present

## 2024-02-06 DIAGNOSIS — D320 Benign neoplasm of cerebral meninges: Secondary | ICD-10-CM | POA: Diagnosis not present

## 2024-02-06 MED ORDER — IOHEXOL 350 MG/ML SOLN
100.0000 mL | Freq: Once | INTRAVENOUS | Status: AC | PRN
Start: 2024-02-06 — End: 2024-02-06
  Administered 2024-02-06: 75 mL via INTRAVENOUS

## 2024-02-07 NOTE — Progress Notes (Signed)
 Surgical Instructions   Your procedure is scheduled on Friday February 16, 2024. Report to [facility] Main Entrance A at 9:30 A.M., then check in with the Admitting office. Any questions or running late day of surgery: call [phone]  Questions prior to your surgery date: call [phone], Monday-Friday, 8am-4pm. If you experience any cold or flu symptoms such as cough, fever, chills, shortness of breath, etc. between now and your scheduled surgery, please notify us  at the above number.     Remember:  Do not eat or drink after midnight the night before your surgery  Take these medicines the morning of surgery with A SIP OF WATER  atenolol  (TENORMIN )  buPROPion  (WELLBUTRIN  SR)  escitalopram  (LEXAPRO )   May take these medicines IF NEEDED: acetaminophen  (TYLENOL )   DO NOT TAKE YOUR Semaglutide -Weight Management (WEGOVY ) 7 DAYS PRIOR TO SURGERY WITH THE LAST DOSE BEING NO LATER THAN 02/08/2024.     One week prior to surgery, STOP taking any Aspirin (unless otherwise instructed by your surgeon) Aleve, Naproxen, Ibuprofen, Motrin, Advil, Goody's, BC's, all herbal medications, fish oil, and non-prescription vitamins.                     Do NOT Smoke (Tobacco/Vaping) for 24 hours prior to your procedure.  If you use a CPAP at night, you may bring your mask/headgear for your overnight stay.   You will be asked to remove any contacts, glasses, piercing's, hearing aid's, dentures/partials prior to surgery. Please bring cases for these items if needed.    Patients discharged the day of surgery will not be allowed to drive home, and someone needs to stay with them for 24 hours.  SURGICAL WAITING ROOM VISITATION Patients may have no more than 2 support people in the waiting area - these visitors may rotate.   Pre-op nurse will coordinate an appropriate time for 1 ADULT support person, who may not rotate, to accompany patient in pre-op.  Children under the age of 30 must have an adult with  them who is not the patient and must remain in the main waiting area with an adult.  If the patient needs to stay at the hospital during part of their recovery, the visitor guidelines for inpatient rooms apply.  Please refer to the [facility] website for the visitor guidelines for any additional information.   If you received a COVID test during your pre-op visit  it is requested that you wear a mask when out in public, stay away from anyone that may not be feeling well and notify your surgeon if you develop symptoms. If you have been in contact with anyone that has tested positive in the last 10 days please notify you surgeon.      Pre-operative CHG Bathing Instructions   You can play a key role in reducing the risk of infection after surgery. Your skin needs to be as free of germs as possible. You can reduce the number of germs on your skin by washing with CHG (chlorhexidine  gluconate) soap before surgery. CHG is an antiseptic soap that kills germs and continues to kill germs even after washing.   DO NOT use if you have an allergy to chlorhexidine /CHG or antibacterial soaps. If your skin becomes reddened or irritated, stop using the CHG and notify one of our RNs at [phone].              TAKE A SHOWER THE NIGHT BEFORE SURGERY AND THE DAY OF SURGERY  Please keep in mind the following:  DO NOT shave, including legs and underarms, 48 hours prior to surgery.   Place clean sheets on your bed the night before surgery Use a clean washcloth (not used since being washed) for each shower. DO NOT sleep with pet's night before surgery.  CHG Shower Instructions:  Wash your face and private area with normal soap. If you choose to wash your hair, wash first with your normal shampoo.  After you use shampoo/soap, rinse your hair and body thoroughly to remove shampoo/soap residue.  Turn the water OFF and apply half the bottle of CHG soap to a CLEAN washcloth.  Apply CHG soap ONLY FROM YOUR NECK  DOWN TO YOUR TOES (washing for 3-5 minutes)  DO NOT use CHG soap on face, private areas, open wounds, or sores.  Pay special attention to the area where your surgery is being performed.  If you are having back surgery, having someone wash your back for you may be helpful. Wait 2 minutes after CHG soap is applied, then you may rinse off the CHG soap.  Pat dry with a clean towel  Put on clean pajamas    Additional instructions for the day of surgery: DO NOT APPLY any lotions, deodorants or perfumes.   Do not wear jewelry or makeup Do not wear nail polish, gel polish, artificial nails, or any other type of covering on natural nails (fingers and toes) Do not bring valuables to the hospital. [facility] is not responsible for valuables/personal belongings. Put on clean/comfortable clothes.  Please brush your teeth.  Ask your nurse before applying any prescription medications to the skin.

## 2024-02-08 ENCOUNTER — Encounter (HOSPITAL_COMMUNITY): Payer: Self-pay

## 2024-02-08 ENCOUNTER — Other Ambulatory Visit: Payer: Self-pay

## 2024-02-08 ENCOUNTER — Encounter (HOSPITAL_COMMUNITY)
Admission: RE | Admit: 2024-02-08 | Discharge: 2024-02-08 | Disposition: A | Discharge status: Home / Self Care | Source: Ambulatory Visit | Attending: Neurosurgery | Admitting: Neurosurgery

## 2024-02-08 VITALS — BP 144/75 | HR 57 | Temp 98.1°F | Resp 16 | Ht 68.5 in | Wt 219.1 lb

## 2024-02-08 DIAGNOSIS — Z01818 Encounter for other preprocedural examination: Secondary | ICD-10-CM | POA: Diagnosis not present

## 2024-02-08 DIAGNOSIS — F331 Major depressive disorder, recurrent, moderate: Secondary | ICD-10-CM | POA: Diagnosis not present

## 2024-02-08 DIAGNOSIS — I10 Essential (primary) hypertension: Secondary | ICD-10-CM | POA: Insufficient documentation

## 2024-02-08 DIAGNOSIS — Z01810 Encounter for preprocedural cardiovascular examination: Secondary | ICD-10-CM | POA: Diagnosis present

## 2024-02-08 DIAGNOSIS — Z01812 Encounter for preprocedural laboratory examination: Secondary | ICD-10-CM | POA: Diagnosis present

## 2024-02-08 LAB — CBC
HCT: 38 % (ref 36.0–46.0)
Hemoglobin: 12.7 g/dL (ref 12.0–15.0)
MCH: 28.5 pg (ref 26.0–34.0)
MCHC: 33.4 g/dL (ref 30.0–36.0)
MCV: 85.4 fL (ref 80.0–100.0)
Platelets: 363 K/uL (ref 150–400)
RBC: 4.45 MIL/uL (ref 3.87–5.11)
RDW: 12.1 % (ref 11.5–15.5)
WBC: 4.9 K/uL (ref 4.0–10.5)
nRBC: 0 % (ref 0.0–0.2)

## 2024-02-08 LAB — BASIC METABOLIC PANEL WITH GFR
Anion gap: 6 (ref 5–15)
BUN: 13 mg/dL (ref 6–20)
CO2: 28 mmol/L (ref 22–32)
Calcium: 9.7 mg/dL (ref 8.9–10.3)
Chloride: 103 mmol/L (ref 98–111)
Creatinine, Ser: 0.88 mg/dL (ref 0.44–1.00)
GFR, Estimated: >60 mL/min (ref >60)
Glucose, Bld: 92 mg/dL (ref 70–99)
Potassium: 4.4 mmol/L (ref 3.5–5.1)
Sodium: 137 mmol/L (ref 135–145)

## 2024-02-08 LAB — NO BLOOD PRODUCTS

## 2024-02-08 NOTE — Progress Notes (Signed)
 PCP - denies Cardiologist - denies  PPM/ICD - denies   Chest x-ray - 05/26/16 EKG - 02/08/24 Stress Test - 10 years ago per pt, in [place] , normal per pt ECHO - denies Cardiac Cath - denies  Sleep Study - denies   DM- denies  Last dose of GLP1 agonist-  n/a  ASA/Blood Thinner Instructions: n/a   ERAS Protcol - no, NPO   COVID TEST- n/a   Anesthesia review: no  Patient denies shortness of breath, fever, cough and chest pain at PAT appointment  Spoke with Shanda at Dr. Shelagh office. They are aware of pt's blood product refusal  All instructions explained to the patient, with a verbal understanding of the material. Patient agrees to go over the instructions while at home for a better understanding.  The opportunity to ask questions was provided.

## 2024-02-14 ENCOUNTER — Encounter (HOSPITAL_COMMUNITY): Payer: Self-pay | Admitting: Anesthesiology

## 2024-02-16 ENCOUNTER — Encounter (HOSPITAL_COMMUNITY): Payer: Self-pay | Admitting: Neurosurgery

## 2024-02-16 ENCOUNTER — Ambulatory Visit (HOSPITAL_COMMUNITY)
Admission: RE | Admit: 2024-02-16 | Discharge: 2024-02-16 | Disposition: A | Discharge status: Home / Self Care | Attending: Neurosurgery | Admitting: Neurosurgery

## 2024-02-16 ENCOUNTER — Other Ambulatory Visit: Payer: Self-pay

## 2024-02-16 DIAGNOSIS — Z01818 Encounter for other preprocedural examination: Secondary | ICD-10-CM

## 2024-02-16 DIAGNOSIS — Z9889 Other specified postprocedural states: Principal | ICD-10-CM

## 2024-02-16 DIAGNOSIS — Z538 Procedure and treatment not carried out for other reasons: Secondary | ICD-10-CM | POA: Insufficient documentation

## 2024-02-16 DIAGNOSIS — D329 Benign neoplasm of meninges, unspecified: Secondary | ICD-10-CM | POA: Diagnosis not present

## 2024-02-16 MED ORDER — CEFAZOLIN SODIUM-DEXTROSE 2-4 GM/100ML-% IV SOLN
INTRAVENOUS | Status: DC
Start: 2024-02-16 — End: 2024-02-16
  Filled 2024-02-16: qty 100

## 2024-02-16 MED ORDER — FENTANYL CITRATE (PF) 250 MCG/5ML IJ SOLN
INTRAMUSCULAR | Status: AC
  Filled 2024-02-16: qty 5

## 2024-02-16 MED ORDER — CHLORHEXIDINE GLUCONATE CLOTH 2 % EX PADS: 6.0000 | MEDICATED_PAD | Freq: Once | CUTANEOUS | Status: DC

## 2024-02-16 MED ORDER — ACETAMINOPHEN 500 MG PO TABS: 1000.0000 mg | ORAL_TABLET | Freq: Once | ORAL | Status: DC

## 2024-02-16 MED ORDER — LACTATED RINGERS IV SOLN: INTRAVENOUS | Status: DC

## 2024-02-16 MED ORDER — PROPOFOL 10 MG/ML IV BOLUS
INTRAVENOUS | Status: AC
  Filled 2024-02-16: qty 20

## 2024-02-16 MED ORDER — SODIUM CHLORIDE 0.9 % IV SOLN: INTRAVENOUS | Status: DC

## 2024-02-16 MED ORDER — CHLORHEXIDINE GLUCONATE 0.12 % MT SOLN
OROMUCOSAL | Status: AC
  Filled 2024-02-16: qty 15

## 2024-02-16 MED ORDER — CEFAZOLIN SODIUM-DEXTROSE 2-4 GM/100ML-% IV SOLN: 2.0000 g | INTRAVENOUS | Status: DC

## 2024-02-16 MED ORDER — ORAL CARE MOUTH RINSE: 15.0000 mL | Freq: Once | OROMUCOSAL | Status: DC

## 2024-02-16 MED ORDER — CHLORHEXIDINE GLUCONATE 0.12 % MT SOLN: 15.0000 mL | Freq: Once | OROMUCOSAL | Status: DC

## 2024-02-16 NOTE — Progress Notes (Signed)
 Pt's surgery rescheduled due to OR availability. Pt escorted to main entrance, discharged home with family.

## 2024-02-19 NOTE — Progress Notes (Signed)
 SDW. Called patient and updated her arrival day and time to 02/21/2024 at 0530. Last Wegovy  was 3 months ago.

## 2024-02-20 NOTE — Anesthesia Preprocedure Evaluation (Signed)
 Anesthesia Evaluation  Patient identified by MRN, date of birth, ID band Patient awake    Reviewed: Allergy & Precautions, NPO status , Patient's Chart, lab work & pertinent test results, reviewed documented beta blocker date and time   History of Anesthesia Complications Negative for: history of anesthetic complications  Airway Mallampati: II  TM Distance: >3 FB Neck ROM: Full    Dental no notable dental hx.    Pulmonary neg pulmonary ROS   Pulmonary exam normal        Cardiovascular hypertension, Pt. on medications and Pt. on home beta blockers Normal cardiovascular exam     Neuro/Psych  Headaches  Anxiety Depression    Meningioma    GI/Hepatic Neg liver ROS,GERD  Controlled,,  Endo/Other  On Wegovy   Renal/GU negative Renal ROS     Musculoskeletal negative musculoskeletal ROS (+)    Abdominal   Peds  Hematology  (+) REFUSES BLOOD PRODUCTS, JEHOVAH'S WITNESS  Anesthesia Other Findings Day of surgery medications reviewed with patient.  Reproductive/Obstetrics                              Anesthesia Physical Anesthesia Plan  ASA: 3  Anesthesia Plan: General   Post-op Pain Management: Tylenol  PO (pre-op)*   Induction: Intravenous  PONV Risk Score and Plan: 3 and Treatment may vary due to age or medical condition, Ondansetron , Dexamethasone , Midazolam , Propofol  infusion and TIVA  Airway Management Planned: Oral ETT  Additional Equipment: Arterial line  Intra-op Plan:   Post-operative Plan: Extubation in OR  Informed Consent: I have reviewed the patients History and Physical, chart, labs and discussed the procedure including the risks, benefits and alternatives for the proposed anesthesia with the patient or authorized representative who has indicated his/her understanding and acceptance.     Dental advisory given  Plan Discussed with: CRNA  Anesthesia Plan Comments:  (Patient refuses blood products. Will accept albumin. Lawence, MD)         Anesthesia Quick Evaluation

## 2024-02-21 ENCOUNTER — Inpatient Hospital Stay (HOSPITAL_COMMUNITY): Payer: Self-pay | Admitting: Anesthesiology

## 2024-02-21 ENCOUNTER — Inpatient Hospital Stay (HOSPITAL_COMMUNITY)
Admission: RE | Admit: 2024-02-21 | Discharge: 2024-02-29 | DRG: 026 | Disposition: A | Discharge status: Home / Self Care | Attending: Neurosurgery | Admitting: Neurosurgery

## 2024-02-21 ENCOUNTER — Encounter (HOSPITAL_COMMUNITY): Payer: Self-pay | Admitting: Neurosurgery

## 2024-02-21 ENCOUNTER — Encounter (HOSPITAL_COMMUNITY): Admission: RE | Payer: Self-pay | Source: Home / Self Care

## 2024-02-21 ENCOUNTER — Inpatient Hospital Stay: Admit: 2024-02-21 | Admitting: Neurosurgery

## 2024-02-21 ENCOUNTER — Other Ambulatory Visit: Payer: Self-pay

## 2024-02-21 ENCOUNTER — Encounter (HOSPITAL_COMMUNITY)
Admission: RE | Disposition: A | Discharge status: Home / Self Care | Payer: Self-pay | Source: Home / Self Care | Attending: Neurosurgery

## 2024-02-21 DIAGNOSIS — D62 Acute posthemorrhagic anemia: Secondary | ICD-10-CM | POA: Diagnosis not present

## 2024-02-21 DIAGNOSIS — D649 Anemia, unspecified: Secondary | ICD-10-CM | POA: Diagnosis not present

## 2024-02-21 DIAGNOSIS — D320 Benign neoplasm of cerebral meninges: Secondary | ICD-10-CM | POA: Diagnosis not present

## 2024-02-21 DIAGNOSIS — K219 Gastro-esophageal reflux disease without esophagitis: Secondary | ICD-10-CM | POA: Diagnosis present

## 2024-02-21 DIAGNOSIS — Z531 Procedure and treatment not carried out because of patient's decision for reasons of belief and group pressure: Secondary | ICD-10-CM | POA: Diagnosis present

## 2024-02-21 DIAGNOSIS — R519 Headache, unspecified: Secondary | ICD-10-CM | POA: Diagnosis not present

## 2024-02-21 DIAGNOSIS — Z79899 Other long term (current) drug therapy: Secondary | ICD-10-CM

## 2024-02-21 DIAGNOSIS — Z833 Family history of diabetes mellitus: Secondary | ICD-10-CM

## 2024-02-21 DIAGNOSIS — F418 Other specified anxiety disorders: Secondary | ICD-10-CM

## 2024-02-21 DIAGNOSIS — D496 Neoplasm of unspecified behavior of brain: Secondary | ICD-10-CM | POA: Diagnosis not present

## 2024-02-21 DIAGNOSIS — I10 Essential (primary) hypertension: Secondary | ICD-10-CM | POA: Diagnosis present

## 2024-02-21 DIAGNOSIS — Z841 Family history of disorders of kidney and ureter: Secondary | ICD-10-CM

## 2024-02-21 DIAGNOSIS — F419 Anxiety disorder, unspecified: Secondary | ICD-10-CM | POA: Diagnosis present

## 2024-02-21 DIAGNOSIS — Z9889 Other specified postprocedural states: Secondary | ICD-10-CM | POA: Diagnosis not present

## 2024-02-21 DIAGNOSIS — Z91018 Allergy to other foods: Secondary | ICD-10-CM | POA: Diagnosis not present

## 2024-02-21 DIAGNOSIS — Z8041 Family history of malignant neoplasm of ovary: Secondary | ICD-10-CM | POA: Diagnosis not present

## 2024-02-21 DIAGNOSIS — F32A Depression, unspecified: Secondary | ICD-10-CM | POA: Diagnosis present

## 2024-02-21 DIAGNOSIS — Z8249 Family history of ischemic heart disease and other diseases of the circulatory system: Secondary | ICD-10-CM | POA: Diagnosis not present

## 2024-02-21 DIAGNOSIS — D329 Benign neoplasm of meninges, unspecified: Secondary | ICD-10-CM | POA: Diagnosis present

## 2024-02-21 DIAGNOSIS — Z808 Family history of malignant neoplasm of other organs or systems: Secondary | ICD-10-CM | POA: Diagnosis not present

## 2024-02-21 HISTORY — DX: Nausea with vomiting, unspecified: R11.2

## 2024-02-21 HISTORY — PX: APPLICATION OF CRANIAL NAVIGATION: SHX6578

## 2024-02-21 HISTORY — PX: CRANIOTOMY: SHX93

## 2024-02-21 HISTORY — DX: Nausea with vomiting, unspecified: Z98.890

## 2024-02-21 LAB — CBC
HCT: 35.9 % — ABNORMAL LOW (ref 36.0–46.0)
Hemoglobin: 11.8 g/dL — ABNORMAL LOW (ref 12.0–15.0)
MCH: 28.3 pg (ref 26.0–34.0)
MCHC: 32.9 g/dL (ref 30.0–36.0)
MCV: 86.1 fL (ref 80.0–100.0)
Platelets: 310 K/uL (ref 150–400)
RBC: 4.17 MIL/uL (ref 3.87–5.11)
RDW: 12.2 % (ref 11.5–15.5)
WBC: 4.9 K/uL (ref 4.0–10.5)
nRBC: 0 % (ref 0.0–0.2)

## 2024-02-21 LAB — POCT I-STAT 7, (LYTES, BLD GAS, ICA,H+H)
Acid-base deficit: 6 mmol/L — ABNORMAL HIGH (ref 0.0–2.0)
Bicarbonate: 19.4 mmol/L — ABNORMAL LOW (ref 20.0–28.0)
Calcium, Ion: 1.15 mmol/L (ref 1.15–1.40)
HCT: 28 % — ABNORMAL LOW (ref 36.0–46.0)
Hemoglobin: 9.5 g/dL — ABNORMAL LOW (ref 12.0–15.0)
O2 Saturation: 99 %
Patient temperature: 34.7
Potassium: 3.2 mmol/L — ABNORMAL LOW (ref 3.5–5.1)
Sodium: 144 mmol/L (ref 135–145)
TCO2: 21 mmol/L — ABNORMAL LOW (ref 22–32)
pCO2 arterial: 32.7 mmHg (ref 32–48)
pH, Arterial: 7.37 (ref 7.35–7.45)
pO2, Arterial: 136 mmHg — ABNORMAL HIGH (ref 83–108)

## 2024-02-21 LAB — POCT PREGNANCY, URINE: Preg Test, Ur: NEGATIVE

## 2024-02-21 LAB — CREATININE, SERUM
Creatinine, Ser: 0.76 mg/dL (ref 0.44–1.00)
GFR, Estimated: >60 mL/min (ref >60)

## 2024-02-21 LAB — MRSA NEXT GEN BY PCR, NASAL: MRSA by PCR Next Gen: NOT DETECTED

## 2024-02-21 SURGERY — CRANIOTOMY TUMOR EXCISION
Anesthesia: General

## 2024-02-21 MED ORDER — ATENOLOL 50 MG PO TABS: 50.0000 mg | ORAL_TABLET | Freq: Every day | ORAL | Status: DC

## 2024-02-21 MED ORDER — THROMBIN 20000 UNITS EX SOLR
CUTANEOUS | Status: AC
  Filled 2024-02-21: qty 20000

## 2024-02-21 MED ORDER — ONDANSETRON HCL 4 MG/2ML IJ SOLN
INTRAMUSCULAR | Status: DC | PRN
  Administered 2024-02-21 (×2): 4 mg via INTRAVENOUS

## 2024-02-21 MED ORDER — 0.9 % SODIUM CHLORIDE (POUR BTL) OPTIME
TOPICAL | Status: DC | PRN
  Administered 2024-02-21: 3000 mL

## 2024-02-21 MED ORDER — CEFAZOLIN SODIUM-DEXTROSE 2-3 GM-%(50ML) IV SOLR
INTRAVENOUS | Status: DC | PRN
  Administered 2024-02-21 (×2): 2 g via INTRAVENOUS

## 2024-02-21 MED ORDER — DEXAMETHASONE SODIUM PHOSPHATE 4 MG/ML IJ SOLN: 4.0000 mg | Freq: Three times a day (TID) | INTRAMUSCULAR | Status: DC

## 2024-02-21 MED ORDER — GLYCOPYRROLATE 0.2 MG/ML IJ SOLN
INTRAMUSCULAR | Status: DC | PRN
  Administered 2024-02-21: .2 mg via INTRAVENOUS

## 2024-02-21 MED ORDER — CHLORHEXIDINE GLUCONATE 0.12 % MT SOLN
15.0000 mL | Freq: Two times a day (BID) | OROMUCOSAL | Status: DC
  Administered 2024-02-21 – 2024-02-28 (×14): 15 mL via OROMUCOSAL
  Filled 2024-02-21 (×12): qty 15

## 2024-02-21 MED ORDER — DROPERIDOL 2.5 MG/ML IJ SOLN: 0.6250 mg | Freq: Once | INTRAMUSCULAR | Status: DC | PRN

## 2024-02-21 MED ORDER — DEXAMETHASONE 4 MG PO TABS: 4.0000 mg | ORAL_TABLET | Freq: Three times a day (TID) | ORAL | Status: DC

## 2024-02-21 MED ORDER — ONDANSETRON HCL 4 MG PO TABS
4.0000 mg | ORAL_TABLET | ORAL | Status: DC | PRN
  Administered 2024-02-27: 4 mg via ORAL
  Filled 2024-02-21: qty 1

## 2024-02-21 MED ORDER — SUGAMMADEX SODIUM 200 MG/2ML IV SOLN
INTRAVENOUS | Status: AC
  Filled 2024-02-21: qty 2

## 2024-02-21 MED ORDER — MICROFIBRILLAR COLL HEMOSTAT EX POWD
CUTANEOUS | Status: AC
  Filled 2024-02-21: qty 1

## 2024-02-21 MED ORDER — ROCURONIUM BROMIDE 10 MG/ML (PF) SYRINGE
PREFILLED_SYRINGE | INTRAVENOUS | Status: DC | PRN
  Administered 2024-02-21 (×4): 20 mg via INTRAVENOUS
  Administered 2024-02-21: 60 mg via INTRAVENOUS

## 2024-02-21 MED ORDER — PROMETHAZINE HCL 25 MG PO TABS: 12.5000 mg | ORAL_TABLET | ORAL | Status: DC | PRN

## 2024-02-21 MED ORDER — LABETALOL HCL 5 MG/ML IV SOLN: 10.0000 mg | INTRAVENOUS | Status: DC | PRN

## 2024-02-21 MED ORDER — HYDROMORPHONE HCL 1 MG/ML IJ SOLN
INTRAMUSCULAR | Status: AC
Start: 2024-02-21 — End: 2024-02-21
  Filled 2024-02-21: qty 1

## 2024-02-21 MED ORDER — PROPOFOL 10 MG/ML IV BOLUS
INTRAVENOUS | Status: DC | PRN
  Administered 2024-02-21: 200 mg via INTRAVENOUS

## 2024-02-21 MED ORDER — PROPOFOL 1000 MG/100ML IV EMUL
INTRAVENOUS | Status: AC
  Filled 2024-02-21: qty 300

## 2024-02-21 MED ORDER — SODIUM CHLORIDE 0.9 % IV SOLN: INTRAVENOUS | Status: DC

## 2024-02-21 MED ORDER — CHLORHEXIDINE GLUCONATE CLOTH 2 % EX PADS
6.0000 | MEDICATED_PAD | Freq: Every day | CUTANEOUS | Status: DC
  Administered 2024-02-21 – 2024-02-23 (×3): 6 via TOPICAL

## 2024-02-21 MED ORDER — PROPOFOL 500 MG/50ML IV EMUL
INTRAVENOUS | Status: DC | PRN
  Administered 2024-02-21: 125 ug/kg/min via INTRAVENOUS

## 2024-02-21 MED ORDER — THROMBIN 5000 UNITS EX KIT
PACK | CUTANEOUS | Status: AC
  Filled 2024-02-21: qty 1

## 2024-02-21 MED ORDER — DEXAMETHASONE SODIUM PHOSPHATE 10 MG/ML IJ SOLN
INTRAMUSCULAR | Status: DC | PRN
  Administered 2024-02-21: 10 mg via INTRAVENOUS

## 2024-02-21 MED ORDER — LIDOCAINE-EPINEPHRINE 0.5 %-1:200000 IJ SOLN
INTRAMUSCULAR | Status: AC
  Filled 2024-02-21: qty 50

## 2024-02-21 MED ORDER — ACETAMINOPHEN 10 MG/ML IV SOLN: 1000.0000 mg | Freq: Once | INTRAVENOUS | Status: DC | PRN

## 2024-02-21 MED ORDER — LIDOCAINE 2% (20 MG/ML) 5 ML SYRINGE
INTRAMUSCULAR | Status: AC
  Filled 2024-02-21: qty 5

## 2024-02-21 MED ORDER — BACITRACIN ZINC 500 UNIT/GM EX OINT
TOPICAL_OINTMENT | CUTANEOUS | Status: DC | PRN
  Administered 2024-02-21: 1 via TOPICAL

## 2024-02-21 MED ORDER — POTASSIUM CHLORIDE IN NACL 20-0.9 MEQ/L-% IV SOLN
INTRAVENOUS | Status: DC
  Filled 2024-02-21 (×2): qty 1000

## 2024-02-21 MED ORDER — HEPARIN SODIUM (PORCINE) 5000 UNIT/ML IJ SOLN
5000.0000 [IU] | Freq: Three times a day (TID) | INTRAMUSCULAR | Status: DC
  Administered 2024-02-21 – 2024-02-25 (×13): 5000 [IU] via SUBCUTANEOUS
  Filled 2024-02-21 (×13): qty 1

## 2024-02-21 MED ORDER — CLEVIDIPINE BUTYRATE 0.5 MG/ML IV EMUL
0.0000 mg/h | INTRAVENOUS | Status: DC
  Administered 2024-02-21 (×2): 2 mg/h via INTRAVENOUS
  Administered 2024-02-21: 12 mg/h via INTRAVENOUS
  Administered 2024-02-22 (×2): 20 mg/h via INTRAVENOUS
  Administered 2024-02-22: 18 mg/h via INTRAVENOUS
  Administered 2024-02-22: 14 mg/h via INTRAVENOUS
  Filled 2024-02-21: qty 100
  Filled 2024-02-21 (×2): qty 50
  Filled 2024-02-21 (×3): qty 100
  Filled 2024-02-21: qty 50

## 2024-02-21 MED ORDER — FENTANYL CITRATE (PF) 250 MCG/5ML IJ SOLN
INTRAMUSCULAR | Status: AC
Start: 2024-02-21 — End: 2024-02-21
  Filled 2024-02-21: qty 5

## 2024-02-21 MED ORDER — SODIUM CHLORIDE 0.9 % IV SOLN: INTRAVENOUS | Status: DC | PRN

## 2024-02-21 MED ORDER — HEMOSTATIC AGENTS (NO CHARGE) OPTIME
TOPICAL | Status: DC | PRN
  Administered 2024-02-21: 1 via TOPICAL

## 2024-02-21 MED ORDER — ROCURONIUM BROMIDE 10 MG/ML (PF) SYRINGE
PREFILLED_SYRINGE | INTRAVENOUS | Status: AC
  Filled 2024-02-21: qty 10

## 2024-02-21 MED ORDER — THROMBIN 20000 UNITS EX SOLR
CUTANEOUS | Status: DC | PRN
  Administered 2024-02-21: 10 mL via TOPICAL

## 2024-02-21 MED ORDER — ESCITALOPRAM OXALATE 10 MG PO TABS
40.0000 mg | ORAL_TABLET | Freq: Every day | ORAL | Status: DC
  Administered 2024-02-22 – 2024-02-29 (×8): 40 mg via ORAL
  Filled 2024-02-21 (×9): qty 4

## 2024-02-21 MED ORDER — DEXAMETHASONE SODIUM PHOSPHATE 4 MG/ML IJ SOLN: 4.0000 mg | Freq: Four times a day (QID) | INTRAMUSCULAR | Status: DC

## 2024-02-21 MED ORDER — ORAL CARE MOUTH RINSE
15.0000 mL | OROMUCOSAL | Status: DC | PRN
Start: 2024-02-21 — End: 2024-02-29

## 2024-02-21 MED ORDER — LIDOCAINE 2% (20 MG/ML) 5 ML SYRINGE
INTRAMUSCULAR | Status: DC | PRN
  Administered 2024-02-21: 100 mg via INTRAVENOUS

## 2024-02-21 MED ORDER — ACETAMINOPHEN 325 MG PO TABS
650.0000 mg | ORAL_TABLET | ORAL | Status: DC | PRN
  Administered 2024-02-22 – 2024-02-29 (×20): 650 mg via ORAL
  Filled 2024-02-21 (×20): qty 2

## 2024-02-21 MED ORDER — BUPROPION HCL ER (SR) 150 MG PO TB12
150.0000 mg | ORAL_TABLET | Freq: Every day | ORAL | Status: DC
  Administered 2024-02-22 – 2024-02-29 (×8): 150 mg via ORAL
  Filled 2024-02-21 (×10): qty 1

## 2024-02-21 MED ORDER — MORPHINE SULFATE (PF) 2 MG/ML IV SOLN
1.0000 mg | INTRAVENOUS | Status: DC | PRN
  Administered 2024-02-22: 2 mg via INTRAVENOUS
  Filled 2024-02-21 (×2): qty 1

## 2024-02-21 MED ORDER — ATENOLOL 50 MG PO TABS
50.0000 mg | ORAL_TABLET | Freq: Every day | ORAL | Status: DC
Start: 2024-02-22 — End: 2024-02-29
  Administered 2024-02-22 – 2024-02-29 (×8): 50 mg via ORAL
  Filled 2024-02-21 (×2): qty 1
  Filled 2024-02-21: qty 2
  Filled 2024-02-21 (×5): qty 1

## 2024-02-21 MED ORDER — PROCHLORPERAZINE EDISYLATE 10 MG/2ML IJ SOLN
5.0000 mg | Freq: Four times a day (QID) | INTRAMUSCULAR | Status: DC | PRN
  Administered 2024-02-27 (×2): 5 mg via INTRAVENOUS
  Filled 2024-02-21 (×4): qty 2

## 2024-02-21 MED ORDER — MANNITOL 20 % IV SOLN: INTRAVENOUS | Status: DC | PRN

## 2024-02-21 MED ORDER — HYDROMORPHONE HCL 1 MG/ML IJ SOLN
0.2500 mg | INTRAMUSCULAR | Status: DC | PRN
  Administered 2024-02-21 (×2): 0.5 mg via INTRAVENOUS

## 2024-02-21 MED ORDER — CEFAZOLIN SODIUM-DEXTROSE 2-4 GM/100ML-% IV SOLN
INTRAVENOUS | Status: AC
  Filled 2024-02-21: qty 100

## 2024-02-21 MED ORDER — DEXAMETHASONE SODIUM PHOSPHATE 10 MG/ML IJ SOLN
INTRAMUSCULAR | Status: AC
  Filled 2024-02-21: qty 1

## 2024-02-21 MED ORDER — PROPOFOL 10 MG/ML IV BOLUS
INTRAVENOUS | Status: AC
  Filled 2024-02-21: qty 20

## 2024-02-21 MED ORDER — PANTOPRAZOLE SODIUM 40 MG IV SOLR
40.0000 mg | Freq: Every day | INTRAVENOUS | Status: DC
  Administered 2024-02-21: 40 mg via INTRAVENOUS
  Filled 2024-02-21: qty 10

## 2024-02-21 MED ORDER — ONDANSETRON HCL 4 MG/2ML IJ SOLN
INTRAMUSCULAR | Status: AC
  Filled 2024-02-21: qty 2

## 2024-02-21 MED ORDER — DEXAMETHASONE 4 MG PO TABS
6.0000 mg | ORAL_TABLET | Freq: Four times a day (QID) | ORAL | Status: DC
  Filled 2024-02-21: qty 2

## 2024-02-21 MED ORDER — ACETAMINOPHEN 500 MG PO TABS
1000.0000 mg | ORAL_TABLET | Freq: Once | ORAL | Status: AC
  Administered 2024-02-21: 1000 mg via ORAL
  Filled 2024-02-21: qty 2

## 2024-02-21 MED ORDER — ORAL CARE MOUTH RINSE: 15.0000 mL | Freq: Once | OROMUCOSAL | Status: AC

## 2024-02-21 MED ORDER — SODIUM CHLORIDE 0.9 % IV SOLN
12.5000 mg | Freq: Four times a day (QID) | INTRAVENOUS | Status: DC | PRN
  Administered 2024-02-21: 12.5 mg via INTRAVENOUS
  Filled 2024-02-21 (×3): qty 0.5

## 2024-02-21 MED ORDER — FENTANYL CITRATE (PF) 250 MCG/5ML IJ SOLN
INTRAMUSCULAR | Status: DC | PRN
  Administered 2024-02-21: 50 ug via INTRAVENOUS

## 2024-02-21 MED ORDER — PHENYLEPHRINE HCL-NACL 20-0.9 MG/250ML-% IV SOLN
INTRAVENOUS | Status: DC | PRN
  Administered 2024-02-21: 30 ug/min via INTRAVENOUS

## 2024-02-21 MED ORDER — BACITRACIN ZINC 500 UNIT/GM EX OINT
TOPICAL_OINTMENT | CUTANEOUS | Status: AC
  Filled 2024-02-21: qty 28.35

## 2024-02-21 MED ORDER — NALOXONE HCL 0.4 MG/ML IJ SOLN
0.0800 mg | INTRAMUSCULAR | Status: DC | PRN
  Administered 2024-02-26: .08 mg via INTRAVENOUS

## 2024-02-21 MED ORDER — THROMBIN 5000 UNITS EX SOLR
OROMUCOSAL | Status: DC | PRN
  Administered 2024-02-21: 5 mL via TOPICAL

## 2024-02-21 MED ORDER — SUGAMMADEX SODIUM 200 MG/2ML IV SOLN
INTRAVENOUS | Status: DC | PRN
  Administered 2024-02-21: 200 mg via INTRAVENOUS

## 2024-02-21 MED ORDER — ONDANSETRON HCL 4 MG/2ML IJ SOLN
4.0000 mg | INTRAMUSCULAR | Status: DC | PRN
Start: 2024-02-21 — End: 2024-02-23
  Administered 2024-02-21 – 2024-02-22 (×3): 4 mg via INTRAVENOUS
  Filled 2024-02-21 (×4): qty 2

## 2024-02-21 MED ORDER — HYDRALAZINE HCL 20 MG/ML IJ SOLN
10.0000 mg | Freq: Four times a day (QID) | INTRAMUSCULAR | Status: DC | PRN
  Administered 2024-02-21: 10 mg via INTRAVENOUS
  Filled 2024-02-21: qty 1

## 2024-02-21 MED ORDER — LEVETIRACETAM (KEPPRA) 500 MG/5 ML ADULT IV PUSH
500.0000 mg | Freq: Two times a day (BID) | INTRAVENOUS | Status: DC
  Administered 2024-02-21 – 2024-02-23 (×4): 500 mg via INTRAVENOUS
  Filled 2024-02-21 (×4): qty 5

## 2024-02-21 MED ORDER — PROPOFOL 1000 MG/100ML IV EMUL
INTRAVENOUS | Status: AC
  Filled 2024-02-21: qty 100

## 2024-02-21 MED ORDER — CEFAZOLIN SODIUM 1 G IJ SOLR
INTRAMUSCULAR | Status: AC
  Filled 2024-02-21: qty 20

## 2024-02-21 MED ORDER — DEXAMETHASONE SODIUM PHOSPHATE 10 MG/ML IJ SOLN
6.0000 mg | Freq: Four times a day (QID) | INTRAMUSCULAR | Status: DC
  Administered 2024-02-21 – 2024-02-22 (×3): 6 mg via INTRAVENOUS
  Filled 2024-02-21 (×3): qty 1

## 2024-02-21 MED ORDER — HYDROCHLOROTHIAZIDE 12.5 MG PO TABS
12.5000 mg | ORAL_TABLET | Freq: Every day | ORAL | Status: DC
Start: 2024-02-21 — End: 2024-02-29
  Administered 2024-02-22 – 2024-02-29 (×8): 12.5 mg via ORAL
  Filled 2024-02-21 (×9): qty 1

## 2024-02-21 MED ORDER — ACETAMINOPHEN 650 MG RE SUPP: 650.0000 mg | RECTAL | Status: DC | PRN

## 2024-02-21 MED ORDER — SODIUM CHLORIDE 0.9 % IV SOLN
0.1000 ug/kg/min | INTRAVENOUS | Status: DC
  Administered 2024-02-21: .2 ug/kg/min via INTRAVENOUS
  Filled 2024-02-21 (×6): qty 5000

## 2024-02-21 MED ORDER — DEXAMETHASONE 4 MG PO TABS: 4.0000 mg | ORAL_TABLET | Freq: Four times a day (QID) | ORAL | Status: DC

## 2024-02-21 MED ORDER — ATENOLOL 50 MG PO TABS: 50.0000 mg | ORAL_TABLET | Freq: Once | ORAL | Status: DC

## 2024-02-21 MED ORDER — CHLORHEXIDINE GLUCONATE 0.12 % MT SOLN
15.0000 mL | Freq: Once | OROMUCOSAL | Status: AC
  Administered 2024-02-21: 15 mL via OROMUCOSAL
  Filled 2024-02-21: qty 15

## 2024-02-21 MED ORDER — HYDROCODONE-ACETAMINOPHEN 5-325 MG PO TABS
1.0000 | ORAL_TABLET | ORAL | Status: DC | PRN
  Administered 2024-02-27 – 2024-02-29 (×9): 1 via ORAL
  Filled 2024-02-21 (×11): qty 1

## 2024-02-21 MED ORDER — LIDOCAINE-EPINEPHRINE 0.5 %-1:200000 IJ SOLN
INTRAMUSCULAR | Status: DC | PRN
  Administered 2024-02-21: 10 mL

## 2024-02-21 MED ORDER — SODIUM FLUORIDE 0.2 % MT SOLN: 1.0000 | Freq: Every day | OROMUCOSAL | Status: DC | PRN

## 2024-02-21 SURGICAL SUPPLY — 73 items
BAG COUNTER SPONGE SURGICOUNT (BAG) ×2 IMPLANT
BAND RUBBER #18 3X1/16 STRL (MISCELLANEOUS) ×4 IMPLANT
BENZOIN TINCTURE PRP APPL 2/3 (GAUZE/BANDAGES/DRESSINGS) IMPLANT
BLADE CLIPPER SURG (BLADE) ×2 IMPLANT
BLADE SAW GIGLI 16 STRL (MISCELLANEOUS) IMPLANT
BLADE SURG 15 STRL LF DISP TIS (BLADE) IMPLANT
BLADE ULTRA TIP 2M (BLADE) IMPLANT
BNDG GAUZE DERMACEA FLUFF 4 (GAUZE/BANDAGES/DRESSINGS) IMPLANT
BUR ACORN 6.0 PRECISION (BURR) ×2 IMPLANT
BUR MATCHSTICK NEURO 3.0 LAGG (BURR) IMPLANT
BUR SPIRAL ROUTER 2.3 (BUR) IMPLANT
CANISTER SUCTION 3000ML PPV (SUCTIONS) ×2 IMPLANT
CASSETTE SUCT IRRIG SONOPET IQ (MISCELLANEOUS) IMPLANT
CATH VENTRIC 35X38 W/TROCAR LG (CATHETERS) IMPLANT
CLIP TI MEDIUM 6 (CLIP) IMPLANT
CNTNR URN SCR LID CUP LEK RST (MISCELLANEOUS) ×2 IMPLANT
COVERAGE SUPPORT O-ARM STEALTH (MISCELLANEOUS) ×2 IMPLANT
DRAIN SUBARACHNOID (WOUND CARE) IMPLANT
DRAPE CAMERA VIDEO/LASER (DRAPES) IMPLANT
DRAPE MICROSCOPE SLANT 54X150 (MISCELLANEOUS) IMPLANT
DRAPE NEUROLOGICAL W/INCISE (DRAPES) ×2 IMPLANT
DRAPE SURG 17X23 STRL (DRAPES) IMPLANT
DRAPE SURG ORHT 6 SPLT 77X108 (DRAPES) IMPLANT
DRAPE WARM FLUID 44X44 (DRAPES) ×2 IMPLANT
DRSG AQUACEL AG ADV 3.5X10 (GAUZE/BANDAGES/DRESSINGS) IMPLANT
DURAPREP 6ML APPLICATOR 50/CS (WOUND CARE) ×2 IMPLANT
ELECTRODE REM PT RTRN 9FT ADLT (ELECTROSURGICAL) ×2 IMPLANT
EVACUATOR 1/8 PVC DRAIN (DRAIN) IMPLANT
EVACUATOR SILICONE 100CC (DRAIN) IMPLANT
FEE COVERAGE SUPPORT O-ARM (MISCELLANEOUS) ×2 IMPLANT
GAUZE 4X4 16PLY ~~LOC~~+RFID DBL (SPONGE) IMPLANT
GAUZE SPONGE 4X4 12PLY STRL (GAUZE/BANDAGES/DRESSINGS) ×2 IMPLANT
GLOVE ECLIPSE 6.5 STRL STRAW (GLOVE) ×4 IMPLANT
GLOVE EXAM NITRILE XL STR (GLOVE) IMPLANT
GOWN STRL REUS W/ TWL LRG LVL3 (GOWN DISPOSABLE) ×4 IMPLANT
GOWN STRL REUS W/ TWL XL LVL3 (GOWN DISPOSABLE) IMPLANT
GOWN STRL REUS W/TWL 2XL LVL3 (GOWN DISPOSABLE) IMPLANT
GRAFT SUTURABLE BP 6CMX8CM (Tissue) IMPLANT
HEMOSTAT SURGICEL 2X14 (HEMOSTASIS) IMPLANT
HOOK DURA 1/2IN (MISCELLANEOUS) ×2 IMPLANT
KIT BASIN OR (CUSTOM PROCEDURE TRAY) ×2 IMPLANT
KIT DRAIN CSF ACCUDRAIN (MISCELLANEOUS) IMPLANT
KIT TURNOVER KIT B (KITS) ×2 IMPLANT
MARKER SPHERE PSV REFLC 13MM (MARKER) ×6 IMPLANT
NDL HYPO 25X1 1.5 SAFETY (NEEDLE) ×2 IMPLANT
NDL SPNL 18GX3.5 QUINCKE PK (NEEDLE) IMPLANT
NEEDLE HYPO 25X1 1.5 SAFETY (NEEDLE) ×2 IMPLANT
NEEDLE SPNL 18GX3.5 QUINCKE PK (NEEDLE) IMPLANT
NS IRRIG 1000ML POUR BTL (IV SOLUTION) ×6 IMPLANT
PACK CRANIOTOMY CUSTOM (CUSTOM PROCEDURE TRAY) ×2 IMPLANT
PATTIES SURGICAL .5 X3 (DISPOSABLE) IMPLANT
PLATE CRANIAL 12 2H RIGID UNI (Plate) IMPLANT
SCREW UNIII AXS SD 1.5X4 (Screw) IMPLANT
SEALANT ADHERUS EXTEND TIP (MISCELLANEOUS) IMPLANT
SPECIMEN JAR SMALL (MISCELLANEOUS) IMPLANT
SPIKE FLUID TRANSFER (MISCELLANEOUS) ×2 IMPLANT
SPONGE NEURO XRAY DETECT 1X3 (DISPOSABLE) IMPLANT
SPONGE SURGIFOAM ABS GEL 100 (HEMOSTASIS) ×2 IMPLANT
STAPLER SKIN PROX 35W (STAPLE) ×2 IMPLANT
SUT 3-0 BLK 1X30 PSL (SUTURE) IMPLANT
SUT ETHILON 3 0 PS 1 (SUTURE) IMPLANT
SUT NURALON 4 0 TR CR/8 (SUTURE) ×6 IMPLANT
SUT SILK 0 TIES 10X30 (SUTURE) IMPLANT
SUT STEEL 0 18XMFL TIE 17 (SUTURE) IMPLANT
SUT VIC AB 2-0 CT2 18 VCP726D (SUTURE) ×4 IMPLANT
TIP TISSUE SONOPET IQ STD 12 (TIP) IMPLANT
TOWEL GREEN STERILE (TOWEL DISPOSABLE) ×2 IMPLANT
TOWEL GREEN STERILE FF (TOWEL DISPOSABLE) ×2 IMPLANT
TRAY FOLEY MTR SLVR 16FR STAT (SET/KITS/TRAYS/PACK) ×2 IMPLANT
TUBE CONNECTING 12X1/4 (SUCTIONS) ×2 IMPLANT
TUBING FEATHERFLOW (TUBING) IMPLANT
UNDERPAD 30X36 HEAVY ABSORB (UNDERPADS AND DIAPERS) ×2 IMPLANT
WATER STERILE IRR 1000ML POUR (IV SOLUTION) ×2 IMPLANT

## 2024-02-21 NOTE — H&P (Addendum)
 BP (!) 146/91   Pulse (!) 57   Temp 98.2 F (36.8 C) (Oral)   Resp 18   Ht 5' 8.5 (1.74 m)   Wt 99.3 kg   LMP 12/26/2023 (Approximate)   SpO2 97%   BMI 32.81 kg/m  Alexa [name] presents with a recurrent meningioma which is now in both the supratentorial  and infratentorial spAces on the left. The tumor has grown into and has occluded the transverse sinus,and most of the sigmoid. We have watched this with serial exams the growth is easily seen. She will go back today for resection of the tumor.  Allergies  Allergen Reactions   Watermelon [Citrullus Vulgaris] Nausea And Vomiting   Past Medical History:  Diagnosis Date   Anemia    Anxiety    Depression    GERD (gastroesophageal reflux disease)    Headache    Hypertension    Meningioma (HCC)    Past Surgical History:  Procedure Laterality Date   CRANIOTOMY  05/26/2016   Procedure: SUBOCCIPITAL CRANIOTOMY WITH PLACEMENT OF VENTRICULAR CATHETER;  Surgeon: Rockey Peru, MD;  Location: [facility] OR;  Service: Neurosurgery;;  CRANIOTOMY - SUBOCCIPITAL   WISDOM TOOTH EXTRACTION     Family History  Problem Relation Age of Onset   Hypertension Mother    Heart disease Mother    Hypertension Father    Kidney disease Father    Ulcers Father    Diabetes Father        many paternal family members also with diabetes   Ovarian cancer Maternal Grandmother    Cirrhosis Paternal Grandfather        alcohol  related   Thyroid  cancer Maternal Aunt    Colon cancer Neg Hx    Esophageal cancer Neg Hx    Pancreatic cancer Neg Hx    Stomach cancer Neg Hx    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     Employer: [employer]  Tobacco Use   Smoking status: Never   Smokeless tobacco: Never  Vaping Use   Vaping status: Never Used   Substance and Sexual Activity   Alcohol  use: Yes    Comment: socially, maybe a glass every few months   Drug use: No   Sexual activity: Yes  Other Topics Concern   Not on file  Social History Narrative   Not on file   Social Drivers of Health   Financial Resource Strain: Not on file  Food Insecurity: Not on file  Transportation Needs: Not on file  Physical Activity: Not on file  Stress: Not on file  Social Connections: Not on file  Intimate Partner Violence: Not on file   Physical Exam Constitutional:      Appearance: Normal appearance. She is normal weight.  HENT:     Head: Normocephalic and atraumatic.     Right Ear: External ear normal.     Left Ear: External ear normal.     Nose: Nose normal.     Mouth/Throat:     Mouth: Mucous membranes are moist.     Pharynx: Oropharynx is clear.  Cardiovascular:     Rate and Rhythm: Normal rate and regular rhythm.  Pulmonary:     Effort: Pulmonary effort is normal.  Breath sounds: Normal breath sounds.  Abdominal:     General: Abdomen is flat.  Musculoskeletal:     Cervical back: Normal range of motion and neck supple.  Skin:    General: Skin is warm.  Neurological:     General: No focal deficit present.     Mental Status: She is alert and oriented to person, place, and time. Mental status is at baseline.     Cranial Nerves: No cranial nerve deficit.     Sensory: No sensory deficit.     Motor: No weakness.     Coordination: Coordination normal.     Gait: Gait normal.     Deep Tendon Reflexes: Reflexes normal.  Psychiatric:        Mood and Affect: Mood normal.        Behavior: Behavior normal.        Thought Content: Thought content normal.        Judgment: Judgment normal.   OR for tumor resection. Risks including stroke, coma, death, brain damage, visual field loss, weakness, change in speech, inability, and other risks. She has agreed and wishes to proceed with the operation.

## 2024-02-21 NOTE — Anesthesia Procedure Notes (Signed)
 Procedure Name: Intubation Date/Time: 02/21/2024 8:27 AM  Performed by: Julien Manus, CRNAPre-anesthesia Checklist: Patient identified, Emergency Drugs available, Suction available and Patient being monitored Patient Re-evaluated:Patient Re-evaluated prior to induction Oxygen Delivery Method: Circle System Utilized Preoxygenation: Pre-oxygenation with 100% oxygen Induction Type: IV induction Ventilation: Mask ventilation without difficulty Laryngoscope Size: Mac and 3 Grade View: Grade I Tube type: Oral Tube size: 7.0 mm Number of attempts: 1 Airway Equipment and Method: Stylet and Oral airway Placement Confirmation: ETT inserted through vocal cords under direct vision, positive ETCO2 and breath sounds checked- equal and bilateral Secured at: 21 cm Tube secured with: Tape Dental Injury: Teeth and Oropharynx as per pre-operative assessment

## 2024-02-21 NOTE — Transfer of Care (Signed)
 Immediate Anesthesia Transfer of Care Note  Patient: Alexa [name]  Procedure(s) Performed: CRANIOTOMY TUMOR EXCISION (Left) COMPUTER-ASSISTED NAVIGATION, FOR CRANIAL PROCEDURE  Patient Location: PACU  Anesthesia Type:General  Level of Consciousness: drowsy and patient cooperative  Airway & Oxygen Therapy: Patient Spontanous Breathing and Patient connected to face mask oxygen  Post-op Assessment: Report given to RN and Post -op Vital signs reviewed and stable  Post vital signs: Reviewed and stable  Last Vitals:  Vitals Value Taken Time  BP 140/80 02/21/24 13:35  Temp    Pulse 52 02/21/24 13:43  Resp 14 02/21/24 13:43  SpO2 92 % 02/21/24 13:43  Vitals shown include unfiled device data.  Last Pain:  Vitals:   02/21/24 0629  TempSrc:   PainSc: 0-No pain         Complications: No notable events documented.

## 2024-02-21 NOTE — Anesthesia Postprocedure Evaluation (Signed)
 Anesthesia Post Note  Patient: Alexa [name]  Procedure(s) Performed: CRANIOTOMY TUMOR EXCISION (Left) COMPUTER-ASSISTED NAVIGATION, FOR CRANIAL PROCEDURE     Patient location during evaluation: PACU Anesthesia Type: General Level of consciousness: awake and alert Pain management: pain level controlled Vital Signs Assessment: post-procedure vital signs reviewed and stable Respiratory status: spontaneous breathing, nonlabored ventilation and respiratory function stable Cardiovascular status: blood pressure returned to baseline Postop Assessment: no apparent nausea or vomiting Anesthetic complications: no   No notable events documented.  Last Vitals:  Vitals:   02/21/24 1415 02/21/24 1430  BP: (!) 123/90 (!) 134/93  Pulse: (!) 54 (!) 49  Resp: 11 13  Temp:    SpO2: 100% 100%    Last Pain:  Vitals:   02/21/24 1335  TempSrc:   PainSc: 0-No pain                 Vertell Row

## 2024-02-21 NOTE — Anesthesia Procedure Notes (Signed)
 Arterial Line Insertion Start/End7/23/2025 8:30 AM, 02/21/2024 8:35 AM Performed by: Paul Lamarr BRAVO, MD, anesthesiologist  Patient location: OR. Preanesthetic checklist: patient identified, IV checked, site marked, risks and benefits discussed, surgical consent, monitors and equipment checked, pre-op evaluation, timeout performed and anesthesia consent Patient sedated Right, radial was placed Catheter size: 20 G Hand hygiene performed  and maximum sterile barriers used   Attempts: 2 Procedure performed without using ultrasound guided technique. Following insertion, dressing applied and Biopatch. Post procedure assessment: normal and unchanged  Patient tolerated the procedure well with no immediate complications.

## 2024-02-21 NOTE — Consult Note (Signed)
 NAME:  Alexa [name], MRN:  [id number], DOB:  [birth year], LOS: 0 ADMISSION DATE:  02/21/2024, CONSULTATION DATE:  7/23  REFERRING MD:  Gillie CHIEF COMPLAINT:  Recurrent Meningioma   History of Present Illness:  Patient is a 53 year old female with significant past medical history of hypertension, headache, GERD, anxiety, depression and recurrent meningioma who presents for scheduled resection of meningioma by Dr. Gillie.  PCCM consulted for assistance in management during ICU stay.  Pertinent  Medical History   Past Medical History:  Diagnosis Date   Anemia    Anxiety    Depression    GERD (gastroesophageal reflux disease)    Headache    Hypertension    Meningioma (HCC)      Significant Hospital Events: Including procedures, antibiotic start and stop dates in addition to other pertinent events   7/23 meningioma resection, PCCM consult  Interim History / Subjective:  Stable postop Alert and orient x 4, no neurodeficits  Objective    Blood pressure (!) 146/91, pulse (!) 57, temperature 98.2 F (36.8 C), temperature source Oral, resp. rate 18, height 5' 8.5 (1.74 m), weight 99.3 kg, last menstrual period 12/26/2023, SpO2 97%.        Intake/Output Summary (Last 24 hours) at 02/21/2024 1043 Last data filed at 02/21/2024 1037 Gross per 24 hour  Intake 837 ml  Output 895 ml  Net -58 ml   Filed Weights   02/21/24 0604  Weight: 99.3 kg    Examination: General: Acute on chronic adult female, lying in ICU bed NAD postop craniotomy HENT: Normocephalic, left surgical incision s/p craniotomy, PERRLA intact-2 mm B/L, teeth intact, pink MM Lungs: Clear, diminished in lower bases, in no cute distress Cardiovascular: S1, S2, RRR-sinus bradycardia-heart rate 40s, no JVD, no murmur Abdomen: Bowel sounds active, soft Extremities: Moves all extremities to command,no deformities Neuro: Alert and orient x 4, follows commands, no focal neurodeficits at this time GU:  Intact  Resolved problem list   Assessment and Plan  Left craniotomy s/p meningioma resection Recurrent meningioma hx-01/01/24 MRI 5.3 x 3.9 x 4.1 cm  Headache hx  P: Continue ICU monitoring, and cardiac telemetry Monitor frequent neurochecks Continue neuroprotective measures-euglycemia, euvolemia, normoxia, normocapnia, keep electrolytes within normal limits-trend per BMET Placed on seizure precautions Seizure Prophylaxis- Keppra  BID  Ancef  for surgical prophylaxis  Multimodal pain management  SBP goal < 160, monitor via aline  Hypertension Bradycardia- on atenolol , HR on EKG 7/10 52 Sinus Brady On home HCTZ 12.5 mg and atenolol  50 mg P: Continue hydrochlorothiazide  12.5mg  daily  Hold atenolol  for now due to bradycardia- HR low 40s, restart tomorrow  Will use hydralazine  10-20mg  for SBP goal < 160   Anxiety Depression On home Lexapro  20 mg and Wellbutrin  150mg    P: Restart lexapro  and Wellbutrin   Continue supportive care  GERD P: Protonix  40 mg IV    Best Practice (right click and Reselect all SmartList Selections daily)-   Per Primary    Labs   CBC: No results for input(s): WBC, NEUTROABS, HGB, HCT, MCV, PLT in the last 168 hours.  Basic Metabolic Panel: No results for input(s): NA, K, CL, CO2, GLUCOSE, BUN, CREATININE, CALCIUM, MG, PHOS in the last 168 hours. GFR: Estimated Creatinine Clearance: 92 mL/min (by C-G formula based on SCr of 0.88 mg/dL). No results for input(s): PROCALCITON, WBC, LATICACIDVEN in the last 168 hours.  Liver Function Tests: No results for input(s): AST, ALT, ALKPHOS, BILITOT, PROT, ALBUMIN in the last 168 hours. No results for  input(s): LIPASE, AMYLASE in the last 168 hours. No results for input(s): AMMONIA in the last 168 hours.  ABG    Component Value Date/Time   PHART 7.419 05/26/2016 1539   PCO2ART 34.6 05/26/2016 1539   PO2ART 257.0 (H) 05/26/2016 1539   HCO3 22.7  05/26/2016 1539   TCO2 24 05/26/2016 1539   ACIDBASEDEF 2.0 05/26/2016 1539   O2SAT 100.0 05/26/2016 1539     Coagulation Profile: No results for input(s): INR, PROTIME in the last 168 hours.  Cardiac Enzymes: No results for input(s): CKTOTAL, CKMB, CKMBINDEX, TROPONINI in the last 168 hours.  HbA1C: No results found for: HGBA1C  CBG: No results for input(s): GLUCAP in the last 168 hours.  Review of Systems:   See HPI  Past Medical History:  She,  has a past medical history of Anemia, Anxiety, Depression, GERD (gastroesophageal reflux disease), Headache, Hypertension, and Meningioma (HCC).   Surgical History:   Past Surgical History:  Procedure Laterality Date   CRANIOTOMY  05/26/2016   Procedure: SUBOCCIPITAL CRANIOTOMY WITH PLACEMENT OF VENTRICULAR CATHETER;  Surgeon: Rockey Peru, MD;  Location: [facility] OR;  Service: Neurosurgery;;  CRANIOTOMY - SUBOCCIPITAL   WISDOM TOOTH EXTRACTION       Social History:   reports that she has never smoked. She has never used smokeless tobacco. She reports current alcohol  use. She reports that she does not use drugs.   Family History:  Her family history includes Cirrhosis in her paternal grandfather; Diabetes in her father; Heart disease in her mother; Hypertension in her father and mother; Kidney disease in her father; Ovarian cancer in her maternal grandmother; Thyroid  cancer in her maternal aunt; Ulcers in her father. There is no history of Colon cancer, Esophageal cancer, Pancreatic cancer, or Stomach cancer.   Allergies Allergies  Allergen Reactions   Watermelon [Citrullus Vulgaris] Nausea And Vomiting     Home Medications  Prior to Admission medications   Medication Sig Start Date End Date Taking? Authorizing Provider  acetaminophen  (TYLENOL ) 325 MG tablet Take 325 mg by mouth every 6 (six) hours as needed for moderate pain (pain score 4-6).   Yes [provider]  aspirin-acetaminophen -caffeine (EXCEDRIN  MIGRAINE) 250-250-65 MG tablet Take 1 tablet by mouth every 6 (six) hours as needed for headache.   Yes [provider]  atenolol  (TENORMIN ) 50 MG tablet Take 1 tablet (50 mg total) by mouth daily. 05/21/23  Yes   BLACK COHOSH PO Take 700 mg by mouth daily.   Yes [provider]  buPROPion  (WELLBUTRIN  SR) 150 MG 12 hr tablet Take 1 tablet (150 mg total) by mouth daily. 10/16/23  Yes Geralene Kaiser, MD  escitalopram  (LEXAPRO ) 20 MG tablet Take 2 tablets (40 mg total) by mouth daily. 10/16/23  Yes Geralene Kaiser, MD  hydrochlorothiazide  (HYDRODIURIL ) 12.5 MG tablet Take 1 tablet (12.5 mg total) by mouth daily. 05/21/23  Yes   ibuprofen (ADVIL,MOTRIN) 200 MG tablet Take 200-400 mg by mouth 2 (two) times daily as needed for headache or moderate pain.   Yes [provider]  OVER THE COUNTER MEDICATION Take 2 capsules by mouth daily. Emma supplement   Yes [provider]  SODIUM FLUORIDE , DENTAL RINSE, (PREVIDENT ) 0.2 % SOLN USE AS AN ORAL RINSE, AS DIRECTED ON PACKAGE Patient taking differently: 1 Dose by Mouth Rinse route daily as needed. 12/13/23  Yes   chlorhexidine  (PERIOGARD ) 0.12 % solution RINSE MOUTH WITH (1 CAPFUL) FOR 30 SECONDS IN THE MORNING AND EVENING AFTER TOOTHBRUSHING. EXPECTORATE AFTER  RINSING, DO NOT SWALLOW. 05/27/22     Cholecalciferol  (VITAMIN D ) 50 MCG (2000 UT) CAPS Take 2 capsules (4,000 Units total) by mouth daily. Patient not taking: Reported on 02/05/2024 11/03/21     Semaglutide -Weight Management (WEGOVY ) 0.5 MG/0.5ML SOAJ Inject 0.5 mg into the skin once a week. Patient not taking: Reported on 02/05/2024 02/28/22     Semaglutide -Weight Management (WEGOVY ) 1 MG/0.5ML SOAJ Inject 1mg  under the skin once a week Patient not taking: Reported on 02/05/2024 03/24/22     Semaglutide -Weight Management (WEGOVY ) 1 MG/0.5ML SOAJ Inject 1 mg into the skin once a week. Patient not taking: Reported on 02/05/2024 09/05/22     Semaglutide -Weight Management (WEGOVY )  1.7 MG/0.75ML SOAJ Inject 1.7 mg into the skin once a week. Patient not taking: Reported on 02/05/2024 10/25/22     Semaglutide -Weight Management (WEGOVY ) 2.4 MG/0.75ML SOAJ Inject 2.4 mg into the skin once a week. Patient not taking: Reported on 02/05/2024 07/17/22     Semaglutide -Weight Management (WEGOVY ) 2.4 MG/0.75ML SOAJ Inject 2.4 mg into the skin once a week. Patient not taking: Reported on 02/05/2024 07/20/22     SODIUM FLUORIDE , DENTAL RINSE, (PREVIDENT ) 0.2 % SOLN Use as an oral rinse as directed on package Patient not taking: Reported on 02/05/2024 07/20/21     SODIUM FLUORIDE , DENTAL RINSE, (PREVIDENT ) 0.2 % SOLN USE AS AN ORAL RINSE, AS DIRECTED ON PACKAGE 04/21/22        Critical care time: 45 mins    Christian Mariza Bourget AGACNP-BC   [facility] 02/21/2024, 11:27 AM  Please see [email or web address] for pager details.  From 7A-7P if no response, please call [phone]. After hours, please call ELink [phone].

## 2024-02-22 ENCOUNTER — Inpatient Hospital Stay (HOSPITAL_COMMUNITY)

## 2024-02-22 DIAGNOSIS — K219 Gastro-esophageal reflux disease without esophagitis: Secondary | ICD-10-CM | POA: Diagnosis not present

## 2024-02-22 DIAGNOSIS — R519 Headache, unspecified: Secondary | ICD-10-CM

## 2024-02-22 DIAGNOSIS — D320 Benign neoplasm of cerebral meninges: Secondary | ICD-10-CM | POA: Diagnosis not present

## 2024-02-22 DIAGNOSIS — I10 Essential (primary) hypertension: Secondary | ICD-10-CM | POA: Diagnosis not present

## 2024-02-22 DIAGNOSIS — Z9889 Other specified postprocedural states: Secondary | ICD-10-CM | POA: Diagnosis not present

## 2024-02-22 MED ORDER — DEXAMETHASONE 4 MG PO TABS
4.0000 mg | ORAL_TABLET | Freq: Four times a day (QID) | ORAL | Status: AC
  Administered 2024-02-22 – 2024-02-23 (×4): 4 mg via ORAL
  Filled 2024-02-22 (×4): qty 1

## 2024-02-22 MED ORDER — GADOBUTROL 1 MMOL/ML IV SOLN
10.0000 mL | Freq: Once | INTRAVENOUS | Status: AC | PRN
  Administered 2024-02-22: 10 mL via INTRAVENOUS

## 2024-02-22 MED ORDER — DEXAMETHASONE 4 MG PO TABS
4.0000 mg | ORAL_TABLET | Freq: Three times a day (TID) | ORAL | Status: AC
  Administered 2024-02-23 – 2024-02-24 (×3): 4 mg via ORAL
  Filled 2024-02-22 (×3): qty 1

## 2024-02-22 MED ORDER — HYDRALAZINE HCL 25 MG PO TABS
25.0000 mg | ORAL_TABLET | Freq: Three times a day (TID) | ORAL | Status: DC
  Administered 2024-02-22 – 2024-02-27 (×11): 25 mg via ORAL
  Filled 2024-02-22 (×14): qty 1

## 2024-02-22 MED ORDER — DEXAMETHASONE 4 MG PO TABS
6.0000 mg | ORAL_TABLET | Freq: Four times a day (QID) | ORAL | Status: AC
  Administered 2024-02-22: 6 mg via ORAL
  Filled 2024-02-22: qty 2

## 2024-02-22 MED ORDER — HYDRALAZINE HCL 25 MG PO TABS: 25.0000 mg | ORAL_TABLET | Freq: Three times a day (TID) | ORAL | Status: DC

## 2024-02-22 MED ORDER — PANTOPRAZOLE SODIUM 40 MG PO TBEC
40.0000 mg | DELAYED_RELEASE_TABLET | Freq: Every day | ORAL | Status: DC
  Administered 2024-02-22 – 2024-02-24 (×3): 40 mg via ORAL
  Filled 2024-02-22 (×3): qty 1

## 2024-02-22 NOTE — Plan of Care (Signed)
 Weaned of cleviprex  today.  Art line and foley removed.  Patient up to chair.  Minor headache but otherwise neuro intact.

## 2024-02-22 NOTE — Progress Notes (Signed)
 Patient doing well. Gave BP meds early per NP to work on wean off cleviprex .  No current nausea.  Will work to advance diet as tolerated.

## 2024-02-22 NOTE — Progress Notes (Signed)
   NAME:  Alexa [name], MRN:  [id number], DOB:  [birth year], LOS: 1 ADMISSION DATE:  02/21/2024, CONSULTATION DATE:  7/23  REFERRING MD:  Gillie CHIEF COMPLAINT:  Recurrent Meningioma   History of Present Illness:  Patient is a 53 year old female with significant past medical history of hypertension, headache, GERD, anxiety, depression and recurrent meningioma who presents for scheduled resection of meningioma by Dr. Gillie.  PCCM consulted for assistance in management during ICU stay.  Pertinent  Medical History   Past Medical History:  Diagnosis Date   Anemia    Anxiety    Depression    GERD (gastroesophageal reflux disease)    Headache    Hypertension    Meningioma (HCC)      Significant Hospital Events: Including procedures, antibiotic start and stop dates in addition to other pertinent events   7/23 meningioma resection, PCCM consult  Interim History / Subjective:  Morning denies complaints.  Nausea is controlled.  Mild headache.  Objective    Blood pressure 122/71, pulse 87, temperature 99 F (37.2 C), temperature source Oral, resp. rate 16, height 5' 8.5 (1.74 m), weight 99.3 kg, last menstrual period 12/26/2023, SpO2 93%.        Intake/Output Summary (Last 24 hours) at 02/22/2024 0729 Last data filed at 02/22/2024 0600 Gross per 24 hour  Intake 3917.55 ml  Output 2655 ml  Net 1262.55 ml   Filed Weights   02/21/24 0604  Weight: 99.3 kg    Examination: General: Middle-age woman sleeping in bed comfortably HENT: Surgical incision dressed, no significant facial edema. lungs: Breathing comfortably on room air, CTAB. Cardiovascular: S1-S2, regular rate and rhythm Abdomen: Soft, nontender Neuro: Moving all extremities, symmetric strength, tracking.  Answering questions properly, normal speech. Extremities: No peripheral edema  H/H 11.8/35.9   Resolved problem list   Assessment and Plan  Left craniotomy with meningioma resection Recurrent meningioma  -01/01/24 MRI 5.3 x 3.9 x 4.1 cm  Headache post-op -con't ICU care; post-op care per NS -neuro checks, prevent hypertension -keppra  for seizure prophylaxis -avoid major electrolyte swings -acetaminophen , norco, morphine  PRN -dexamethasone  taper -Advance diet mobility as tolerated  Hypertension -con't PTA atenolol , hydrochlorothiazide   History of anxiety and depression -Continue PTA Wellbutrin  and Lexapro   GERD -PPI  Anemia - No current indication for transfusion - Monitor   Best Practice (right click and Reselect all SmartList Selections daily)-   Per Primary    Labs   CBC: Recent Labs  Lab 02/21/24 1229 02/21/24 1532  WBC  --  4.9  HGB 9.5* 11.8*  HCT 28.0* 35.9*  MCV  --  86.1  PLT  --  310    Basic Metabolic Panel: Recent Labs  Lab 02/21/24 1229 02/21/24 1532  NA 144  --   K 3.2*  --   CREATININE  --  0.76   GFR: Estimated Creatinine Clearance: 101.2 mL/min (by C-G formula based on SCr of 0.76 mg/dL). Recent Labs  Lab 02/21/24 1532  WBC 4.9     Critical care time:       Leita SHAUNNA Gaskins, DO 02/22/24 9:58 AM  Pulmonary & Critical Care  For contact information, see Amion. If no response to pager, please call PCCM consult pager. After hours, 7PM- 7AM, please call Elink.

## 2024-02-22 NOTE — Progress Notes (Signed)
 Patient ID: Alexa [name], female   DOB: [birth year], 53 y.o.   MRN: [id number] BP (!) 140/92   Pulse 77   Temp 98.6 F (37 C) (Oral)   Resp 18   Ht 5' 8.5 (1.74 m)   Wt 99.3 kg   LMP 12/26/2023 (Approximate)   SpO2 98%   BMI 32.81 kg/m  Alert and oriented x 4, speech is clear and fluent. Moving all extremities well Mri shows the cap of the tumor was left behind. My navigation and I disagreed. Thus I left tumor where navigation showed I was at the apex. We have spoken about going back to the OR to removed the remaining tumor. She will give this consideration and let me know. Ok for the floor. Wound dressing clean dry and intact.

## 2024-02-23 ENCOUNTER — Other Ambulatory Visit: Payer: Self-pay

## 2024-02-23 DIAGNOSIS — I10 Essential (primary) hypertension: Secondary | ICD-10-CM | POA: Diagnosis not present

## 2024-02-23 DIAGNOSIS — R519 Headache, unspecified: Secondary | ICD-10-CM | POA: Diagnosis not present

## 2024-02-23 DIAGNOSIS — Z9889 Other specified postprocedural states: Secondary | ICD-10-CM | POA: Diagnosis not present

## 2024-02-23 LAB — BASIC METABOLIC PANEL WITH GFR
Anion gap: 8 (ref 5–15)
BUN: 15 mg/dL (ref 6–20)
CO2: 25 mmol/L (ref 22–32)
Calcium: 9.2 mg/dL (ref 8.9–10.3)
Chloride: 108 mmol/L (ref 98–111)
Creatinine, Ser: 1.03 mg/dL — ABNORMAL HIGH (ref 0.44–1.00)
GFR, Estimated: >60 mL/min (ref >60)
Glucose, Bld: 123 mg/dL — ABNORMAL HIGH (ref 70–99)
Potassium: 3.8 mmol/L (ref 3.5–5.1)
Sodium: 141 mmol/L (ref 135–145)

## 2024-02-23 LAB — CBC
HCT: 31.4 % — ABNORMAL LOW (ref 36.0–46.0)
Hemoglobin: 10.6 g/dL — ABNORMAL LOW (ref 12.0–15.0)
MCH: 28.6 pg (ref 26.0–34.0)
MCHC: 33.8 g/dL (ref 30.0–36.0)
MCV: 84.9 fL (ref 80.0–100.0)
Platelets: 313 K/uL (ref 150–400)
RBC: 3.7 MIL/uL — ABNORMAL LOW (ref 3.87–5.11)
RDW: 12.5 % (ref 11.5–15.5)
WBC: 14.9 K/uL — ABNORMAL HIGH (ref 4.0–10.5)
nRBC: 0 % (ref 0.0–0.2)

## 2024-02-23 LAB — PHOSPHORUS: Phosphorus: 1.9 mg/dL — ABNORMAL LOW (ref 2.5–4.6)

## 2024-02-23 LAB — MAGNESIUM: Magnesium: 2.2 mg/dL (ref 1.7–2.4)

## 2024-02-23 MED ORDER — LEVETIRACETAM 500 MG PO TABS
500.0000 mg | ORAL_TABLET | Freq: Two times a day (BID) | ORAL | Status: DC
  Administered 2024-02-23 – 2024-02-25 (×4): 500 mg via ORAL
  Filled 2024-02-23 (×4): qty 1

## 2024-02-23 MED ORDER — POTASSIUM & SODIUM PHOSPHATES 280-160-250 MG PO PACK
1.0000 | PACK | Freq: Three times a day (TID) | ORAL | Status: AC
  Administered 2024-02-23 (×3): 1 via ORAL
  Filled 2024-02-23 (×3): qty 1

## 2024-02-23 NOTE — Progress Notes (Signed)
 NAME:  Alexa [name], MRN:  [id number], DOB:  [birth year], LOS: 2 ADMISSION DATE:  02/21/2024, CONSULTATION DATE:  7/23  REFERRING MD:  Gillie CHIEF COMPLAINT:  Recurrent Meningioma   History of Present Illness:  Patient is a 53 year old female with significant past medical history of hypertension, headache, GERD, anxiety, depression and recurrent meningioma who presents for scheduled resection of meningioma by Dr. Gillie.  PCCM consulted for assistance in management during ICU stay.  Pertinent  Medical History   Past Medical History:  Diagnosis Date   Anemia    Anxiety    Depression    GERD (gastroesophageal reflux disease)    Headache    Hypertension    Meningioma (HCC)      Significant Hospital Events: Including procedures, antibiotic start and stop dates in addition to other pertinent events   7/23 meningioma resection, PCCM consult  Interim History / Subjective:    Headache is improved Off Cleviprex   Objective    Blood pressure 133/87, pulse 70, temperature (!) 97.4 F (36.3 C), temperature source Oral, resp. rate 17, height 5' 8.5 (1.74 m), weight 99.3 kg, last menstrual period 12/26/2023, SpO2 96%.        Intake/Output Summary (Last 24 hours) at 02/23/2024 0851 Last data filed at 02/23/2024 0731 Gross per 24 hour  Intake 1891.28 ml  Output 2050 ml  Net -158.72 ml   Filed Weights   02/21/24 0604  Weight: 99.3 kg    Examination: General: Middle-age woman sitting up in bed, no distress HENT: Surgical incision dressed, no significant facial edema. lungs on room air, no accessory muscle use, clear breath sounds bilateral Cardiovascular: S1-S2, regular rate and rhythm Abdomen: Soft, nontender Neuro: Normal strength all extremities, normal speech Extremities: No peripheral edema  Labs show mild hypophosphatemia, leukocytosis   Resolved problem list   Assessment and Plan  Left craniotomy with meningioma resection Recurrent meningioma -01/01/24 MRI  5.3 x 3.9 x 4.1 cm  Headache post-op - post-op care per NS, repeat surgery planned -neuro checks, prevent hypertension -keppra  for seizure prophylaxis -acetaminophen , norco, morphine  PRN -dexamethasone  taper -Advance diet mobility as tolerated  Hypertension -con't PTA atenolol , hydrochlorothiazide  - Hydralazine  was added during this admission if blood pressure improved can DC prior to discharge  History of anxiety and depression -Continue PTA Wellbutrin  and Lexapro   GERD -PPI  Anemia - No current indication for transfusion - Monitor  Hypophosphatemia can be repleted Okay to transfer to floor, PCCM will be available as needed  Best Practice (right click and Reselect all SmartList Selections daily)-   Per Primary    Labs   CBC: Recent Labs  Lab 02/21/24 1229 02/21/24 1532 02/23/24 0639  WBC  --  4.9 14.9*  HGB 9.5* 11.8* 10.6*  HCT 28.0* 35.9* 31.4*  MCV  --  86.1 84.9  PLT  --  310 313    Basic Metabolic Panel: Recent Labs  Lab 02/21/24 1229 02/21/24 1532 02/23/24 0639  NA 144  --  141  K 3.2*  --  3.8  CL  --   --  108  CO2  --   --  25  GLUCOSE  --   --  123*  BUN  --   --  15  CREATININE  --  0.76 1.03*  CALCIUM  --   --  9.2  MG  --   --  2.2  PHOS  --   --  1.9*   GFR: Estimated Creatinine Clearance: 78.6 mL/min (A) (by C-G  formula based on SCr of 1.03 mg/dL (H)). Recent Labs  Lab 02/21/24 1532 02/23/24 0639  WBC 4.9 14.9*        Cornella Emmer V. Jude  02/23/24 8:51 AM [facility]  For contact information, see Amion. If no response to pager, please call PCCM consult pager. After hours, 7PM- 7AM, please call Elink.

## 2024-02-23 NOTE — Plan of Care (Signed)
 Patient doing well today.  Ambulated in room. No nausea or vomiting.  Progressive orders in but no current bed availability.  Vitals and BP remain good today.

## 2024-02-23 NOTE — Progress Notes (Signed)
 Patient ID: Alexa [name], female   DOB: 1970-12-09, 53 y.o.   MRN: [id number] BP 119/68 (BP Location: Left Arm)   Pulse 76   Temp 98.6 F (37 C) (Oral)   Resp 15   Ht 5' 8.5 (1.74 m)   Wt 99.3 kg   LMP 12/26/2023 (Approximate)   SpO2 97%   BMI 32.81 kg/m  Alert and oriented x 4, speech is clear and fluent Moving all extremities Wound dressing clean and dry transfer

## 2024-02-24 MED ORDER — CHLORHEXIDINE GLUCONATE CLOTH 2 % EX PADS
6.0000 | MEDICATED_PAD | Freq: Every day | CUTANEOUS | Status: DC
  Administered 2024-02-24 – 2024-02-28 (×5): 6 via TOPICAL

## 2024-02-24 NOTE — Progress Notes (Signed)
 Patient ID: Alexa [name], female   DOB: 07-Jan-1971, 53 y.o.   MRN: [id number] BP 114/69   Pulse 67   Temp 98 F (36.7 C) (Oral)   Resp 16   Ht 5' 8.5 (1.74 m)   Wt 99.3 kg   LMP 12/26/2023 (Approximate)   SpO2 97%   BMI 32.81 kg/m  Comfortable. Plan on surgery for stage ll Monday. Wound dressing is dry Moving all extremities well

## 2024-02-25 LAB — BASIC METABOLIC PANEL WITH GFR
Anion gap: 10 (ref 5–15)
BUN: 9 mg/dL (ref 6–20)
CO2: 29 mmol/L (ref 22–32)
Calcium: 8.8 mg/dL — ABNORMAL LOW (ref 8.9–10.3)
Chloride: 102 mmol/L (ref 98–111)
Creatinine, Ser: 0.71 mg/dL (ref 0.44–1.00)
GFR, Estimated: >60 mL/min (ref >60)
Glucose, Bld: 102 mg/dL — ABNORMAL HIGH (ref 70–99)
Potassium: 3.1 mmol/L — ABNORMAL LOW (ref 3.5–5.1)
Sodium: 141 mmol/L (ref 135–145)

## 2024-02-25 LAB — PHOSPHORUS: Phosphorus: 2.7 mg/dL (ref 2.5–4.6)

## 2024-02-25 MED ORDER — POTASSIUM CHLORIDE CRYS ER 20 MEQ PO TBCR
60.0000 meq | EXTENDED_RELEASE_TABLET | Freq: Once | ORAL | Status: AC
  Administered 2024-02-25: 60 meq via ORAL
  Filled 2024-02-25: qty 3

## 2024-02-25 NOTE — Progress Notes (Signed)
 Patient ID: Alexa [name], female   DOB: 06-12-1971, 53 y.o.   MRN: [id number] BP 114/77 (BP Location: Left Arm)   Pulse 61   Temp 97.8 F (36.6 C) (Axillary)   Resp 14   Ht 5' 8.5 (1.74 m)   Wt 99.3 kg   LMP 12/26/2023 (Approximate)   SpO2 99%   BMI 32.81 kg/m  Alert and oriented x 4, speech is clear and fluent Moving all extremities well Dressing in place OR hopefully tomorrow

## 2024-02-25 NOTE — Plan of Care (Signed)
  Problem: Clinical Measurements: Goal: Usual level of consciousness will be regained or maintained. Outcome: Progressing   Problem: Education: Goal: Knowledge of the prescribed therapeutic regimen will improve Outcome: Progressing   Problem: Pain Managment: Goal: General experience of comfort will improve and/or be controlled Outcome: Progressing   Problem: Coping: Goal: Level of anxiety will decrease Outcome: Progressing   Problem: Nutrition: Goal: Adequate nutrition will be maintained Outcome: Progressing   Problem: Activity: Goal: Risk for activity intolerance will decrease Outcome: Progressing   Problem: Clinical Measurements: Goal: Ability to maintain clinical measurements within normal limits will improve Outcome: Progressing Goal: Will remain free from infection Outcome: Progressing   Problem: Health Behavior/Discharge Planning: Goal: Ability to manage health-related needs will improve Outcome: Progressing   Problem: Education: Goal: Knowledge of General Education information will improve Description: Including pain rating scale, medication(s)/side effects and non-pharmacologic comfort measures Outcome: Progressing

## 2024-02-26 ENCOUNTER — Other Ambulatory Visit: Payer: Self-pay | Admitting: Neurosurgery

## 2024-02-26 ENCOUNTER — Other Ambulatory Visit: Payer: Self-pay

## 2024-02-26 ENCOUNTER — Inpatient Hospital Stay (HOSPITAL_COMMUNITY): Admitting: Anesthesiology

## 2024-02-26 ENCOUNTER — Inpatient Hospital Stay (HOSPITAL_COMMUNITY)

## 2024-02-26 ENCOUNTER — Encounter (HOSPITAL_COMMUNITY)
Admission: RE | Disposition: A | Discharge status: Home / Self Care | Payer: Self-pay | Source: Home / Self Care | Attending: Neurosurgery

## 2024-02-26 ENCOUNTER — Encounter (HOSPITAL_COMMUNITY): Payer: Self-pay | Admitting: Neurosurgery

## 2024-02-26 DIAGNOSIS — D496 Neoplasm of unspecified behavior of brain: Secondary | ICD-10-CM

## 2024-02-26 DIAGNOSIS — F418 Other specified anxiety disorders: Secondary | ICD-10-CM | POA: Diagnosis not present

## 2024-02-26 DIAGNOSIS — D649 Anemia, unspecified: Secondary | ICD-10-CM | POA: Diagnosis not present

## 2024-02-26 DIAGNOSIS — Z9889 Other specified postprocedural states: Secondary | ICD-10-CM | POA: Diagnosis not present

## 2024-02-26 DIAGNOSIS — I10 Essential (primary) hypertension: Secondary | ICD-10-CM

## 2024-02-26 HISTORY — PX: CRANIOTOMY: SHX93

## 2024-02-26 LAB — POCT I-STAT 7, (LYTES, BLD GAS, ICA,H+H)
Acid-Base Excess: 0 mmol/L (ref 0.0–2.0)
Bicarbonate: 22.8 mmol/L (ref 20.0–28.0)
Calcium, Ion: 1.13 mmol/L — ABNORMAL LOW (ref 1.15–1.40)
HCT: 26 % — ABNORMAL LOW (ref 36.0–46.0)
Hemoglobin: 8.8 g/dL — ABNORMAL LOW (ref 12.0–15.0)
O2 Saturation: 99 %
Patient temperature: 36.7
Potassium: 3.8 mmol/L (ref 3.5–5.1)
Sodium: 138 mmol/L (ref 135–145)
TCO2: 24 mmol/L (ref 22–32)
pCO2 arterial: 28.1 mmHg — ABNORMAL LOW (ref 32–48)
pH, Arterial: 7.517 — ABNORMAL HIGH (ref 7.35–7.45)
pO2, Arterial: 127 mmHg — ABNORMAL HIGH (ref 83–108)

## 2024-02-26 LAB — BASIC METABOLIC PANEL WITH GFR
Anion gap: 9 (ref 5–15)
BUN: 8 mg/dL (ref 6–20)
CO2: 28 mmol/L (ref 22–32)
Calcium: 9 mg/dL (ref 8.9–10.3)
Chloride: 101 mmol/L (ref 98–111)
Creatinine, Ser: 0.74 mg/dL (ref 0.44–1.00)
GFR, Estimated: >60 mL/min (ref >60)
Glucose, Bld: 94 mg/dL (ref 70–99)
Potassium: 3.8 mmol/L (ref 3.5–5.1)
Sodium: 138 mmol/L (ref 135–145)

## 2024-02-26 LAB — MAGNESIUM: Magnesium: 1.8 mg/dL (ref 1.7–2.4)

## 2024-02-26 SURGERY — CRANIOTOMY TUMOR EXCISION
Anesthesia: General | Laterality: Left

## 2024-02-26 MED ORDER — PHENYLEPHRINE 80 MCG/ML (10ML) SYRINGE FOR IV PUSH (FOR BLOOD PRESSURE SUPPORT)
PREFILLED_SYRINGE | INTRAVENOUS | Status: DC | PRN
  Administered 2024-02-26 (×2): 80 ug via INTRAVENOUS
  Administered 2024-02-26 (×3): 40 ug via INTRAVENOUS

## 2024-02-26 MED ORDER — CLEVIDIPINE BUTYRATE 0.5 MG/ML IV EMUL
INTRAVENOUS | Status: AC
  Filled 2024-02-26: qty 50

## 2024-02-26 MED ORDER — SODIUM CHLORIDE (PF) 0.9 % IJ SOLN
INTRAMUSCULAR | Status: AC
  Filled 2024-02-26: qty 10

## 2024-02-26 MED ORDER — OXYCODONE HCL 5 MG PO TABS: 5.0000 mg | ORAL_TABLET | Freq: Once | ORAL | Status: DC | PRN

## 2024-02-26 MED ORDER — FENTANYL CITRATE (PF) 100 MCG/2ML IJ SOLN: 25.0000 ug | INTRAMUSCULAR | Status: DC | PRN

## 2024-02-26 MED ORDER — GADOBUTROL 1 MMOL/ML IV SOLN
7.0000 mL | Freq: Once | INTRAVENOUS | Status: AC | PRN
  Administered 2024-02-26: 7 mL via INTRAVENOUS

## 2024-02-26 MED ORDER — FENTANYL CITRATE (PF) 250 MCG/5ML IJ SOLN
INTRAMUSCULAR | Status: DC | PRN
  Administered 2024-02-26 (×2): 50 ug via INTRAVENOUS
  Administered 2024-02-26: 100 ug via INTRAVENOUS
  Administered 2024-02-26: 50 ug via INTRAVENOUS
  Administered 2024-02-26: 200 ug via INTRAVENOUS
  Administered 2024-02-26: 50 ug via INTRAVENOUS

## 2024-02-26 MED ORDER — ALBUMIN HUMAN 5 % IV SOLN: INTRAVENOUS | Status: DC | PRN

## 2024-02-26 MED ORDER — THROMBIN 5000 UNITS EX SOLR
OROMUCOSAL | Status: DC | PRN
  Administered 2024-02-26: 5 mL via TOPICAL

## 2024-02-26 MED ORDER — CEFAZOLIN SODIUM-DEXTROSE 2-3 GM-%(50ML) IV SOLR
INTRAVENOUS | Status: DC | PRN
Start: 2024-02-26 — End: 2024-02-26
  Administered 2024-02-26 (×2): 2 g via INTRAVENOUS

## 2024-02-26 MED ORDER — MORPHINE SULFATE (PF) 2 MG/ML IV SOLN
1.0000 mg | INTRAVENOUS | Status: DC | PRN
  Administered 2024-02-27 – 2024-02-29 (×6): 2 mg via INTRAVENOUS
  Filled 2024-02-26 (×6): qty 1

## 2024-02-26 MED ORDER — PHENYLEPHRINE HCL-NACL 20-0.9 MG/250ML-% IV SOLN
INTRAVENOUS | Status: DC | PRN
  Administered 2024-02-26: 30 ug/min via INTRAVENOUS

## 2024-02-26 MED ORDER — BACITRACIN ZINC 500 UNIT/GM EX OINT
TOPICAL_OINTMENT | CUTANEOUS | Status: DC | PRN
  Administered 2024-02-26: 1 via TOPICAL

## 2024-02-26 MED ORDER — PROPOFOL 10 MG/ML IV BOLUS
INTRAVENOUS | Status: AC
  Filled 2024-02-26: qty 20

## 2024-02-26 MED ORDER — SODIUM CHLORIDE 0.9 % IV SOLN
INTRAVENOUS | Status: DC | PRN
  Administered 2024-02-26: .2 ug/kg/min via INTRAVENOUS

## 2024-02-26 MED ORDER — CEFAZOLIN SODIUM 1 G IJ SOLR
INTRAMUSCULAR | Status: AC
Start: 2024-02-26 — End: 2024-02-26
  Filled 2024-02-26: qty 40

## 2024-02-26 MED ORDER — SODIUM CHLORIDE 0.9 % IV SOLN: INTRAVENOUS | Status: DC | PRN

## 2024-02-26 MED ORDER — HYDRALAZINE HCL 20 MG/ML IJ SOLN
10.0000 mg | INTRAMUSCULAR | Status: DC | PRN
  Administered 2024-02-27: 20 mg via INTRAVENOUS
  Filled 2024-02-26: qty 1

## 2024-02-26 MED ORDER — CALCIUM GLUCONATE-NACL 1-0.675 GM/50ML-% IV SOLN
1.0000 g | Freq: Once | INTRAVENOUS | Status: AC
  Administered 2024-02-27: 1000 mg via INTRAVENOUS
  Filled 2024-02-26: qty 50

## 2024-02-26 MED ORDER — LIDOCAINE 2% (20 MG/ML) 5 ML SYRINGE
INTRAMUSCULAR | Status: DC | PRN
  Administered 2024-02-26: 20 mg via INTRAVENOUS

## 2024-02-26 MED ORDER — DEXAMETHASONE 4 MG PO TABS
4.0000 mg | ORAL_TABLET | Freq: Four times a day (QID) | ORAL | Status: AC
  Administered 2024-02-28 (×4): 4 mg via ORAL
  Filled 2024-02-26 (×4): qty 1

## 2024-02-26 MED ORDER — REMIFENTANIL HCL 2 MG IV SOLR
INTRAVENOUS | Status: AC
Start: 2024-02-26 — End: 2024-02-26
  Filled 2024-02-26: qty 2000

## 2024-02-26 MED ORDER — ONDANSETRON HCL 4 MG/2ML IJ SOLN
INTRAMUSCULAR | Status: DC | PRN
  Administered 2024-02-26: 4 mg via INTRAVENOUS

## 2024-02-26 MED ORDER — HEPARIN SODIUM (PORCINE) 5000 UNIT/ML IJ SOLN
5000.0000 [IU] | Freq: Three times a day (TID) | INTRAMUSCULAR | Status: DC
  Administered 2024-02-27 – 2024-02-28 (×5): 5000 [IU] via SUBCUTANEOUS
  Filled 2024-02-26 (×7): qty 1

## 2024-02-26 MED ORDER — CHLORHEXIDINE GLUCONATE 0.12 % MT SOLN: 15.0000 mL | Freq: Once | OROMUCOSAL | Status: DC

## 2024-02-26 MED ORDER — DEXAMETHASONE SODIUM PHOSPHATE 10 MG/ML IJ SOLN
INTRAMUSCULAR | Status: DC | PRN
  Administered 2024-02-26: 10 mg via INTRAVENOUS

## 2024-02-26 MED ORDER — PANTOPRAZOLE SODIUM 40 MG PO TBEC
40.0000 mg | DELAYED_RELEASE_TABLET | Freq: Every day | ORAL | Status: DC
  Administered 2024-02-27 – 2024-02-29 (×3): 40 mg via ORAL
  Filled 2024-02-26 (×3): qty 1

## 2024-02-26 MED ORDER — POTASSIUM CHLORIDE IN NACL 20-0.9 MEQ/L-% IV SOLN
INTRAVENOUS | Status: DC
  Filled 2024-02-26 (×3): qty 1000

## 2024-02-26 MED ORDER — FENTANYL CITRATE (PF) 250 MCG/5ML IJ SOLN
INTRAMUSCULAR | Status: AC
  Filled 2024-02-26: qty 5

## 2024-02-26 MED ORDER — DEXAMETHASONE 4 MG PO TABS
4.0000 mg | ORAL_TABLET | Freq: Three times a day (TID) | ORAL | Status: DC
  Administered 2024-02-28 – 2024-02-29 (×3): 4 mg via ORAL
  Filled 2024-02-26 (×3): qty 1

## 2024-02-26 MED ORDER — SODIUM CHLORIDE 0.9 % IV SOLN: INTRAVENOUS | Status: DC

## 2024-02-26 MED ORDER — MIDAZOLAM HCL 2 MG/2ML IJ SOLN: 0.5000 mg | Freq: Once | INTRAMUSCULAR | Status: DC | PRN

## 2024-02-26 MED ORDER — NALOXONE HCL 0.4 MG/ML IJ SOLN
INTRAMUSCULAR | Status: AC
  Filled 2024-02-26: qty 1

## 2024-02-26 MED ORDER — HEMOSTATIC AGENTS (NO CHARGE) OPTIME
TOPICAL | Status: DC | PRN
Start: 2024-02-26 — End: 2024-02-26
  Administered 2024-02-26: 1 via TOPICAL

## 2024-02-26 MED ORDER — DEXAMETHASONE 4 MG PO TABS
6.0000 mg | ORAL_TABLET | Freq: Four times a day (QID) | ORAL | Status: AC
  Administered 2024-02-27 (×4): 6 mg via ORAL
  Filled 2024-02-26 (×4): qty 2

## 2024-02-26 MED ORDER — THROMBIN 20000 UNITS EX SOLR
CUTANEOUS | Status: DC | PRN
  Administered 2024-02-26: 20 mL via TOPICAL

## 2024-02-26 MED ORDER — LEVETIRACETAM 500 MG/5ML IV SOLN
INTRAVENOUS | Status: AC
  Filled 2024-02-26: qty 10

## 2024-02-26 MED ORDER — ORAL CARE MOUTH RINSE: 15.0000 mL | Freq: Once | OROMUCOSAL | Status: DC

## 2024-02-26 MED ORDER — PROPOFOL 500 MG/50ML IV EMUL
INTRAVENOUS | Status: DC | PRN
  Administered 2024-02-26: 75 ug/kg/min via INTRAVENOUS

## 2024-02-26 MED ORDER — MEPERIDINE HCL 25 MG/ML IJ SOLN: 6.2500 mg | INTRAMUSCULAR | Status: DC | PRN

## 2024-02-26 MED ORDER — 0.9 % SODIUM CHLORIDE (POUR BTL) OPTIME
TOPICAL | Status: DC | PRN
Start: 2024-02-26 — End: 2024-02-26
  Administered 2024-02-26: 3000 mL

## 2024-02-26 MED ORDER — SUGAMMADEX SODIUM 200 MG/2ML IV SOLN
INTRAVENOUS | Status: DC | PRN
  Administered 2024-02-26 (×2): 200 mg via INTRAVENOUS

## 2024-02-26 MED ORDER — FENTANYL CITRATE (PF) 250 MCG/5ML IJ SOLN
INTRAMUSCULAR | Status: AC
Start: 2024-02-26 — End: 2024-02-26
  Filled 2024-02-26: qty 5

## 2024-02-26 MED ORDER — ROCURONIUM BROMIDE 10 MG/ML (PF) SYRINGE
PREFILLED_SYRINGE | INTRAVENOUS | Status: AC
  Filled 2024-02-26: qty 10

## 2024-02-26 MED ORDER — SCOPOLAMINE 1 MG/3DAYS TD PT72
MEDICATED_PATCH | TRANSDERMAL | Status: DC | PRN
  Administered 2024-02-26: 1 via TRANSDERMAL

## 2024-02-26 MED ORDER — PROPOFOL 10 MG/ML IV BOLUS
INTRAVENOUS | Status: DC | PRN
  Administered 2024-02-26: 50 mg via INTRAVENOUS
  Administered 2024-02-26: 120 mg via INTRAVENOUS
  Administered 2024-02-26: 30 mg via INTRAVENOUS
  Administered 2024-02-26: 40 mg via INTRAVENOUS
  Administered 2024-02-26 (×2): 50 mg via INTRAVENOUS
  Administered 2024-02-26: 80 mg via INTRAVENOUS

## 2024-02-26 MED ORDER — BACITRACIN ZINC 500 UNIT/GM EX OINT
TOPICAL_OINTMENT | CUTANEOUS | Status: AC
  Filled 2024-02-26: qty 28.35

## 2024-02-26 MED ORDER — LIDOCAINE-EPINEPHRINE 0.5 %-1:200000 IJ SOLN
INTRAMUSCULAR | Status: AC
  Filled 2024-02-26: qty 50

## 2024-02-26 MED ORDER — ROCURONIUM BROMIDE 10 MG/ML (PF) SYRINGE
PREFILLED_SYRINGE | INTRAVENOUS | Status: DC | PRN
  Administered 2024-02-26: 20 mg via INTRAVENOUS
  Administered 2024-02-26: 40 mg via INTRAVENOUS
  Administered 2024-02-26: 10 mg via INTRAVENOUS
  Administered 2024-02-26: 20 mg via INTRAVENOUS
  Administered 2024-02-26: 60 mg via INTRAVENOUS
  Administered 2024-02-26: 20 mg via INTRAVENOUS

## 2024-02-26 MED ORDER — OXYCODONE HCL 5 MG/5ML PO SOLN: 5.0000 mg | Freq: Once | ORAL | Status: DC | PRN

## 2024-02-26 MED ORDER — THROMBIN 5000 UNITS EX KIT
PACK | CUTANEOUS | Status: AC
  Filled 2024-02-26: qty 1

## 2024-02-26 MED ORDER — THROMBIN 20000 UNITS EX SOLR
CUTANEOUS | Status: AC
  Filled 2024-02-26: qty 20000

## 2024-02-26 MED ORDER — NITROGLYCERIN IN D5W 200-5 MCG/ML-% IV SOLN
INTRAVENOUS | Status: DC | PRN
Start: 2024-02-26 — End: 2024-02-26
  Administered 2024-02-26: .04 mg via INTRAVENOUS

## 2024-02-26 SURGICAL SUPPLY — 71 items
BAG COUNTER SPONGE SURGICOUNT (BAG) ×1 IMPLANT
BAND RUBBER #18 3X1/16 STRL (MISCELLANEOUS) ×2 IMPLANT
BENZOIN TINCTURE PRP APPL 2/3 (GAUZE/BANDAGES/DRESSINGS) IMPLANT
BLADE CLIPPER SURG (BLADE) ×1 IMPLANT
BLADE SAW GIGLI 16 STRL (MISCELLANEOUS) IMPLANT
BLADE SURG 15 STRL LF DISP TIS (BLADE) IMPLANT
BLADE ULTRA TIP 2M (BLADE) IMPLANT
BNDG GAUZE DERMACEA FLUFF 4 (GAUZE/BANDAGES/DRESSINGS) IMPLANT
BUR ACORN 6.0 PRECISION (BURR) ×1 IMPLANT
BUR MATCHSTICK NEURO 3.0 LAGG (BURR) IMPLANT
BUR SPIRAL ROUTER 2.3 (BUR) IMPLANT
CANISTER SUCTION 3000ML PPV (SUCTIONS) ×1 IMPLANT
CASSETTE SUCT IRRIG SONOPET IQ (MISCELLANEOUS) IMPLANT
CATH VENTRIC 35X38 W/TROCAR LG (CATHETERS) IMPLANT
CLIP TI MEDIUM 6 (CLIP) IMPLANT
CNTNR URN SCR LID CUP LEK RST (MISCELLANEOUS) ×1 IMPLANT
DRAIN SUBARACHNOID (WOUND CARE) IMPLANT
DRAPE CAMERA VIDEO/LASER (DRAPES) IMPLANT
DRAPE MICROSCOPE SLANT 54X150 (MISCELLANEOUS) IMPLANT
DRAPE NEUROLOGICAL W/INCISE (DRAPES) ×1 IMPLANT
DRAPE SURG 17X23 STRL (DRAPES) IMPLANT
DRAPE SURG ORHT 6 SPLT 77X108 (DRAPES) IMPLANT
DRAPE WARM FLUID 44X44 (DRAPES) ×1 IMPLANT
DRSG AQUACEL AG ADV 3.5X10 (GAUZE/BANDAGES/DRESSINGS) IMPLANT
DURAPREP 6ML APPLICATOR 50/CS (WOUND CARE) ×1 IMPLANT
ELECTRODE REM PT RTRN 9FT ADLT (ELECTROSURGICAL) ×1 IMPLANT
EVACUATOR 1/8 PVC DRAIN (DRAIN) IMPLANT
EVACUATOR SILICONE 100CC (DRAIN) IMPLANT
FORCEPS BIPOLAR SPETZLER 8 1.0 (NEUROSURGERY SUPPLIES) IMPLANT
GAUZE 4X4 16PLY ~~LOC~~+RFID DBL (SPONGE) IMPLANT
GAUZE SPONGE 4X4 12PLY STRL (GAUZE/BANDAGES/DRESSINGS) ×1 IMPLANT
GLOVE BIO SURGEON STRL SZ7 (GLOVE) IMPLANT
GLOVE ECLIPSE 6.5 STRL STRAW (GLOVE) ×1 IMPLANT
GLOVE EXAM NITRILE XL STR (GLOVE) IMPLANT
GOWN STRL REUS W/ TWL LRG LVL3 (GOWN DISPOSABLE) ×2 IMPLANT
GOWN STRL REUS W/ TWL XL LVL3 (GOWN DISPOSABLE) IMPLANT
GOWN STRL REUS W/TWL 2XL LVL3 (GOWN DISPOSABLE) IMPLANT
HEMOSTAT POWDER SURGIFOAM 1G (HEMOSTASIS) IMPLANT
HEMOSTAT SURGICEL 2X14 (HEMOSTASIS) IMPLANT
HOOK DURA 1/2IN (MISCELLANEOUS) ×1 IMPLANT
KIT BASIN OR (CUSTOM PROCEDURE TRAY) ×1 IMPLANT
KIT DRAIN CSF ACCUDRAIN (MISCELLANEOUS) IMPLANT
KIT TURNOVER KIT B (KITS) ×1 IMPLANT
NDL HYPO 25X1 1.5 SAFETY (NEEDLE) ×1 IMPLANT
NDL SPNL 18GX3.5 QUINCKE PK (NEEDLE) IMPLANT
NEEDLE HYPO 25X1 1.5 SAFETY (NEEDLE) ×1 IMPLANT
NEEDLE SPNL 18GX3.5 QUINCKE PK (NEEDLE) IMPLANT
NS IRRIG 1000ML POUR BTL (IV SOLUTION) ×3 IMPLANT
PACK CRANIOTOMY CUSTOM (CUSTOM PROCEDURE TRAY) ×1 IMPLANT
PATTIES SURGICAL .5 X3 (DISPOSABLE) IMPLANT
SCREW UNIII AXS SD 1.5X4 (Screw) IMPLANT
SEALANT ADHERUS EXTEND TIP (MISCELLANEOUS) IMPLANT
SPECIMEN JAR SMALL (MISCELLANEOUS) IMPLANT
SPIKE FLUID TRANSFER (MISCELLANEOUS) ×1 IMPLANT
SPONGE NEURO XRAY DETECT 1X3 (DISPOSABLE) IMPLANT
SPONGE SURGIFOAM ABS GEL 100 (HEMOSTASIS) ×1 IMPLANT
STAPLER SKIN PROX 35W (STAPLE) ×1 IMPLANT
SUT 3-0 BLK 1X30 PSL (SUTURE) IMPLANT
SUT ETHILON 3 0 PS 1 (SUTURE) IMPLANT
SUT NURALON 4 0 TR CR/8 (SUTURE) ×3 IMPLANT
SUT SILK 0 TIES 10X30 (SUTURE) IMPLANT
SUT STEEL 0 18XMFL TIE 17 (SUTURE) IMPLANT
SUT VIC AB 2-0 CT2 18 VCP726D (SUTURE) ×2 IMPLANT
TIP TISSUE SONOPET IQ STD 12 (TIP) IMPLANT
TOWEL GREEN STERILE (TOWEL DISPOSABLE) ×1 IMPLANT
TOWEL GREEN STERILE FF (TOWEL DISPOSABLE) ×1 IMPLANT
TRAY FOLEY MTR SLVR 16FR STAT (SET/KITS/TRAYS/PACK) ×1 IMPLANT
TUBE CONNECTING 12X1/4 (SUCTIONS) ×1 IMPLANT
TUBING FEATHERFLOW (TUBING) IMPLANT
UNDERPAD 30X36 HEAVY ABSORB (UNDERPADS AND DIAPERS) ×1 IMPLANT
WATER STERILE IRR 1000ML POUR (IV SOLUTION) ×1 IMPLANT

## 2024-02-26 NOTE — Progress Notes (Signed)
 Patient ID: Alexa [name], female   DOB: 1971/07/24, 53 y.o.   MRN: [id number] BP 134/86   Pulse 62   Temp 97.8 F (36.6 C) (Oral)   Resp 18   Ht 5' 8.5 (1.74 m)   Wt 98.9 kg   LMP 12/26/2023 (Approximate)   SpO2 96%   BMI 32.66 kg/m  Alert and oriented x 4, speech is clear and fluent Moving all extremities well Perrl, full eom Tongue and uvula midline OR for stage ll resection meningioma Brain damage, paralysis, stroke, coma, death Need for further surgery She understands and wishes to proceed

## 2024-02-26 NOTE — Consult Note (Signed)
 NAME:  Alexa [name], MRN:  [id number], DOB:  June 16, 1971, LOS: 5 ADMISSION DATE:  02/21/2024, CONSULTATION DATE:  7//28 REFERRING MD:  Gillie LOOSE CHIEF COMPLAINT:  s/p craniotomy   History of Present Illness:  53 year old female with past medical history of anxiety/depression, GERD, hypertension, meningioma who presents 5 days ago for meningioma removal. Initially admitted 02/21/24 with recurrent meningioma in supratentorial and infratentorial spaces. She was admitted to neuro ICU post-operatively. Follow-up MRI showed cap of tumor left behind and after discussion with neurosurgery, elected to proceed with stage II resection 7/28.   Pertinent  Medical History  Anxiety, depression, GERD, HTN, recurrent meningioma   Significant Hospital Events: Including procedures, antibiotic start and stop dates in addition to other pertinent events   7/23: admit s/p meningioma removal. F/u MRI with tumor cap left.  7/28: stage II meningioma removal   Interim History / Subjective:  s/p stage II meningioma removal this afternoon.  Sleepy currently, no complaints. SBP 170s, goal < 150.  Objective   Blood pressure 134/86, pulse 62, temperature 97.8 F (36.6 C), temperature source Oral, resp. rate 18, height 5' 8.5 (1.74 m), weight 98.9 kg, last menstrual period 12/26/2023, SpO2 96%.        Intake/Output Summary (Last 24 hours) at 02/26/2024 1945 Last data filed at 02/26/2024 1846 Gross per 24 hour  Intake 2760 ml  Output 1018 ml  Net 1742 ml   Filed Weights   02/21/24 0604 02/26/24 1256  Weight: 99.3 kg 98.9 kg    Examination: General: Adult female, resting in bed, in NAD. Neuro: Sleepy post op but awakens to voice, answers basic questions, MAE's. HEENT: Scalp dressings clean and dry. [place]/AT. Sclerae anicteric. EOMI. Cardiovascular: RRR, no M/R/G.  Lungs: Respirations even and unlabored.  CTA bilaterally, No W/R/R. Abdomen: BS x 4, soft, NT/ND.  Musculoskeletal: No gross deformities,  no edema.    Assessment & Plan:   Recurrent meningioma s/p craniotomy with stage I resection 7/23 followed by stage II resection 7/28. - NSGY primary, appreciate management. - SBP goal < 150.  - Empiric Keppra . - Continue Decadron . - Post-op pain control per neurosurgery.   Hypocalcemia. - 1g Ca gluconate. - Follow BMP.  Anemia, exacerbated by acute blood loss intra-op. - Transfuse for Hgb < 7.  Hx Post-operative nausea and vomiting.  - Zofran  PRN, Compazine  PRN.  Hx hypertension. - SBP goal < 150. - Continue Hydralazine . - Continue PTA atenolol  and hydrochlorothiazide  for now.  Hx GERD. - PPI.  Hx Anxiety, Depression. - Continue PTA Wellbutrin , Lexapro .  Hypomagnesemia on AM labs 7/28. - Follow BMP, replete Mg 7/29 if still low.  Best Practice (right click and Reselect all SmartList Selections daily)   Diet/type: Regular consistency (see orders) DVT prophylaxis: prophylactic heparin   GI prophylaxis: PPI Lines: Arterial Line and yes and it is still needed Foley:  N/A Code Status:  full code Last date of multidisciplinary goals of care discussion [per primary]  Labs   CBC: Recent Labs  Lab 02/21/24 1229 02/21/24 1532 02/23/24 0639  WBC  --  4.9 14.9*  HGB 9.5* 11.8* 10.6*  HCT 28.0* 35.9* 31.4*  MCV  --  86.1 84.9  PLT  --  310 313    Basic Metabolic Panel: Recent Labs  Lab 02/21/24 1229 02/21/24 1532 02/23/24 0639 02/25/24 0618 02/26/24 0700  NA 144  --  141 141 138  K 3.2*  --  3.8 3.1* 3.8  CL  --   --  108 102 101  CO2  --   --  25 29 28   GLUCOSE  --   --  123* 102* 94  BUN  --   --  15 9 8   CREATININE  --  0.76 1.03* 0.71 0.74  CALCIUM   --   --  9.2 8.8* 9.0  MG  --   --  2.2  --  1.8  PHOS  --   --  1.9* 2.7  --    GFR: Estimated Creatinine Clearance: 100.9 mL/min (by C-G formula based on SCr of 0.74 mg/dL). Recent Labs  Lab 02/21/24 1532 02/23/24 0639  WBC 4.9 14.9*    Liver Function Tests: No results for input(s):  AST, ALT, ALKPHOS, BILITOT, PROT, ALBUMIN  in the last 168 hours. No results for input(s): LIPASE, AMYLASE in the last 168 hours. No results for input(s): AMMONIA in the last 168 hours.  ABG    Component Value Date/Time   PHART 7.370 02/21/2024 1229   PCO2ART 32.7 02/21/2024 1229   PO2ART 136 (H) 02/21/2024 1229   HCO3 19.4 (L) 02/21/2024 1229   TCO2 21 (L) 02/21/2024 1229   ACIDBASEDEF 6.0 (H) 02/21/2024 1229   O2SAT 99 02/21/2024 1229     Coagulation Profile: No results for input(s): INR, PROTIME in the last 168 hours.  Cardiac Enzymes: No results for input(s): CKTOTAL, CKMB, CKMBINDEX, TROPONINI in the last 168 hours.  HbA1C: No results found for: HGBA1C  CBG: No results for input(s): GLUCAP in the last 168 hours.  Review of Systems:   As above  Past Medical History:  She,  has a past medical history of Anemia, Anxiety, Depression, GERD (gastroesophageal reflux disease), Headache, Hypertension, Meningioma (HCC), and PONV (postoperative nausea and vomiting).   Surgical History:   Past Surgical History:  Procedure Laterality Date   APPLICATION OF CRANIAL NAVIGATION N/A 02/21/2024   Procedure: COMPUTER-ASSISTED NAVIGATION, FOR CRANIAL PROCEDURE;  Surgeon: Gillie Duncans, MD;  Location: [facility] OR;  Service: Neurosurgery;  Laterality: N/A;   CRANIOTOMY  05/26/2016   Procedure: SUBOCCIPITAL CRANIOTOMY WITH PLACEMENT OF VENTRICULAR CATHETER;  Surgeon: Duncans Gillie, MD;  Location: [facility] OR;  Service: Neurosurgery;;  CRANIOTOMY - SUBOCCIPITAL   CRANIOTOMY Left 02/21/2024   Procedure: CRANIOTOMY TUMOR EXCISION;  Surgeon: Gillie Duncans, MD;  Location: [facility] OR;  Service: Neurosurgery;  Laterality: Left;  Occipital - left Craniotomy for tumor resection with Stealth   WISDOM TOOTH EXTRACTION       Social History:   reports that she has never smoked. She has never used smokeless tobacco. She reports current alcohol  use. She reports that she does not use drugs.    Family History:  Her family history includes Cirrhosis in her paternal grandfather; Diabetes in her father; Heart disease in her mother; Hypertension in her father and mother; Kidney disease in her father; Ovarian cancer in her maternal grandmother; Thyroid  cancer in her maternal aunt; Ulcers in her father. There is no history of Colon cancer, Esophageal cancer, Pancreatic cancer, or Stomach cancer.   Allergies Allergies  Allergen Reactions   Watermelon [Citrullus Vulgaris] Nausea And Vomiting     Home Medications  Prior to Admission medications   Medication Sig Start Date End Date Taking? Authorizing Provider  acetaminophen  (TYLENOL ) 325 MG tablet Take 325 mg by mouth every 6 (six) hours as needed for moderate pain (pain score 4-6).   Yes [provider]  aspirin-acetaminophen -caffeine (EXCEDRIN MIGRAINE) 250-250-65 MG tablet Take 1 tablet by mouth every 6 (six) hours as needed for headache.   Yes [provider]  atenolol  (TENORMIN ) 50 MG tablet Take 1 tablet (50 mg total) by mouth daily. 05/21/23  Yes   BLACK COHOSH PO Take 700 mg by mouth daily.   Yes [provider]  buPROPion  (WELLBUTRIN  SR) 150 MG 12 hr tablet Take 1 tablet (150 mg total) by mouth daily. 10/16/23  Yes Geralene Kaiser, MD  escitalopram  (LEXAPRO ) 20 MG tablet Take 2 tablets (40 mg total) by mouth daily. 10/16/23  Yes Geralene Kaiser, MD  hydrochlorothiazide  (HYDRODIURIL ) 12.5 MG tablet Take 1 tablet (12.5 mg total) by mouth daily. 05/21/23  Yes   ibuprofen (ADVIL,MOTRIN) 200 MG tablet Take 200-400 mg by mouth 2 (two) times daily as needed for headache or moderate pain.   Yes [provider]  OVER THE COUNTER MEDICATION Take 2 capsules by mouth daily. Emma supplement   Yes [provider]  SODIUM FLUORIDE , DENTAL RINSE, (PREVIDENT ) 0.2 % SOLN USE AS AN ORAL RINSE, AS DIRECTED ON PACKAGE Patient taking differently: 1 Dose by Mouth Rinse route daily as needed. 12/13/23  Yes    chlorhexidine  (PERIOGARD ) 0.12 % solution RINSE MOUTH WITH (1 CAPFUL) FOR 30 SECONDS IN THE MORNING AND EVENING AFTER TOOTHBRUSHING. EXPECTORATE AFTER RINSING, DO NOT SWALLOW. 05/27/22     Cholecalciferol  (VITAMIN D ) 50 MCG (2000 UT) CAPS Take 2 capsules (4,000 Units total) by mouth daily. Patient not taking: Reported on 02/05/2024 11/03/21     Semaglutide -Weight Management (WEGOVY ) 0.5 MG/0.5ML SOAJ Inject 0.5 mg into the skin once a week. Patient not taking: Reported on 02/05/2024 02/28/22     Semaglutide -Weight Management (WEGOVY ) 1 MG/0.5ML SOAJ Inject 1mg  under the skin once a week Patient not taking: Reported on 02/05/2024 03/24/22     Semaglutide -Weight Management (WEGOVY ) 1 MG/0.5ML SOAJ Inject 1 mg into the skin once a week. Patient not taking: Reported on 02/05/2024 09/05/22     Semaglutide -Weight Management (WEGOVY ) 1.7 MG/0.75ML SOAJ Inject 1.7 mg into the skin once a week. Patient not taking: Reported on 02/05/2024 10/25/22     Semaglutide -Weight Management (WEGOVY ) 2.4 MG/0.75ML SOAJ Inject 2.4 mg into the skin once a week. Patient not taking: Reported on 02/05/2024 07/17/22     Semaglutide -Weight Management (WEGOVY ) 2.4 MG/0.75ML SOAJ Inject 2.4 mg into the skin once a week. Patient not taking: Reported on 02/05/2024 07/20/22     SODIUM FLUORIDE , DENTAL RINSE, (PREVIDENT ) 0.2 % SOLN Use as an oral rinse as directed on package Patient not taking: Reported on 02/05/2024 07/20/21     SODIUM FLUORIDE , DENTAL RINSE, (PREVIDENT ) 0.2 % SOLN USE AS AN ORAL RINSE, AS DIRECTED ON PACKAGE 04/21/22        Critical care time: N/A    Sammi Gore, PA - [facility] For pager details, please see AMION or use Epic chat  After 1900, please call ELINK for cross coverage needs 02/26/2024, 10:18 PM

## 2024-02-26 NOTE — Progress Notes (Addendum)
 Transition of Care [facility]) - Inpatient Brief Assessment   Patient Details  Name: Alexa [name] MRN: [id number] Date of Birth: [birth year]  [facility] CM/SW Contact:    Robynn Eileen Hoose, RN Phone Number: 02/26/2024, 9:40 AM   Clinical Narrative: Patient from home with meningioma and having tumor resection done. Second surgery possible tomorrow.   Transition of Care Asessment: Insurance and Status: (P) Insurance coverage has been reviewed Patient has primary care physician: (P) No (None listed)   Prior level of function:: (P) Home Prior/Current Home Services: (P) No current home services Social Drivers of Health Review: (P) SDOH reviewed no interventions necessary Readmission risk has been reviewed: (P) Yes Transition of care needs: (P) no transition of care needs at this time

## 2024-02-26 NOTE — Anesthesia Procedure Notes (Signed)
 Procedure Name: Intubation Date/Time: 02/26/2024 2:13 PM  Performed by: Emmitt Millman, CRNAPre-anesthesia Checklist: Patient identified, Emergency Drugs available, Suction available and Patient being monitored Patient Re-evaluated:Patient Re-evaluated prior to induction Oxygen Delivery Method: Circle system utilized Preoxygenation: Pre-oxygenation with 100% oxygen Induction Type: IV induction Ventilation: Mask ventilation without difficulty Laryngoscope Size: Mac and 4 Grade View: Grade I Tube type: Oral Tube size: 7.0 mm Number of attempts: 1 Airway Equipment and Method: Stylet and Oral airway Placement Confirmation: ETT inserted through vocal cords under direct vision, positive ETCO2 and breath sounds checked- equal and bilateral Secured at: 22 cm Tube secured with: Tape Dental Injury: Teeth and Oropharynx as per pre-operative assessment

## 2024-02-26 NOTE — Anesthesia Preprocedure Evaluation (Addendum)
 Anesthesia Evaluation  Patient identified by MRN, date of birth, ID band Patient awake    Reviewed: Allergy & Precautions, NPO status , Patient's Chart, lab work & pertinent test results, reviewed documented beta blocker date and time   History of Anesthesia Complications (+) PONV  Airway Mallampati: II  TM Distance: >3 FB Neck ROM: Full    Dental  (+) Dental Advisory Given, Caps   Pulmonary neg pulmonary ROS   breath sounds clear to auscultation       Cardiovascular hypertension, Pt. on medications and Pt. on home beta blockers (-) angina  Rhythm:Regular Rate:Normal     Neuro/Psych  Headaches  Anxiety Depression    Meningioma: s/p partial excision    GI/Hepatic Neg liver ROS,GERD  Controlled,,  Endo/Other  BMI 33 Wegovy : 3 months ago  Renal/GU negative Renal ROS     Musculoskeletal   Abdominal   Peds  Hematology  (+) Blood dyscrasia (Hb 10.6, plt 313k), anemia , JEHOVAH'S WITNESS (accepts albumin )  Anesthesia Other Findings   Reproductive/Obstetrics                              Anesthesia Physical Anesthesia Plan  ASA: 3  Anesthesia Plan: General   Post-op Pain Management: Tylenol  PO (pre-op)*   Induction: Intravenous  PONV Risk Score and Plan: 4 or greater and Ondansetron , Dexamethasone  and Scopolamine  patch - Pre-op  Airway Management Planned: Oral ETT  Additional Equipment: Arterial line  Intra-op Plan:   Post-operative Plan: Extubation in OR  Informed Consent: I have reviewed the patients History and Physical, chart, labs and discussed the procedure including the risks, benefits and alternatives for the proposed anesthesia with the patient or authorized representative who has indicated his/her understanding and acceptance.     Dental advisory given  Plan Discussed with: CRNA and Surgeon  Anesthesia Plan Comments: (With family present, pt verifies absolutely no  blood or blood component therapy, even in the event of exsanguination causing death)         Anesthesia Quick Evaluation

## 2024-02-26 NOTE — Transfer of Care (Signed)
 Immediate Anesthesia Transfer of Care Note  Patient: Alexa [name] Sar  Procedure(s) Performed: CRANIOTOMY TUMOR EXCISION (Left)  Patient Location: PACU  Anesthesia Type:General  Level of Consciousness: awake, drowsy, patient cooperative, and responds to stimulation  Airway & Oxygen Therapy: Patient Spontanous Breathing and Patient connected to face mask oxygen  Post-op Assessment: Report given to RN and Post -op Vital signs reviewed and stable  Post vital signs: Reviewed and stable  Last Vitals:  Vitals Value Taken Time  BP 115/75 02/26/24 20:15  Temp 37.3 C 02/26/24 20:15  Pulse 76 02/26/24 20:15  Resp 13 02/26/24 20:15  SpO2 96 % 02/26/24 20:15  Vitals shown include unfiled device data.  Pt denies pain   Patients Stated Pain Goal: 0 (02/26/24 1320)  Complications: No notable events documented.

## 2024-02-26 NOTE — Anesthesia Procedure Notes (Signed)
 Arterial Line Insertion Start/End7/28/2025 1:45 PM, 02/26/2024 1:47 PM Performed by: Emmitt Millman, CRNA, CRNA  Patient location: Pre-op. Preanesthetic checklist: patient identified, IV checked, site marked, risks and benefits discussed, surgical consent, monitors and equipment checked, pre-op evaluation, timeout performed and anesthesia consent Lidocaine  1% used for infiltration Right, radial was placed Catheter size: 20 G Hand hygiene performed , maximum sterile barriers used  and Seldinger technique used Allen's test indicative of satisfactory collateral circulation Attempts: 1 Procedure performed without using ultrasound guided technique. Following insertion, dressing applied. Post procedure assessment: normal and unchanged  Patient tolerated the procedure well with no immediate complications.

## 2024-02-26 NOTE — Progress Notes (Signed)
 Patient transported to short stay via bed, patients mother and aunt at bedside, all questions answered. Report given to short stay RN.

## 2024-02-27 ENCOUNTER — Encounter (HOSPITAL_COMMUNITY): Payer: Self-pay | Admitting: Neurosurgery

## 2024-02-27 DIAGNOSIS — I10 Essential (primary) hypertension: Secondary | ICD-10-CM | POA: Diagnosis not present

## 2024-02-27 DIAGNOSIS — Z9889 Other specified postprocedural states: Secondary | ICD-10-CM | POA: Diagnosis not present

## 2024-02-27 LAB — CBC
HCT: 29.8 % — ABNORMAL LOW (ref 36.0–46.0)
Hemoglobin: 9.9 g/dL — ABNORMAL LOW (ref 12.0–15.0)
MCH: 28.5 pg (ref 26.0–34.0)
MCHC: 33.2 g/dL (ref 30.0–36.0)
MCV: 85.9 fL (ref 80.0–100.0)
Platelets: 314 K/uL (ref 150–400)
RBC: 3.47 MIL/uL — ABNORMAL LOW (ref 3.87–5.11)
RDW: 12 % (ref 11.5–15.5)
WBC: 9 K/uL (ref 4.0–10.5)
nRBC: 0 % (ref 0.0–0.2)

## 2024-02-27 LAB — SURGICAL PATHOLOGY

## 2024-02-27 LAB — CREATININE, SERUM
Creatinine, Ser: 0.69 mg/dL (ref 0.44–1.00)
GFR, Estimated: >60 mL/min (ref >60)

## 2024-02-27 NOTE — Progress Notes (Signed)
 Patient ID: Alexa [name], female   DOB: [birth year], 53 y.o.   MRN: [id number] BP 104/68   Pulse 70   Temp 99.5 F (37.5 C) (Oral)   Resp 15   Ht 5' 8.5 (1.74 m)   Wt 98.9 kg   LMP 12/26/2023 (Approximate)   SpO2 92%   BMI 32.66 kg/m  Alert and oriented x 4. Complaining of headache.  MRI shows gross total resection.  Will transfer tomorrow off the unit.  Doing well overall

## 2024-02-27 NOTE — Progress Notes (Signed)
 eLink Physician-Brief Progress Note Patient Name: Alexa [name] DOB: 1971-02-28 MRN: [id number]   Date of Service  02/27/2024  HPI/Events of Note  Arterial line not working, not on pressors, noninvasive BP monitoring which effectively  eICU Interventions  DC arterial line, continue monitoring     Intervention Category Minor Interventions: Routine modifications to care plan (e.g. PRN medications for pain, fever)  Alexa [name] 02/27/2024, 4:51 AM

## 2024-02-27 NOTE — Plan of Care (Signed)
 Expected post-op pain. MRI done. No other complaints.  Problem: Education: Goal: Knowledge of General Education information will improve Description: Including pain rating scale, medication(s)/side effects and non-pharmacologic comfort measures Outcome: Progressing   Problem: Health Behavior/Discharge Planning: Goal: Ability to manage health-related needs will improve Outcome: Progressing   Problem: Clinical Measurements: Goal: Ability to maintain clinical measurements within normal limits will improve Outcome: Progressing Goal: Will remain free from infection Outcome: Progressing Goal: Diagnostic test results will improve Outcome: Progressing Goal: Respiratory complications will improve Outcome: Progressing Goal: Cardiovascular complication will be avoided Outcome: Progressing

## 2024-02-27 NOTE — Progress Notes (Signed)
 NAME:  Alexa [name], MRN:  [id number], DOB:  05/20/1971, LOS: 6 ADMISSION DATE:  02/21/2024, CONSULTATION DATE:  7//28 REFERRING MD:  Gillie LOOSE CHIEF COMPLAINT:  s/p craniotomy   History of Present Illness:  53 year old female with past medical history of anxiety/depression, GERD, hypertension, meningioma who presents 5 days ago for meningioma removal. Initially admitted 02/21/24 with recurrent meningioma in supratentorial and infratentorial spaces. She was admitted to neuro ICU post-operatively. Follow-up MRI showed cap of tumor left behind and after discussion with neurosurgery, elected to proceed with stage II resection 7/28.   Pertinent  Medical History  Anxiety, depression, GERD, HTN, recurrent meningioma   Significant Hospital Events: Including procedures, antibiotic start and stop dates in addition to other pertinent events   7/23: admit s/p meningioma removal. F/u MRI with tumor cap left.  7/28: stage II meningioma removal - redo crani  Interim History / Subjective:   Complains of pain On room air  Objective   Blood pressure 129/77, pulse 63, temperature 99.2 F (37.3 C), temperature source Axillary, resp. rate 14, height 5' 8.5 (1.74 m), weight 98.9 kg, last menstrual period 12/26/2023, SpO2 95%.    FiO2 (%):  [28 %] 28 %   Intake/Output Summary (Last 24 hours) at 02/27/2024 1019 Last data filed at 02/27/2024 0800 Gross per 24 hour  Intake 4372.83 ml  Output 2818 ml  Net 1554.83 ml   Filed Weights   02/21/24 0604 02/26/24 1256  Weight: 99.3 kg 98.9 kg    Examination: General: Adult female, resting in bed, in NAD. Neuro: Sleepy post op but easily arousable, nonfocal, pupils 3 mm reactive to light HEENT: Scalp dressings clean and dry. [place]/AT. Sclerae anicteric. EOMI. Cardiovascular: RRR, no M/R/G.  Lungs: Clear, no accessory muscle use Abdomen: BS x 4, soft, NT/ND.  Musculoskeletal: No gross deformities, no edema.   Labs show normal creatinine, stable  hemoglobin   Assessment & Plan:   Recurrent meningioma s/p craniotomy with stage I resection 7/23 followed by stage II resection 7/28. - NSGY primary, appreciate management. - SBP goal < 150.  - Empiric Keppra . - Continue Decadron . - Post-op pain control per neurosurgery.   Hypocalcemia. - Repleted   Hx Post-operative nausea and vomiting.  - Zofran  PRN, Compazine  PRN.  Hx hypertension. - SBP goal < 150. - Continue Hydralazine , can DC if blood pressure at goal - Continue PTA atenolol  and hydrochlorothiazide  for now.  Hx GERD. - PPI.  Hx Anxiety, Depression. - Continue PTA Wellbutrin , Lexapro .    Best Practice (right click and Reselect all SmartList Selections daily)   Diet/type: Regular consistency (see orders) DVT prophylaxis: prophylactic heparin   GI prophylaxis: PPI Lines: Arterial Line and yes and it is still needed Foley:  N/A Code Status:  full code Last date of multidisciplinary goals of care discussion [per primary]  Labs   CBC: Recent Labs  Lab 02/21/24 1229 02/21/24 1532 02/23/24 0639 02/26/24 1802 02/27/24 0043  WBC  --  4.9 14.9*  --  9.0  HGB 9.5* 11.8* 10.6* 8.8* 9.9*  HCT 28.0* 35.9* 31.4* 26.0* 29.8*  MCV  --  86.1 84.9  --  85.9  PLT  --  310 313  --  314    Basic Metabolic Panel: Recent Labs  Lab 02/21/24 1229 02/21/24 1532 02/23/24 0639 02/25/24 0618 02/26/24 0700 02/26/24 1802 02/27/24 0043  NA 144  --  141 141 138 138  --   K 3.2*  --  3.8 3.1* 3.8 3.8  --  CL  --   --  108 102 101  --   --   CO2  --   --  25 29 28   --   --   GLUCOSE  --   --  123* 102* 94  --   --   BUN  --   --  15 9 8   --   --   CREATININE  --  0.76 1.03* 0.71 0.74  --  0.69  CALCIUM   --   --  9.2 8.8* 9.0  --   --   MG  --   --  2.2  --  1.8  --   --   PHOS  --   --  1.9* 2.7  --   --   --    GFR: Estimated Creatinine Clearance: 100.9 mL/min (by C-G formula based on SCr of 0.69 mg/dL). Recent Labs  Lab 02/21/24 1532 02/23/24 0639  02/27/24 0043  WBC 4.9 14.9* 9.0    Liver Function Tests: No results for input(s): AST, ALT, ALKPHOS, BILITOT, PROT, ALBUMIN  in the last 168 hours. No results for input(s): LIPASE, AMYLASE in the last 168 hours. No results for input(s): AMMONIA in the last 168 hours.  ABG    Component Value Date/Time   PHART 7.517 (H) 02/26/2024 1802   PCO2ART 28.1 (L) 02/26/2024 1802   PO2ART 127 (H) 02/26/2024 1802   HCO3 22.8 02/26/2024 1802   TCO2 24 02/26/2024 1802   ACIDBASEDEF 6.0 (H) 02/21/2024 1229   O2SAT 99 02/26/2024 1802     Coagulation Profile: No results for input(s): INR, PROTIME in the last 168 hours.  Cardiac Enzymes: No results for input(s): CKTOTAL, CKMB, CKMBINDEX, TROPONINI in the last 168 hours.  HbA1C: No results found for: HGBA1C  CBG: No results for input(s): GLUCAP in the last 168 hours.  Harden Staff MD. DE[facility]. [facility] Pager : [phone]  If no response to pager , please call [phone] until 7 pm After 7:00 pm call Elink  [phone]     02/27/2024, 10:19 AM

## 2024-02-27 NOTE — Op Note (Signed)
 02/21/2024  8:54 PM  PATIENT:  Alexa [name]  53 y.o. female with a recurrent meningioma admitted for tumor resection with stereotactic navigation PRE-OPERATIVE DIAGNOSIS:  Meningioma  POST-OPERATIVE DIAGNOSIS:  Meningioma  PROCEDURE:  Procedure(s) Left occipital CRANIOTOMY TUMOR EXCISION COMPUTER-ASSISTED NAVIGATION, FOR CRANIAL PROCEDURE  SURGEON: Surgeon(s): Gillie Duncans, MD  ASSISTANTS:none  ANESTHESIA:   local and general  EBL:  No intake/output data recorded.  BLOOD ADMINISTERED:none  CELL SAVER GIVEN:none  COUNT:per nursing  DRAINS: none   SPECIMEN:  Source of Specimen:  brain   DICTATION: Alexa [name] was taken to the operating room, intubated, and placed under a general anesthetic without difficulty. She was positioned lateral decubitus left side down. Once adequate anesthesia was obtained I placed her head in a Mayfield head holder so that the incision was parallel to the floor. I attached the stealth navigation frame holder to the Mayfield. I registered landmarks from her head and scalp to achieve a good fit. Once verified and calibrated her head was prepped and was draped in a sterile manner.  I opened the scalp along the old incision and exposed the craniectomy and intact occiput. I used cerebellar retractors to maintain exposure. I removed some of the inferior bone to ease the resection. I opened the dura which was intimate with the tumor. I started the resection.  I used navigation to guide the the resection. With the cusa and suction along with cautery I resected the tumor.  During the resection I used navigation to assess my boundaries. This was a continuous rhythm for the duration of the case. The tumor was monotonous in texture, fairly dry, but extensive. I continued with resection until the navigation indicated that I was at the boundaries of the tumor. I achieved a dry resection bed, and used gelfoam and surgicel to line the cavity. At that point I halted  resection and started to close. I placed a dural patch suturing it to the dural edges. I used dural sealant to augment the sutures.  I replaced the small piece of bone with plates and screws. I approximated the deep cervical musculature with vicryl sutures. I approximated the scalp edges with a running nylon suture. I applied a sterile dressing. I removed the head holder from her head. She was turned supine. [name] was extubated without incident. She was taken to the PACU  PLAN OF CARE: Admit to inpatient   PATIENT DISPOSITION:  PACU - hemodynamically stable.   Delay start of Pharmacological VTE agent (>24hrs) due to surgical blood loss or risk of bleeding:  no

## 2024-02-27 NOTE — Evaluation (Signed)
 Physical Therapy Evaluation Patient Details Name: Alexa [name] MRN: [id number] DOB: July 03, 1971 Today's Date: 02/27/2024  History of Present Illness  53 y.o. female presents to [facility] hospital on 02/21/2024 with a recurrent meningioma. Pt underwent crani with tumor resection on 7/23. MRI on 7/24 demonstrates remaining tumor. Pt underwent subsequent craniotomy with meningioma resection on 7/28. PMH includes social anxiety disorder, MDD, GERD, HTN.  Clinical Impression  Pt presents to PT with deficits in activity tolerance, gait, balance, endurance. Pt is able to transfer and ambulate with slow and cautious gait, reporting wooziness when up this session. Pt does manage ADLs within the bathroom independently during session. PT encourages frequent mobilization in an effort to improve endurance along with gait and balance quality. PT is hopeful the pt progresses quickly and anticipates no post-acute PT needs.        If plan is discharge home, recommend the following: Assistance with cooking/housework;Assist for transportation;Help with stairs or ramp for entrance   Can travel by private vehicle        Equipment Recommendations None recommended by PT  Recommendations for Other Services       Functional Status Assessment Patient has had a recent decline in their functional status and demonstrates the ability to make significant improvements in function in a reasonable and predictable amount of time.     Precautions / Restrictions Precautions Precautions: Fall Recall of Precautions/Restrictions: Intact Restrictions Weight Bearing Restrictions Per Provider Order: No      Mobility  Bed Mobility Overal bed mobility: Needs Assistance Bed Mobility: Supine to Sit, Sit to Supine     Supine to sit: Supervision Sit to supine: Supervision        Transfers Overall transfer level: Needs assistance Equipment used: None Transfers: Sit to/from Stand Sit to Stand: Supervision                 Ambulation/Gait Ambulation/Gait assistance: Contact guard assist Gait Distance (Feet): 40 Feet Assistive device: None Gait Pattern/deviations: Step-to pattern Gait velocity: reduced Gait velocity interpretation: <1.8 ft/sec, indicate of risk for recurrent falls   General Gait Details: slowed step-to gait, reduced step length bilaterally, cautious gait pattern with high guard  Stairs            Wheelchair Mobility     Tilt Bed    Modified Rankin (Stroke Patients Only)       Balance Overall balance assessment: Needs assistance Sitting-balance support: No upper extremity supported, Feet supported Sitting balance-Leahy Scale: Good     Standing balance support: No upper extremity supported, During functional activity Standing balance-Leahy Scale: Fair                               Pertinent Vitals/Pain Pain Assessment Pain Assessment: No/denies pain    Home Living Family/patient expects to be discharged to:: Private residence Living Arrangements: Parent;Other relatives Available Help at Discharge: Family;Available 24 hours/day Type of Home: House Home Access: Ramped entrance       Home Layout: One level Home Equipment: Agricultural consultant (2 wheels);Cane - single point      Prior Function Prior Level of Function : Independent/Modified Independent;Working/employed;Driving                     Extremity/Trunk Assessment   Upper Extremity Assessment Upper Extremity Assessment: Overall WFL for tasks assessed    Lower Extremity Assessment Lower Extremity Assessment: Generalized weakness    Cervical / Trunk Assessment  Cervical / Trunk Assessment: Normal  Communication   Communication Communication: No apparent difficulties    Cognition Arousal: Alert Behavior During Therapy: WFL for tasks assessed/performed   PT - Cognitive impairments: No apparent impairments                         Following commands: Intact        Cueing Cueing Techniques: Verbal cues     General Comments General comments (skin integrity, edema, etc.): VSS on RA, pt reports wooziness when ambulating, BP stable    Exercises     Assessment/Plan    PT Assessment Patient needs continued PT services  PT Problem List Decreased activity tolerance;Decreased strength;Decreased balance;Decreased mobility       PT Treatment Interventions DME instruction;Gait training;Stair training;Functional mobility training;Therapeutic activities;Therapeutic exercise;Balance training;Neuromuscular re-education;Patient/family education    PT Goals (Current goals can be found in the Care Plan section)  Acute Rehab PT Goals Patient Stated Goal: to return to independence PT Goal Formulation: With patient Time For Goal Achievement: 03/12/24 Potential to Achieve Goals: Good Additional Goals Additional Goal #1: Pt will score >19/24 on the DGI to indicate a reduced risk for falls Additional Goal #2: Pt will score >45/56 on the BERG to indicate a reduced risk for falls    Frequency Min 2X/week     Co-evaluation               AM-PAC PT 6 Clicks Mobility  Outcome Measure Help needed turning from your back to your side while in a flat bed without using bedrails?: A Little Help needed moving from lying on your back to sitting on the side of a flat bed without using bedrails?: A Little Help needed moving to and from a bed to a chair (including a wheelchair)?: A Little Help needed standing up from a chair using your arms (e.g., wheelchair or bedside chair)?: A Little Help needed to walk in hospital room?: A Little Help needed climbing 3-5 steps with a railing? : A Lot 6 Click Score: 17    End of Session   Activity Tolerance: Patient tolerated treatment well Patient left: in bed;with call bell/phone within reach;with family/visitor present Nurse Communication: Mobility status PT Visit Diagnosis: Other abnormalities of gait and mobility  (R26.89);Other symptoms and signs involving the nervous system (R29.898)    Time: 8291-8268 PT Time Calculation (min) (ACUTE ONLY): 23 min   Charges:   PT Evaluation $PT Eval Low Complexity: 1 Low   PT General Charges $$ ACUTE PT VISIT: 1 Visit         Bernardino JINNY Ruth, PT, DPT Acute Rehabilitation Office [phone]   Bernardino JINNY Ruth 02/27/2024, 5:41 PM

## 2024-02-28 DIAGNOSIS — Z9889 Other specified postprocedural states: Secondary | ICD-10-CM | POA: Diagnosis not present

## 2024-02-28 LAB — CBC WITH DIFFERENTIAL/PLATELET
Abs Immature Granulocytes: 0.16 K/uL — ABNORMAL HIGH (ref 0.00–0.07)
Basophils Absolute: 0 K/uL (ref 0.0–0.1)
Basophils Relative: 0 %
Eosinophils Absolute: 0 K/uL (ref 0.0–0.5)
Eosinophils Relative: 0 %
HCT: 25.2 % — ABNORMAL LOW (ref 36.0–46.0)
Hemoglobin: 8.3 g/dL — ABNORMAL LOW (ref 12.0–15.0)
Immature Granulocytes: 1 %
Lymphocytes Relative: 14 %
Lymphs Abs: 1.6 K/uL (ref 0.7–4.0)
MCH: 28.5 pg (ref 26.0–34.0)
MCHC: 32.9 g/dL (ref 30.0–36.0)
MCV: 86.6 fL (ref 80.0–100.0)
Monocytes Absolute: 1.4 K/uL — ABNORMAL HIGH (ref 0.1–1.0)
Monocytes Relative: 13 %
Neutro Abs: 8 K/uL — ABNORMAL HIGH (ref 1.7–7.7)
Neutrophils Relative %: 72 %
Platelets: 297 K/uL (ref 150–400)
RBC: 2.91 MIL/uL — ABNORMAL LOW (ref 3.87–5.11)
RDW: 12.4 % (ref 11.5–15.5)
WBC: 11.2 K/uL — ABNORMAL HIGH (ref 4.0–10.5)
nRBC: 0 % (ref 0.0–0.2)

## 2024-02-28 LAB — BASIC METABOLIC PANEL WITH GFR
Anion gap: 6 (ref 5–15)
BUN: 8 mg/dL (ref 6–20)
CO2: 27 mmol/L (ref 22–32)
Calcium: 8.7 mg/dL — ABNORMAL LOW (ref 8.9–10.3)
Chloride: 102 mmol/L (ref 98–111)
Creatinine, Ser: 0.73 mg/dL (ref 0.44–1.00)
GFR, Estimated: >60 mL/min (ref >60)
Glucose, Bld: 126 mg/dL — ABNORMAL HIGH (ref 70–99)
Potassium: 3.9 mmol/L (ref 3.5–5.1)
Sodium: 135 mmol/L (ref 135–145)

## 2024-02-28 LAB — MAGNESIUM: Magnesium: 2 mg/dL (ref 1.7–2.4)

## 2024-02-28 LAB — PHOSPHORUS: Phosphorus: 2.5 mg/dL (ref 2.5–4.6)

## 2024-02-28 MED ORDER — DOCUSATE SODIUM 100 MG PO CAPS
100.0000 mg | ORAL_CAPSULE | Freq: Every day | ORAL | Status: DC | PRN
  Administered 2024-02-28 – 2024-02-29 (×2): 100 mg via ORAL
  Filled 2024-02-28 (×2): qty 1

## 2024-02-28 NOTE — Progress Notes (Signed)
 Patient ID: Alexa [name], female   DOB: 10/24/1970, 53 y.o.   MRN: [id number] BP 128/73   Pulse 61   Temp 98.6 F (37 C) (Oral)   Resp 18   Ht 5' 8.5 (1.74 m)   Wt 98.9 kg   LMP 12/26/2023 (Approximate)   SpO2 96%   BMI 32.66 kg/m  Alert and oriented x 4, speech is clear and fluent Moving all extremities well Symmetric facies, tongue and uvula midline Hearing intact to voice Ok for transfer possible discharge tomorrow

## 2024-02-28 NOTE — Plan of Care (Signed)
 Patient able to ambulate to bathroom independently with stand by assist only. Patient complained of pain at surgical site but managed pain with ordered prn medications given. No signs of altered mental status or infection.

## 2024-02-28 NOTE — Op Note (Signed)
 02/26/2024  8:19 PM  PATIENT:  Alexa [name]  53 y.o. female With residual meningioma, taken to the operating room for definitive resection. Post op scan showed residual tumor. I will not use navigation and complete the resection PRE-OPERATIVE DIAGNOSIS:  Brain Tumor, meningioma  POST-OPERATIVE DIAGNOSIS:  Brain Tumor, meningioma  PROCEDURE:  Procedure(s): CRANIOTOMY for TUMOR EXCISION microdissection SURGEON: Surgeon(s): Gillie Duncans, MD  ASSISTANTS:none  ANESTHESIA:   general  EBL:  No intake/output data recorded.  BLOOD ADMINISTERED:none  CELL SAVER GIVEN:not used  COUNT:per nursing  DRAINS: Urinary Catheter (Foley)   SPECIMEN:  Source of Specimen:  brain  DICTATION: Alexa [name] was taken to the operating room, intubated, and placed under a general anesthetic without difficulty. Once adequate anesthesia was obtained I placed her head in a three pin Mayfield head holder. I put [name] into a lateral decubitus position with her right side down this placed the incision parallel to the floor her head turned towards the right with the chin tucked. Her head was positioned and secured with the Mayfield adapter, shaved then prepped and draped in a sterile manner.  I opened the incision with a 10 blade and exposed the skull and craniectomy site. With the post op scan as a guide I started the resection. I brought the microscope into the operative field to allow microdissection of the tumor. I removed the dural patch which I had placed. I also removed the small craniotomy I had created. I removed the screws and bone exposing the brain and remaining tumor.  I used the CUSA and suction along with bipolar cautery to remove the tumor. I worked rostrally until I could appreciate the tumor capsule. I then dissected between the tumor capsule and the occipital lobe on the left. I used cottonoids to protect the brain surface. I used cautery to shrink the capsule and to devascularize it.  I cauterized blood vessels supplying the capsule and divided them sharply. I worked steadily around the surface of the capsule until I was able to remove a large portion of the capsule and tumor. I observed normal brain around the edges indicating gross total resection of the tumor. I used the cusa to remove some tumor on the edges of the resection site. I did this until I was satisfied with my resection. I controlled bleeding during the case with cautery, and at closure. With good hemostasis I applied a dural patch sewn into the bone and surrounding dural edges. I replaced the bone using plates and screws.  I approximated the cervical muscles and soft tissue with sutures. I closed the scalp with nylon suture. I applied a sterile dressing. I removed the head holder, and she was rolled onto her back. She was extubated on the OR table then moved to the stretcher. PLAN OF CARE: Admit to inpatient   PATIENT DISPOSITION:  PACU - hemodynamically stable.   Delay start of Pharmacological VTE agent (>24hrs) due to surgical blood loss or risk of bleeding:  no

## 2024-02-28 NOTE — Progress Notes (Signed)
 Patient's Blood pressure reads 100/58. Scheduled hydralyzine held.

## 2024-02-28 NOTE — Progress Notes (Signed)
 NAME:  Alexa [name], MRN:  [id number], DOB:  1971-03-13, LOS: 7 ADMISSION DATE:  02/21/2024, CONSULTATION DATE:  7//28 REFERRING MD:  Gillie LOOSE CHIEF COMPLAINT:  s/p craniotomy   History of Present Illness:  53 year old female with past medical history of anxiety/depression, GERD, hypertension, meningioma who presents 5 days ago for meningioma removal. Initially admitted 02/21/24 with recurrent meningioma in supratentorial and infratentorial spaces. She was admitted to neuro ICU post-operatively. Follow-up MRI showed cap of tumor left behind and after discussion with neurosurgery, elected to proceed with stage II resection 7/28.   Pertinent  Medical History  Anxiety, depression, GERD, HTN, recurrent meningioma   Significant Hospital Events: Including procedures, antibiotic start and stop dates in addition to other pertinent events   7/23: admit s/p meningioma removal. F/u MRI with tumor cap left.  7/28: stage II meningioma removal - redo crani 7/29 redo crani  Interim History / Subjective:  Redo crani last night No complaints this am; pain controlled  Objective   Blood pressure 112/60, pulse 80, temperature 98.1 F (36.7 C), temperature source Oral, resp. rate 16, height 5' 8.5 (1.74 m), weight 98.9 kg, last menstrual period 12/26/2023, SpO2 96%.        Intake/Output Summary (Last 24 hours) at 02/28/2024 1034 Last data filed at 02/28/2024 1000 Gross per 24 hour  Intake 1189.27 ml  Output 0 ml  Net 1189.27 ml   Filed Weights   02/21/24 0604 02/26/24 1256  Weight: 99.3 kg 98.9 kg    Examination: General:   NAD HEENT: MM pink/moist Neuro: Aox3; MAE CV: s1s2, RRR, no m/r/g PULM:  dim clear BS bilaterally; room air GI: soft, bsx4 active  Extremities: warm/dry, no edema    Assessment & Plan:   Recurrent meningioma s/p craniotomy with stage I resection 7/23 followed by stage II resection 7/28 and 7/29 Plan: -nsg primary; appreciate recs -repeat imaging per  nsg -cont keprra and decadron  -pain management -SBP goal per neuro -statin, DAPT per neuro -Continue neuroprotective measures- normothermia, euglycemia, HOB greater than 30, head in neutral alignment, normocapnia, normoxia  Hypocalcemia: improved Plan: -trend and replete as needed  Hx Post-operative nausea and vomiting.  Plan: - prn Zofran  and Compazine    Hx hypertension. Plan: -sbp goal < 150  -po hydrochlorothiazide , atenolol ; hold home hydralazine  for now -prn hydralazine   Hx GERD. Plan: - PPI.  Hx Anxiety, Depression. Plan: - Continue PTA Wellbutrin , Lexapro .    Best Practice (right click and Reselect all SmartList Selections daily)   Diet/type: Regular consistency (see orders) DVT prophylaxis: prophylactic heparin   GI prophylaxis: PPI Lines: Arterial Line and yes and it is still needed Foley:  N/A Code Status:  full code Last date of multidisciplinary goals of care discussion [per primary]  Labs   CBC: Recent Labs  Lab 02/21/24 1532 02/23/24 0639 02/26/24 1802 02/27/24 0043 02/28/24 0602  WBC 4.9 14.9*  --  9.0 11.2*  NEUTROABS  --   --   --   --  8.0*  HGB 11.8* 10.6* 8.8* 9.9* 8.3*  HCT 35.9* 31.4* 26.0* 29.8* 25.2*  MCV 86.1 84.9  --  85.9 86.6  PLT 310 313  --  314 297    Basic Metabolic Panel: Recent Labs  Lab 02/23/24 0639 02/25/24 0618 02/26/24 0700 02/26/24 1802 02/27/24 0043 02/28/24 0602  NA 141 141 138 138  --  135  K 3.8 3.1* 3.8 3.8  --  3.9  CL 108 102 101  --   --  102  CO2 25 29 28   --   --  27  GLUCOSE 123* 102* 94  --   --  126*  BUN 15 9 8   --   --  8  CREATININE 1.03* 0.71 0.74  --  0.69 0.73  CALCIUM  9.2 8.8* 9.0  --   --  8.7*  MG 2.2  --  1.8  --   --  2.0  PHOS 1.9* 2.7  --   --   --  2.5   GFR: Estimated Creatinine Clearance: 100.9 mL/min (by C-G formula based on SCr of 0.73 mg/dL). Recent Labs  Lab 02/21/24 1532 02/23/24 0639 02/27/24 0043 02/28/24 0602  WBC 4.9 14.9* 9.0 11.2*    Liver Function  Tests: No results for input(s): AST, ALT, ALKPHOS, BILITOT, PROT, ALBUMIN  in the last 168 hours. No results for input(s): LIPASE, AMYLASE in the last 168 hours. No results for input(s): AMMONIA in the last 168 hours.  ABG    Component Value Date/Time   PHART 7.517 (H) 02/26/2024 1802   PCO2ART 28.1 (L) 02/26/2024 1802   PO2ART 127 (H) 02/26/2024 1802   HCO3 22.8 02/26/2024 1802   TCO2 24 02/26/2024 1802   ACIDBASEDEF 6.0 (H) 02/21/2024 1229   O2SAT 99 02/26/2024 1802     Coagulation Profile: No results for input(s): INR, PROTIME in the last 168 hours.  Cardiac Enzymes: No results for input(s): CKTOTAL, CKMB, CKMBINDEX, TROPONINI in the last 168 hours.  HbA1C: No results found for: HGBA1C  CBG: No results for input(s): GLUCAP in the last 168 hours.   JD Emilio RIGGERS [facility] 02/28/2024, 10:54 AM  Please see [email or web address] for pager details.  From 7A-7P if no response, please call [phone]. After hours, please call ELink [phone].

## 2024-02-28 NOTE — Evaluation (Signed)
 Occupational Therapy Evaluation Patient Details Name: Alexa [name] MRN: [id number] DOB: [birth year] Today's Date: 02/28/2024   History of Present Illness   53 y.o. female presents to [facility] hospital on 02/21/2024 with a recurrent meningioma. Pt underwent crani with tumor resection on 7/23. MRI on 7/24 demonstrates remaining tumor. Pt underwent subsequent craniotomy with meningioma resection on 7/28. PMH includes social anxiety disorder, MDD, GERD, HTN.     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 Lives with parent and her sister. Pt currently needing up to C[facility] for ADLs, supervision for bed mobility and supervision for transfers without AD. Pt reports mild cognitive deficits, primarily slowed processing and impaired memory noted. Pt presenting with impairments listed below, will follow acutely. Recommend OP OT at d/c pending progression.     If plan is discharge home, recommend the following:   A little help with walking and/or transfers;A little help with bathing/dressing/bathroom;Assistance with cooking/housework;Supervision due to cognitive status;Assist for transportation     Functional Status Assessment   Patient has had a recent decline in their functional status and demonstrates the ability to make significant improvements in function in a reasonable and predictable amount of time.     Equipment Recommendations   None recommended by OT     Recommendations for Other Services   PT consult     Precautions/Restrictions   Precautions Precautions: Fall Recall of Precautions/Restrictions: Intact Restrictions Weight Bearing Restrictions Per Provider Order: No     Mobility Bed Mobility Overal bed mobility: Needs Assistance Bed Mobility: Supine to Sit, Sit to Supine     Supine to sit: Supervision          Transfers Overall transfer level: Needs assistance Equipment used: None Transfers: Sit to/from  Stand Sit to Stand: Supervision                  Balance Overall balance assessment: Needs assistance Sitting-balance support: No upper extremity supported, Feet supported Sitting balance-Leahy Scale: Good     Standing balance support: No upper extremity supported, During functional activity Standing balance-Leahy Scale: Fair                             ADL either performed or assessed with clinical judgement   ADL Overall ADL's : Needs assistance/impaired Eating/Feeding: Set up;Sitting   Grooming: Set up;Sitting   Upper Body Bathing: Contact guard assist;Sitting;Standing   Lower Body Bathing: Contact guard assist;Sitting/lateral leans;Sit to/from stand   Upper Body Dressing : Contact guard assist;Sitting;Standing   Lower Body Dressing: Contact guard assist;Sitting/lateral leans;Sit to/from stand   Toilet Transfer: Contact guard assist;Ambulation   Toileting- Clothing Manipulation and Hygiene: Contact guard assist       Functional mobility during ADLs: Contact guard assist       Vision Baseline Vision/History: 1 Wears glasses Vision Assessment?: No apparent visual deficits     Perception Perception: Not tested       Praxis Praxis: Not tested       Pertinent Vitals/Pain Pain Assessment Pain Assessment: No/denies pain     Extremity/Trunk Assessment Upper Extremity Assessment Upper Extremity Assessment: Overall WFL for tasks assessed   Lower Extremity Assessment Lower Extremity Assessment: Defer to PT evaluation   Cervical / Trunk Assessment Cervical / Trunk Assessment: Normal   Communication Communication Communication: No apparent difficulties   Cognition Arousal: Alert Behavior During Therapy: WFL for tasks assessed/performed Cognition: Cognition impaired  Memory impairment (select all impairments): Short-term memory     OT - Cognition Comments: slowed processing, reports difficulty with reading and translating  meaning of what she read                 Following commands: Intact       Cueing  General Comments   Cueing Techniques: Verbal cues  VSS   Exercises     Shoulder Instructions      Home Living Family/patient expects to be discharged to:: Private residence Living Arrangements: Parent;Other relatives (sister) Available Help at Discharge: Family;Available 24 hours/day Type of Home: House Home Access: Ramped entrance     Home Layout: One level     Bathroom Shower/Tub: Producer, television/film/video: Standard     Home Equipment: Agricultural consultant (2 wheels);Cane - single point          Prior Functioning/Environment Prior Level of Function : Independent/Modified Independent;Working/employed;Driving               ADLs Comments: 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    OT Problem List: Decreased activity tolerance;Decreased cognition   OT Treatment/Interventions: Self-care/ADL training;Therapeutic exercise;Energy conservation;DME and/or AE instruction;Therapeutic activities;Patient/family education;Balance training;Cognitive remediation/compensation;Visual/perceptual remediation/compensation      OT Goals(Current goals can be found in the care plan section)   Acute Rehab OT Goals Patient Stated Goal: to transfer to stepdown unit OT Goal Formulation: With patient Time For Goal Achievement: 03/13/24 Potential to Achieve Goals: Good ADL Goals Pt Will Perform Tub/Shower Transfer: Tub transfer;Shower transfer;Independently;ambulating Additional ADL Goal #1: pt will follow 3 step commands in prep for ADLs Additional ADL Goal #2: pt will recieve passing score on higher level cognitive assessment in prep for IADLs   OT Frequency:  Min 1X/week    Co-evaluation              AM-PAC OT 6 Clicks Daily Activity     Outcome Measure Help from another person eating meals?: None Help from another person taking care of personal grooming?:  None Help from another person toileting, which includes using toliet, bedpan, or urinal?: None Help from another person bathing (including washing, rinsing, drying)?: None Help from another person to put on and taking off regular upper body clothing?: None Help from another person to put on and taking off regular lower body clothing?: None 6 Click Score: 24   End of Session Equipment Utilized During Treatment: Gait belt Nurse Communication: Mobility status  Activity Tolerance: Patient tolerated treatment well Patient left: in chair;with call bell/phone within reach;with chair alarm set  OT Visit Diagnosis: Unsteadiness on feet (R26.81);Other abnormalities of gait and mobility (R26.89);Muscle weakness (generalized) (M62.81)                Time: 9147-9077 OT Time Calculation (min): 30 min Charges:  OT General Charges $OT Visit: 1 Visit OT Evaluation $OT Eval Moderate Complexity: 1 Mod OT Treatments $Self Care/Home Management : 8-22 mins  Jerren Flinchbaugh K, OTD, OTR/L SecureChat [facility]erred Acute Rehab [phone]   Laneta POUR Koonce 02/28/2024, 10:11 AM

## 2024-02-29 ENCOUNTER — Other Ambulatory Visit (HOSPITAL_COMMUNITY): Payer: Self-pay

## 2024-02-29 LAB — GLUCOSE, CAPILLARY
Glucose-Capillary: 128 mg/dL — ABNORMAL HIGH (ref 70–99)
Glucose-Capillary: 149 mg/dL — ABNORMAL HIGH (ref 70–99)

## 2024-02-29 MED ORDER — HYDROCODONE-ACETAMINOPHEN 5-325 MG PO TABS
1.0000 | ORAL_TABLET | Freq: Four times a day (QID) | ORAL | 0 refills | Status: DC | PRN
  Filled 2024-02-29: qty 30, 8d supply, fill #0

## 2024-02-29 NOTE — Discharge Instructions (Signed)
 Craniotomy Care After Please read the instructions outlined below and refer to this sheet in the next few weeks. These discharge instructions provide you with general information on caring for yourself after you leave the hospital. Your surgeon may also give you specific instructions. While your treatment has been planned according to the most current medical practices available, unavoidable complications occasionally occur. If you have any problems or questions after discharge, please call your surgeon. Although there are many types of brain surgery, recovery following craniotomy (surgical opening of the skull) is much the same for each. However, recovery depends on many factors. These include the type and severity of brain injury and the type of surgery. It also depends on any nervous system function problems (neurological deficits) before surgery. If the craniotomy was done for cancer, chemotherapy and radiation could follow. You could be in the hospital from 5 days to a couple weeks. This depends on the type of surgery, findings, and whether there are complications. HOME CARE INSTRUCTIONS   It is not unusual to hear a clicking noise after a craniotomy, the plates and screws used to attach the bone flap can sometimes cause this. It is a normal occurrence if this does happen  Do not drive for 10 days after the operation  Your scalp may feel spongy for a while, because of fluid under it. This will gradually get better. Occasionally, the surgeon will not replace the bone that was removed to access the brain. If there is a bony defect, the surgeon will ask you to wear a helmet for protection. This is a discussion you should have with your surgeon prior to leaving the hospital (discharge).  Numbness may persist in some areas of your scalp.  Take all medications as directed. Sometimes steroids to control swelling are prescribed. Anticonvulsants to prevent seizures may also be given. Do not use alcohol,  other drugs, or medications unless your surgeon says it is OK.  Keep the wound dry and clean. The wound may be washed gently with soap and water. Then, you may gently blot or dab it dry, without rubbing. Do not take baths, use swimming pools or hot tubs for 10 days, or as instructed by your caregiver. It is best to wait to see you surgeon at your first postoperative visit, and to get directions at that time.  Only take over-the-counter or prescription medicines for pain, discomfort, or fever as directed by your caregiver.  You may continue your normal diet, as directed.  Walking is OK for exercise. Wait at least 3 months before you return to mild, non-contact sports or as your surgeon suggests. Contact sports should be avoided for at least 1 year, unless your surgeon says it is OK.  If you are prescribed steroids, take them exactly as prescribed. If you start having a decrease in nervous system functions (neurological deficits) and headaches as the dose of steroids is reduced, tell your surgeon right away.  When the anticonvulsant prescription is finished you no longer need to take it. SEEK IMMEDIATE MEDICAL CARE IF:   You develop nausea, vomiting, severe headaches, confusion, or you have a seizure.  You develop chest pain, a stiff neck, or difficulty breathing.  There is redness, swelling, or increasing pain in the wound or pin insertion sites.  You have an increase in swelling or bruising around the eyes.  There is drainage or pus coming from the wound.  You have an oral temperature above 102 F (38.9 C), not controlled by medicine.  You notice a foul smell coming from the wound or dressing.  The wound breaks open (edges not staying together) after the stitches have been removed.  You develop dizziness or fainting while standing.  You develop a rash.  You develop any reaction or side effects to the medications given. Document Released: 10/18/2005 Document Revised: 10/10/2011  Document Reviewed: 07/27/2009 [facility] Patient Information 2013 [place], [place].

## 2024-02-29 NOTE — Progress Notes (Signed)
 Physical Therapy Treatment Patient Details Name: Alexa [name] MRN: [id number] DOB: [birth year] Today's Date: 02/29/2024   History of Present Illness 53 y.o. female presents to [facility] hospital on 02/21/2024 with a recurrent meningioma. Pt underwent crani with tumor resection on 7/23. MRI on 7/24 demonstrates remaining tumor. Pt underwent subsequent craniotomy with meningioma resection on 7/28. Redo crani on 7/29. PMH includes social anxiety disorder, MDD, GERD, HTN.   PT Comments  Pt in bed upon arrival and agreeable to PT session. Pt eager for mobility with only complaint being brain fog. Cognition not formally tested, however, pt able to follow commands with no impairments in memory during session. Able to progress by being independent for bed mobility and ModI to stand with no AD. Also able to increase gait distance to 464ft with no AD and supervision for safety. Worked on LE strength and dynamic balance in today's session. Pt feels comfortable discharging home whenever medically stable with assist from family. Acute PT to continue to follow.     If plan is discharge home, recommend the following: Assistance with cooking/housework;Assist for transportation;Help with stairs or ramp for entrance   Can travel by private vehicle      Yes  Equipment Recommendations  None recommended by PT       Precautions / Restrictions Precautions Precautions: Fall Recall of Precautions/Restrictions: Intact Restrictions Weight Bearing Restrictions Per Provider Order: No     Mobility  Bed Mobility Overal bed mobility: Needs Assistance Bed Mobility: Supine to Sit, Sit to Supine    Supine to sit: Independent        Transfers Overall transfer level: Needs assistance Equipment used: None Transfers: Sit to/from Stand Sit to Stand: Modified independent (Device/Increase time)       Ambulation/Gait Ambulation/Gait assistance: Supervision Gait Distance (Feet): 400 Feet Assistive device: None Gait  Pattern/deviations: Step-through pattern, Decreased stride length Gait velocity: decr    General Gait Details: slow and steady gait, no overt LOB    Balance Overall balance assessment: Needs assistance Sitting-balance support: No upper extremity supported, Feet supported Sitting balance-Leahy Scale: Good     Standing balance support: No upper extremity supported, During functional activity Standing balance-Leahy Scale: Fair    High Level Balance Comments: horizontal and vertical head turns x10, stepping over obstacle x10       Communication Communication Communication: No apparent difficulties  Cognition Arousal: Alert Behavior During Therapy: WFL for tasks assessed/performed   PT - Cognitive impairments: No apparent impairments    PT - Cognition Comments: pt reported brain fog, cognition not formally assessed. WFL for following commands and memory during session Following commands: Intact      Cueing Cueing Techniques: Verbal cues  Exercises Other Exercises Other Exercises: Bx10 step-ups with 1HR and BUE on handrails        Pertinent Vitals/Pain Pain Assessment Pain Assessment: No/denies pain     PT Goals (current goals can now be found in the care plan section) Acute Rehab PT Goals PT Goal Formulation: With patient Time For Goal Achievement: 03/12/24 Potential to Achieve Goals: Good Progress towards PT goals: Progressing toward goals    Frequency    Min 2X/week       AM-PAC PT 6 Clicks Mobility   Outcome Measure  Help needed turning from your back to your side while in a flat bed without using bedrails?: None Help needed moving from lying on your back to sitting on the side of a flat bed without using bedrails?: None Help needed moving to  and from a bed to a chair (including a wheelchair)?: A Little Help needed standing up from a chair using your arms (e.g., wheelchair or bedside chair)?: None Help needed to walk in hospital room?: A Little Help  needed climbing 3-5 steps with a railing? : A Little 6 Click Score: 21    End of Session Equipment Utilized During Treatment: Gait belt Activity Tolerance: Patient tolerated treatment well Patient left: in bed;with call bell/phone within reach Nurse Communication: Mobility status PT Visit Diagnosis: Other abnormalities of gait and mobility (R26.89);Other symptoms and signs involving the nervous system (R29.898)     Time: 9050-8996 PT Time Calculation (min) (ACUTE ONLY): 14 min  Charges:    $Gait Training: 8-22 mins PT General Charges $$ ACUTE PT VISIT: 1 Visit                     Alexa [name], PT, DPT Secure Chat Preferred  Rehab Office [phone]   Alexa [name] 02/29/2024, 11:29 AM

## 2024-02-29 NOTE — TOC Transition Note (Signed)
 Transition of Care [facility]) - Discharge Note   Patient Details  Name: Alexa [name] MRN: [id number] Date of Birth: 10/05/1970  Transition of Care [facility] CM/SW Contact:  Andrez JULIANNA George, RN Phone Number: 02/29/2024, 3:31 PM   Clinical Narrative:     PCP: Dr Vernadine Pt is discharging home with community OT through [facility]. Information on the AVS. [facility] will contact her for the first home visit.  Pt lives with sister who can provide needed supervision and transportation.  Final next level of care: OP Rehab Barriers to Discharge: No Barriers Identified   Patient Goals and CMS Choice     Choice offered to / list presented to : Patient      Discharge Placement                       Discharge Plan and Services Additional resources added to the After Visit Summary for                                       Social Drivers of Health (SDOH) Interventions SDOH Screenings   Food Insecurity: No Food Insecurity (02/23/2024)  Housing: Low Risk  (02/23/2024)  Transportation Needs: No Transportation Needs (02/23/2024)  Utilities: Not At Risk (02/23/2024)  Tobacco Use: Low Risk  (02/26/2024)     Readmission Risk Interventions     No data to display

## 2024-02-29 NOTE — Anesthesia Postprocedure Evaluation (Signed)
 Anesthesia Post Note  Patient: Alexa [name]  Procedure(s) Performed: CRANIOTOMY TUMOR EXCISION (Left)     Patient location during evaluation: PACU Anesthesia Type: General Level of consciousness: awake and alert Pain management: pain level controlled Vital Signs Assessment: post-procedure vital signs reviewed and stable Respiratory status: spontaneous breathing, nonlabored ventilation, respiratory function stable and patient connected to nasal cannula oxygen Cardiovascular status: blood pressure returned to baseline and stable Postop Assessment: no apparent nausea or vomiting Anesthetic complications: no   There were no known notable events for this encounter.        Alexa [name]

## 2024-03-01 LAB — SURGICAL PATHOLOGY

## 2024-03-17 ENCOUNTER — Encounter (HOSPITAL_COMMUNITY): Payer: Self-pay

## 2024-03-17 ENCOUNTER — Inpatient Hospital Stay (HOSPITAL_COMMUNITY)
Admission: EM | Admit: 2024-03-17 | Discharge: 2024-03-26 | DRG: 033 | Disposition: A | Discharge status: Home Health | Attending: Neurosurgery | Admitting: Neurosurgery

## 2024-03-17 ENCOUNTER — Other Ambulatory Visit: Payer: Self-pay

## 2024-03-17 DIAGNOSIS — K5900 Constipation, unspecified: Secondary | ICD-10-CM | POA: Diagnosis not present

## 2024-03-17 DIAGNOSIS — Z7985 Long-term (current) use of injectable non-insulin antidiabetic drugs: Secondary | ICD-10-CM | POA: Diagnosis not present

## 2024-03-17 DIAGNOSIS — Z91018 Allergy to other foods: Secondary | ICD-10-CM | POA: Diagnosis not present

## 2024-03-17 DIAGNOSIS — Y838 Other surgical procedures as the cause of abnormal reaction of the patient, or of later complication, without mention of misadventure at the time of the procedure: Secondary | ICD-10-CM | POA: Diagnosis present

## 2024-03-17 DIAGNOSIS — Z6831 Body mass index (BMI) 31.0-31.9, adult: Secondary | ICD-10-CM

## 2024-03-17 DIAGNOSIS — Z8041 Family history of malignant neoplasm of ovary: Secondary | ICD-10-CM

## 2024-03-17 DIAGNOSIS — I10 Essential (primary) hypertension: Secondary | ICD-10-CM | POA: Diagnosis not present

## 2024-03-17 DIAGNOSIS — Z808 Family history of malignant neoplasm of other organs or systems: Secondary | ICD-10-CM

## 2024-03-17 DIAGNOSIS — Z833 Family history of diabetes mellitus: Secondary | ICD-10-CM | POA: Diagnosis not present

## 2024-03-17 DIAGNOSIS — F32A Depression, unspecified: Secondary | ICD-10-CM | POA: Diagnosis not present

## 2024-03-17 DIAGNOSIS — E669 Obesity, unspecified: Secondary | ICD-10-CM | POA: Diagnosis present

## 2024-03-17 DIAGNOSIS — G9782 Other postprocedural complications and disorders of nervous system: Secondary | ICD-10-CM | POA: Diagnosis present

## 2024-03-17 DIAGNOSIS — K219 Gastro-esophageal reflux disease without esophagitis: Secondary | ICD-10-CM | POA: Diagnosis present

## 2024-03-17 DIAGNOSIS — Z8249 Family history of ischemic heart disease and other diseases of the circulatory system: Secondary | ICD-10-CM | POA: Diagnosis not present

## 2024-03-17 DIAGNOSIS — G9600 Cerebrospinal fluid leak, unspecified: Secondary | ICD-10-CM | POA: Diagnosis not present

## 2024-03-17 DIAGNOSIS — Z841 Family history of disorders of kidney and ureter: Secondary | ICD-10-CM

## 2024-03-17 DIAGNOSIS — R202 Paresthesia of skin: Secondary | ICD-10-CM | POA: Diagnosis not present

## 2024-03-17 DIAGNOSIS — Z86011 Personal history of benign neoplasm of the brain: Secondary | ICD-10-CM | POA: Diagnosis not present

## 2024-03-17 DIAGNOSIS — F419 Anxiety disorder, unspecified: Secondary | ICD-10-CM | POA: Diagnosis present

## 2024-03-17 DIAGNOSIS — Z79899 Other long term (current) drug therapy: Secondary | ICD-10-CM | POA: Diagnosis not present

## 2024-03-17 DIAGNOSIS — D320 Benign neoplasm of cerebral meninges: Secondary | ICD-10-CM | POA: Diagnosis not present

## 2024-03-17 DIAGNOSIS — G9608 Other cranial cerebrospinal fluid leak: Principal | ICD-10-CM | POA: Diagnosis present

## 2024-03-17 DIAGNOSIS — R2689 Other abnormalities of gait and mobility: Secondary | ICD-10-CM | POA: Diagnosis not present

## 2024-03-17 DIAGNOSIS — F418 Other specified anxiety disorders: Secondary | ICD-10-CM | POA: Diagnosis not present

## 2024-03-17 DIAGNOSIS — G932 Benign intracranial hypertension: Secondary | ICD-10-CM | POA: Diagnosis not present

## 2024-03-17 DIAGNOSIS — M419 Scoliosis, unspecified: Secondary | ICD-10-CM | POA: Diagnosis present

## 2024-03-17 LAB — COMPREHENSIVE METABOLIC PANEL WITH GFR
ALT: 12 U/L (ref 0–44)
AST: 14 U/L — ABNORMAL LOW (ref 15–41)
Albumin: 3.5 g/dL (ref 3.5–5.0)
Alkaline Phosphatase: 63 U/L (ref 38–126)
Anion gap: 8 (ref 5–15)
BUN: 6 mg/dL (ref 6–20)
CO2: 23 mmol/L (ref 22–32)
Calcium: 9.5 mg/dL (ref 8.9–10.3)
Chloride: 105 mmol/L (ref 98–111)
Creatinine, Ser: 0.96 mg/dL (ref 0.44–1.00)
GFR, Estimated: >60 mL/min (ref >60)
Glucose, Bld: 111 mg/dL — ABNORMAL HIGH (ref 70–99)
Potassium: 3.9 mmol/L (ref 3.5–5.1)
Sodium: 136 mmol/L (ref 135–145)
Total Bilirubin: 0.4 mg/dL (ref 0.0–1.2)
Total Protein: 7.4 g/dL (ref 6.5–8.1)

## 2024-03-17 LAB — CBC WITH DIFFERENTIAL/PLATELET
Abs Immature Granulocytes: 0.01 K/uL (ref 0.00–0.07)
Basophils Absolute: 0 K/uL (ref 0.0–0.1)
Basophils Relative: 1 %
Eosinophils Absolute: 0.1 K/uL (ref 0.0–0.5)
Eosinophils Relative: 4 %
HCT: 33.2 % — ABNORMAL LOW (ref 36.0–46.0)
Hemoglobin: 10.7 g/dL — ABNORMAL LOW (ref 12.0–15.0)
Immature Granulocytes: 0 %
Lymphocytes Relative: 43 %
Lymphs Abs: 1.5 K/uL (ref 0.7–4.0)
MCH: 28.9 pg (ref 26.0–34.0)
MCHC: 32.2 g/dL (ref 30.0–36.0)
MCV: 89.7 fL (ref 80.0–100.0)
Monocytes Absolute: 0.4 K/uL (ref 0.1–1.0)
Monocytes Relative: 11 %
Neutro Abs: 1.5 K/uL — ABNORMAL LOW (ref 1.7–7.7)
Neutrophils Relative %: 41 %
Platelets: 440 K/uL — ABNORMAL HIGH (ref 150–400)
RBC: 3.7 MIL/uL — ABNORMAL LOW (ref 3.87–5.11)
RDW: 12.7 % (ref 11.5–15.5)
WBC: 3.5 K/uL — ABNORMAL LOW (ref 4.0–10.5)
nRBC: 0 % (ref 0.0–0.2)

## 2024-03-17 LAB — HIV ANTIBODY (ROUTINE TESTING W REFLEX): HIV Screen 4th Generation wRfx: NONREACTIVE

## 2024-03-17 LAB — SURGICAL PCR SCREEN
MRSA, PCR: NEGATIVE
Staphylococcus aureus: POSITIVE — AB

## 2024-03-17 LAB — APTT: aPTT: 29 s (ref 24–36)

## 2024-03-17 LAB — PROTIME-INR
INR: 0.9 (ref 0.8–1.2)
Prothrombin Time: 13.1 s (ref 11.4–15.2)

## 2024-03-17 MED ORDER — CHLORHEXIDINE GLUCONATE CLOTH 2 % EX PADS
6.0000 | MEDICATED_PAD | Freq: Every day | CUTANEOUS | Status: DC
  Administered 2024-03-17 – 2024-03-25 (×10): 6 via TOPICAL

## 2024-03-17 MED ORDER — SENNOSIDES-DOCUSATE SODIUM 8.6-50 MG PO TABS
1.0000 | ORAL_TABLET | Freq: Every evening | ORAL | Status: DC | PRN
  Filled 2024-03-17: qty 1

## 2024-03-17 MED ORDER — ACETAMINOPHEN 650 MG RE SUPP: 650.0000 mg | Freq: Four times a day (QID) | RECTAL | Status: DC | PRN

## 2024-03-17 MED ORDER — HEPARIN SODIUM (PORCINE) 5000 UNIT/ML IJ SOLN: 5000.0000 [IU] | Freq: Three times a day (TID) | INTRAMUSCULAR | Status: DC

## 2024-03-17 MED ORDER — MORPHINE SULFATE (PF) 2 MG/ML IV SOLN: 2.0000 mg | INTRAVENOUS | Status: DC | PRN

## 2024-03-17 MED ORDER — ORAL CARE MOUTH RINSE: 15.0000 mL | OROMUCOSAL | Status: DC | PRN

## 2024-03-17 MED ORDER — BUPROPION HCL ER (SR) 150 MG PO TB12
150.0000 mg | ORAL_TABLET | Freq: Every day | ORAL | Status: DC
  Administered 2024-03-18 – 2024-03-26 (×8): 150 mg via ORAL
  Filled 2024-03-17 (×10): qty 1

## 2024-03-17 MED ORDER — MAGNESIUM CITRATE PO SOLN: 1.0000 | Freq: Once | ORAL | Status: DC | PRN

## 2024-03-17 MED ORDER — BUPROPION HCL ER (SR) 150 MG PO TB12: 150.0000 mg | ORAL_TABLET | Freq: Every day | ORAL | Status: DC

## 2024-03-17 MED ORDER — ONDANSETRON HCL 4 MG PO TABS
4.0000 mg | ORAL_TABLET | Freq: Four times a day (QID) | ORAL | Status: DC | PRN
  Administered 2024-03-19: 4 mg via ORAL
  Filled 2024-03-17: qty 1

## 2024-03-17 MED ORDER — HYDROCHLOROTHIAZIDE 12.5 MG PO TABS
12.5000 mg | ORAL_TABLET | Freq: Every day | ORAL | Status: DC
  Administered 2024-03-18 – 2024-03-19 (×2): 12.5 mg via ORAL
  Filled 2024-03-17 (×2): qty 1

## 2024-03-17 MED ORDER — HYDROCHLOROTHIAZIDE 12.5 MG PO TABS: 12.5000 mg | ORAL_TABLET | Freq: Every day | ORAL | Status: DC

## 2024-03-17 MED ORDER — ESCITALOPRAM OXALATE 10 MG PO TABS
40.0000 mg | ORAL_TABLET | Freq: Every day | ORAL | Status: DC
  Administered 2024-03-18 – 2024-03-26 (×8): 40 mg via ORAL
  Filled 2024-03-17 (×8): qty 4

## 2024-03-17 MED ORDER — MUPIROCIN 2 % EX OINT
1.0000 | TOPICAL_OINTMENT | Freq: Two times a day (BID) | CUTANEOUS | Status: AC
  Administered 2024-03-17 – 2024-03-22 (×9): 1 via NASAL
  Filled 2024-03-17: qty 22

## 2024-03-17 MED ORDER — BISACODYL 5 MG PO TBEC: 5.0000 mg | DELAYED_RELEASE_TABLET | Freq: Every day | ORAL | Status: DC | PRN

## 2024-03-17 MED ORDER — ATENOLOL 50 MG PO TABS: 50.0000 mg | ORAL_TABLET | Freq: Every day | ORAL | Status: DC

## 2024-03-17 MED ORDER — ONDANSETRON HCL 4 MG/2ML IJ SOLN: 4.0000 mg | Freq: Four times a day (QID) | INTRAMUSCULAR | Status: DC | PRN

## 2024-03-17 MED ORDER — ATENOLOL 50 MG PO TABS
50.0000 mg | ORAL_TABLET | Freq: Every day | ORAL | Status: DC
  Administered 2024-03-18 – 2024-03-19 (×2): 50 mg via ORAL
  Filled 2024-03-17 (×3): qty 1

## 2024-03-17 MED ORDER — ACETAMINOPHEN 325 MG PO TABS
650.0000 mg | ORAL_TABLET | Freq: Four times a day (QID) | ORAL | Status: DC | PRN
  Administered 2024-03-19 – 2024-03-26 (×10): 650 mg via ORAL
  Filled 2024-03-17 (×11): qty 2

## 2024-03-17 MED ORDER — ESCITALOPRAM OXALATE 10 MG PO TABS: 40.0000 mg | ORAL_TABLET | Freq: Every day | ORAL | Status: DC

## 2024-03-17 MED ORDER — SENNA 8.6 MG PO TABS
1.0000 | ORAL_TABLET | Freq: Two times a day (BID) | ORAL | Status: DC
  Administered 2024-03-17 – 2024-03-21 (×5): 8.6 mg via ORAL
  Filled 2024-03-17 (×7): qty 1

## 2024-03-17 MED ORDER — HYDROCODONE-ACETAMINOPHEN 5-325 MG PO TABS
1.0000 | ORAL_TABLET | ORAL | Status: DC | PRN
  Administered 2024-03-19 – 2024-03-20 (×3): 1 via ORAL
  Administered 2024-03-21 – 2024-03-23 (×7): 2 via ORAL
  Administered 2024-03-24: 1 via ORAL
  Administered 2024-03-24 – 2024-03-26 (×5): 2 via ORAL
  Filled 2024-03-17 (×2): qty 2
  Filled 2024-03-17 (×2): qty 1
  Filled 2024-03-17 (×2): qty 2
  Filled 2024-03-17: qty 1
  Filled 2024-03-17 (×6): qty 2
  Filled 2024-03-17: qty 1
  Filled 2024-03-17 (×2): qty 2

## 2024-03-17 NOTE — ED Notes (Signed)
 Replaced patients vitals bandage with new 4x4's

## 2024-03-17 NOTE — ED Provider Notes (Signed)
 [place] EMERGENCY DEPARTMENT AT [facility] Provider Note   CSN: [id number] Arrival date & time: 03/17/24  1320     Patient presents with: Post-op Problem   Alexa [name] is a 53 y.o. female.  {Add pertinent medical, surgical, social history, OB history to HPI:7914} HPI 53 year old female status postcraniotomy for meningioma presents today complaining of spinal fluid leak.  She reports that she has been followed by Dr. Gillie in the office and he had noted the leak previously.  It has recurred once that he was able to stop it.  She states that she was told that she should return if it recurred and he would be putting an ventriculostomy tube in.  She called the office today when she noted some drainage.  She was told to come here to the ED.  She denies headache, neck pain, fever, neck stiffness, new lateralized weakness.  She reports that she has had some problems with her balance since surgery.    Prior to Admission medications   Medication Sig Start Date End Date Taking? Authorizing Provider  acetaminophen  (TYLENOL ) 325 MG tablet Take 325 mg by mouth every 6 (six) hours as needed for moderate pain (pain score 4-6).    [provider]  aspirin-acetaminophen -caffeine (EXCEDRIN MIGRAINE) 250-250-65 MG tablet Take 1 tablet by mouth every 6 (six) hours as needed for headache.    [provider]  atenolol  (TENORMIN ) 50 MG tablet Take 1 tablet (50 mg total) by mouth daily. 05/21/23     BLACK COHOSH  PO Take 700 mg by mouth daily.    [provider]  buPROPion  (WELLBUTRIN  SR) 150 MG 12 hr tablet Take 1 tablet (150 mg total) by mouth daily. 10/16/23   Geralene Kaiser, MD  chlorhexidine  (PERIOGARD ) 0.12 % solution RINSE MOUTH WITH (1 CAPFUL) FOR 30 SECONDS IN THE MORNING AND EVENING AFTER TOOTHBRUSHING. EXPECTORATE AFTER RINSING, DO NOT SWALLOW. 05/27/22     Cholecalciferol  (VITAMIN D ) 50 MCG (2000 UT) CAPS Take 2 capsules (4,000 Units total) by mouth  daily. Patient not taking: Reported on 02/05/2024 11/03/21     escitalopram  (LEXAPRO ) 20 MG tablet Take 2 tablets (40 mg total) by mouth daily. 10/16/23   Geralene Kaiser, MD  hydrochlorothiazide  (HYDRODIURIL ) 12.5 MG tablet Take 1 tablet (12.5 mg total) by mouth daily. 05/21/23     HYDROcodone -acetaminophen  (NORCO/VICODIN) 5-325 MG tablet Take 1 tablet by mouth every 6 (six) hours as needed for moderate pain (pain score 4-6). 02/29/24   Cabbell, Kyle, MD  ibuprofen (ADVIL,MOTRIN) 200 MG tablet Take 200-400 mg by mouth 2 (two) times daily as needed for headache or moderate pain.    [provider]  OVER THE COUNTER MEDICATION Take 2 capsules by mouth daily. Emma supplement    [provider]  Semaglutide -Weight Management (WEGOVY ) 0.5 MG/0.5ML SOAJ Inject 0.5 mg into the skin once a week. Patient not taking: Reported on 02/05/2024 02/28/22     Semaglutide -Weight Management (WEGOVY ) 1 MG/0.5ML SOAJ Inject 1mg  under the skin once a week Patient not taking: Reported on 02/05/2024 03/24/22     Semaglutide -Weight Management (WEGOVY ) 1 MG/0.5ML SOAJ Inject 1 mg into the skin once a week. Patient not taking: Reported on 02/05/2024 09/05/22     Semaglutide -Weight Management (WEGOVY ) 1.7 MG/0.75ML SOAJ Inject 1.7 mg into the skin once a week. Patient not taking: Reported on 02/05/2024 10/25/22     Semaglutide -Weight Management (WEGOVY ) 2.4 MG/0.75ML SOAJ Inject 2.4 mg into the skin once a week. Patient not taking: Reported  on 02/05/2024 07/17/22     Semaglutide -Weight Management (WEGOVY ) 2.4 MG/0.75ML SOAJ Inject 2.4 mg into the skin once a week. Patient not taking: Reported on 02/05/2024 07/20/22     SODIUM FLUORIDE , DENTAL RINSE, (PREVIDENT ) 0.2 % SOLN Use as an oral rinse as directed on package Patient not taking: Reported on 02/05/2024 07/20/21     SODIUM FLUORIDE , DENTAL RINSE, (PREVIDENT ) 0.2 % SOLN USE AS AN ORAL RINSE, AS DIRECTED ON PACKAGE 04/21/22     SODIUM FLUORIDE , DENTAL RINSE, (PREVIDENT ) 0.2 %  SOLN USE AS AN ORAL RINSE, AS DIRECTED ON PACKAGE Patient taking differently: 1 Dose by Mouth Rinse route daily as needed. 12/13/23       Allergies: Watermelon [citrullus vulgaris]    Review of Systems  Updated Vital Signs BP (!) 135/94 (BP Location: Right Arm)   Pulse 67   Temp 98.4 F (36.9 C) (Oral)   Resp 14   Ht 1.727 m (5' 8)   Wt 95.3 kg   SpO2 95%   BMI 31.93 kg/m   Physical Exam Vitals reviewed.  HENT:     Head: Normocephalic.     Comments: Stenosis in the left occipital area with some swelling noted not warm no pus noted.  No obvious discharge although there is some clear fluid on gauze    Right Ear: External ear normal.     Left Ear: External ear normal.     Nose: Nose normal.     Mouth/Throat:     Pharynx: Oropharynx is clear.  Eyes:     Pupils: Pupils are equal, round, and reactive to light.  Cardiovascular:     Rate and Rhythm: Normal rate and regular rhythm.     Pulses: Normal pulses.  Pulmonary:     Effort: Pulmonary effort is normal.     Breath sounds: Normal breath sounds.  Abdominal:     Palpations: Abdomen is soft.  Musculoskeletal:        General: Normal range of motion.     Cervical back: Normal range of motion.  Skin:    General: Skin is warm and dry.     Capillary Refill: Capillary refill takes less than 2 seconds.  Neurological:     Mental Status: She is alert. Mental status is at baseline.  Psychiatric:        Mood and Affect: Mood normal.     (all labs ordered are listed, but only abnormal results are displayed) Labs Reviewed  COMPREHENSIVE METABOLIC PANEL WITH GFR - Abnormal; Notable for the following components:      Result Value   Glucose, Bld 111 (*)    AST 14 (*)    All other components within normal limits  CBC WITH DIFFERENTIAL/PLATELET - Abnormal; Notable for the following components:   WBC 3.5 (*)    RBC 3.70 (*)    Hemoglobin 10.7 (*)    HCT 33.2 (*)    Platelets 440 (*)    Neutro Abs 1.5 (*)    All other  components within normal limits    EKG: None  Radiology: No results found.  {Document cardiac monitor, telemetry assessment procedure when appropriate:32947} Procedures   Medications Ordered in the ED - No data to display    {Click here for ABCD2, HEART and other calculators REFRESH Note before signing:1}                              Medical Decision Making Amount  and/or Complexity of Data Reviewed Labs: ordered.   Patient with history of meningioma recent craniotomy.  She presents today with reported CSF leak.  Mild anemia improving from prior.  Complete metabolic panel reviewed and normal. Care discussed with neurosurgery APP and she reports they will be in to see patient after they finish their current OR.  {Document critical care time when appropriate  Document review of labs and clinical decision tools ie CHADS2VASC2, etc  Document your independent review of radiology images and any outside records  Document your discussion with family members, caretakers and with consultants  Document social determinants of health affecting pt's care  Document your decision making why or why not admission, treatments were needed:32947:::1}   Final diagnoses:  CSF leak    ED Discharge Orders     None

## 2024-03-17 NOTE — Consult Note (Signed)
   NAME:  Alexa [name], MRN:  [id number], DOB:  [birth year], LOS: 0 ADMISSION DATE:  03/17/2024, CONSULTATION DATE:  03/17/2024 REFERRING MD:  Rockey Peru MD, CHIEF COMPLAINT:  CSF leak   History of Present Illness:   53 year old with history of meningioma, hypertension, anxiety, depression The patient presents with cerebrospinal fluid leakage following craniotomy for meningioma.   Cerebrospinal fluid leakage - Cerebrospinal fluid leakage developed following craniotomy for meningioma in July 2025. - Initial leakage occurred at the incision site and was managed with sutures, resulting in temporary resolution. - Leakage recurred at a different location above the incision, requiring further intervention.  History of meningioma and neurosurgical interventions - Craniotomy for meningioma performed in 2017 and two additional surgeries in July 2025. - Both meningiomas were benign.  Scoliosis - Severe scoliosis present since childhood. - Concern regarding impact of scoliosis on potential lumbar procedures.  Pertinent  Medical History    has a past medical history of Anemia, Anxiety, Depression, GERD (gastroesophageal reflux disease), Headache, Hypertension, Meningioma (HCC), and PONV (postoperative nausea and vomiting).   Significant Hospital Events: Including procedures, antibiotic start and stop dates in addition to other pertinent events     Interim History / Subjective:    Objective    Blood pressure (!) 146/102, pulse 62, temperature 97.8 F (36.6 C), temperature source Oral, resp. rate 14, height 5' 8 (1.727 m), weight 95.3 kg, SpO2 100%.       No intake or output data in the 24 hours ending 03/17/24 1802 Filed Weights   03/17/24 1326  Weight: 95.3 kg    Examination: Blood pressure 121/84, pulse 76, temperature 97.8 F (36.6 C), temperature source Oral, resp. rate 16, height 5' 8 (1.727 m), weight 95.3 kg, SpO2 100%. Gen:      No acute distress HEENT:  EOMI, sclera  anicteric Neck:     No masses; no thyromegaly Lungs:    Clear to auscultation bilaterally; normal respiratory effort CV:         Regular rate and rhythm; no murmurs Abd:      + bowel sounds; soft, non-tender; no palpable masses, no distension Ext:    No edema; adequate peripheral perfusion Neuro: alert and oriented x 3 Psych: normal mood and affect   Resolved problem list   Assessment and Plan  Postoperative cerebrospinal fluid leak after craniotomy Recurrent CSF leak following craniotomy for meningioma resection.  Plan for lumbar drain placement tomorrow by neurosurgery Scoliosis may affect lumbar drain placement.  Hypertension On home hydrochlorothiazide , atenolol   Anxiety, depression Continue Wellbutrin , Lexapro   Best Practice (right click and Reselect all SmartList Selections daily)   Diet/type: Regular consistency (see orders) DVT prophylaxis prophylactic heparin   Pressure ulcer(s): N/A GI prophylaxis: N/A Lines: N/A Foley:  N/A Code Status:  full code Last date of multidisciplinary goals of care discussion []   Critical care time:    Maela Takeda MD [facility] See Amion for pager  If no response to pager , please call [phone] until 7pm After 7:00 pm call Elink  [phone] 03/17/2024, 6:22 PM

## 2024-03-17 NOTE — Progress Notes (Signed)
 eLink Physician-Brief Progress Note Patient Name: Alexa [name] DOB: Dec 06, 1970 MRN: [id number]   Date of Service  03/17/2024  HPI/Events of Note    eICU Interventions  Hold SQ heparin  for lumbar drain placement in am     Intervention Category Minor Interventions: Routine modifications to care plan (e.g. PRN medications for pain, fever)  Harden Alexa [name]. Alexa [name] 03/17/2024, 10:15 PM

## 2024-03-17 NOTE — ED Notes (Signed)
Dr. Cabbell at bedside 

## 2024-03-17 NOTE — H&P (Signed)
 Alexa [name] is an 53 y.o. female.   HPI:  53 year old female presented to the ED with a csd from her crani site. Apparently Dr. Gillie has been trying to manage this with suture closure as an outpatient. She states that is started draining again yesterday. She has had multiple surgeries for this meningioma.   Past Medical History:  Diagnosis Date   Anemia    Anxiety    Depression    GERD (gastroesophageal reflux disease)    Headache    Hypertension    Meningioma (HCC)    PONV (postoperative nausea and vomiting)     Past Surgical History:  Procedure Laterality Date   APPLICATION OF CRANIAL NAVIGATION N/A 02/21/2024   Procedure: COMPUTER-ASSISTED NAVIGATION, FOR CRANIAL PROCEDURE;  Surgeon: Gillie Duncans, MD;  Location: [facility] OR;  Service: Neurosurgery;  Laterality: N/A;   CRANIOTOMY  05/26/2016   Procedure: SUBOCCIPITAL CRANIOTOMY WITH PLACEMENT OF VENTRICULAR CATHETER;  Surgeon: Duncans Gillie, MD;  Location: [facility] OR;  Service: Neurosurgery;;  CRANIOTOMY - SUBOCCIPITAL   CRANIOTOMY Left 02/21/2024   Procedure: CRANIOTOMY TUMOR EXCISION;  Surgeon: Gillie Duncans, MD;  Location: [facility] OR;  Service: Neurosurgery;  Laterality: Left;  Occipital - left Craniotomy for tumor resection with Stealth   CRANIOTOMY Left 02/26/2024   Procedure: CRANIOTOMY TUMOR EXCISION;  Surgeon: Gillie Duncans, MD;  Location: [facility] OR;  Service: Neurosurgery;  Laterality: Left;  Craniotomy for tumor Stage II   WISDOM TOOTH EXTRACTION      Allergies  Allergen Reactions   Watermelon [Citrullus Vulgaris] Nausea And Vomiting    Social History   Tobacco Use   Smoking status: Never   Smokeless tobacco: Never  Substance Use Topics   Alcohol  use: Yes    Comment: socially, maybe a glass every few months    Family History  Problem Relation Age of Onset   Hypertension Mother    Heart disease Mother    Hypertension Father    Kidney disease Father    Ulcers Father    Diabetes Father        many paternal family members  also with diabetes   Ovarian cancer Maternal Grandmother    Cirrhosis Paternal Grandfather        alcohol  related   Thyroid  cancer Maternal Aunt    Colon cancer Neg Hx    Esophageal cancer Neg Hx    Pancreatic cancer Neg Hx    Stomach cancer Neg Hx      Review of Systems  Positive ROS: as above  All other systems have been reviewed and were otherwise negative with the exception of those mentioned in the HPI and as above.  Objective: Vital signs in last 24 hours: Temp:  [98.4 F (36.9 C)] 98.4 F (36.9 C) (08/17 1330) Pulse Rate:  [67] 67 (08/17 1330) Resp:  [14] 14 (08/17 1330) BP: (135)/(94) 135/94 (08/17 1330) SpO2:  [95 %] 95 % (08/17 1330) Weight:  [95.3 kg] 95.3 kg (08/17 1326)  General Appearance: Alert, cooperative, no distress, appears stated age Head: Normocephalic, without obvious abnormality, atraumatic, posterior swelling over incision with spinal fluid drainage Eyes: PERRL, conjunctiva/corneas clear, EOM's intact, fundi benign, both eyes      Back: Symmetric, no curvature, ROM normal, no CVA tenderness Lungs: respirations unlabored Heart: Regular rate and rhythm Abdomen: Soft, non-tender, bowel sounds active all four quadrants, no masses, no organomegaly Extremities: Extremities normal, atraumatic, no cyanosis or edema Pulses: 2+ and symmetric all extremities Skin: Skin color, texture, turgor normal, no rashes  or lesions  NEUROLOGIC:   Mental status: A&O x4, no aphasia, good attention span, Memory and fund of knowledge Motor Exam - grossly normal, normal tone and bulk Sensory Exam - grossly normal Reflexes: symmetric, no pathologic reflexes, No Hoffman's, No clonus Coordination - grossly normal Gait - not tested Balance - not tested Cranial Nerves: I: smell Not tested  II: visual acuity  OS: na    OD: na  II: visual fields Full to confrontation  II: pupils Equal, round, reactive to light  III,VII: ptosis None  III,IV,VI: extraocular muscles  Full ROM   V: mastication   V: facial light touch sensation    V,VII: corneal reflex    VII: facial muscle function - upper    VII: facial muscle function - lower   VIII: hearing   IX: soft palate elevation    IX,X: gag reflex   XI: trapezius strength    XI: sternocleidomastoid strength   XI: neck flexion strength    XII: tongue strength      Data Review Lab Results  Component Value Date   WBC 3.5 (L) 03/17/2024   HGB 10.7 (L) 03/17/2024   HCT 33.2 (L) 03/17/2024   MCV 89.7 03/17/2024   PLT 440 (H) 03/17/2024   Lab Results  Component Value Date   NA 136 03/17/2024   K 3.9 03/17/2024   CL 105 03/17/2024   CO2 23 03/17/2024   BUN 6 03/17/2024   CREATININE 0.96 03/17/2024   GLUCOSE 111 (H) 03/17/2024   No results found for: INR, PROTIME  Radiology: No results found.   Assessment/Plan: 53 year old patient comes in today with concerns of continued spinal fluid leak from her crani site. She has discussed with Dr. Gillie about having a lumbar drain placed if it continued. He is coming on call at 3:00pm so we will discuss this with him and let him take over her care.    Suzen Lacks Michaelah Credeur 03/17/2024 3:01 PM

## 2024-03-17 NOTE — ED Triage Notes (Signed)
 Pt had craniotomy on 02/21/24. Since then she has been having intermittent CSF leakage. Pt states MD sutured area and area started leaking again this morning. Pt denies pain, nausea or vomiting. Pt does have dizziness.

## 2024-03-18 ENCOUNTER — Encounter (HOSPITAL_COMMUNITY)
Admission: EM | Disposition: A | Discharge status: Home Health | Payer: Self-pay | Source: Home / Self Care | Attending: Neurosurgery

## 2024-03-18 ENCOUNTER — Other Ambulatory Visit: Payer: Self-pay

## 2024-03-18 ENCOUNTER — Inpatient Hospital Stay (HOSPITAL_COMMUNITY): Admitting: Critical Care Medicine

## 2024-03-18 ENCOUNTER — Inpatient Hospital Stay (HOSPITAL_COMMUNITY)

## 2024-03-18 ENCOUNTER — Other Ambulatory Visit: Payer: Self-pay | Admitting: Neurosurgery

## 2024-03-18 ENCOUNTER — Encounter (HOSPITAL_COMMUNITY): Payer: Self-pay | Admitting: Neurosurgery

## 2024-03-18 DIAGNOSIS — G9600 Cerebrospinal fluid leak, unspecified: Secondary | ICD-10-CM

## 2024-03-18 DIAGNOSIS — F418 Other specified anxiety disorders: Secondary | ICD-10-CM | POA: Diagnosis not present

## 2024-03-18 DIAGNOSIS — I10 Essential (primary) hypertension: Secondary | ICD-10-CM | POA: Diagnosis not present

## 2024-03-18 DIAGNOSIS — G9782 Other postprocedural complications and disorders of nervous system: Secondary | ICD-10-CM

## 2024-03-18 HISTORY — PX: LUMBAR WOUND DEBRIDEMENT: SHX1988

## 2024-03-18 HISTORY — PX: PLACEMENT OF LUMBAR DRAIN: SHX6028

## 2024-03-18 SURGERY — LUMBAR WOUND DEBRIDEMENT
Anesthesia: General | Site: Head

## 2024-03-18 MED ORDER — LACTATED RINGERS IV SOLN: INTRAVENOUS | Status: DC

## 2024-03-18 MED ORDER — CHLORHEXIDINE GLUCONATE 0.12 % MT SOLN
OROMUCOSAL | Status: AC
Start: 2024-03-18 — End: 2024-03-18
  Administered 2024-03-18: 15 mL via OROMUCOSAL
  Filled 2024-03-18: qty 15

## 2024-03-18 MED ORDER — LIDOCAINE 2% (20 MG/ML) 5 ML SYRINGE
INTRAMUSCULAR | Status: DC | PRN
  Administered 2024-03-18: 60 mg via INTRAVENOUS

## 2024-03-18 MED ORDER — DEXAMETHASONE SODIUM PHOSPHATE 10 MG/ML IJ SOLN
INTRAMUSCULAR | Status: DC | PRN
  Administered 2024-03-18: 4 mg via INTRAVENOUS

## 2024-03-18 MED ORDER — BACITRACIN ZINC 500 UNIT/GM EX OINT
TOPICAL_OINTMENT | CUTANEOUS | Status: AC
  Filled 2024-03-18: qty 28.35

## 2024-03-18 MED ORDER — SCOPOLAMINE 1 MG/3DAYS TD PT72
MEDICATED_PATCH | TRANSDERMAL | Status: AC
  Filled 2024-03-18: qty 1

## 2024-03-18 MED ORDER — ORAL CARE MOUTH RINSE: 15.0000 mL | Freq: Once | OROMUCOSAL | Status: AC

## 2024-03-18 MED ORDER — MORPHINE SULFATE (PF) 2 MG/ML IV SOLN
2.0000 mg | INTRAVENOUS | Status: DC | PRN
  Administered 2024-03-18: 2 mg via INTRAVENOUS
  Filled 2024-03-18: qty 1

## 2024-03-18 MED ORDER — ACETAMINOPHEN 10 MG/ML IV SOLN
INTRAVENOUS | Status: DC | PRN
  Administered 2024-03-18: 1000 mg via INTRAVENOUS

## 2024-03-18 MED ORDER — PHENYLEPHRINE 80 MCG/ML (10ML) SYRINGE FOR IV PUSH (FOR BLOOD PRESSURE SUPPORT)
PREFILLED_SYRINGE | INTRAVENOUS | Status: DC | PRN
  Administered 2024-03-18 (×2): 160 ug via INTRAVENOUS

## 2024-03-18 MED ORDER — PROPOFOL 10 MG/ML IV BOLUS
INTRAVENOUS | Status: DC | PRN
  Administered 2024-03-18: 150 mg via INTRAVENOUS
  Administered 2024-03-18: 150 ug/kg/min via INTRAVENOUS

## 2024-03-18 MED ORDER — BACITRACIN ZINC 500 UNIT/GM EX OINT
TOPICAL_OINTMENT | CUTANEOUS | Status: DC | PRN
  Administered 2024-03-18: 1 via TOPICAL

## 2024-03-18 MED ORDER — CHLORHEXIDINE GLUCONATE 0.12 % MT SOLN: 15.0000 mL | Freq: Once | OROMUCOSAL | Status: AC

## 2024-03-18 MED ORDER — ACETAMINOPHEN 325 MG PO TABS: 325.0000 mg | ORAL_TABLET | Freq: Four times a day (QID) | ORAL | Status: DC | PRN

## 2024-03-18 MED ORDER — AMISULPRIDE (ANTIEMETIC) 5 MG/2ML IV SOLN: 10.0000 mg | Freq: Once | INTRAVENOUS | Status: DC | PRN

## 2024-03-18 MED ORDER — CEFAZOLIN SODIUM-DEXTROSE 2-3 GM-%(50ML) IV SOLR
INTRAVENOUS | Status: DC | PRN
  Administered 2024-03-18: 2 g via INTRAVENOUS

## 2024-03-18 MED ORDER — ALBUMIN HUMAN 5 % IV SOLN: INTRAVENOUS | Status: DC | PRN

## 2024-03-18 MED ORDER — ROCURONIUM BROMIDE 10 MG/ML (PF) SYRINGE
PREFILLED_SYRINGE | INTRAVENOUS | Status: DC | PRN
  Administered 2024-03-18: 60 mg via INTRAVENOUS

## 2024-03-18 MED ORDER — ONDANSETRON HCL 4 MG/2ML IJ SOLN: 4.0000 mg | Freq: Once | INTRAMUSCULAR | Status: DC | PRN

## 2024-03-18 MED ORDER — MIDAZOLAM HCL 2 MG/2ML IJ SOLN
INTRAMUSCULAR | Status: DC | PRN
  Administered 2024-03-18: 2 mg via INTRAVENOUS

## 2024-03-18 MED ORDER — 0.9 % SODIUM CHLORIDE (POUR BTL) OPTIME
TOPICAL | Status: DC | PRN
  Administered 2024-03-18: 1000 mL

## 2024-03-18 MED ORDER — HYDROCODONE-ACETAMINOPHEN 5-325 MG PO TABS
1.0000 | ORAL_TABLET | Freq: Four times a day (QID) | ORAL | Status: DC | PRN
  Filled 2024-03-18 (×2): qty 1

## 2024-03-18 MED ORDER — FENTANYL CITRATE (PF) 250 MCG/5ML IJ SOLN
INTRAMUSCULAR | Status: DC | PRN
  Administered 2024-03-18 (×2): 50 ug via INTRAVENOUS

## 2024-03-18 MED ORDER — BLACK COHOSH 200 MG PO CAPS: 700.0000 mg | ORAL_CAPSULE | Freq: Every day | ORAL | Status: DC

## 2024-03-18 MED ORDER — PHENYLEPHRINE HCL-NACL 20-0.9 MG/250ML-% IV SOLN
INTRAVENOUS | Status: DC | PRN
  Administered 2024-03-18: 40 ug/min via INTRAVENOUS

## 2024-03-18 MED ORDER — THROMBIN 5000 UNITS EX KIT
PACK | CUTANEOUS | Status: AC
  Filled 2024-03-18: qty 2

## 2024-03-18 MED ORDER — SUGAMMADEX SODIUM 200 MG/2ML IV SOLN
INTRAVENOUS | Status: DC | PRN
  Administered 2024-03-18: 200 mg via INTRAVENOUS

## 2024-03-18 MED ORDER — MIDAZOLAM HCL 2 MG/2ML IJ SOLN
INTRAMUSCULAR | Status: AC
Start: 2024-03-18 — End: 2024-03-18
  Filled 2024-03-18: qty 2

## 2024-03-18 MED ORDER — FENTANYL CITRATE (PF) 250 MCG/5ML IJ SOLN
INTRAMUSCULAR | Status: AC
Start: 2024-03-18 — End: 2024-03-18
  Filled 2024-03-18: qty 5

## 2024-03-18 MED ORDER — FENTANYL CITRATE (PF) 100 MCG/2ML IJ SOLN: 25.0000 ug | INTRAMUSCULAR | Status: DC | PRN

## 2024-03-18 MED ORDER — ONDANSETRON HCL 4 MG/2ML IJ SOLN
INTRAMUSCULAR | Status: DC | PRN
  Administered 2024-03-18: 4 mg via INTRAVENOUS

## 2024-03-18 SURGICAL SUPPLY — 60 items
BAG COUNTER SPONGE SURGICOUNT (BAG) ×4 IMPLANT
BENZOIN TINCTURE PRP APPL 2/3 (GAUZE/BANDAGES/DRESSINGS) IMPLANT
BLADE CLIPPER SURG (BLADE) IMPLANT
BLADE SURG 11 STRL SS (BLADE) IMPLANT
CANISTER SUCTION 3000ML PPV (SUCTIONS) ×2 IMPLANT
DRAIN BAG CSF ACCUDRAIN (MISCELLANEOUS) IMPLANT
DRAIN SUBARACHNOID (WOUND CARE) IMPLANT
DRAPE C-ARM 42X72 X-RAY (DRAPES) IMPLANT
DRAPE HALF SHEET 40X57 (DRAPES) IMPLANT
DRAPE LAPAROTOMY 100X72X124 (DRAPES) ×2 IMPLANT
DRAPE SURG 17X23 STRL (DRAPES) ×2 IMPLANT
DRSG OPSITE 4X5.5 SM (GAUZE/BANDAGES/DRESSINGS) IMPLANT
DRSG OPSITE POSTOP 4X6 (GAUZE/BANDAGES/DRESSINGS) IMPLANT
DRSG TELFA 3X8 NADH STRL (GAUZE/BANDAGES/DRESSINGS) IMPLANT
DURAPREP 26ML APPLICATOR (WOUND CARE) ×2 IMPLANT
DURAPREP 6ML APPLICATOR 50/CS (WOUND CARE) ×2 IMPLANT
ELECTRODE REM PT RTRN 9FT ADLT (ELECTROSURGICAL) ×2 IMPLANT
GAUZE 4X4 16PLY ~~LOC~~+RFID DBL (SPONGE) ×2 IMPLANT
GAUZE SPONGE 4X4 12PLY STRL (GAUZE/BANDAGES/DRESSINGS) IMPLANT
GLOVE BIO SURGEON STRL SZ 6.5 (GLOVE) IMPLANT
GLOVE BIO SURGEON STRL SZ7 (GLOVE) IMPLANT
GLOVE BIO SURGEON STRL SZ7.5 (GLOVE) IMPLANT
GLOVE BIO SURGEON STRL SZ8 (GLOVE) IMPLANT
GLOVE BIO SURGEON STRL SZ8.5 (GLOVE) IMPLANT
GLOVE BIOGEL M 8.0 STRL (GLOVE) IMPLANT
GLOVE ECLIPSE 6.5 STRL STRAW (GLOVE) ×4 IMPLANT
GLOVE ECLIPSE 7.0 STRL STRAW (GLOVE) IMPLANT
GLOVE ECLIPSE 7.5 STRL STRAW (GLOVE) IMPLANT
GLOVE ECLIPSE 8.0 STRL XLNG CF (GLOVE) IMPLANT
GLOVE ECLIPSE 8.5 STRL (GLOVE) IMPLANT
GLOVE EXAM NITRILE XL STR (GLOVE) IMPLANT
GLOVE INDICATOR 6.5 STRL GRN (GLOVE) IMPLANT
GLOVE INDICATOR 7.0 STRL GRN (GLOVE) IMPLANT
GLOVE INDICATOR 7.5 STRL GRN (GLOVE) IMPLANT
GLOVE INDICATOR 8.0 STRL GRN (GLOVE) IMPLANT
GLOVE INDICATOR 8.5 STRL (GLOVE) IMPLANT
GLOVE OPTIFIT SS 8.0 STRL (GLOVE) IMPLANT
GLOVE SURG SS PI 6.5 STRL IVOR (GLOVE) IMPLANT
GOWN STRL REUS W/ TWL LRG LVL3 (GOWN DISPOSABLE) ×6 IMPLANT
GOWN STRL REUS W/ TWL XL LVL3 (GOWN DISPOSABLE) IMPLANT
GOWN STRL REUS W/TWL 2XL LVL3 (GOWN DISPOSABLE) IMPLANT
KIT BASIN OR (CUSTOM PROCEDURE TRAY) ×2 IMPLANT
KIT TURNOVER KIT B (KITS) ×2 IMPLANT
NS IRRIG 1000ML POUR BTL (IV SOLUTION) ×2 IMPLANT
PACK LAMINECTOMY NEURO (CUSTOM PROCEDURE TRAY) ×2 IMPLANT
PAD ARMBOARD POSITIONER FOAM (MISCELLANEOUS) ×6 IMPLANT
SPIKE FLUID TRANSFER (MISCELLANEOUS) ×2 IMPLANT
SPONGE SURGIFOAM ABS GEL SZ50 (HEMOSTASIS) ×2 IMPLANT
SPONGE T-LAP 4X18 ~~LOC~~+RFID (SPONGE) IMPLANT
STRIP CLOSURE SKIN 1/2X4 (GAUZE/BANDAGES/DRESSINGS) IMPLANT
SUT 3-0 BLK 1X30 PSL (SUTURE) IMPLANT
SUT SILK 3 0 TIES 10X30 (SUTURE) IMPLANT
SUT VIC AB 0 CT1 18XCR BRD8 (SUTURE) ×2 IMPLANT
SUT VIC AB 2-0 CT1 18 (SUTURE) ×2 IMPLANT
SUT VIC AB 3-0 SH 8-18 (SUTURE) ×2 IMPLANT
SWAB COLLECTION DEVICE MRSA (MISCELLANEOUS) IMPLANT
SWAB CULTURE ESWAB REG 1ML (MISCELLANEOUS) IMPLANT
TOWEL GREEN STERILE (TOWEL DISPOSABLE) ×6 IMPLANT
TOWEL GREEN STERILE FF (TOWEL DISPOSABLE) ×2 IMPLANT
WATER STERILE IRR 1000ML POUR (IV SOLUTION) ×2 IMPLANT

## 2024-03-18 NOTE — Anesthesia Preprocedure Evaluation (Addendum)
 Anesthesia Evaluation  Patient identified by MRN, date of birth, ID band Patient awake    Reviewed: Allergy & Precautions, NPO status , Patient's Chart, lab work & pertinent test results, reviewed documented beta blocker date and time   History of Anesthesia Complications (+) PONV and history of anesthetic complications  Airway Mallampati: II  TM Distance: >3 FB Neck ROM: Full    Dental  (+) Teeth Intact, Dental Advisory Given, Chipped   Pulmonary neg pulmonary ROS   Pulmonary exam normal breath sounds clear to auscultation       Cardiovascular hypertension, Pt. on home beta blockers and Pt. on medications Normal cardiovascular exam Rhythm:Regular Rate:Normal     Neuro/Psych  Headaches PSYCHIATRIC DISORDERS Anxiety Depression    S/p meningioma resection Postoperative CSF leak    GI/Hepatic Neg liver ROS,GERD  ,,  Endo/Other  negative endocrine ROS  Obesity   Renal/GU negative Renal ROS     Musculoskeletal negative musculoskeletal ROS (+)    Abdominal   Peds  Hematology  (+) Blood dyscrasia, anemia , REFUSES BLOOD PRODUCTS (accepts albumin ), JEHOVAH'S WITNESS  Anesthesia Other Findings Day of surgery medications reviewed with the patient.  Reproductive/Obstetrics                              Anesthesia Physical Anesthesia Plan  ASA: 3  Anesthesia Plan: General   Post-op Pain Management: Ofirmev  IV (intra-op)*   Induction: Intravenous  PONV Risk Score and Plan: 4 or greater and Scopolamine  patch - Pre-op, Midazolam , TIVA, Dexamethasone  and Ondansetron   Airway Management Planned: Oral ETT  Additional Equipment:   Intra-op Plan:   Post-operative Plan: Extubation in OR  Informed Consent: I have reviewed the patients History and Physical, chart, labs and discussed the procedure including the risks, benefits and alternatives for the proposed anesthesia with the patient or  authorized representative who has indicated his/her understanding and acceptance.     Dental advisory given  Plan Discussed with: CRNA  Anesthesia Plan Comments:          Anesthesia Quick Evaluation

## 2024-03-18 NOTE — Progress Notes (Signed)
 PHARMACIST - PHYSICIAN ORDER COMMUNICATION  CONCERNING: P&T Medication Policy on Herbal Medications  DESCRIPTION:  This patient's order for:  black cohosh   has been noted.  This product(s) is classified as an "herbal" or natural product. Due to a lack of definitive safety studies or FDA approval, nonstandard manufacturing practices, plus the potential risk of unknown drug-drug interactions while on inpatient medications, the Pharmacy and Therapeutics Committee does not permit the use of "herbal" or natural products of this type within [facility].   ACTION TAKEN: The pharmacy department is unable to verify this order at this time and your patient has been informed of this safety policy. Please reevaluate patient's clinical condition at discharge and address if the herbal or natural product(s) should be resumed at that time.

## 2024-03-18 NOTE — Transfer of Care (Signed)
 Immediate Anesthesia Transfer of Care Note  Patient: Alexa [name]  Procedure(s) Performed: WOUND DEBRIDEMENT (Head) PLACEMENT OF LUMBAR DRAIN  Patient Location: PACU  Anesthesia Type:General  Level of Consciousness: awake, alert , and oriented  Airway & Oxygen Therapy: Patient Spontanous Breathing and Patient connected to nasal cannula oxygen  Post-op Assessment: Report given to RN and Post -op Vital signs reviewed and stable  Post vital signs: Reviewed and stable  Last Vitals:  Vitals Value Taken Time  BP 131/73 03/18/24 20:02  Temp    Pulse 74 03/18/24 20:02  Resp 17 03/18/24 20:03  SpO2 91 % 03/18/24 20:02  Vitals shown include unfiled device data.  Last Pain:  Vitals:   03/18/24 1829  TempSrc: Oral  PainSc: 0-No pain         Complications: No notable events documented.

## 2024-03-18 NOTE — Progress Notes (Signed)
 Dr. Cabbell at bedside.  Patient c/o tingling in R foot Dr. Gillie aware no new orders at this time.  Dr. Gillie will hook up drain when patient is transported to ICU.

## 2024-03-18 NOTE — Anesthesia Postprocedure Evaluation (Signed)
 Anesthesia Post Note  Patient: Alexa [name]  Procedure(s) Performed: WOUND DEBRIDEMENT (Head) PLACEMENT OF LUMBAR DRAIN     Patient location during evaluation: PACU Anesthesia Type: General Level of consciousness: awake and alert Pain management: pain level controlled Vital Signs Assessment: post-procedure vital signs reviewed and stable Respiratory status: spontaneous breathing, nonlabored ventilation and respiratory function stable Cardiovascular status: blood pressure returned to baseline and stable Postop Assessment: no apparent nausea or vomiting Anesthetic complications: no   No notable events documented.  Last Vitals:  Vitals:   03/18/24 2030 03/18/24 2100  BP: (!) 141/93 (!) 130/95  Pulse: 74 69  Resp: (!) 9 17  Temp: (!) 36.3 C (!) 36.4 C  SpO2: 100% 100%    Last Pain:  Vitals:   03/18/24 2144  TempSrc:   PainSc: 6                  Garnette FORBES Skillern

## 2024-03-18 NOTE — Progress Notes (Signed)
   NAME:  Alexa [name], MRN:  [id number], DOB:  [birth year], LOS: 1 ADMISSION DATE:  03/17/2024, CONSULTATION DATE:  03/17/2024 REFERRING MD:  Rockey Peru MD, CHIEF COMPLAINT:  CSF leak   History of Present Illness:   53 year old with history of meningioma, hypertension, anxiety, depression The patient presents with cerebrospinal fluid leakage following craniotomy for meningioma.   Cerebrospinal fluid leakage - Cerebrospinal fluid leakage developed following craniotomy for meningioma in July 2025. - Initial leakage occurred at the incision site and was managed with sutures, resulting in temporary resolution. - Leakage recurred at a different location above the incision, requiring further intervention.  History of meningioma and neurosurgical interventions - Craniotomy for meningioma performed in 2017 and two additional surgeries in July 2025. - Both meningiomas were benign.  Scoliosis - Severe scoliosis present since childhood. - Concern regarding impact of scoliosis on potential lumbar procedures.  Pertinent  Medical History    has a past medical history of Anemia, Anxiety, Depression, GERD (gastroesophageal reflux disease), Headache, Hypertension, Meningioma (HCC), and PONV (postoperative nausea and vomiting).   Significant Hospital Events: Including procedures, antibiotic start and stop dates in addition to other pertinent events     Interim History / Subjective:    Objective    Blood pressure 110/77, pulse (!) 56, temperature 97.7 F (36.5 C), temperature source Oral, resp. rate 14, height 5' 8 (1.727 m), weight 95.3 kg, SpO2 100%.       No intake or output data in the 24 hours ending 03/18/24 0812 Filed Weights   03/17/24 1326  Weight: 95.3 kg    Examination: General resting in bed no distress HENT NCAT there is a dressing to the posterior left occipital cervical area. Currently intact.  Neuro awake and oriented no focal def. No nuchal rigidity  Pulm  clear Card rrr Abd soft  Gu voids   Resolved problem list   Assessment and Plan  Postoperative cerebrospinal fluid leak after craniotomy Recurrent CSF leak following craniotomy for meningioma resection.  -awaiting her primary  neuro surgeon to eval  Plan lumbar drainage per N-surgical team  Pain management  Glucemic control Close obs for s/sx infection   Hypertension plan On home hydrochlorothiazide , atenolol ->continue  Anxiety, depression Plan Continue Wellbutrin , Lexapro   Best Practice (right click and Reselect all SmartList Selections daily)   Diet/type: Regular consistency (see orders) DVT prophylaxis prophylactic heparin  ->placed on hold pre-drain placement  Pressure ulcer(s): N/A GI prophylaxis: N/A Lines: N/A Foley:  N/A Code Status:  full code Last date of multidisciplinary goals of care discussion []   Critical care time: NA

## 2024-03-18 NOTE — Progress Notes (Signed)
 BP 130/85 (BP Location: Right Arm)   Pulse 62   Temp 97.8 F (36.6 C) (Oral)   Resp (!) 7   Ht 5' 8 (1.727 m)   Wt 95.3 kg   SpO2 97%   BMI 31.93 kg/m  Alert and oriented x 4 Speech is clear and fluent Moving all extremities OR for lumbar drain, wound revision

## 2024-03-18 NOTE — Op Note (Signed)
 03/18/2024  8:34 PM  PATIENT:  Alexa [name]  53 y.o. female With a postoperative csf leak from her craniectomy site. I have recommended lumbar drain placement and wound revision.  PRE-OPERATIVE DIAGNOSIS:  Postoperative CSF leak  POST-OPERATIVE DIAGNOSIS:  Postoperative CSF leak  PROCEDURE:  Procedure(s): WOUND DEBRIDEMENT PLACEMENT OF LUMBAR DRAIN  SURGEON: Surgeon(s): Raeley Gilmore, MD  ASSISTANTS:none  ANESTHESIA:   general  EBL:  Total I/O In: 1050 [I.V.:800; IV Piggyback:250] Out: 20 [Blood:20]  BLOOD ADMINISTERED:none  CELL SAVER GIVEN:not used  COUNT:correct  DRAINS: lumbar drain   SPECIMEN:  No Specimen  DICTATION: Alexa [name] was taken to the operating room, intubated, and placed under a general anesthetic without difficulty. She was positioned lateral decubitus on a bean bag, left side up. Her lumbar region was prepped and draped in a sterile manner. I used a touhy needle to placed the lumbar drain. I achieved good flow from the catheter.  I then prepped the scalp, shaving it, removing the sutures, and used betadine to clean the area. I placed vertical mattress sutures to remove the pressure from the incision line and to stop the leak. I placed a sterile dressing there. And on the lumbar drain. She was rolled onto her bed and extubated.   PLAN OF CARE: Admit to inpatient   PATIENT DISPOSITION:  PACU - hemodynamically stable.   Delay start of Pharmacological VTE agent (>24hrs) due to surgical blood loss or risk of bleeding:  yes

## 2024-03-18 NOTE — Anesthesia Procedure Notes (Signed)
 Procedure Name: Intubation Date/Time: 03/18/2024 6:44 PM  Performed by: Corinne Garnette BRAVO, MDPre-anesthesia Checklist: Patient identified, Emergency Drugs available, Suction available and Patient being monitored Patient Re-evaluated:Patient Re-evaluated prior to induction Oxygen Delivery Method: Circle System Utilized Preoxygenation: Pre-oxygenation with 100% oxygen Induction Type: IV induction Ventilation: Mask ventilation without difficulty Laryngoscope Size: Mac and 3 Grade View: Grade I Tube type: Oral Tube size: 7.0 mm Number of attempts: 1 Airway Equipment and Method: Stylet and Oral airway Placement Confirmation: ETT inserted through vocal cords under direct vision, positive ETCO2 and breath sounds checked- equal and bilateral Secured at: 22 cm Tube secured with: Tape Dental Injury: Teeth and Oropharynx as per pre-operative assessment

## 2024-03-19 ENCOUNTER — Encounter (HOSPITAL_COMMUNITY): Payer: Self-pay | Admitting: Neurosurgery

## 2024-03-19 DIAGNOSIS — G9782 Other postprocedural complications and disorders of nervous system: Secondary | ICD-10-CM | POA: Diagnosis not present

## 2024-03-19 DIAGNOSIS — G9600 Cerebrospinal fluid leak, unspecified: Secondary | ICD-10-CM | POA: Diagnosis not present

## 2024-03-19 NOTE — Progress Notes (Addendum)
   NAME:  Alexa [name], MRN:  [id number], DOB:  [birth year], LOS: 2 ADMISSION DATE:  03/17/2024, CONSULTATION DATE:  03/17/2024 REFERRING MD:  Rockey Peru MD, CHIEF COMPLAINT:  CSF leak   History of Present Illness:   53 year old with history of meningioma, hypertension, anxiety, depression The patient presents with cerebrospinal fluid leakage following craniotomy for meningioma.   Cerebrospinal fluid leakage - Cerebrospinal fluid leakage developed following craniotomy for meningioma in July 2025. - Initial leakage occurred at the incision site and was managed with sutures, resulting in temporary resolution. - Leakage recurred at a different location above the incision, requiring further intervention.  History of meningioma and neurosurgical interventions - Craniotomy for meningioma performed in 2017 and two additional surgeries in July 2025. - Both meningiomas were benign.  Scoliosis - Severe scoliosis present since childhood. - Concern regarding impact of scoliosis on potential lumbar procedures.  Pertinent  Medical History    has a past medical history of Anemia, Anxiety, Depression, GERD (gastroesophageal reflux disease), Headache, Hypertension, Meningioma (HCC), and PONV (postoperative nausea and vomiting).   Significant Hospital Events: Including procedures, antibiotic start and stop dates in addition to other pertinent events   8/17 admitted 8/18 lumbar drain. Post op new right foot paresthesia   Interim History / Subjective:  New right foot paresthesia   Objective    Blood pressure 107/68, pulse (!) 59, temperature (!) 97.5 F (36.4 C), temperature source Oral, resp. rate 13, height 5' 8 (1.727 m), weight 95.3 kg, SpO2 96%.        Intake/Output Summary (Last 24 hours) at 03/19/2024 0825 Last data filed at 03/19/2024 0700 Gross per 24 hour  Intake 1150 ml  Output 1035 ml  Net 115 ml   Filed Weights   03/17/24 1326  Weight: 95.3 kg     Examination:  General resting in bed. No distress HENT NCAT no JVD. The left occipital dressing is CD&I Neuro awake oriented. Has new right foot paresthesia. Reports hurts to push/pull exam. Weaker (? D/t discomfort) compared w/ prior to drain placement. The lumbar drain has clear pink tinged CSF the dressing for the lumbar drain is CD&I. She gets adequate pain control w/ APAP Pulm clear  Card rrr no MRG Abd soft Ext warm and dry  Gu cl yellow   Resolved problem list   Assessment and Plan  Postoperative cerebrospinal fluid leak after craniotomy Recurrent CSF leak following craniotomy for meningioma resection.  -awaiting her primary  neuro surgeon to eval  Plan lumbar drainage per N-surgical team; draining 10cc/hr by nursing staff As needed Pain management  Glucemic control Close obs for s/sx infection Activity restriction per neurosurg Close obs of surgical sites   Hypertension plan On home hydrochlorothiazide , atenolol ->continue  Anxiety, depression Plan Continue Wellbutrin , Lexapro   Best Practice (right click and Reselect all SmartList Selections daily)   Diet/type: Regular consistency (see orders) DVT prophylaxis prophylactic heparin  ->placed on hold pre-drain placement  Pressure ulcer(s): N/A GI prophylaxis: N/A Lines: N/A Foley:  N/A Code Status:  full code Last date of multidisciplinary goals of care discussion []   Critical care time: NA

## 2024-03-19 NOTE — TOC CM/SW Note (Signed)
 Transition of Care [facility]) - Inpatient Brief Assessment   Patient Details  Name: Alexa [name] MRN: [id number] Date of Birth: 02-Dec-1970  Transition of Care [facility]) CM/SW Contact:    Inocente GORMAN Kindle, LCSW Phone Number: 03/19/2024, 4:16 PM   Clinical Narrative: Patient admitted from home with sister, status post craniotomy. Patient with discharge on 7/31 and was to follow up with [facility]. No current ICM needs identified but please place consult if needs arise.    Transition of Care Asessment: Insurance and Status: Insurance coverage has been reviewed Patient has primary care physician: No Home environment has been reviewed: From home Prior level of function:: Independent Prior/Current Home Services: No current home services Social Drivers of Health Review: SDOH reviewed no interventions necessary Readmission risk has been reviewed: Yes Transition of care needs: no transition of care needs at this time

## 2024-03-19 NOTE — Progress Notes (Signed)
 Patient ID: Alexa [name], female   DOB: 1971/02/08, 53 y.o.   MRN: [id number] BP 98/81   Pulse 74   Temp 98 F (36.7 C) (Oral)   Resp 16   Ht 5' 8 (1.727 m)   Wt 95.3 kg   SpO2 95%   BMI 31.93 kg/m  Alert and oriented x4 Moving all extremities Right foot tingling very likely due to irritation of nerve root(s) by drain. Thus no role for gabapentin.  Drain is working well. Dressings dry. Patient my not have drain clamped, thus may not get out of bed.

## 2024-03-19 NOTE — Progress Notes (Signed)
 BP has been a little soft Plan Will hold her diuretic for now

## 2024-03-20 DIAGNOSIS — I10 Essential (primary) hypertension: Secondary | ICD-10-CM | POA: Diagnosis not present

## 2024-03-20 DIAGNOSIS — G9600 Cerebrospinal fluid leak, unspecified: Secondary | ICD-10-CM | POA: Diagnosis not present

## 2024-03-20 LAB — CBC
HCT: 30.5 % — ABNORMAL LOW (ref 36.0–46.0)
Hemoglobin: 10 g/dL — ABNORMAL LOW (ref 12.0–15.0)
MCH: 28.7 pg (ref 26.0–34.0)
MCHC: 32.8 g/dL (ref 30.0–36.0)
MCV: 87.6 fL (ref 80.0–100.0)
Platelets: 366 K/uL (ref 150–400)
RBC: 3.48 MIL/uL — ABNORMAL LOW (ref 3.87–5.11)
RDW: 12.9 % (ref 11.5–15.5)
WBC: 4.3 K/uL (ref 4.0–10.5)
nRBC: 0 % (ref 0.0–0.2)

## 2024-03-20 LAB — BASIC METABOLIC PANEL WITH GFR
Anion gap: 8 (ref 5–15)
BUN: 19 mg/dL (ref 6–20)
CO2: 27 mmol/L (ref 22–32)
Calcium: 9.4 mg/dL (ref 8.9–10.3)
Chloride: 102 mmol/L (ref 98–111)
Creatinine, Ser: 1.07 mg/dL — ABNORMAL HIGH (ref 0.44–1.00)
GFR, Estimated: >60 mL/min (ref >60)
Glucose, Bld: 101 mg/dL — ABNORMAL HIGH (ref 70–99)
Potassium: 4.1 mmol/L (ref 3.5–5.1)
Sodium: 137 mmol/L (ref 135–145)

## 2024-03-20 NOTE — Progress Notes (Signed)
 Patient ID: Alexa [name], female   DOB: [birth year], 53 y.o.   MRN: [id number] BP 108/69   Pulse 80   Temp 98.9 F (37.2 C) (Oral)   Resp 16   Ht 5' 8 (1.727 m)   Wt 95.3 kg   SpO2 97%   BMI 31.93 kg/m  Wound dressing is dry, no csf leak Right foot still tingles and some pain. Moving all extremities well Drain working well.

## 2024-03-20 NOTE — Progress Notes (Signed)
 NAME:  Alexa [name], MRN:  [id number], DOB:  April 10, 1971, LOS: 3 ADMISSION DATE:  03/17/2024, CONSULTATION DATE:  03/17/2024 REFERRING MD:  Rockey Peru MD, CHIEF COMPLAINT:  CSF leak   History of Present Illness:   53 year old with history of meningioma, hypertension, anxiety, depression The patient presents with cerebrospinal fluid leakage following craniotomy for meningioma.   Cerebrospinal fluid leakage - Cerebrospinal fluid leakage developed following craniotomy for meningioma in July 2025. - Initial leakage occurred at the incision site and was managed with sutures, resulting in temporary resolution. - Leakage recurred at a different location above the incision, requiring further intervention.  History of meningioma and neurosurgical interventions - Craniotomy for meningioma performed in 2017 and two additional surgeries in July 2025. - Both meningiomas were benign.  Scoliosis - Severe scoliosis present since childhood. - Concern regarding impact of scoliosis on potential lumbar procedures.  Pertinent  Medical History    has a past medical history of Anemia, Anxiety, Depression, GERD (gastroesophageal reflux disease), Headache, Hypertension, Meningioma (HCC), and PONV (postoperative nausea and vomiting).   Significant Hospital Events: Including procedures, antibiotic start and stop dates in addition to other pertinent events   8/17 admitted 8/18 lumbar drain. Post op new right foot paresthesia  8/20 no issues overnight, continues to complain of persistent significant right foot paresthesias  Interim History / Subjective:  Seen sitting up in bed with no acute complaints this a.m. apart from left foot paresthesia  Objective    Blood pressure 116/84, pulse 62, temperature 98.8 F (37.1 C), temperature source Oral, resp. rate 14, height 5' 8 (1.727 m), weight 95.3 kg, SpO2 97%.        Intake/Output Summary (Last 24 hours) at 03/20/2024 0740 Last data filed at  03/20/2024 0700 Gross per 24 hour  Intake --  Output 254 ml  Net -254 ml   Filed Weights   03/17/24 1326  Weight: 95.3 kg    Examination: General: Well-appearing middle-aged female sitting up in bed in no acute distress  HEENT: [place]/AT, MM pink/moist, PERRL,  Neuro: Alert and oriented x 3, nonfocal, right foot paresthesia with associated weakness CV: s1s2 regular rate and rhythm, no murmur, rubs, or gallops,  PULM: Clear to auscultation bilaterally, no increased work of breathing, no added breath sounds GI: soft, bowel sounds active in all 4 quadrants, non-tender, non-distended, tolerating oral diet Extremities: warm/dry, no edema  Skin: no rashes or lesions  Resolved problem list   Assessment and Plan  Postoperative cerebrospinal fluid leak after craniotomy -Recurrent CSF leak following craniotomy for meningioma resection. Awaiting her primary neuro surgeon to eval  P: Primary management per neurosurgery CSF drain management per neurosurgery Neuroprotective measures Activity per neurosurgery As needed pain management  Hypertension P: Continue home atenolol  HCTZ on hold Continuous telemetry  Anxiety, depression P: Continue home Wellbutrin  and Lexapro   At risk AKI with slightly increased creatinine - GFR remains greater than 60, patient reports decreased oral intake P: Encourage oral hydration Trend BMP  Best Practice (right click and Reselect all SmartList Selections daily)   Diet/type: Regular consistency (see orders) DVT prophylaxis prophylactic heparin  ->placed on hold pre-drain placement  Pressure ulcer(s): N/A GI prophylaxis: N/A Lines: N/A Foley:  N/A Code Status:  full code Last date of multidisciplinary goals of care discussion: Per primary   Critical care time: NA  [employer] Null D. Harris, NP-C [facility] Personal contact information can be found on Amion  If no contact or response made please call [phone] 03/20/2024,  7:41  AM

## 2024-03-20 NOTE — Discharge Summary (Signed)
 Physician Discharge Summary  Patient ID: Alexa [name] MRN: [id number] DOB/AGE: 1970/09/03 53 y.o.  Admit date: 02/21/2024 Discharge date: 03/20/2024  Admission Diagnoses:meningioma, recurrent  Discharge Diagnoses: same Principal Problem:   Status post craniotomy Active Problems:   Meningioma, recurrent of [facility])   Discharged Condition: good  Hospital Course: Alexa [name] was admitted and taken to the operating room for a stereotactic guided craniotomy and tumor resection. She did well during the operation and per the navigation system it appeared that a complete resection had been achieved. However the postoperative MRI scan showed there was a significant amount of tumor left. I returned to the operating room for a repeat resection, and did achieve a total resection. Post op she did well. She had a normal neurological exam. The wound at discharge was clean, dry, and without signs of infection. She was ambulating, voiding, and tolerating a regular diet. She will be seen in the office for follow up.   Treatments: surgery: as above  Discharge Exam: Blood pressure 112/77, pulse 67, temperature 99 F (37.2 C), temperature source Oral, resp. rate 18, height 5' 8.5 (1.74 m), weight 98.9 kg, last menstrual period 12/26/2023, SpO2 99%. General appearance: alert, cooperative, appears stated age, and no distress  Disposition: Discharge disposition: 01-Home or Self Care      Brain Tumor Discharge Instructions     Ambulatory referral to Occupational Therapy   Complete by: As directed       Allergies as of 02/29/2024       Reactions   Watermelon [citrullus Vulgaris] Nausea And Vomiting        Medication List     TAKE these medications    acetaminophen  325 MG tablet Commonly known as: TYLENOL  Take 325 mg by mouth every 6 (six) hours as needed for moderate pain (pain score 4-6).   aspirin-acetaminophen -caffeine 250-250-65 MG tablet Commonly known as: EXCEDRIN  MIGRAINE Take 1 tablet by mouth every 6 (six) hours as needed for headache.   atenolol  50 MG tablet Commonly known as: TENORMIN  Take 1 tablet (50 mg total) by mouth daily.   BLACK COHOSH  PO Take 700 mg by mouth daily.   buPROPion  150 MG 12 hr tablet Commonly known as: WELLBUTRIN  SR Take 1 tablet (150 mg total) by mouth daily.   escitalopram  20 MG tablet Commonly known as: LEXAPRO  Take 2 tablets (40 mg total) by mouth daily.   hydrochlorothiazide  12.5 MG tablet Commonly known as: HYDRODIURIL  Take 1 tablet (12.5 mg total) by mouth daily.   HYDROcodone -acetaminophen  5-325 MG tablet Commonly known as: NORCO/VICODIN Take 1 tablet by mouth every 6 (six) hours as needed for moderate pain (pain score 4-6).   ibuprofen 200 MG tablet Commonly known as: ADVIL Take 200-400 mg by mouth 2 (two) times daily as needed for headache or moderate pain.   OVER THE COUNTER MEDICATION Take 2 capsules by mouth daily. Emma supplement   SODIUM FLUORIDE  (DENTAL RINSE) 0.2 % Soln Commonly known as: PreviDent  USE AS AN ORAL RINSE, AS DIRECTED ON PACKAGE What changed:  how much to take how to take this when to take this reasons to take this        Follow-up Information     Gillie Duncans, MD Follow up.   Specialty: Neurosurgery Why: call the office to have your sutures removed in approximately one week Contact information: [address] [place] [address] [phone]         Gillie Duncans, MD .   Specialty: Neurosurgery  Contact information: [address] [place] [address] [phone]         [facility] [employer] [facility] Follow up.   Why: [facility] will contact you for the first home visit. Contact information: [address] [place] [address] [place] [address] [phone]                 Signed: Rockey Peru 03/20/2024, 5:38 PM

## 2024-03-21 DIAGNOSIS — G9600 Cerebrospinal fluid leak, unspecified: Secondary | ICD-10-CM | POA: Diagnosis not present

## 2024-03-21 DIAGNOSIS — I10 Essential (primary) hypertension: Secondary | ICD-10-CM | POA: Diagnosis not present

## 2024-03-21 MED ORDER — POLYETHYLENE GLYCOL 3350 17 G PO PACK
17.0000 g | PACK | Freq: Every day | ORAL | Status: DC
  Administered 2024-03-21: 17 g via ORAL
  Filled 2024-03-21 (×2): qty 1

## 2024-03-21 NOTE — Progress Notes (Signed)
 Patient ID: Alexa [name], female   DOB: 21-Jan-1971, 53 y.o.   MRN: [id number] Wound remains dry Normal neurological exam  Will clamp drain tomorrow. BP 110/76   Pulse 72   Temp 98 F (36.7 C) (Oral)   Resp 12   Ht 5' 8 (1.727 m)   Wt 95.3 kg   SpO2 96%   BMI 31.93 kg/m

## 2024-03-21 NOTE — Progress Notes (Signed)
 NAME:  Alexa [name], MRN:  [id number], DOB:  11-05-1970, LOS: 4 ADMISSION DATE:  03/17/2024, CONSULTATION DATE:  03/17/2024 REFERRING MD:  Rockey Peru MD, CHIEF COMPLAINT:  CSF leak   History of Present Illness:   53 year old with history of meningioma, hypertension, anxiety, depression The patient presents with cerebrospinal fluid leakage following craniotomy for meningioma.   Cerebrospinal fluid leakage - Cerebrospinal fluid leakage developed following craniotomy for meningioma in July 2025. - Initial leakage occurred at the incision site and was managed with sutures, resulting in temporary resolution. - Leakage recurred at a different location above the incision, requiring further intervention.  History of meningioma and neurosurgical interventions - Craniotomy for meningioma performed in 2017 and two additional surgeries in July 2025. - Both meningiomas were benign.  Scoliosis - Severe scoliosis present since childhood. - Concern regarding impact of scoliosis on potential lumbar procedures.  Pertinent  Medical History    has a past medical history of Anemia, Anxiety, Depression, GERD (gastroesophageal reflux disease), Headache, Hypertension, Meningioma (HCC), and PONV (postoperative nausea and vomiting).   Significant Hospital Events: Including procedures, antibiotic start and stop dates in addition to other pertinent events   8/17 admitted 8/18 lumbar drain. Post op new right foot paresthesia  8/20 no issues overnight, continues to complain of persistent significant right foot paresthesias 8/21 no issues overnight, reports pain/paresthesias improved with Vicodin use  Interim History / Subjective:  States she feels well this a.m. with no acute complaints, asking when she will be able to discharge/drain removal  Objective    Blood pressure 135/79, pulse 80, temperature 99.4 F (37.4 C), temperature source Oral, resp. rate (!) 22, height 5' 8 (1.727 m), weight 95.3 kg,  SpO2 95%.        Intake/Output Summary (Last 24 hours) at 03/21/2024 1011 Last data filed at 03/21/2024 0900 Gross per 24 hour  Intake --  Output 250 ml  Net -250 ml   Filed Weights   03/17/24 1326  Weight: 95.3 kg    Examination: General: Well appearing middle-aged female sitting up in bed in no acute distress  HEENT: [place]/AT, MM pink/moist, PERRL,  Neuro: Alert and oriented x 3, nonfocal, right foot paresthesia CV: s1s2 regular rate and rhythm, no murmur, rubs, or gallops,  PULM: Clear to auscultation bilaterally, no increased work of breathing, no added breath sounds, on room air GI: soft, bowel sounds active in all 4 quadrants, non-tender, non-distended, tolerating oral diet Extremities: warm/dry, no edema  Skin: no rashes or lesions  Resolved problem list   Assessment and Plan  Postoperative cerebrospinal fluid leak after craniotomy -Recurrent CSF leak following craniotomy for meningioma resection. Awaiting her primary neuro surgeon to eval  P: Primary management per neurosurgery CSF drain per neurosurgery Neuroprotective measures Activity progression per neurosurgery Activity advancement per neurosurgery  As needed pain management   Hypertension P: Continue home atenolol  HCTZ remains on hold Continuous telemetry  Anxiety, depression P: Continue home Wellbutrin  and Lexapro   At risk AKI with slightly increased creatinine - GFR remains greater than 60, patient reports decreased oral intake P: Encourage oral hydration Trend BMP  Best Practice (right click and Reselect all SmartList Selections daily)   Diet/type: Regular consistency (see orders) DVT prophylaxis prophylactic heparin  ->placed on hold pre-drain placement  Pressure ulcer(s): N/A GI prophylaxis: N/A Lines: N/A Foley:  N/A Code Status:  full code Last date of multidisciplinary goals of care discussion: Per primary   Critical care time: NA  Kirklin Mcduffee D. [place], NP-C [facility] Personal contact information can be found on Amion  If no contact or response made please call [phone] 03/21/2024, 10:11 AM

## 2024-03-22 DIAGNOSIS — I10 Essential (primary) hypertension: Secondary | ICD-10-CM | POA: Diagnosis not present

## 2024-03-22 DIAGNOSIS — K5900 Constipation, unspecified: Secondary | ICD-10-CM | POA: Diagnosis not present

## 2024-03-22 DIAGNOSIS — G9600 Cerebrospinal fluid leak, unspecified: Secondary | ICD-10-CM | POA: Diagnosis not present

## 2024-03-22 LAB — CBC
HCT: 29.8 % — ABNORMAL LOW (ref 36.0–46.0)
Hemoglobin: 9.6 g/dL — ABNORMAL LOW (ref 12.0–15.0)
MCH: 28.4 pg (ref 26.0–34.0)
MCHC: 32.2 g/dL (ref 30.0–36.0)
MCV: 88.2 fL (ref 80.0–100.0)
Platelets: 283 K/uL (ref 150–400)
RBC: 3.38 MIL/uL — ABNORMAL LOW (ref 3.87–5.11)
RDW: 12.6 % (ref 11.5–15.5)
WBC: 4.2 K/uL (ref 4.0–10.5)
nRBC: 0 % (ref 0.0–0.2)

## 2024-03-22 LAB — BASIC METABOLIC PANEL WITH GFR
Anion gap: 6 (ref 5–15)
BUN: 11 mg/dL (ref 6–20)
CO2: 27 mmol/L (ref 22–32)
Calcium: 9.2 mg/dL (ref 8.9–10.3)
Chloride: 105 mmol/L (ref 98–111)
Creatinine, Ser: 0.87 mg/dL (ref 0.44–1.00)
GFR, Estimated: >60 mL/min (ref >60)
Glucose, Bld: 102 mg/dL — ABNORMAL HIGH (ref 70–99)
Potassium: 4.1 mmol/L (ref 3.5–5.1)
Sodium: 138 mmol/L (ref 135–145)

## 2024-03-22 MED ORDER — SENNA 8.6 MG PO TABS
2.0000 | ORAL_TABLET | Freq: Two times a day (BID) | ORAL | Status: DC
  Administered 2024-03-23 – 2024-03-25 (×5): 17.2 mg via ORAL
  Filled 2024-03-22 (×7): qty 2

## 2024-03-22 MED ORDER — BISACODYL 5 MG PO TBEC: 10.0000 mg | DELAYED_RELEASE_TABLET | Freq: Every day | ORAL | Status: DC | PRN

## 2024-03-22 MED ORDER — POLYETHYLENE GLYCOL 3350 17 G PO PACK
17.0000 g | PACK | Freq: Two times a day (BID) | ORAL | Status: DC
  Administered 2024-03-23 – 2024-03-25 (×4): 17 g via ORAL
  Filled 2024-03-22 (×5): qty 1

## 2024-03-22 MED ORDER — ALUM & MAG HYDROXIDE-SIMETH 200-200-20 MG/5ML PO SUSP
15.0000 mL | Freq: Once | ORAL | Status: AC
  Administered 2024-03-22: 15 mL via ORAL
  Filled 2024-03-22: qty 30

## 2024-03-22 NOTE — Progress Notes (Signed)
 Patient with lumbar drain clamped, patient C/O the left side of head feeling more swollen than it has been upon evaluating the area it feels boggy and more swollen than last night. Spoke with Gerard Beck and she advised opening the lumbar drain back up. Will continue to monitor.

## 2024-03-22 NOTE — Progress Notes (Signed)
   NAME:  Alexa [name], MRN:  [id number], DOB:  03-Mar-1971, LOS: 5 ADMISSION DATE:  03/17/2024, CONSULTATION DATE:  03/17/2024 REFERRING MD:  Rockey Peru MD, CHIEF COMPLAINT:  CSF leak   History of Present Illness:   53 year old with history of meningioma, hypertension, anxiety, depression The patient presents with cerebrospinal fluid leakage following craniotomy for meningioma.   Cerebrospinal fluid leakage - Cerebrospinal fluid leakage developed following craniotomy for meningioma in July 2025. - Initial leakage occurred at the incision site and was managed with sutures, resulting in temporary resolution. - Leakage recurred at a different location above the incision, requiring further intervention.  History of meningioma and neurosurgical interventions - Craniotomy for meningioma performed in 2017 and two additional surgeries in July 2025. - Both meningiomas were benign.  Scoliosis - Severe scoliosis present since childhood. - Concern regarding impact of scoliosis on potential lumbar procedures.  Pertinent  Medical History    has a past medical history of Anemia, Anxiety, Depression, GERD (gastroesophageal reflux disease), Headache, Hypertension, Meningioma (HCC), and PONV (postoperative nausea and vomiting).   Significant Hospital Events: Including procedures, antibiotic start and stop dates in addition to other pertinent events   8/17 admitted 8/18 lumbar drain. Post op new right foot paresthesia  8/20 no issues overnight, continues to complain of persistent significant right foot paresthesias 8/21 no issues overnight, reports pain/paresthesias improved with Vicodin use  Interim History / Subjective:  Frustrated because she is still in the ICU.  Wants to get out of bed.  Says her pain is tolerable with current pain regimen  Objective    Blood pressure 107/65, pulse 60, temperature 98.5 F (36.9 C), temperature source Oral, resp. rate 10, height 5' 8 (1.727 m), weight  95.3 kg, SpO2 92%.        Intake/Output Summary (Last 24 hours) at 03/22/2024 0756 Last data filed at 03/22/2024 0700 Gross per 24 hour  Intake 1320 ml  Output 1224 ml  Net 96 ml   Filed Weights   03/17/24 1326  Weight: 95.3 kg    Examination: General Pleasant 53 year old female patient lying in bed no acute distress HEENT normocephalic atraumatic posterior left occipital site clean dry intact Pulmonary clear to auscultation Cardiac regular rate and rhythm Abdomen soft not tender tolerating diet but has not had bowel movement since before admit Neuro awake oriented no focal deficits with the exception of right foot paresthesia numbness and pain and a little bit of related weakness which may be pain related GU clear yellow  Resolved problem list   Assessment and Plan  Postoperative cerebrospinal fluid leak after craniotomy -Recurrent CSF leak following craniotomy for meningioma resection. Awaiting her primary neuro surgeon to eval  plan CSF drain per neurosurgery Neuroprotective measures Activity progression per neurosurgery Activity advancement per neurosurgery  As needed pain management   Hypertension plan Holding  atenolol  & HCTZ  Constipation Plan Add regimen for bowel  Anxiety, depression Plan Continue home Wellbutrin  and Lexapro   At risk AKI with slightly increased creatinine - GFR remains greater than 60, encouraged PO intake. Holding her home diuretic Plan Cont to  hold HCTZ Trend BMP intermittently   Best Practice (right click and Reselect all SmartList Selections daily)   Diet/type: Regular consistency (see orders) DVT prophylaxis prophylactic heparin  ->placed on hold pre-drain placement  Pressure ulcer(s): N/A GI prophylaxis: N/A Lines: N/A Foley:  N/A Code Status:  full code Last date of multidisciplinary goals of care discussion: Per primary   Critical care time: NA

## 2024-03-22 NOTE — Progress Notes (Signed)
 Patient ID: Alexa [name], female   DOB: 1970-11-26, 53 y.o.   MRN: [id number] BP 117/70 (BP Location: Left Arm)   Pulse 78   Temp 98.4 F (36.9 C) (Oral)   Resp 20   Ht 5' 8 (1.727 m)   Wt 95.3 kg   SpO2 96%   BMI 31.93 kg/m  Alert and oriented x 4, speech is clear and fluent Moving all extremities Wound is clean, dry. Lumbar drain clamped since 1330. Doing well

## 2024-03-23 ENCOUNTER — Inpatient Hospital Stay (HOSPITAL_COMMUNITY)

## 2024-03-23 DIAGNOSIS — I10 Essential (primary) hypertension: Secondary | ICD-10-CM | POA: Diagnosis not present

## 2024-03-23 DIAGNOSIS — K5900 Constipation, unspecified: Secondary | ICD-10-CM | POA: Diagnosis not present

## 2024-03-23 DIAGNOSIS — G9600 Cerebrospinal fluid leak, unspecified: Secondary | ICD-10-CM | POA: Diagnosis not present

## 2024-03-23 MED ORDER — ATENOLOL 50 MG PO TABS
50.0000 mg | ORAL_TABLET | Freq: Every day | ORAL | Status: DC
  Administered 2024-03-23 – 2024-03-26 (×3): 50 mg via ORAL
  Filled 2024-03-23 (×3): qty 1

## 2024-03-23 NOTE — Progress Notes (Addendum)
 NAME:  Alexa [name], MRN:  [id number], DOB:  [birth year], LOS: 6 ADMISSION DATE:  03/17/2024, CONSULTATION DATE:  03/17/2024 REFERRING MD:  Rockey Peru MD, CHIEF COMPLAINT:  CSF leak   History of Present Illness:   53 year old with history of meningioma, hypertension, anxiety, depression The patient presents with cerebrospinal fluid leakage following craniotomy for meningioma.   Cerebrospinal fluid leakage - Cerebrospinal fluid leakage developed following craniotomy for meningioma in July 2025. - Initial leakage occurred at the incision site and was managed with sutures, resulting in temporary resolution. - Leakage recurred at a different location above the incision, requiring further intervention.  History of meningioma and neurosurgical interventions - Craniotomy for meningioma performed in 2017 and two additional surgeries in July 2025. - Both meningiomas were benign.  Scoliosis - Severe scoliosis present since childhood. - Concern regarding impact of scoliosis on potential lumbar procedures.  Pertinent  Medical History    has a past medical history of Anemia, Anxiety, Depression, GERD (gastroesophageal reflux disease), Headache, Hypertension, Meningioma (HCC), and PONV (postoperative nausea and vomiting).   Significant Hospital Events: Including procedures, antibiotic start and stop dates in addition to other pertinent events   8/17 admitted 8/18 lumbar drain. Post op new right foot paresthesia  8/20 no issues overnight, continues to complain of persistent significant right foot paresthesias 8/21 no issues overnight, reports pain/paresthesias improved with Vicodin use 8/22 lumbar drain clamped and then unclamped due to concern for bogginess of the head from CSF leak.   Interim History / Subjective:  Overnight the lumbar drain was unclamped as the patient started having bogginess at the site of craniotomy concerning for leak.  Currently is unclamped.  Total output 150 from  7P -7A.  Objective    Blood pressure 120/79, pulse 67, temperature 98 F (36.7 C), temperature source Axillary, resp. rate 14, height 5' 8 (1.727 m), weight 95.3 kg, SpO2 97%.        Intake/Output Summary (Last 24 hours) at 03/23/2024 1028 Last data filed at 03/23/2024 0700 Gross per 24 hour  Intake 120 ml  Output 193 ml  Net -73 ml   Filed Weights   03/17/24 1326  Weight: 95.3 kg    Examination: General: not in distress patient appearing healthy Lungs: clear to auscultation bilaterally.  Heart: regular rate rhythm, no murmur appreciated.  Abdomen: non tender, non distended. Normal BS.  Neuro: axox3.  Moving all extremities. Has bogginess on the left head which is better compared to last night  Resolved problem list   Assessment and Plan  Postoperative cerebrospinal fluid leak after craniotomy -Recurrent CSF leak following craniotomy for meningioma resection. Has lumbar drain in place.   plan CSF drain per neurosurgery Neuroprotective measures Activity progression per neurosurgery Activity advancement per neurosurgery  As needed pain management   Hypertension Resumed atenolol .  Hold hydrochlorothiazide .  Constipation Bowel regimen  Anxiety, depression Continue home Wellbutrin  and Lexapro    Best Practice (right click and Reselect all SmartList Selections daily)   Diet/type: Regular consistency (see orders) DVT prophylaxis prophylactic heparin  ->placed on hold pre-drain placement  Pressure ulcer(s): N/A GI prophylaxis: N/A Lines: N/A Foley:  N/A Code Status:  full code Last date of multidisciplinary goals of care discussion: Per primary   CRITICAL CARE Performed by: Sammi JONETTA Fredericks.     Total critical care time: 30 minutes   Critical care time was exclusive of separately billable procedures and treating other patients.   Critical care was necessary to treat or prevent imminent or life-threatening deterioration.  Critical care was time spent  personally by me on the following activities: development of treatment plan with patient and/or surrogate as well as nursing, discussions with consultants, evaluation of patient's response to treatment, examination of patient, obtaining history from patient or surrogate, ordering and performing treatments and interventions, ordering and review of laboratory studies, ordering and review of radiographic studies, pulse oximetry, re-evaluation of patient's condition and participation in multidisciplinary rounds.  Sammi JONETTA Fredericks, MD Pulmonary, Critical Care and Sleep Attending.  Pager: [phone]  03/23/2024, 10:37 AM

## 2024-03-24 ENCOUNTER — Encounter (HOSPITAL_COMMUNITY): Payer: Self-pay | Admitting: Neurosurgery

## 2024-03-24 ENCOUNTER — Inpatient Hospital Stay (HOSPITAL_COMMUNITY): Admitting: Registered Nurse

## 2024-03-24 ENCOUNTER — Other Ambulatory Visit: Payer: Self-pay

## 2024-03-24 ENCOUNTER — Encounter (HOSPITAL_COMMUNITY)
Admission: EM | Disposition: A | Discharge status: Home Health | Payer: Self-pay | Source: Home / Self Care | Attending: Neurosurgery

## 2024-03-24 DIAGNOSIS — G9600 Cerebrospinal fluid leak, unspecified: Secondary | ICD-10-CM

## 2024-03-24 DIAGNOSIS — K5900 Constipation, unspecified: Secondary | ICD-10-CM | POA: Diagnosis not present

## 2024-03-24 DIAGNOSIS — G9782 Other postprocedural complications and disorders of nervous system: Secondary | ICD-10-CM | POA: Diagnosis present

## 2024-03-24 DIAGNOSIS — I10 Essential (primary) hypertension: Secondary | ICD-10-CM | POA: Diagnosis not present

## 2024-03-24 DIAGNOSIS — F418 Other specified anxiety disorders: Secondary | ICD-10-CM

## 2024-03-24 HISTORY — PX: VENTRICULOPERITONEAL SHUNT: SHX204

## 2024-03-24 LAB — NO BLOOD PRODUCTS

## 2024-03-24 LAB — POCT PREGNANCY, URINE: Preg Test, Ur: NEGATIVE

## 2024-03-24 SURGERY — SHUNT INSERTION VENTRICULAR-PERITONEAL
Anesthesia: General | Laterality: Right

## 2024-03-24 MED ORDER — CHLORHEXIDINE GLUCONATE 0.12 % MT SOLN
15.0000 mL | Freq: Once | OROMUCOSAL | Status: AC
  Administered 2024-03-24: 15 mL via OROMUCOSAL

## 2024-03-24 MED ORDER — FENTANYL CITRATE (PF) 250 MCG/5ML IJ SOLN
INTRAMUSCULAR | Status: DC | PRN
  Administered 2024-03-24: 100 ug via INTRAVENOUS
  Administered 2024-03-24 (×2): 50 ug via INTRAVENOUS

## 2024-03-24 MED ORDER — FENTANYL CITRATE (PF) 100 MCG/2ML IJ SOLN: 25.0000 ug | INTRAMUSCULAR | Status: DC | PRN

## 2024-03-24 MED ORDER — PROPOFOL 500 MG/50ML IV EMUL
INTRAVENOUS | Status: DC | PRN
  Administered 2024-03-24: 175 ug/kg/min via INTRAVENOUS

## 2024-03-24 MED ORDER — LABETALOL HCL 5 MG/ML IV SOLN: 10.0000 mg | INTRAVENOUS | Status: DC | PRN

## 2024-03-24 MED ORDER — ACETAMINOPHEN 10 MG/ML IV SOLN
INTRAVENOUS | Status: DC | PRN
  Administered 2024-03-24: 1000 mg via INTRAVENOUS

## 2024-03-24 MED ORDER — LIDOCAINE-EPINEPHRINE 0.5 %-1:200000 IJ SOLN
INTRAMUSCULAR | Status: AC
Start: 2024-03-24 — End: 2024-03-24
  Filled 2024-03-24: qty 50

## 2024-03-24 MED ORDER — CHLORHEXIDINE GLUCONATE 0.12 % MT SOLN
OROMUCOSAL | Status: AC
  Filled 2024-03-24: qty 15

## 2024-03-24 MED ORDER — BACITRACIN ZINC 500 UNIT/GM EX OINT
TOPICAL_OINTMENT | CUTANEOUS | Status: DC | PRN
  Administered 2024-03-24: 1 via TOPICAL

## 2024-03-24 MED ORDER — NALOXONE HCL 0.4 MG/ML IJ SOLN: 0.0800 mg | INTRAMUSCULAR | Status: DC | PRN

## 2024-03-24 MED ORDER — LIDOCAINE-EPINEPHRINE 0.5 %-1:200000 IJ SOLN
INTRAMUSCULAR | Status: AC
  Filled 2024-03-24: qty 50

## 2024-03-24 MED ORDER — DEXMEDETOMIDINE HCL IN NACL 80 MCG/20ML IV SOLN
INTRAVENOUS | Status: DC | PRN
  Administered 2024-03-24 (×2): 10 ug via INTRAVENOUS

## 2024-03-24 MED ORDER — FENTANYL CITRATE (PF) 250 MCG/5ML IJ SOLN
INTRAMUSCULAR | Status: AC
  Filled 2024-03-24: qty 5

## 2024-03-24 MED ORDER — ROCURONIUM BROMIDE 10 MG/ML (PF) SYRINGE
PREFILLED_SYRINGE | INTRAVENOUS | Status: DC | PRN
  Administered 2024-03-24 (×3): 10 mg via INTRAVENOUS
  Administered 2024-03-24: 60 mg via INTRAVENOUS

## 2024-03-24 MED ORDER — DEXAMETHASONE SODIUM PHOSPHATE 10 MG/ML IJ SOLN
INTRAMUSCULAR | Status: DC | PRN
  Administered 2024-03-24: 10 mg via INTRAVENOUS

## 2024-03-24 MED ORDER — 0.9 % SODIUM CHLORIDE (POUR BTL) OPTIME
TOPICAL | Status: DC | PRN
Start: 2024-03-24 — End: 2024-03-24
  Administered 2024-03-24: 1000 mL

## 2024-03-24 MED ORDER — BACITRACIN ZINC 500 UNIT/GM EX OINT
TOPICAL_OINTMENT | CUTANEOUS | Status: AC
  Filled 2024-03-24: qty 28.35

## 2024-03-24 MED ORDER — PROPOFOL 10 MG/ML IV BOLUS
INTRAVENOUS | Status: DC | PRN
  Administered 2024-03-24: 150 mg via INTRAVENOUS

## 2024-03-24 MED ORDER — POTASSIUM CHLORIDE IN NACL 20-0.9 MEQ/L-% IV SOLN
INTRAVENOUS | Status: DC
  Filled 2024-03-24 (×4): qty 1000

## 2024-03-24 MED ORDER — ONDANSETRON HCL 4 MG/2ML IJ SOLN: 4.0000 mg | Freq: Once | INTRAMUSCULAR | Status: DC | PRN

## 2024-03-24 MED ORDER — DOCUSATE SODIUM 100 MG PO CAPS
100.0000 mg | ORAL_CAPSULE | Freq: Two times a day (BID) | ORAL | Status: DC
  Administered 2024-03-24 – 2024-03-25 (×3): 100 mg via ORAL
  Filled 2024-03-24 (×4): qty 1

## 2024-03-24 MED ORDER — HEMOSTATIC AGENTS (NO CHARGE) OPTIME
TOPICAL | Status: DC | PRN
  Administered 2024-03-24: 1 via TOPICAL

## 2024-03-24 MED ORDER — SUGAMMADEX SODIUM 200 MG/2ML IV SOLN
INTRAVENOUS | Status: DC | PRN
  Administered 2024-03-24: 200 mg via INTRAVENOUS

## 2024-03-24 MED ORDER — THROMBIN 5000 UNITS EX KIT
PACK | CUTANEOUS | Status: AC
Start: 2024-03-24 — End: 2024-03-24
  Filled 2024-03-24: qty 1

## 2024-03-24 MED ORDER — LIDOCAINE-EPINEPHRINE 0.5 %-1:200000 IJ SOLN
INTRAMUSCULAR | Status: DC | PRN
Start: 2024-03-24 — End: 2024-03-24
  Administered 2024-03-24: 8 mL via INTRADERMAL
  Administered 2024-03-24: 5 mL via INTRADERMAL

## 2024-03-24 MED ORDER — ACETAMINOPHEN 10 MG/ML IV SOLN
INTRAVENOUS | Status: AC
  Filled 2024-03-24: qty 100

## 2024-03-24 MED ORDER — SODIUM CHLORIDE 0.9 % IV SOLN: INTRAVENOUS | Status: DC

## 2024-03-24 MED ORDER — LIDOCAINE 2% (20 MG/ML) 5 ML SYRINGE
INTRAMUSCULAR | Status: DC | PRN
  Administered 2024-03-24: 80 mg via INTRAVENOUS

## 2024-03-24 MED ORDER — PANTOPRAZOLE SODIUM 40 MG IV SOLR
40.0000 mg | Freq: Every day | INTRAVENOUS | Status: DC
  Administered 2024-03-24: 40 mg via INTRAVENOUS
  Filled 2024-03-24: qty 10

## 2024-03-24 MED ORDER — ONDANSETRON HCL 4 MG/2ML IJ SOLN
INTRAMUSCULAR | Status: DC | PRN
  Administered 2024-03-24: 4 mg via INTRAVENOUS

## 2024-03-24 MED ORDER — PROPOFOL 10 MG/ML IV BOLUS
INTRAVENOUS | Status: AC
  Filled 2024-03-24: qty 20

## 2024-03-24 MED ORDER — CEFAZOLIN SODIUM-DEXTROSE 2-3 GM-%(50ML) IV SOLR
INTRAVENOUS | Status: DC | PRN
Start: 2024-03-24 — End: 2024-03-24
  Administered 2024-03-24: 2 g via INTRAVENOUS

## 2024-03-24 MED ORDER — PHENYLEPHRINE HCL-NACL 20-0.9 MG/250ML-% IV SOLN
INTRAVENOUS | Status: DC | PRN
  Administered 2024-03-24: 30 ug/min via INTRAVENOUS

## 2024-03-24 MED ORDER — ORAL CARE MOUTH RINSE: 15.0000 mL | Freq: Once | OROMUCOSAL | Status: AC

## 2024-03-24 MED ORDER — THROMBIN 5000 UNITS EX SOLR
CUTANEOUS | Status: DC | PRN
  Administered 2024-03-24: 5000 [IU] via TOPICAL

## 2024-03-24 SURGICAL SUPPLY — 51 items
BAG COUNTER SPONGE SURGICOUNT (BAG) ×1 IMPLANT
BLADE CLIPPER SURG (BLADE) ×2 IMPLANT
BUR ACORN 6.0 PRECISION (BURR) ×1 IMPLANT
CANISTER SUCTION 3000ML PPV (SUCTIONS) ×1 IMPLANT
CLAMP SUTURE YELLOW 5 PAIRS (MISCELLANEOUS) ×1 IMPLANT
CLIP RANEY DISP (INSTRUMENTS) IMPLANT
COVER MAYO STAND STRL (DRAPES) IMPLANT
DERMABOND ADVANCED .7 DNX6 (GAUZE/BANDAGES/DRESSINGS) IMPLANT
DRAPE INCISE IOBAN 66X45 STRL (DRAPES) ×1 IMPLANT
DRAPE SURG ORHT 6 SPLT 77X108 (DRAPES) ×2 IMPLANT
DRSG OPSITE POSTOP 4X6 (GAUZE/BANDAGES/DRESSINGS) IMPLANT
DURAPREP 26ML APPLICATOR (WOUND CARE) ×2 IMPLANT
ELECTRODE REM PT RTRN 9FT ADLT (ELECTROSURGICAL) ×1 IMPLANT
GAUZE 4X4 16PLY ~~LOC~~+RFID DBL (SPONGE) IMPLANT
GLOVE ECLIPSE 6.5 STRL STRAW (GLOVE) ×1 IMPLANT
GOWN STRL REUS W/ TWL LRG LVL3 (GOWN DISPOSABLE) ×2 IMPLANT
GOWN STRL REUS W/ TWL XL LVL3 (GOWN DISPOSABLE) IMPLANT
GOWN STRL REUS W/TWL 2XL LVL3 (GOWN DISPOSABLE) IMPLANT
HEMOSTAT SURGICEL 2X14 (HEMOSTASIS) IMPLANT
KIT BASIN OR (CUSTOM PROCEDURE TRAY) ×1 IMPLANT
KIT TURNOVER KIT B (KITS) ×1 IMPLANT
MARKER SKIN DUAL TIP RULER LAB (MISCELLANEOUS) ×1 IMPLANT
NDL HYPO 21X1.5 SAFETY (NEEDLE) IMPLANT
NDL HYPO 25X1 1.5 SAFETY (NEEDLE) ×1 IMPLANT
NEEDLE HYPO 21X1.5 SAFETY (NEEDLE) ×1 IMPLANT
NEEDLE HYPO 25X1 1.5 SAFETY (NEEDLE) ×1 IMPLANT
NS IRRIG 1000ML POUR BTL (IV SOLUTION) ×1 IMPLANT
PACK LAMINECTOMY NEURO (CUSTOM PROCEDURE TRAY) ×1 IMPLANT
PAD ARMBOARD POSITIONER FOAM (MISCELLANEOUS) ×3 IMPLANT
PASSER CATH 36 CODMAN DISP (NEUROSURGERY SUPPLIES) IMPLANT
SET TUBE SMOKE EVAC HIGH FLOW (TUBING) IMPLANT
SLEEVE Z-THREAD 5X100MM (TROCAR) IMPLANT
SPIKE FLUID TRANSFER (MISCELLANEOUS) ×1 IMPLANT
SPONGE SURGIFOAM ABS GEL 12-7 (HEMOSTASIS) IMPLANT
SPONGE T-LAP 4X18 ~~LOC~~+RFID (SPONGE) IMPLANT
STAPLER SKIN PROX 35W (STAPLE) ×1 IMPLANT
STRIP CLOSURE SKIN 1/4X4 (GAUZE/BANDAGES/DRESSINGS) IMPLANT
SUT ETHILON 3 0 PS 1 (SUTURE) ×1 IMPLANT
SUT NURALON 4 0 TR CR/8 (SUTURE) IMPLANT
SUT SILK 0 100YDS SPOOL (SUTURE) IMPLANT
SUT SILK 2 0 TIES 10X30 (SUTURE) IMPLANT
SUT SILK 3 0 SH 30 (SUTURE) IMPLANT
SUT VIC AB 2-0 CT2 18 VCP726D (SUTURE) ×1 IMPLANT
SUT VIC AB 3-0 SH 8-18 (SUTURE) ×1 IMPLANT
SYR CONTROL 10ML LL (SYRINGE) ×1 IMPLANT
TOWEL GREEN STERILE (TOWEL DISPOSABLE) ×1 IMPLANT
TOWEL GREEN STERILE FF (TOWEL DISPOSABLE) ×1 IMPLANT
TROCAR Z-THREAD OPTICAL 5X100M (TROCAR) IMPLANT
UNDERPAD 30X36 HEAVY ABSORB (UNDERPADS AND DIAPERS) ×1 IMPLANT
VALVE RT ANGLE UNITIZE DIST (Valve) IMPLANT
WATER STERILE IRR 1000ML POUR (IV SOLUTION) ×1 IMPLANT

## 2024-03-24 NOTE — Transfer of Care (Signed)
 Immediate Anesthesia Transfer of Care Note  Patient: Alexa [name]  Procedure(s) Performed: SHUNT INSERTION VENTRICULAR-PERITONEAL With Laparoscopic verification of shunt placement into the peritoneum (Right)  Patient Location: PACU  Anesthesia Type:General  Level of Consciousness: drowsy, patient cooperative, and responds to stimulation  Airway & Oxygen Therapy: Patient Spontanous Breathing and Patient connected to face mask oxygen  Post-op Assessment: Report given to RN and Post -op Vital signs reviewed and stable  Post vital signs: Reviewed and stable  Last Vitals:  Vitals Value Taken Time  BP 114/72 03/24/24 11:15  Temp 36.4 C 03/24/24 11:15  Pulse 72 03/24/24 11:18  Resp 17 03/24/24 11:18  SpO2 100 % 03/24/24 11:18  Vitals shown include unfiled device data.  Last Pain:  Vitals:   03/24/24 0600  TempSrc:   PainSc: Asleep         Complications: No notable events documented.

## 2024-03-24 NOTE — Anesthesia Postprocedure Evaluation (Signed)
 Anesthesia Post Note  Patient: Alexa [name]  Procedure(s) Performed: SHUNT INSERTION VENTRICULAR-PERITONEAL With Laparoscopic verification of shunt placement into the peritoneum (Right)     Patient location during evaluation: PACU Anesthesia Type: General Level of consciousness: awake and alert Pain management: pain level controlled Vital Signs Assessment: post-procedure vital signs reviewed and stable Respiratory status: spontaneous breathing, nonlabored ventilation and respiratory function stable Cardiovascular status: blood pressure returned to baseline and stable Postop Assessment: no apparent nausea or vomiting Anesthetic complications: no   No notable events documented.  Last Vitals:  Vitals:   03/24/24 1130 03/24/24 1145  BP: 119/71 123/79  Pulse: 67 67  Resp: 13 15  Temp:  (!) 36.1 C  SpO2: 100% 100%    Last Pain:  Vitals:   03/24/24 1145  TempSrc:   PainSc: 0-No pain                 Garnette FORBES Skillern

## 2024-03-24 NOTE — Op Note (Signed)
 Preoperative diagnosis: CSF leakage  Postoperative diagnosis: same   Procedure: intraoperative consult, laparoscopic access for VP shunt insertion  Surgeon: Herlene Bureau, M.D.  Co-Surgeon Rockey Peru  Anesthesia: GETA  Description of procedure:  I was called into the operating room during the procedure. Dr. Peru had made a subxiphoid incision and was having trouble confirming an intraperitoneal access. I scrubbed in to access. The fascia had been opened but the peritoneum was not up against the fascia. I determined that likely the incision was overlying the falciform. I attempted to probe to enter the peritoneum within the falciform but also could not confirm.  At this time I decided to gain laparoscopic access to better understand the anatomy. A small left subcostal incision was made. A 5mm trocar was used to gain access to the peritoneal cavity by optical entry technique. Pneumoperitoneum was applied with a high flow and low pressure. The laparoscope was reinserted to confirm position.  Upon palpation within the subxiphoid incision, it was confirmed that the dissection was in the falciform ligament. Using laparoscopic visualization, the falciform was bluntly dissected free and a window into the peritoneal cavity created. The catheter was then advanced into this window and confirmed that more than 45 cm was in the peritoneal cavity. Pneumoperitoneum was stopped and trocar removed.  Please see Dr. Shelagh notes for additional details.  Herlene Bureau, M.D. General, Bariatric, & Minimally Invasive Surgery [facility]

## 2024-03-24 NOTE — Anesthesia Procedure Notes (Signed)
 Procedure Name: Intubation Date/Time: 03/24/2024 8:17 AM  Performed by: Virgil Ee, CRNAPre-anesthesia Checklist: Patient identified, Patient being monitored, Timeout performed, Emergency Drugs available and Suction available Patient Re-evaluated:Patient Re-evaluated prior to induction Oxygen Delivery Method: Circle system utilized Preoxygenation: Pre-oxygenation with 100% oxygen Induction Type: IV induction Ventilation: Mask ventilation without difficulty Laryngoscope Size: Mac and 4 Grade View: Grade I Tube type: Oral Tube size: 7.0 mm Number of attempts: 1 Airway Equipment and Method: Stylet Placement Confirmation: ETT inserted through vocal cords under direct vision, positive ETCO2 and breath sounds checked- equal and bilateral Secured at: 21 cm Tube secured with: Tape Dental Injury: Teeth and Oropharynx as per pre-operative assessment

## 2024-03-24 NOTE — Progress Notes (Signed)
 Patient transported to PACU/ShortStay with RN x2. Placed on PACU telemetry and bedside handoff given to PACU nurses. Limitorr set up on IV pole and leveled appropriately before this RN left patient in their care. No patient complains on my exit.

## 2024-03-24 NOTE — Progress Notes (Signed)
 Patient ID: Alexa [name], female   DOB: [birth year], 53 y.o.   MRN: [id number] BP 121/76   Pulse 68   Temp 98.6 F (37 C) (Oral)   Resp 14   Ht 5' 7.99 (1.727 m)   Wt 95.3 kg   SpO2 96%   BMI 31.94 kg/m  Alert and oriented x4 Speech is clear and fluent Moving all extremities Perrl OR  for vp shunt

## 2024-03-24 NOTE — Op Note (Signed)
 03/24/2024  11:28 AM  PATIENT:  Alexa [name]  53 y.o. female Status post posterior fossa meningioma resection with persistent post op csf leak PRE-OPERATIVE DIAGNOSIS:  Elevated Intracranial Pressure  POST-OPERATIVE DIAGNOSIS:  Elevated Intracranial Pressure  PROCEDURE:  right Frontal ventriculoperitoneal shunt creation Codman-Hakim programmable valve set at 115mmH2O  SURGEON:  Surgeon(s): Gillie Duncans, MD Kinsinger, Herlene Righter, MD  ASSISTANTS:kinsinger, Herlene  ANESTHESIA:   General  EBL:  Total I/O In: 800 [I.V.:800] Out: 30 [Blood:30]  COUNT:per nursing  SPECIMEN:  No Specimen  DICTATION: Mrs. Barren was brought to the operating room intubated and placed under a general anesthetic without difficulty. She was positioned supine on the operating room table. Her head was shaved and prepped in a sterile manner. The neck and abdomen was also prepped and draped in a sterile manner. I infiltrated lidocaine  into the abdomen, in the right upper quadrant just off the midline. I also infiltrated lidocaine  into the planned scalp incision.  I opened the abdomen with a 10 blade and dissected through the subcutaneous tissue to the anterior rectus sheath. I opened the anterior rectus sheath with Metzenbaum scissors. I then bluntly divided the rectus muscle and retracted it with vicryl sutures, exposing the posterior rectus sheath. I opened the posterior rectus sheath with Metzenbaum scissors. At this point I could not advance to the peritoneum. I asked for general surgery assistance. Dr. Stevie, who has dictated under separate cover, obtained access to the peritoneal cavity. I had been in the falciform ligament.  I then tunneled from the abdominal incision to the post auricular region. I opened the skin with a scalpel over the tunneler then brought the tunneler through the incision. I passed a silk tie from the post auricular incision to the abdominal incisioin, securing the ends with hemostats.  I  opened the scalp incision with a 10 blade. I created a burr hole with the drill and acorn attachment. I tunneled from the scalp incision to the post auricular incision using another silk tie. I brought the shunt into the operative field and using the ties pulled the distal end of the catheter out of the abdominal incision.  I opened the dura and placed the ventricular catheter into the lateral ventricle. We then cut the catheter and secured it to the integrated shunt. I observed good flow from the distal end of the catheter. At this time the distal catheter was placed into the peritoneum, and the abdominal incision was closed in layers using vicryl sutures. Dermabond was used for a sterile dressing. The cranial incisions were then closed with galeal sutures, and the scalp with staples. Sterile dressings were applied.   PLAN OF CARE: Admit to inpatient   PATIENT DISPOSITION:  PACU - hemodynamically stable.   Delay start of Pharmacological VTE agent (>24hrs) due to surgical blood loss or risk of bleeding:  yes

## 2024-03-24 NOTE — Progress Notes (Signed)
 NAME:  Alexa [name], MRN:  [id number], DOB:  Dec 29, 1970, LOS: 7 ADMISSION DATE:  03/17/2024, CONSULTATION DATE:  03/17/2024 REFERRING MD:  Rockey Peru MD, CHIEF COMPLAINT:  CSF leak   History of Present Illness:   53 year old with history of meningioma, hypertension, anxiety, depression The patient presents with cerebrospinal fluid leakage following craniotomy for meningioma.   Cerebrospinal fluid leakage - Cerebrospinal fluid leakage developed following craniotomy for meningioma in July 2025. - Initial leakage occurred at the incision site and was managed with sutures, resulting in temporary resolution. - Leakage recurred at a different location above the incision, requiring further intervention.  History of meningioma and neurosurgical interventions - Craniotomy for meningioma performed in 2017 and two additional surgeries in July 2025. - Both meningiomas were benign.  Scoliosis - Severe scoliosis present since childhood. - Concern regarding impact of scoliosis on potential lumbar procedures.  Pertinent  Medical History    has a past medical history of Anemia, Anxiety, Depression, GERD (gastroesophageal reflux disease), Headache, Hypertension, Meningioma (HCC), and PONV (postoperative nausea and vomiting).   Significant Hospital Events: Including procedures, antibiotic start and stop dates in addition to other pertinent events   8/17 admitted 8/18 lumbar drain. Post op new right foot paresthesia  8/20 no issues overnight, continues to complain of persistent significant right foot paresthesias 8/21 no issues overnight, reports pain/paresthesias improved with Vicodin use 8/22 lumbar drain clamped and then unclamped due to concern for bogginess of the head from CSF leak.  8/24: VP shunt placement.   Interim History / Subjective:  Patient went early in the morning for VP shunt placement.  I saw the patient after the VP shunt placement.  Denies any symptoms.  Objective     Blood pressure (!) 147/79, pulse 79, temperature 98.7 F (37.1 C), temperature source Oral, resp. rate 17, height 5' 7.99 (1.727 m), weight 95.3 kg, SpO2 100%.    FiO2 (%):  [21 %] 21 %   Intake/Output Summary (Last 24 hours) at 03/24/2024 1631 Last data filed at 03/24/2024 1600 Gross per 24 hour  Intake 1591.16 ml  Output 749 ml  Net 842.16 ml   Filed Weights   03/17/24 1326 03/24/24 0625  Weight: 95.3 kg 95.3 kg    Examination: General: not in distress patient appearing healthy Lungs: clear to auscultation bilaterally.  Heart: regular rate rhythm, no murmur appreciated.  Abdomen: non tender, non distended. Normal BS.  Neuro: axox3.  Moving all extremities. Has bogginess on the left head which is better compared to last night  Resolved problem list   Assessment and Plan  Postoperative cerebrospinal fluid leak after craniotomy -Recurrent CSF leak following craniotomy for meningioma resection. Has lumbar drain in place> lumbar drain removed and VP shunt placed on 8/24.  Plan Neurosurgery primary team. Neuroprotective measures Activity progression per neurosurgery Activity advancement per neurosurgery  As needed pain management   Hypertension Resumed atenolol .  Hold hydrochlorothiazide .  Constipation Bowel regimen  Anxiety, depression Continue home Wellbutrin  and Lexapro    Best Practice (right click and Reselect all SmartList Selections daily)   Diet/type: Regular consistency (see orders) DVT prophylaxis prophylactic heparin  ->placed on hold pre-drain placement  Pressure ulcer(s): N/A GI prophylaxis: N/A Lines: N/A Foley:  N/A Code Status:  full code Last date of multidisciplinary goals of care discussion: Per primary   CRITICAL CARE Performed by: Sammi JONETTA Fredericks.     Total critical care time: 30 minutes   Critical care time was exclusive of separately billable procedures and treating other  patients.   Critical care was necessary to treat or prevent  imminent or life-threatening deterioration.   Critical care was time spent personally by me on the following activities: development of treatment plan with patient and/or surrogate as well as nursing, discussions with consultants, evaluation of patient's response to treatment, examination of patient, obtaining history from patient or surrogate, ordering and performing treatments and interventions, ordering and review of laboratory studies, ordering and review of radiographic studies, pulse oximetry, re-evaluation of patient's condition and participation in multidisciplinary rounds.  Sammi JONETTA Fredericks, MD Pulmonary, Critical Care and Sleep Attending.  Pager: [phone]  03/24/2024, 4:31 PM

## 2024-03-24 NOTE — Anesthesia Preprocedure Evaluation (Addendum)
 Anesthesia Evaluation  Patient identified by MRN, date of birth, ID band Patient awake    Reviewed: Allergy & Precautions, NPO status , Patient's Chart, lab work & pertinent test results, reviewed documented beta blocker date and time   History of Anesthesia Complications (+) PONV and history of anesthetic complications  Airway Mallampati: II  TM Distance: >3 FB Neck ROM: Full    Dental  (+) Teeth Intact, Dental Advisory Given   Pulmonary neg pulmonary ROS   Pulmonary exam normal breath sounds clear to auscultation       Cardiovascular hypertension, Pt. on home beta blockers and Pt. on medications Normal cardiovascular exam Rhythm:Regular Rate:Normal     Neuro/Psych  Headaches PSYCHIATRIC DISORDERS Anxiety Depression    Meningioma    GI/Hepatic Neg liver ROS,GERD  ,,  Endo/Other  Obesity   Renal/GU negative Renal ROS     Musculoskeletal negative musculoskeletal ROS (+)    Abdominal   Peds  Hematology  (+) Blood dyscrasia, anemia , REFUSES BLOOD PRODUCTS  Anesthesia Other Findings Day of surgery medications reviewed with the patient.  Reproductive/Obstetrics                              Anesthesia Physical Anesthesia Plan  ASA: 3  Anesthesia Plan: General   Post-op Pain Management: Ofirmev  IV (intra-op)*   Induction: Intravenous  PONV Risk Score and Plan: 4 or greater and Dexamethasone , Ondansetron  and Midazolam   Airway Management Planned: Oral ETT  Additional Equipment:   Intra-op Plan:   Post-operative Plan: Extubation in OR  Informed Consent: I have reviewed the patients History and Physical, chart, labs and discussed the procedure including the risks, benefits and alternatives for the proposed anesthesia with the patient or authorized representative who has indicated his/her understanding and acceptance.     Dental advisory given  Plan Discussed with:  CRNA  Anesthesia Plan Comments:          Anesthesia Quick Evaluation

## 2024-03-24 NOTE — Plan of Care (Signed)

## 2024-03-25 ENCOUNTER — Encounter (HOSPITAL_COMMUNITY): Payer: Self-pay | Admitting: Neurosurgery

## 2024-03-25 DIAGNOSIS — G9600 Cerebrospinal fluid leak, unspecified: Secondary | ICD-10-CM | POA: Diagnosis not present

## 2024-03-25 LAB — CBC
HCT: 28.4 % — ABNORMAL LOW (ref 36.0–46.0)
Hemoglobin: 9.2 g/dL — ABNORMAL LOW (ref 12.0–15.0)
MCH: 28.8 pg (ref 26.0–34.0)
MCHC: 32.4 g/dL (ref 30.0–36.0)
MCV: 89 fL (ref 80.0–100.0)
Platelets: 324 K/uL (ref 150–400)
RBC: 3.19 MIL/uL — ABNORMAL LOW (ref 3.87–5.11)
RDW: 12.9 % (ref 11.5–15.5)
WBC: 7.3 K/uL (ref 4.0–10.5)
nRBC: 0 % (ref 0.0–0.2)

## 2024-03-25 LAB — BASIC METABOLIC PANEL WITH GFR
Anion gap: 5 (ref 5–15)
BUN: 10 mg/dL (ref 6–20)
CO2: 27 mmol/L (ref 22–32)
Calcium: 9.1 mg/dL (ref 8.9–10.3)
Chloride: 105 mmol/L (ref 98–111)
Creatinine, Ser: 0.79 mg/dL (ref 0.44–1.00)
GFR, Estimated: >60 mL/min (ref >60)
Glucose, Bld: 87 mg/dL (ref 70–99)
Potassium: 3.8 mmol/L (ref 3.5–5.1)
Sodium: 137 mmol/L (ref 135–145)

## 2024-03-25 MED ORDER — HYDROCODONE-ACETAMINOPHEN 5-325 MG PO TABS: 1.0000 | ORAL_TABLET | Freq: Four times a day (QID) | ORAL | 0 refills | Status: DC | PRN

## 2024-03-25 MED ORDER — PANTOPRAZOLE SODIUM 40 MG PO TBEC
40.0000 mg | DELAYED_RELEASE_TABLET | Freq: Every day | ORAL | Status: DC
  Administered 2024-03-25: 40 mg via ORAL
  Filled 2024-03-25: qty 1

## 2024-03-25 NOTE — Evaluation (Signed)
 Occupational Therapy Evaluation Patient Details Name: Alexa [name] MRN: [id number] DOB: [birth year] Today's Date: 03/25/2024   History of Present Illness   Pt is a 53 y/o F presenting to ED on 8/17 with intermittent CSF leakage, not able to be managed outpatient by sutures. Recent craniotomy on 7/23 for meningioma. Now s/p wound debridement and lumbar drain placement (8/18-8/24), and R VP shunt creation via laparoscopic access on 8/24. PMH includes meningioma, HTN, anxiety, depression.     Clinical Impressions Pt reports using rollator at home for mobility since last admission (she had been having incr dizziness due to CSF leak), and bathes seated but is ind with ADLs, lives with her mom and sister who assist with IADLs. Pt currently needing up to min A for ADLs, min A for bed mobility and CGA for transfers with RW. Pt denies dizziness during session and BP 118/81 (93) at end of session. Pt presenting with impairments listed below, will follow acutely. Recommend HHOT at d/c.      If plan is discharge home, recommend the following:   A little help with walking and/or transfers;A little help with bathing/dressing/bathroom;Assistance with cooking/housework;Supervision due to cognitive status;Assist for transportation     Functional Status Assessment   Patient has had a recent decline in their functional status and demonstrates the ability to make significant improvements in function in a reasonable and predictable amount of time.     Equipment Recommendations   None recommended by OT (pt has all needed DME)     Recommendations for Other Services   PT consult     Precautions/Restrictions   Precautions Precautions: Fall Precaution/Restrictions Comments: SBP 90-160 Restrictions Weight Bearing Restrictions Per Provider Order: No     Mobility Bed Mobility Overal bed mobility: Needs Assistance Bed Mobility: Supine to Sit     Supine to sit: Min assist     General  bed mobility comments: min A due to abd pain from surgery    Transfers Overall transfer level: Needs assistance Equipment used: Rolling walker (2 wheels) Transfers: Sit to/from Stand Sit to Stand: Contact guard assist                  Balance Overall balance assessment: Needs assistance Sitting-balance support: No upper extremity supported, Feet supported Sitting balance-Leahy Scale: Good     Standing balance support: No upper extremity supported, During functional activity Standing balance-Leahy Scale: Fair Standing balance comment: static standing without AD                           ADL either performed or assessed with clinical judgement   ADL Overall ADL's : Needs assistance/impaired Eating/Feeding: Set up;Sitting   Grooming: Set up;Wash/dry hands;Standing   Upper Body Bathing: Supervision/ safety;Standing;Sitting   Lower Body Bathing: Supervison/ safety;Sitting/lateral leans   Upper Body Dressing : Contact guard assist;Sitting   Lower Body Dressing: Minimal assistance;Sitting/lateral leans   Toilet Transfer: Contact guard assist;Ambulation;Rolling walker (2 wheels);Regular Toilet   Toileting- Clothing Manipulation and Hygiene: Contact guard assist       Functional mobility during ADLs: Contact guard assist;Rolling walker (2 wheels)       Vision Baseline Vision/History: 1 Wears glasses Additional Comments: pt reports distorted vision without glasses on, but this has been chronic     Perception Perception: Not tested       Praxis Praxis: Not tested       Pertinent Vitals/Pain Pain Assessment Pain Assessment: No/denies pain  Extremity/Trunk Assessment Upper Extremity Assessment Upper Extremity Assessment: Overall WFL for tasks assessed;Right hand dominant   Lower Extremity Assessment Lower Extremity Assessment: Defer to PT evaluation   Cervical / Trunk Assessment Cervical / Trunk Assessment: Normal   Communication  Communication Communication: No apparent difficulties   Cognition Arousal: Alert Behavior During Therapy: WFL for tasks assessed/performed                                 Following commands: Intact       Cueing  General Comments   Cueing Techniques: Verbal cues  VSS on RA   Exercises     Shoulder Instructions      Home Living Family/patient expects to be discharged to:: Private residence Living Arrangements: Parent;Other relatives (sister) Available Help at Discharge: Family;Available 24 hours/day Type of Home: House Home Access: Ramped entrance     Home Layout: One level     Bathroom Shower/Tub: Producer, television/film/video: Standard     Home Equipment: Agricultural consultant (2 wheels);Cane - single point;Rollator (4 wheels);Shower seat          Prior Functioning/Environment Prior Level of Function : Independent/Modified Independent;Working/employed;Driving             Mobility Comments: rollator to get around at all times due to dizziness ADLs Comments: bathes seated on shower seat, sister/mom have been driving pt as pt does not trust her self to drive at this time    OT Problem List: Impaired balance (sitting and/or standing);Decreased activity tolerance   OT Treatment/Interventions: Self-care/ADL training;Therapeutic exercise;Energy conservation;DME and/or AE instruction;Therapeutic activities;Patient/family education;Balance training;Cognitive remediation/compensation;Visual/perceptual remediation/compensation      OT Goals(Current goals can be found in the care plan section)   Acute Rehab OT Goals Patient Stated Goal: to go home OT Goal Formulation: With patient Time For Goal Achievement: 04/08/24 Potential to Achieve Goals: Good ADL Goals Pt Will Perform Tub/Shower Transfer: Tub transfer;Shower transfer;Independently;ambulating Additional ADL Goal #1: pt will follow 3 step command in prep for ADLs Additional ADL Goal #2: pt will  tolerate standing OOB activity x10 min in order to improve activity tolerance for ADLs   OT Frequency:  Min 1X/week    Co-evaluation              AM-PAC OT 6 Clicks Daily Activity     Outcome Measure Help from another person eating meals?: None Help from another person taking care of personal grooming?: None Help from another person toileting, which includes using toliet, bedpan, or urinal?: None Help from another person bathing (including washing, rinsing, drying)?: A Little Help from another person to put on and taking off regular upper body clothing?: None Help from another person to put on and taking off regular lower body clothing?: A Little 6 Click Score: 22   End of Session Equipment Utilized During Treatment: Gait belt;Rolling walker (2 wheels) Nurse Communication: Mobility status  Activity Tolerance: Patient tolerated treatment well Patient left: in chair;with call bell/phone within reach  OT Visit Diagnosis: Unsteadiness on feet (R26.81);Other abnormalities of gait and mobility (R26.89);Muscle weakness (generalized) (M62.81)                Time: 8987-8960 OT Time Calculation (min): 27 min Charges:  OT General Charges $OT Visit: 1 Visit OT Evaluation $OT Eval Low Complexity: 1 Low OT Treatments $Self Care/Home Management : 8-22 mins  Dewight Catino K, OTD, OTR/L SecureChat Preferred Acute Rehab [phone]   Croy Drumwright K Koonce 03/25/2024, 10:58 AM

## 2024-03-25 NOTE — Evaluation (Signed)
 Physical Therapy Evaluation Patient Details Name: Alexa [name] MRN: [id number] DOB: [birth year] Today's Date: 03/25/2024  History of Present Illness  Pt is a 53 y/o F presenting to ED on 8/17 with intermittent CSF leakage, not able to be managed outpatient by sutures. Recent craniotomy on 7/23 for meningioma. Now s/p wound debridement and lumbar drain placement (8/18-8/24), and R VP shunt creation via laparoscopic access on 8/24. PMH includes meningioma, HTN, anxiety, depression.  Clinical Impression  Pt admitted with above diagnosis. Pt mobilizing well, mildly unsteady in standing without UE support but has RW and rollator at home. However, due to mild balance impairment pt with increased fall risk currently. Gave balance and strengthening exercises and recommend HHPT at d/c. Pt ok to d/c home from mobility standpoint but will place her on PT caseload in case she does not go today.  Pt currently with functional limitations due to the deficits listed below (see PT Problem List). Pt will benefit from acute skilled PT to increase their independence and safety with mobility to allow discharge.           If plan is discharge home, recommend the following: Assistance with cooking/housework;Assist for transportation;Help with stairs or ramp for entrance   Can travel by private vehicle        Equipment Recommendations None recommended by PT  Recommendations for Other Services       Functional Status Assessment Patient has had a recent decline in their functional status and demonstrates the ability to make significant improvements in function in a reasonable and predictable amount of time.     Precautions / Restrictions Precautions Precautions: Fall Recall of Precautions/Restrictions: Intact Precaution/Restrictions Comments: SBP 90-160 Restrictions Weight Bearing Restrictions Per Provider Order: No      Mobility  Bed Mobility               General bed mobility comments:  reviewed sequencing to decrease pressure at incsision site on abdomen but pt received in chair from working with OT. Pt verbalized understanding    Transfers Overall transfer level: Needs assistance Equipment used: Rolling walker (2 wheels) Transfers: Sit to/from Stand Sit to Stand: Supervision           General transfer comment: no physical assist needed from low recliner    Ambulation/Gait Ambulation/Gait assistance: Supervision Gait Distance (Feet): 150 Feet Assistive device: Rolling walker (2 wheels) Gait Pattern/deviations: Step-through pattern, Decreased stride length Gait velocity: decr Gait velocity interpretation: <1.8 ft/sec, indicate of risk for recurrent falls Pre-gait activities: self monitoring for dizziness. standing marching General Gait Details: pt at first with very low step height but able to raise higher when cued. Slow, steady gait with use of RW. Encouraged to use 2 wheel RW initially for additional support before switching back to 4 wheel RW  Stairs            Wheelchair Mobility     Tilt Bed    Modified Rankin (Stroke Patients Only)       Balance Overall balance assessment: Needs assistance Sitting-balance support: No upper extremity supported, Feet supported Sitting balance-Leahy Scale: Good     Standing balance support: No upper extremity supported, During functional activity Standing balance-Leahy Scale: Fair Standing balance comment: static standing without AD. Reviewed standing balance exercises including SL stance and tandem stance                             Pertinent Vitals/Pain Pain Assessment Pain  Assessment: Faces Faces Pain Scale: Hurts little more Pain Location: abdomen Pain Descriptors / Indicators: Operative site guarding Pain Intervention(s): Limited activity within patient's tolerance, Monitored during session    Home Living Family/patient expects to be discharged to:: Private residence Living  Arrangements: Parent;Other relatives (sister) Available Help at Discharge: Family;Available 24 hours/day Type of Home: House Home Access: Ramped entrance       Home Layout: One level Home Equipment: Agricultural consultant (2 wheels);Cane - single point;Rollator (4 wheels);Shower seat      Prior Function Prior Level of Function : Independent/Modified Independent;Working/employed;Driving             Mobility Comments: rollator to get around at all times due to dizziness ADLs Comments: bathes seated on shower seat, sister/mom have been driving pt as pt does not trust her self to drive at this time     Extremity/Trunk Assessment   Upper Extremity Assessment Upper Extremity Assessment: Defer to OT evaluation    Lower Extremity Assessment Lower Extremity Assessment: Overall WFL for tasks assessed    Cervical / Trunk Assessment Cervical / Trunk Assessment: Normal  Communication   Communication Communication: No apparent difficulties    Cognition Arousal: Alert Behavior During Therapy: WFL for tasks assessed/performed   PT - Cognitive impairments: No apparent impairments                       PT - Cognition Comments: mildly slowed responses but appropriate Following commands: Intact       Cueing Cueing Techniques: Verbal cues     General Comments General comments (skin integrity, edema, etc.): VSS on RA. HR up to 105 bpm with ambulation with conversing simultaneously, other wise remained in 80's. Encouraged brain rest 30 mins 2x/ day    Exercises Other Exercises Other Exercises: reviewed mini squats and seated LE ther ex for strengthening   Assessment/Plan    PT Assessment Patient needs continued PT services  PT Problem List Decreased activity tolerance;Decreased strength;Decreased balance;Decreased mobility       PT Treatment Interventions DME instruction;Gait training;Stair training;Functional mobility training;Therapeutic activities;Therapeutic  exercise;Balance training;Neuromuscular re-education;Patient/family education    PT Goals (Current goals can be found in the Care Plan section)  Acute Rehab PT Goals Patient Stated Goal: to return to independence PT Goal Formulation: With patient Time For Goal Achievement: 04/08/24 Potential to Achieve Goals: Good    Frequency Min 2X/week     Co-evaluation               AM-PAC PT 6 Clicks Mobility  Outcome Measure Help needed turning from your back to your side while in a flat bed without using bedrails?: None Help needed moving from lying on your back to sitting on the side of a flat bed without using bedrails?: None Help needed moving to and from a bed to a chair (including a wheelchair)?: A Little Help needed standing up from a chair using your arms (e.g., wheelchair or bedside chair)?: A Little Help needed to walk in hospital room?: A Little Help needed climbing 3-5 steps with a railing? : A Little 6 Click Score: 20    End of Session Equipment Utilized During Treatment: Gait belt Activity Tolerance: Patient tolerated treatment well Patient left: with call bell/phone within reach;in chair Nurse Communication: Mobility status PT Visit Diagnosis: Other abnormalities of gait and mobility (R26.89);Other symptoms and signs involving the nervous system (R29.898)    Time: 8877-8843 PT Time Calculation (min) (ACUTE ONLY): 34 min   Charges:  PT Evaluation $PT Eval Moderate Complexity: 1 Mod PT Treatments $Gait Training: 8-22 mins PT General Charges $$ ACUTE PT VISIT: 1 Visit         Richerd Lipoma, PT  Acute Rehab Services Secure chat preferred Office [phone]   Richerd CROME Roney Youtz 03/25/2024, 12:43 PM

## 2024-03-25 NOTE — Discharge Instructions (Signed)
 Craniotomy Care After Please read the instructions outlined below and refer to this sheet in the next few weeks. These discharge instructions provide you with general information on caring for yourself after you leave the hospital. Your surgeon may also give you specific instructions. While your treatment has been planned according to the most current medical practices available, unavoidable complications occasionally occur. If you have any problems or questions after discharge, please call your surgeon. Although there are many types of brain surgery, recovery following craniotomy (surgical opening of the skull) is much the same for each. However, recovery depends on many factors. These include the type and severity of brain injury and the type of surgery. It also depends on any nervous system function problems (neurological deficits) before surgery. If the craniotomy was done for cancer, chemotherapy and radiation could follow. You could be in the hospital from 5 days to a couple weeks. This depends on the type of surgery, findings, and whether there are complications. HOME CARE INSTRUCTIONS   It is not unusual to hear a clicking noise after a craniotomy, the plates and screws used to attach the bone flap can sometimes cause this. It is a normal occurrence if this does happen  Do not drive for 10 days after the operation  Your scalp may feel spongy for a while, because of fluid under it. This will gradually get better. Occasionally, the surgeon will not replace the bone that was removed to access the brain. If there is a bony defect, the surgeon will ask you to wear a helmet for protection. This is a discussion you should have with your surgeon prior to leaving the hospital (discharge).  Numbness may persist in some areas of your scalp.  Take all medications as directed. Sometimes steroids to control swelling are prescribed. Anticonvulsants to prevent seizures may also be given. Do not use alcohol,  other drugs, or medications unless your surgeon says it is OK.  Keep the wound dry and clean. The wound may be washed gently with soap and water. Then, you may gently blot or dab it dry, without rubbing. Do not take baths, use swimming pools or hot tubs for 10 days, or as instructed by your caregiver. It is best to wait to see you surgeon at your first postoperative visit, and to get directions at that time.  Only take over-the-counter or prescription medicines for pain, discomfort, or fever as directed by your caregiver.  You may continue your normal diet, as directed.  Walking is OK for exercise. Wait at least 3 months before you return to mild, non-contact sports or as your surgeon suggests. Contact sports should be avoided for at least 1 year, unless your surgeon says it is OK.  If you are prescribed steroids, take them exactly as prescribed. If you start having a decrease in nervous system functions (neurological deficits) and headaches as the dose of steroids is reduced, tell your surgeon right away.  When the anticonvulsant prescription is finished you no longer need to take it. SEEK IMMEDIATE MEDICAL CARE IF:   You develop nausea, vomiting, severe headaches, confusion, or you have a seizure.  You develop chest pain, a stiff neck, or difficulty breathing.  There is redness, swelling, or increasing pain in the wound or pin insertion sites.  You have an increase in swelling or bruising around the eyes.  There is drainage or pus coming from the wound.  You have an oral temperature above 102 F (38.9 C), not controlled by medicine.  You notice a foul smell coming from the wound or dressing.  The wound breaks open (edges not staying together) after the stitches have been removed.  You develop dizziness or fainting while standing.  You develop a rash.  You develop any reaction or side effects to the medications given. Document Released: 10/18/2005 Document Revised: 10/10/2011  Document Reviewed: 07/27/2009 [facility] Patient Information 2013 [place], [place].

## 2024-03-25 NOTE — Progress Notes (Signed)
   NAME:  Alexa [name], MRN:  [id number], DOB:  01/06/1971, LOS: 8 ADMISSION DATE:  03/17/2024, CONSULTATION DATE:  03/17/2024 REFERRING MD:  Rockey Peru MD, CHIEF COMPLAINT:  CSF leak   History of Present Illness:   53 year old with history of meningioma, hypertension, anxiety, depression The patient presents with cerebrospinal fluid leakage following craniotomy for meningioma.   Cerebrospinal fluid leakage - Cerebrospinal fluid leakage developed following craniotomy for meningioma in July 2025. - Initial leakage occurred at the incision site and was managed with sutures, resulting in temporary resolution. - Leakage recurred at a different location above the incision, requiring further intervention.  History of meningioma and neurosurgical interventions - Craniotomy for meningioma performed in 2017 and two additional surgeries in July 2025. - Both meningiomas were benign.  Scoliosis - Severe scoliosis present since childhood. - Concern regarding impact of scoliosis on potential lumbar procedures.  Pertinent  Medical History    has a past medical history of Anemia, Anxiety, Depression, GERD (gastroesophageal reflux disease), Headache, Hypertension, Meningioma (HCC), and PONV (postoperative nausea and vomiting).   Significant Hospital Events: Including procedures, antibiotic start and stop dates in addition to other pertinent events   8/17 admitted 8/18 lumbar drain. Post op new right foot paresthesia  8/20 no issues overnight, continues to complain of persistent significant right foot paresthesias 8/21 no issues overnight, reports pain/paresthesias improved with Vicodin use 8/22 lumbar drain clamped and then unclamped due to concern for bogginess of the head from CSF leak.  8/24: VP shunt placement.   Interim History / Subjective:  No events, ongoing issues with R foot neuralgia.  Objective    Blood pressure 126/78, pulse 74, temperature 98.7 F (37.1 C), temperature  source Oral, resp. rate 17, height 5' 7.99 (1.727 m), weight 95.3 kg, SpO2 98%.    FiO2 (%):  [21 %] 21 %   Intake/Output Summary (Last 24 hours) at 03/25/2024 0655 Last data filed at 03/25/2024 0600 Gross per 24 hour  Intake 2434.89 ml  Output 30 ml  Net 2404.89 ml   Filed Weights   03/17/24 1326 03/24/24 0625  Weight: 95.3 kg 95.3 kg    Examination: Aox4 Clear lungs Abd TPP but no induration at peritoneal site Ext warm Moves everything to command Scalp covered: defer surgical site eval to NSGY  No labs  Resolved problem list   Assessment and Plan  Postoperative cerebrospinal fluid leak after craniotomy- s/p VP shunt placement 03/24/24 Recurrent meningioma- requiring multiple craniotomies, last 02/21/24 Hypertension Constipation Anxiety, depression  - Continues on atenolol , wellbutrin , lexapro  - Graded pain scale - Bowel regimen as ordered - Encourage mobility - Postop wound care per NSGY - Will follow until transferred out of ICU or discharged  Rolan Sharps MD PCCM

## 2024-03-25 NOTE — Discharge Summary (Signed)
 Physician Discharge Summary  Patient ID: Alexa [name] MRN: [id number] DOB/AGE: 11-30-1970 53 y.o.  Admit date: 03/17/2024 Discharge date: 03/26/2024  Admission Diagnoses: post operative csf leak status post meningioma resection.  Discharge Diagnoses: same Principal Problem:   CSF leak Active Problems:   Postoperative CSF leak   Discharged Condition: good  Hospital Course: Alexa [name] was admitted due to a postoperative csf leak after meningioma resection. I revised the wound closure and placed a lumbar drain to divert the csf. She did well until the drain was clamped. The csf collection became tense and started to leak less than 12 hrs after clamping. I then recommended and she agreed to a ventriculoperitoneal shunt. I set the pressure to H2O. I did this on Sunday the 24th, August. Post op the csf collection is gone, the shunt is working well. She will go home today with a normal neurological exam. All wounds are clean, dry, and without signs of infection.   Treatments: surgery: as above.  Discharge Exam: Blood pressure 114/75, pulse 82, temperature 98.2 F (36.8 C), temperature source Oral, resp. rate 18, height 5' 7.99 (1.727 m), weight 95.3 kg, SpO2 95%. General appearance: alert, cooperative, appears stated age, and no distress  Disposition: Discharge disposition: 01-Home or Self Care      Elevated Intracranial Pressure  Allergies as of 03/25/2024       Reactions   Watermelon [citrullus Vulgaris] Nausea And Vomiting        Medication List     TAKE these medications    acetaminophen  325 MG tablet Commonly known as: TYLENOL  Take 325 mg by mouth every 6 (six) hours as needed for moderate pain (pain score 4-6).   aspirin-acetaminophen -caffeine 250-250-65 MG tablet Commonly known as: EXCEDRIN MIGRAINE Take 1 tablet by mouth every 6 (six) hours as needed for headache.   atenolol  50 MG tablet Commonly known as: TENORMIN  Take 1 tablet (50 mg total) by  mouth daily.   BLACK COHOSH  PO Take 700 mg by mouth daily.   buPROPion  150 MG 12 hr tablet Commonly known as: WELLBUTRIN  SR Take 1 tablet (150 mg total) by mouth daily.   escitalopram  20 MG tablet Commonly known as: LEXAPRO  Take 2 tablets (40 mg total) by mouth daily.   hydrochlorothiazide  12.5 MG tablet Commonly known as: HYDRODIURIL  Take 1 tablet (12.5 mg total) by mouth daily.   HYDROcodone -acetaminophen  5-325 MG tablet Commonly known as: NORCO/VICODIN Take 1 tablet by mouth every 6 (six) hours as needed for moderate pain (pain score 4-6). What changed: Another medication with the same name was added. Make sure you understand how and when to take each.   HYDROcodone -acetaminophen  5-325 MG tablet Commonly known as: NORCO/VICODIN Take 1 tablet by mouth every 6 (six) hours as needed for up to 8 days for moderate pain (pain score 4-6). What changed: You were already taking a medication with the same name, and this prescription was added. Make sure you understand how and when to take each.   ibuprofen 200 MG tablet Commonly known as: ADVIL Take 200-400 mg by mouth 2 (two) times daily as needed for headache or moderate pain.   OVER THE COUNTER MEDICATION Take 2 capsules by mouth daily. Alexa [name] supplement   SODIUM FLUORIDE  (DENTAL RINSE) 0.2 % Soln Commonly known as: PreviDent  USE AS AN ORAL RINSE, AS DIRECTED ON PACKAGE What changed:  how much to take how to take this when to take this reasons to take this        Follow-up  Information     Tisovec, Charlie ORN, MD. Schedule an appointment as soon as possible for a visit.   Specialty: Internal Medicine Why: PCP; make follow up appt for 1-2 weeks Contact information: [address] [place] [address] [phone]         [facility] Follow up.   Why: [facility] Home physical and occupational therapy; agency will call you to arrange home appts. Contact information: [address] [place] [address] [phone]         Gillie Duncans, MD Follow up.   Specialty: Neurosurgery Why: call to make an appointment to have sutures, staples out in 10 days Contact information: [address] [place] [address] [phone]                 Signed: Duncans Gillie 03/25/2024, 6:46 PM

## 2024-03-25 NOTE — TOC Progression Note (Signed)
 Transition of Care [facility]) - Progression Note    Patient Details  Name: Alexa [name] MRN: [id number] Date of Birth: 12-Jun-1971  Transition of Care [facility]) CM/SW Contact  Zalmen Wrightsman M, RN Phone Number: 03/25/2024, 5:12 PM  Clinical Narrative:    PT/OT recommending HH follow up, and patient agreeable to continued home therapies.  She states she will have 24h assistance at home, provided by mom and sister. She has all needed DME at home.   Referral to [facility] for continued home therapies.  No other discharge needs identified.    Expected Discharge Plan: Home w Home Health Services Barriers to Discharge: Barriers Resolved               Expected Discharge Plan and Services   Discharge Planning Services: CM Consult Post Acute Care Choice: Home Health Living arrangements for the past 2 months: Single Family Home                           HH Arranged: PT, OT HH Agency: [facility] Date [facility] Agency Contacted: 03/25/24 Time HH Agency Contacted: 1712 Representative spoke with at [facility] Agency: Amy Hyatt   Social Drivers of Health (SDOH) Interventions SDOH Screenings   Food Insecurity: Patient Declined (03/23/2024)  Housing: Patient Declined (03/23/2024)  Transportation Needs: No Transportation Needs (03/23/2024)  Utilities: Not At Risk (03/23/2024)  Tobacco Use: Low Risk  (03/24/2024)    Readmission Risk Interventions     No data to display         Mliss MICAEL Fass, RN, BSN  Trauma/Neuro ICU Case Manager [phone]

## 2024-03-26 ENCOUNTER — Other Ambulatory Visit (HOSPITAL_COMMUNITY): Payer: Self-pay

## 2024-03-26 ENCOUNTER — Other Ambulatory Visit (HOSPITAL_BASED_OUTPATIENT_CLINIC_OR_DEPARTMENT_OTHER): Payer: Self-pay

## 2024-03-26 MED ORDER — CYCLOBENZAPRINE HCL 10 MG PO TABS
10.0000 mg | ORAL_TABLET | Freq: Three times a day (TID) | ORAL | 1 refills | Status: DC | PRN
  Filled 2024-03-26 – 2024-03-28 (×2): qty 60, 20d supply, fill #0

## 2024-03-26 MED ORDER — HYDROCODONE-ACETAMINOPHEN 5-325 MG PO TABS
1.0000 | ORAL_TABLET | Freq: Four times a day (QID) | ORAL | 0 refills | Status: DC | PRN
  Filled 2024-03-26 – 2024-03-28 (×2): qty 60, 15d supply, fill #0

## 2024-03-26 NOTE — Progress Notes (Signed)
 Physical Therapy Treatment Patient Details Name: Alexa [name] MRN: [id number] DOB: 17-Jan-1971 Today's Date: 03/26/2024   History of Present Illness Pt is a 53 y/o F presenting to ED on 8/17 with intermittent CSF leakage, not able to be managed outpatient by sutures. Recent craniotomy on 7/23 for meningioma. Now s/p wound debridement and lumbar drain placement (8/18-8/24), and R VP shunt creation via laparoscopic access on 8/24. PMH includes meningioma, HTN, anxiety, depression.    PT Comments  Patient recently walked with nursing with RW and is waiting to discharge home. She deferred mobility stating no concerns. Reviewed her home exercises and provided with handout (see access information below for online access). Pt reports her toilet at home is very low and due to back pain would benefit from [facility] to place over toilet for incr height and armrests. RN making Case Manager aware.     If plan is discharge home, recommend the following: Assistance with cooking/housework;Assist for transportation;Help with stairs or ramp for entrance   Can travel by private vehicle        Equipment Recommendations  BSC/3in1 (pt reports her toilet is very low)    Recommendations for Other Services       Precautions / Restrictions Precautions Precautions: Fall Recall of Precautions/Restrictions: Intact Precaution/Restrictions Comments: SBP 90-160 Restrictions Weight Bearing Restrictions Per Provider Order: No     Mobility  Bed Mobility               General bed mobility comments: up in chair    Transfers                        Ambulation/Gait               General Gait Details: reports recent ambulation with RW; no concerns expressed   Stairs             Wheelchair Mobility     Tilt Bed    Modified Rankin (Stroke Patients Only)       Balance                                            Communication Communication Communication:  No apparent difficulties  Cognition Arousal: Alert Behavior During Therapy: WFL for tasks assessed/performed   PT - Cognitive impairments: No apparent impairments                       PT - Cognition Comments: mildly slowed responses but appropriate Following commands: Intact      Cueing Cueing Techniques: Verbal cues  Exercises Other Exercises Other Exercises: discussed and provided handout for: single leg stance, sit to stand, and mini squats  Access Code: [email or web address] URL: [email or web address] Date: 03/26/2024 Prepared by: [facility]  Exercises - Standing Single Leg Stance with Unilateral Counter Support  - 2 x daily - 7 x weekly - 1 sets - 1 reps - 30-60 sec hold - Sit to Stand  - 2 x daily - 7 x weekly - 1 sets - 3-5 reps - Mini Squat  - 2 x daily - 7 x weekly - 1 sets - 10 reps - - hold  General Comments        Pertinent Vitals/Pain Pain Assessment Pain Assessment: Faces Faces Pain Scale: Hurts a little bit Pain Location: abdomen Pain Descriptors / Indicators: Operative  site guarding Pain Intervention(s): Limited activity within patient's tolerance    Home Living Family/patient expects to be discharged to:: Private residence Living Arrangements: Parent;Other relatives (sister) Available Help at Discharge: Family;Available 24 hours/day Type of Home: House Home Access: Ramped entrance       Home Layout: One level Home Equipment: Agricultural consultant (2 wheels);Cane - single point;Rollator (4 wheels);Shower seat      Prior Function            PT Goals (current goals can now be found in the care plan section) Acute Rehab PT Goals Patient Stated Goal: to return to independence Time For Goal Achievement: 04/08/24 Potential to Achieve Goals: Good Progress towards PT goals: Progressing toward goals    Frequency    Min 2X/week      PT Plan      Co-evaluation              AM-PAC PT 6 Clicks Mobility   Outcome Measure   Help needed turning from your back to your side while in a flat bed without using bedrails?: None Help needed moving from lying on your back to sitting on the side of a flat bed without using bedrails?: None Help needed moving to and from a bed to a chair (including a wheelchair)?: A Little Help needed standing up from a chair using your arms (e.g., wheelchair or bedside chair)?: A Little Help needed to walk in hospital room?: A Little Help needed climbing 3-5 steps with a railing? : A Little 6 Click Score: 20    End of Session   Activity Tolerance: Patient tolerated treatment well Patient left: with call bell/phone within reach;in chair;with family/visitor present Nurse Communication: Other (comment) (pt requesting BSC for over toilet) PT Visit Diagnosis: Other abnormalities of gait and mobility (R26.89);Other symptoms and signs involving the nervous system (R29.898)     Time: 9144-9094 PT Time Calculation (min) (ACUTE ONLY): 10 min  Charges:    $Therapeutic Exercise: 8-22 mins PT General Charges $$ ACUTE PT VISIT: 1 Visit                      Macario RAMAN, PT Acute Rehabilitation Services  Office [phone]    Macario SHAUNNA Soja 03/26/2024, 9:17 AM

## 2024-03-28 ENCOUNTER — Other Ambulatory Visit (HOSPITAL_BASED_OUTPATIENT_CLINIC_OR_DEPARTMENT_OTHER): Payer: Self-pay

## 2024-03-28 ENCOUNTER — Other Ambulatory Visit (HOSPITAL_COMMUNITY): Payer: Self-pay

## 2024-03-28 DIAGNOSIS — I10 Essential (primary) hypertension: Secondary | ICD-10-CM | POA: Diagnosis not present

## 2024-03-31 ENCOUNTER — Other Ambulatory Visit (HOSPITAL_BASED_OUTPATIENT_CLINIC_OR_DEPARTMENT_OTHER): Payer: Self-pay

## 2024-04-01 ENCOUNTER — Emergency Department (HOSPITAL_COMMUNITY)

## 2024-04-01 ENCOUNTER — Other Ambulatory Visit: Payer: Self-pay

## 2024-04-01 ENCOUNTER — Inpatient Hospital Stay (HOSPITAL_COMMUNITY)
Admission: EM | Admit: 2024-04-01 | Discharge: 2024-04-08 | DRG: 862 | Disposition: A | Discharge status: Rehabilitation Facility | Attending: Family Medicine | Admitting: Family Medicine

## 2024-04-01 ENCOUNTER — Other Ambulatory Visit (HOSPITAL_BASED_OUTPATIENT_CLINIC_OR_DEPARTMENT_OTHER): Payer: Self-pay

## 2024-04-01 ENCOUNTER — Encounter (HOSPITAL_COMMUNITY): Payer: Self-pay | Admitting: Emergency Medicine

## 2024-04-01 DIAGNOSIS — Z982 Presence of cerebrospinal fluid drainage device: Secondary | ICD-10-CM

## 2024-04-01 DIAGNOSIS — G039 Meningitis, unspecified: Secondary | ICD-10-CM | POA: Diagnosis not present

## 2024-04-01 DIAGNOSIS — Z79899 Other long term (current) drug therapy: Secondary | ICD-10-CM

## 2024-04-01 DIAGNOSIS — E66811 Obesity, class 1: Secondary | ICD-10-CM | POA: Diagnosis present

## 2024-04-01 DIAGNOSIS — F39 Unspecified mood [affective] disorder: Secondary | ICD-10-CM | POA: Diagnosis present

## 2024-04-01 DIAGNOSIS — N281 Cyst of kidney, acquired: Secondary | ICD-10-CM | POA: Diagnosis not present

## 2024-04-01 DIAGNOSIS — R5082 Postprocedural fever: Secondary | ICD-10-CM | POA: Diagnosis not present

## 2024-04-01 DIAGNOSIS — M5417 Radiculopathy, lumbosacral region: Secondary | ICD-10-CM | POA: Diagnosis present

## 2024-04-01 DIAGNOSIS — C719 Malignant neoplasm of brain, unspecified: Secondary | ICD-10-CM

## 2024-04-01 DIAGNOSIS — Z841 Family history of disorders of kidney and ureter: Secondary | ICD-10-CM

## 2024-04-01 DIAGNOSIS — G629 Polyneuropathy, unspecified: Secondary | ICD-10-CM | POA: Diagnosis present

## 2024-04-01 DIAGNOSIS — M545 Low back pain, unspecified: Secondary | ICD-10-CM | POA: Diagnosis not present

## 2024-04-01 DIAGNOSIS — F419 Anxiety disorder, unspecified: Secondary | ICD-10-CM | POA: Diagnosis present

## 2024-04-01 DIAGNOSIS — Z6834 Body mass index (BMI) 34.0-34.9, adult: Secondary | ICD-10-CM

## 2024-04-01 DIAGNOSIS — Y838 Other surgical procedures as the cause of abnormal reaction of the patient, or of later complication, without mention of misadventure at the time of the procedure: Secondary | ICD-10-CM | POA: Diagnosis present

## 2024-04-01 DIAGNOSIS — I10 Essential (primary) hypertension: Secondary | ICD-10-CM | POA: Diagnosis not present

## 2024-04-01 DIAGNOSIS — M5459 Other low back pain: Secondary | ICD-10-CM | POA: Diagnosis not present

## 2024-04-01 DIAGNOSIS — R609 Edema, unspecified: Secondary | ICD-10-CM | POA: Diagnosis not present

## 2024-04-01 DIAGNOSIS — D329 Benign neoplasm of meninges, unspecified: Secondary | ICD-10-CM | POA: Diagnosis present

## 2024-04-01 DIAGNOSIS — Z8041 Family history of malignant neoplasm of ovary: Secondary | ICD-10-CM

## 2024-04-01 DIAGNOSIS — K219 Gastro-esophageal reflux disease without esophagitis: Secondary | ICD-10-CM | POA: Diagnosis present

## 2024-04-01 DIAGNOSIS — I517 Cardiomegaly: Secondary | ICD-10-CM | POA: Diagnosis not present

## 2024-04-01 DIAGNOSIS — D496 Neoplasm of unspecified behavior of brain: Secondary | ICD-10-CM | POA: Diagnosis not present

## 2024-04-01 DIAGNOSIS — T8144XA Sepsis following a procedure, initial encounter: Secondary | ICD-10-CM | POA: Diagnosis not present

## 2024-04-01 DIAGNOSIS — J9811 Atelectasis: Secondary | ICD-10-CM | POA: Diagnosis not present

## 2024-04-01 DIAGNOSIS — G009 Bacterial meningitis, unspecified: Secondary | ICD-10-CM | POA: Diagnosis present

## 2024-04-01 DIAGNOSIS — A419 Sepsis, unspecified organism: Secondary | ICD-10-CM | POA: Diagnosis present

## 2024-04-01 DIAGNOSIS — F32A Depression, unspecified: Secondary | ICD-10-CM | POA: Diagnosis present

## 2024-04-01 DIAGNOSIS — Z9109 Other allergy status, other than to drugs and biological substances: Secondary | ICD-10-CM

## 2024-04-01 DIAGNOSIS — R509 Fever, unspecified: Secondary | ICD-10-CM | POA: Diagnosis not present

## 2024-04-01 DIAGNOSIS — N3289 Other specified disorders of bladder: Secondary | ICD-10-CM | POA: Diagnosis not present

## 2024-04-01 DIAGNOSIS — Z7982 Long term (current) use of aspirin: Secondary | ICD-10-CM

## 2024-04-01 DIAGNOSIS — M438X6 Other specified deforming dorsopathies, lumbar region: Secondary | ICD-10-CM | POA: Diagnosis not present

## 2024-04-01 DIAGNOSIS — Z808 Family history of malignant neoplasm of other organs or systems: Secondary | ICD-10-CM

## 2024-04-01 DIAGNOSIS — Z5309 Procedure and treatment not carried out because of other contraindication: Secondary | ICD-10-CM | POA: Diagnosis not present

## 2024-04-01 DIAGNOSIS — G9600 Cerebrospinal fluid leak, unspecified: Secondary | ICD-10-CM | POA: Diagnosis present

## 2024-04-01 DIAGNOSIS — Y95 Nosocomial condition: Secondary | ICD-10-CM | POA: Diagnosis present

## 2024-04-01 DIAGNOSIS — Z833 Family history of diabetes mellitus: Secondary | ICD-10-CM

## 2024-04-01 DIAGNOSIS — R0989 Other specified symptoms and signs involving the circulatory and respiratory systems: Secondary | ICD-10-CM | POA: Diagnosis not present

## 2024-04-01 DIAGNOSIS — Z8249 Family history of ischemic heart disease and other diseases of the circulatory system: Secondary | ICD-10-CM

## 2024-04-01 DIAGNOSIS — Z1152 Encounter for screening for COVID-19: Secondary | ICD-10-CM

## 2024-04-01 DIAGNOSIS — M5416 Radiculopathy, lumbar region: Secondary | ICD-10-CM | POA: Diagnosis present

## 2024-04-01 LAB — COMPREHENSIVE METABOLIC PANEL WITH GFR
ALT: 11 U/L (ref 0–44)
AST: 13 U/L — ABNORMAL LOW (ref 15–41)
Albumin: 3.2 g/dL — ABNORMAL LOW (ref 3.5–5.0)
Alkaline Phosphatase: 47 U/L (ref 38–126)
Anion gap: 11 (ref 5–15)
BUN: <5 mg/dL — ABNORMAL LOW (ref 6–20)
CO2: 25 mmol/L (ref 22–32)
Calcium: 9.1 mg/dL (ref 8.9–10.3)
Chloride: 98 mmol/L (ref 98–111)
Creatinine, Ser: 0.77 mg/dL (ref 0.44–1.00)
GFR, Estimated: >60 mL/min (ref >60)
Glucose, Bld: 106 mg/dL — ABNORMAL HIGH (ref 70–99)
Potassium: 3.5 mmol/L (ref 3.5–5.1)
Sodium: 134 mmol/L — ABNORMAL LOW (ref 135–145)
Total Bilirubin: 0.2 mg/dL (ref 0.0–1.2)
Total Protein: 7.1 g/dL (ref 6.5–8.1)

## 2024-04-01 LAB — CBC WITH DIFFERENTIAL/PLATELET
Abs Immature Granulocytes: 0.05 K/uL (ref 0.00–0.07)
Basophils Absolute: 0 K/uL (ref 0.0–0.1)
Basophils Relative: 0 %
Eosinophils Absolute: 0.1 K/uL (ref 0.0–0.5)
Eosinophils Relative: 1 %
HCT: 26.4 % — ABNORMAL LOW (ref 36.0–46.0)
Hemoglobin: 8.4 g/dL — ABNORMAL LOW (ref 12.0–15.0)
Immature Granulocytes: 1 %
Lymphocytes Relative: 23 %
Lymphs Abs: 2 K/uL (ref 0.7–4.0)
MCH: 28.1 pg (ref 26.0–34.0)
MCHC: 31.8 g/dL (ref 30.0–36.0)
MCV: 88.3 fL (ref 80.0–100.0)
Monocytes Absolute: 1.1 K/uL — ABNORMAL HIGH (ref 0.1–1.0)
Monocytes Relative: 13 %
Neutro Abs: 5.6 K/uL (ref 1.7–7.7)
Neutrophils Relative %: 62 %
Platelets: 559 K/uL — ABNORMAL HIGH (ref 150–400)
RBC: 2.99 MIL/uL — ABNORMAL LOW (ref 3.87–5.11)
RDW: 12.8 % (ref 11.5–15.5)
WBC: 8.9 K/uL (ref 4.0–10.5)
nRBC: 0 % (ref 0.0–0.2)

## 2024-04-01 LAB — PROTIME-INR
INR: 1.1 (ref 0.8–1.2)
Prothrombin Time: 14.4 s (ref 11.4–15.2)

## 2024-04-01 LAB — URINALYSIS, W/ REFLEX TO CULTURE (INFECTION SUSPECTED)
Bacteria, UA: NONE SEEN
Bilirubin Urine: NEGATIVE
Glucose, UA: NEGATIVE mg/dL
Hgb urine dipstick: NEGATIVE
Ketones, ur: NEGATIVE mg/dL
Leukocytes,Ua: NEGATIVE
Nitrite: NEGATIVE
Protein, ur: NEGATIVE mg/dL
Specific Gravity, Urine: 1.014 (ref 1.005–1.030)
pH: 5 (ref 5.0–8.0)

## 2024-04-01 LAB — I-STAT CG4 LACTIC ACID, ED: Lactic Acid, Venous: 0.8 mmol/L (ref 0.5–1.9)

## 2024-04-01 LAB — RESP PANEL BY RT-PCR (RSV, FLU A&B, COVID)  RVPGX2
Influenza A by PCR: NEGATIVE
Influenza B by PCR: NEGATIVE
Resp Syncytial Virus by PCR: NEGATIVE
SARS Coronavirus 2 by RT PCR: NEGATIVE

## 2024-04-01 MED ORDER — VANCOMYCIN HCL 2000 MG/400ML IV SOLN
2000.0000 mg | Freq: Once | INTRAVENOUS | Status: AC
  Administered 2024-04-02: 2000 mg via INTRAVENOUS
  Filled 2024-04-01: qty 400

## 2024-04-01 MED ORDER — ONDANSETRON HCL 4 MG/2ML IJ SOLN
4.0000 mg | Freq: Once | INTRAMUSCULAR | Status: AC
  Administered 2024-04-01: 4 mg via INTRAVENOUS
  Filled 2024-04-01: qty 2

## 2024-04-01 MED ORDER — GABAPENTIN 300 MG PO CAPS
900.0000 mg | ORAL_CAPSULE | Freq: Every day | ORAL | 0 refills | Status: DC
  Filled 2024-04-01: qty 93, 31d supply, fill #0

## 2024-04-01 MED ORDER — LIDOCAINE HCL 2 % IJ SOLN
INTRAMUSCULAR | Status: AC
  Filled 2024-04-01: qty 20

## 2024-04-01 MED ORDER — IOHEXOL 350 MG/ML SOLN
75.0000 mL | Freq: Once | INTRAVENOUS | Status: AC | PRN
  Administered 2024-04-01: 75 mL via INTRAVENOUS

## 2024-04-01 MED ORDER — LACTATED RINGERS IV BOLUS
1000.0000 mL | Freq: Once | INTRAVENOUS | Status: AC
  Administered 2024-04-02: 1000 mL via INTRAVENOUS

## 2024-04-01 MED ORDER — LACTATED RINGERS IV SOLN: INTRAVENOUS | Status: DC

## 2024-04-01 MED ORDER — SODIUM CHLORIDE 0.9 % IV SOLN
2.0000 g | Freq: Three times a day (TID) | INTRAVENOUS | Status: DC
  Administered 2024-04-01 – 2024-04-08 (×21): 2 g via INTRAVENOUS
  Filled 2024-04-01 (×21): qty 12.5

## 2024-04-01 MED ORDER — MORPHINE SULFATE (PF) 4 MG/ML IV SOLN
4.0000 mg | Freq: Once | INTRAVENOUS | Status: AC
  Administered 2024-04-01: 4 mg via INTRAVENOUS
  Filled 2024-04-01: qty 1

## 2024-04-01 MED ORDER — IBUPROFEN 800 MG PO TABS
800.0000 mg | ORAL_TABLET | Freq: Once | ORAL | Status: AC
  Administered 2024-04-01: 800 mg via ORAL
  Filled 2024-04-01: qty 1

## 2024-04-01 MED ORDER — GADOBUTROL 1 MMOL/ML IV SOLN
10.0000 mL | Freq: Once | INTRAVENOUS | Status: AC | PRN
  Administered 2024-04-01: 10 mL via INTRAVENOUS

## 2024-04-01 NOTE — H&P (Signed)
 History and Physical    Alexa [name] FMW:[id number] DOB: [birth year] DOA: 04/01/2024  PCP: Patient, No Pcp Per   Patient coming from: Home   Chief Complaint:  Chief Complaint  Patient presents with   Back Pain    HPI:  Alexa [name] is a 53 y.o. female with hx of HTN, mood d/o, recent Meningioma resection 7/29 + 7/30 with Dr. Gillie and was readmitted 8/17 with CSF leak from craniotomy site, and underwent placement of a lumbar drain on 8/18 but recurrent CSF leak after drain was clamped and ultimately underwent placement of a VP shunt on 8/24, with laparoscopic access for VP shunt insertion with Dr. Stevie, who now presents with severe low back pain and imaging concerning for spinal meningitis, I/s/o recent neurosurgical procedures/healthcare associated with associated sepsis. ***    Review of Systems:  ROS complete and negative except as marked above   Allergies  Allergen Reactions   Watermelon [Citrullus Vulgaris] Nausea And Vomiting    Prior to Admission medications   Medication Sig Start Date End Date Taking? Authorizing Provider  acetaminophen  (TYLENOL ) 325 MG tablet Take 325 mg by mouth every 6 (six) hours as needed for moderate pain (pain score 4-6).    [provider]  aspirin-acetaminophen -caffeine (EXCEDRIN MIGRAINE) 250-250-65 MG tablet Take 1 tablet by mouth every 6 (six) hours as needed for headache.    [provider]  atenolol  (TENORMIN ) 50 MG tablet Take 1 tablet (50 mg total) by mouth daily. 05/21/23     BLACK COHOSH  PO Take 700 mg by mouth daily.    [provider]  buPROPion  (WELLBUTRIN  SR) 150 MG 12 hr tablet Take 1 tablet (150 mg total) by mouth daily. 10/16/23   Geralene Kaiser, MD  cyclobenzaprine  (FLEXERIL ) 10 MG tablet Take 1 tablet (10 mg total) by mouth 3 (three) times daily as needed. 03/26/24     escitalopram  (LEXAPRO ) 20 MG tablet Take 2 tablets (40 mg total) by mouth daily. 10/16/23   Geralene Kaiser, MD  gabapentin   (NEURONTIN ) 300 MG capsule Take 300mg  (1 capsule) by mouth once today, then 600 mg (2 capsules) tomorrow, then 900 mg (3 capsules) each day afterwards 03/30/24     hydrochlorothiazide  (HYDRODIURIL ) 12.5 MG tablet Take 1 tablet (12.5 mg total) by mouth daily. 05/21/23     HYDROcodone -acetaminophen  (NORCO/VICODIN) 5-325 MG tablet Take 1 tablet by mouth every 6 (six) hours as needed for up to 8 days for moderate pain (pain score 4-6). 03/25/24 04/02/24  Gillie Duncans, MD  HYDROcodone -acetaminophen  (NORCO/VICODIN) 5-325 MG tablet Take 1 tablet by mouth every 6 (six) hours as needed for pain. 03/26/24     ibuprofen  (ADVIL ,MOTRIN ) 200 MG tablet Take 200-400 mg by mouth 2 (two) times daily as needed for headache or moderate pain.    [provider]  OVER THE COUNTER MEDICATION Take 2 capsules by mouth daily. Emma supplement    [provider]  SODIUM FLUORIDE , DENTAL RINSE, (PREVIDENT ) 0.2 % SOLN USE AS AN ORAL RINSE, AS DIRECTED ON PACKAGE Patient taking differently: 1 Dose by Mouth Rinse route daily as needed. 12/13/23       Past Medical History:  Diagnosis Date   Anemia    Anxiety    Depression    GERD (gastroesophageal reflux disease)    Headache    Hypertension    Meningioma (HCC)    PONV (postoperative nausea and vomiting)     Past Surgical History:  Procedure Laterality Date   APPLICATION OF CRANIAL NAVIGATION  N/A 02/21/2024   Procedure: COMPUTER-ASSISTED NAVIGATION, FOR CRANIAL PROCEDURE;  Surgeon: Gillie Duncans, MD;  Location: [facility] OR;  Service: Neurosurgery;  Laterality: N/A;   CRANIOTOMY  05/26/2016   Procedure: SUBOCCIPITAL CRANIOTOMY WITH PLACEMENT OF VENTRICULAR CATHETER;  Surgeon: Duncans Gillie, MD;  Location: [facility] OR;  Service: Neurosurgery;;  CRANIOTOMY - SUBOCCIPITAL   CRANIOTOMY Left 02/21/2024   Procedure: CRANIOTOMY TUMOR EXCISION;  Surgeon: Gillie Duncans, MD;  Location: [facility] OR;  Service: Neurosurgery;  Laterality: Left;  Occipital - left Craniotomy for tumor resection  with Stealth   CRANIOTOMY Left 02/26/2024   Procedure: CRANIOTOMY TUMOR EXCISION;  Surgeon: Gillie Duncans, MD;  Location: [facility] OR;  Service: Neurosurgery;  Laterality: Left;  Craniotomy for tumor Stage II   LUMBAR WOUND DEBRIDEMENT N/A 03/18/2024   Procedure: WOUND DEBRIDEMENT;  Surgeon: Gillie Duncans, MD;  Location: [facility] OR;  Service: Neurosurgery;  Laterality: N/A;  Wound revision at cranial site   PLACEMENT OF LUMBAR DRAIN N/A 03/18/2024   Procedure: PLACEMENT OF LUMBAR DRAIN;  Surgeon: Gillie Duncans, MD;  Location: [facility] OR;  Service: Neurosurgery;  Laterality: N/A;  Placement of Lumbar Drain   VENTRICULOPERITONEAL SHUNT Right 03/24/2024   Procedure: SHUNT INSERTION VENTRICULAR-PERITONEAL With Laparoscopic verification of shunt placement into the peritoneum;  Surgeon: Gillie Duncans, MD;  Location: [facility] OR;  Service: Neurosurgery;  Laterality: Right;   WISDOM TOOTH EXTRACTION       reports that she has never smoked. She has never used smokeless tobacco. She reports current alcohol  use. She reports that she does not use drugs.  Family History  Problem Relation Age of Onset   Hypertension Mother    Heart disease Mother    Hypertension Father    Kidney disease Father    Ulcers Father    Diabetes Father        many paternal family members also with diabetes   Ovarian cancer Maternal Grandmother    Cirrhosis Paternal Grandfather        alcohol  related   Thyroid  cancer Maternal Aunt    Colon cancer Neg Hx    Esophageal cancer Neg Hx    Pancreatic cancer Neg Hx    Stomach cancer Neg Hx      Physical Exam: Vitals:   04/01/24 1915 04/01/24 2030 04/01/24 2100 04/01/24 2131  BP:  134/85 139/86   Pulse:  98 96   Resp:  19 17   Temp: (!) 100.5 F (38.1 C)   99.5 F (37.5 C)  TempSrc: Bladder   Bladder  SpO2:  96% 97%   Weight:      Height:        Gen: Awake, alert, NAD ***  CV: Regular, normal S1, S2, no murmurs  Resp: Normal WOB, CTAB  Abd: Flat, normoactive, nontender MSK: Symmetric,  no edema  Skin: No rashes or lesions to exposed skin  Neuro: Alert and interactive  Psych: euthymic, appropriate    Data review:   Labs reviewed, notable for:   Chemistries unremarkable  Lactate 0.8  WBC 8.9   Micro:  Results for orders placed or performed during the hospital encounter of 04/01/24  Resp panel by RT-PCR (RSV, Flu A&B, Covid) Anterior Nasal Swab     Status: None   Collection Time: 04/01/24  4:04 PM   Specimen: Anterior Nasal Swab  Result Value Ref Range Status   SARS Coronavirus 2 by RT PCR NEGATIVE NEGATIVE Final   Influenza A by PCR NEGATIVE NEGATIVE Final   Influenza B by PCR NEGATIVE NEGATIVE Final  Comment: (NOTE) The Xpert Xpress SARS-CoV-2/FLU/RSV plus assay is intended as an aid in the diagnosis of influenza from Nasopharyngeal swab specimens and should not be used as a sole basis for treatment. Nasal washings and aspirates are unacceptable for Xpert Xpress SARS-CoV-2/FLU/RSV testing.  Fact Sheet for Patients: BloggerCourse.com  Fact Sheet for Healthcare Providers: SeriousBroker.it  This test is not yet approved or cleared by the [place]  FDA and has been authorized for detection and/or diagnosis of SARS-CoV-2 by FDA under an Emergency Use Authorization (EUA). This EUA will remain in effect (meaning this test can be used) for the duration of the COVID-19 declaration under Section 564(b)(1) of the Act, 21 U.S.C. section 360bbb-3(b)(1), unless the authorization is terminated or revoked.     Resp Syncytial Virus by PCR NEGATIVE NEGATIVE Final    Comment: (NOTE) Fact Sheet for Patients: BloggerCourse.com  Fact Sheet for Healthcare Providers: SeriousBroker.it  This test is not yet approved or cleared by the [place]  FDA and has been authorized for detection and/or diagnosis of SARS-CoV-2 by FDA under an Emergency Use Authorization  (EUA). This EUA will remain in effect (meaning this test can be used) for the duration of the COVID-19 declaration under Section 564(b)(1) of the Act, 21 U.S.C. section 360bbb-3(b)(1), unless the authorization is terminated or revoked.  Performed at [facility], [address], [place], [place] [address]     Imaging reviewed:  MR Lumbar Spine W Wo Contrast Result Date: 04/01/2024 EXAM: MR Lumbar Spine with and without intravenous contrast. 04/01/2024 06:22:00 PM TECHNIQUE: Multiplanar multisequence MRI of the lumbar spine was performed with and without the administration of intravenous contrast. COMPARISON: None available CLINICAL HISTORY: Low back pain, symptoms persist with > 6 weeks treatment; fever, right foot paresthesia, severe midline back pain. Lumbar drain for CSF placed on 03/18/24. Patient has a history of brain tumor removal in July and recent hospitalization. FINDINGS: BONES AND ALIGNMENT: Rightward curvature is centered at L4. Normal vertebral body heights. Normal bone marrow signal. No acute fracture. SPINAL CORD: The conus terminates normally. Dural enhancement is present along the lower spinal cord. SOFT TISSUES: No acute abnormality. L1-L2: No disc herniation. No spinal canal stenosis or neural foraminal narrowing. L2-L3: No disc herniation. No spinal canal stenosis or neural foraminal narrowing. L3-L4: No disc herniation. No spinal canal stenosis or neural foraminal narrowing. L4-L5: No disc herniation. No spinal canal stenosis or neural foraminal narrowing. L5-S1: No disc herniation. No spinal canal stenosis or neural foraminal narrowing. A heterogeneously enhancing mass lesion posterior to the S2 vertebral body measures 10 x 11 x 26 mm. Ill-defined intrathecal enhancement is present in the lower lumbar spine. IMPRESSION: 1. Heterogeneously enhancing mass lesion posterior to the S2 vertebral body measuring 10 x 11 x 26 mm. This is an atypical location but most consistent with an  appendicitis. 2. Ill-defined intrathecal enhancement in the lower lumbar spine and dural enhancement along the lower spinal cord. Findings are concerning for infection. Recommend lumbar puncture. Electronically signed by: Lonni Necessary MD 04/01/2024 07:23 PM EDT RP Workstation: [id number]   DG Chest Port 1 View Result Date: 04/01/2024 EXAM: 1 VIEW XRAY OF THE CHEST 04/01/2024 04:10:00 PM COMPARISON: None available. CLINICAL HISTORY: Questionable sepsis - evaluate for abnormality. Reason for exam: questionable sepsis - eval for abnormality. Patient had a back brace that was unable to come off for x-ray. FINDINGS: LUNGS AND PLEURA: Low lung volumes. Mild bibasilar atelectasis. No overt pulmonary edema. No acute consolidative airspace disease. No pleural effusion. No  pneumothorax. HEART AND MEDIASTINUM: Stable mild cardiomegaly. BONES AND SOFT TISSUES: S-shaped thoracolumbar spinal curvature. No acute osseous abnormality. LINES AND TUBES: Intact VP shunt tubing over right neck and chest in appropriate position. IMPRESSION: 1. Low lung volumes with mild bibasilar atelectasis. 2. Stable mild cardiomegaly without overt edema. Electronically signed by: Selinda Blue MD 04/01/2024 04:49 PM EDT RP Workstation: [id number]    EKG:  Personally reviewed, sinus rhythm, PWRP, T wave inversion in septal leads,   ED Course:  L spine MR concerning for intrathecal enhancement in the lower spine and dural enhancement along the lower cord concerning for infection, rads recommending for LP. EDP had discussed case and plan for LP with neurosurgery, who recommend admission to medicine and will follow as consultants, in agreement with abx plan / additional imaging.    Assessment/Plan:  53 y.o. female with hx HTN, mood d/o, recent Meningioma resection 7/29 + 7/30 with Dr. Gillie and was readmitted 8/17 with CSF leak from craniotomy site, and underwent placement of a lumbar drain on 8/18 but recurrent CSF leak after drain  was clamped and ultimately underwent placement of a VP shunt on 8/24, with laparoscopic access for VP shunt insertion with Dr. Stevie, who now presents with severe low back pain and imaging concerning for spinal meningitis, I/s/o recent neurosurgical procedures/healthcare associated with associated sepsis.   Healthcare associated spinal meningitis, in setting of recent neurosurgical procedures  HX recent Meningioma resection 7/29, redo 7/30  Hx recent CSF leak from craniectomy, Lumbar drain 8/18, + VP shunt with laparoscopic placement on 8/24  P/w ***. On initial evaluation tachycardic in the low 100s, febrile to Tmax 38.8 C. WBC 8.9, Lactate 0.8. L spine MRI w wo contrast demonstrating intrathecal ehancement in the lower spine and dural enhancement along the lower cord concerning for infection, rads recommending for LP  -- EDP has consulted with Dr. Meryl (NSGY) and plan for LP who recommend admission to medicine and will follow as consultants, in agreement with abx plan / additional imaging.  -- After LP is complete / attempted, start on Vancomycin  pharm to dose, and Cefepime  2 g IV q 8 hr considering recent instrumentation.  -- IR for LP if ED unable to gain access, ***  -- Obtain CT head with and without contrast, and CT A/P with contrast considering recent surgical history; will wait on admission until these result  -- F/u CSF labs, CSF Cx, Bcx.  -- Give 1 L of LR, and continue mIVF at 100 cc /hr. Can hold on full 30 ml/kg as lactate nml, HR improving.  -- Serial neurochecks of the lower extremities.   R foot paresthesia  Per chart review began after lumbar drain on 8/18 *** Exam  -- ***   Incidental findings:  ? Ependymoma, 10 x 11 x 26 mm heterogenous mass posterior to S2: NSGY may manage and followup on this as well    Chronic medical problems:  HTN: Continue home Atenolol . Hold home hydrochlorothiazide  I/s/o sepsis.  Mood d/o: *** bupropion  / escitalopram    Body mass index is  31.93 kg/m. Obesity class I would benefit from weight loss outpatient  DVT prophylaxis:  SCDs Code Status:  {Blank single:19197::Full Code,DNR with Intubation,DNR/DNI(Do NOT Intubate),Comfort Care,***} Diet:  Diet Orders (From admission, onward)     Start     Ordered   04/01/24 1544  Diet NPO time specified  (Septic presentation on arrival (screening labs, nursing and treatment orders for obvious sepsis))  Diet effective now  04/01/24 1547           Family Communication:  ***  Consults:  ***  Admission status:   {Blank single:19197::Observation,Inpatient}, {Blank single:19197::Med-Surg,Telemetry bed,Step Down Unit}  Severity of Illness: {Observation/Inpatient:21159}   Dorn Dawson, MD [facility]  How to contact the [facility] Attending or Consulting provider 7A - 7P or covering provider during after hours 7P -7A, for this patient.  Check the care team in [facility] and look for a) attending/consulting [facility] provider listed and b) the TR[facility] team listed Log into [email or web address] and use [place]'s universal password to access. If you do not have the password, please contact the hospital operator. Locate the TR[facility] provider you are looking for under [facility] and page to a number that you can be directly reached. If you still have difficulty reaching the provider, please page the [facility] (Director on Call) for the Hospitalists listed on amion for assistance.  04/01/2024, 9:39 PM

## 2024-04-01 NOTE — Progress Notes (Signed)
 Pharmacy Antibiotic Note  Alexa [name] is a 53 y.o. female for which pharmacy has been consulted for vancomycin  dosing for sepsis/meningitis r/o.  Patient with a history of recent meningioma resection w/ subsequent readmission with CSF leak from craniotomy site now is s/p lumbar drain on 8/18 and placement of a VP shunt on 8/24.   Patient presenting with severe low back pain. Imaging concerning for likely developing epidural abscess / spinal meningitis.  SCr 0.77 WBC 8.9; LA 0.8; T 99.5; HR 105>96; RR 17 COVID neg / flu neg; blood cultures collected MRI: intrathecal enhancement in the lower lumbar spine and dural enhancement along the lower spinal cord LP to r/o meningitis  Plan: Cefepime  2g IV every 8 hours Vancomycin  2000 mg x 1 then 1000 mg q8h (VT 15-20) Levels at steady state Monitor WBC, fever, renal function, cultures to de-escalate when able F/u LP with IR in AM  Height: 5' 8 (172.7 cm) Weight: 95.3 kg (210 lb) IBW/kg (Calculated) : 63.9  Temp (24hrs), Avg:100.9 F (38.3 C), Min:99.5 F (37.5 C), Max:101.9 F (38.8 C)  Recent Labs  Lab 04/01/24 1544 04/01/24 1610  WBC 8.9  --   CREATININE 0.77  --   LATICACIDVEN  --  0.8    Estimated Creatinine Clearance: 98.2 mL/min (by C-G formula based on SCr of 0.77 mg/dL).    Allergies  Allergen Reactions   Watermelon [Citrullus Vulgaris] Nausea And Vomiting   Microbiology results: 9/1 Bcx: 9/1 CSF Cx:   Thank you for allowing pharmacy to be a part of this patient's care.  Lynwood Poplar, PharmD, BCPS Clinical Pharmacist 04/01/2024 10:35 PM

## 2024-04-01 NOTE — ED Provider Notes (Signed)
 [place] EMERGENCY DEPARTMENT AT [facility] Provider Note   CSN: [id number] Arrival date & time: 04/01/24  1428     Patient presents with: Back Pain   Alexa [name] is a 53 y.o. femalewith an extensive past medical history here with complaint of severe lumbar back pain.  Patient was recently admitted to the hospital with a CSF leak.  She underwent multiple surgeries 1 of which was a lumbar drain placement on 03/18/2024.  Patient also had shunt placement on 03/24/2024.  Patient reports progressively worsening lumbar back pain over the past few days new onset right sided foot paresthesia.  Her family member also reports that today she had a fever up to 100.5.  Patient reports severe lumbar pain out of proportion to previous pain.  She denies any other symptoms such as urinary symptoms runny nose cough abdominal pain nausea vomiting.    Back Pain      Prior to Admission medications   Medication Sig Start Date End Date Taking? Authorizing Provider  acetaminophen  (TYLENOL ) 325 MG tablet Take 325 mg by mouth every 6 (six) hours as needed for moderate pain (pain score 4-6).    [provider]  aspirin-acetaminophen -caffeine (EXCEDRIN MIGRAINE) 250-250-65 MG tablet Take 1 tablet by mouth every 6 (six) hours as needed for headache.    [provider]  atenolol  (TENORMIN ) 50 MG tablet Take 1 tablet (50 mg total) by mouth daily. 05/21/23     BLACK COHOSH  PO Take 700 mg by mouth daily.    [provider]  buPROPion  (WELLBUTRIN  SR) 150 MG 12 hr tablet Take 1 tablet (150 mg total) by mouth daily. 10/16/23   Geralene Kaiser, MD  cyclobenzaprine  (FLEXERIL ) 10 MG tablet Take 1 tablet (10 mg total) by mouth 3 (three) times daily as needed. 03/26/24     escitalopram  (LEXAPRO ) 20 MG tablet Take 2 tablets (40 mg total) by mouth daily. 10/16/23   Geralene Kaiser, MD  gabapentin  (NEURONTIN ) 300 MG capsule Take 300mg  (1 capsule) by mouth once today, then 600 mg (2 capsules)  tomorrow, then 900 mg (3 capsules) each day afterwards 03/30/24     hydrochlorothiazide  (HYDRODIURIL ) 12.5 MG tablet Take 1 tablet (12.5 mg total) by mouth daily. 05/21/23     HYDROcodone -acetaminophen  (NORCO/VICODIN) 5-325 MG tablet Take 1 tablet by mouth every 6 (six) hours as needed for up to 8 days for moderate pain (pain score 4-6). 03/25/24 04/02/24  Gillie Duncans, MD  HYDROcodone -acetaminophen  (NORCO/VICODIN) 5-325 MG tablet Take 1 tablet by mouth every 6 (six) hours as needed for pain. 03/26/24     ibuprofen  (ADVIL ,MOTRIN ) 200 MG tablet Take 200-400 mg by mouth 2 (two) times daily as needed for headache or moderate pain.    [provider]  OVER THE COUNTER MEDICATION Take 2 capsules by mouth daily. Emma supplement    [provider]  SODIUM FLUORIDE , DENTAL RINSE, (PREVIDENT ) 0.2 % SOLN USE AS AN ORAL RINSE, AS DIRECTED ON PACKAGE Patient taking differently: 1 Dose by Mouth Rinse route daily as needed. 12/13/23       Allergies: Watermelon [citrullus vulgaris]    Review of Systems  Musculoskeletal:  Positive for back pain.    Updated Vital Signs BP 139/86   Pulse 96   Temp 99.5 F (37.5 C) (Bladder)   Resp 17   Ht 5' 8 (1.727 m)   Wt 95.3 kg   SpO2 97%   BMI 31.93 kg/m   Physical Exam Vitals and nursing note reviewed.  Constitutional:      General: She is not in acute distress.    Appearance: She is well-developed. She is ill-appearing. She is not diaphoretic.  HENT:     Head: Normocephalic and atraumatic.     Right Ear: External ear normal.     Left Ear: External ear normal.     Nose: Nose normal.     Mouth/Throat:     Mouth: Mucous membranes are moist.  Eyes:     General: No scleral icterus.    Conjunctiva/sclera: Conjunctivae normal.  Cardiovascular:     Rate and Rhythm: Normal rate and regular rhythm.     Heart sounds: Normal heart sounds. No murmur heard.    No friction rub. No gallop.  Pulmonary:     Effort: Pulmonary effort is normal. No  respiratory distress.     Breath sounds: Normal breath sounds.  Abdominal:     General: Bowel sounds are normal. There is no distension.     Palpations: Abdomen is soft. There is no mass.     Tenderness: There is no abdominal tenderness. There is no guarding.  Musculoskeletal:     Cervical back: Normal range of motion.     Comments: There is a single staple on the left lumbar paraspinal region with no evidence of infection.  Patient's pain is out of proportion to physical examination.  She has severe midline spinal tenderness with palpation.  It takes the patient a significant amount of time to turn onto her side for evaluation.  Skin:    General: Skin is warm and dry.  Neurological:     Mental Status: She is alert and oriented to person, place, and time.     Motor: No weakness.  Psychiatric:        Behavior: Behavior normal.     (all labs ordered are listed, but only abnormal results are displayed) Labs Reviewed  COMPREHENSIVE METABOLIC PANEL WITH GFR - Abnormal; Notable for the following components:      Result Value   Sodium 134 (*)    Glucose, Bld 106 (*)    BUN <5 (*)    Albumin  3.2 (*)    AST 13 (*)    All other components within normal limits  CBC WITH DIFFERENTIAL/PLATELET - Abnormal; Notable for the following components:   RBC 2.99 (*)    Hemoglobin 8.4 (*)    HCT 26.4 (*)    Platelets 559 (*)    Monocytes Absolute 1.1 (*)    All other components within normal limits  RESP PANEL BY RT-PCR (RSV, FLU A&B, COVID)  RVPGX2  CULTURE, BLOOD (ROUTINE X 2)  CULTURE, BLOOD (ROUTINE X 2)  PROTIME-INR  URINALYSIS, W/ REFLEX TO CULTURE (INFECTION SUSPECTED)  BASIC METABOLIC PANEL WITH GFR  CBC  MAGNESIUM   PHOSPHORUS  I-STAT CG4 LACTIC ACID, ED    EKG: None  Radiology: MR Lumbar Spine W Wo Contrast Result Date: 04/01/2024 EXAM: MR Lumbar Spine with and without intravenous contrast. 04/01/2024 06:22:00 PM TECHNIQUE: Multiplanar multisequence MRI of the lumbar spine was  performed with and without the administration of intravenous contrast. COMPARISON: None available CLINICAL HISTORY: Low back pain, symptoms persist with > 6 weeks treatment; fever, right foot paresthesia, severe midline back pain. Lumbar drain for CSF placed on 03/18/24. Patient has a history of brain tumor removal in July and recent hospitalization. FINDINGS: BONES AND ALIGNMENT: Rightward curvature is centered at L4. Normal vertebral body heights. Normal bone marrow signal. No acute fracture. SPINAL CORD: The conus  terminates normally. Dural enhancement is present along the lower spinal cord. SOFT TISSUES: No acute abnormality. L1-L2: No disc herniation. No spinal canal stenosis or neural foraminal narrowing. L2-L3: No disc herniation. No spinal canal stenosis or neural foraminal narrowing. L3-L4: No disc herniation. No spinal canal stenosis or neural foraminal narrowing. L4-L5: No disc herniation. No spinal canal stenosis or neural foraminal narrowing. L5-S1: No disc herniation. No spinal canal stenosis or neural foraminal narrowing. A heterogeneously enhancing mass lesion posterior to the S2 vertebral body measures 10 x 11 x 26 mm. Ill-defined intrathecal enhancement is present in the lower lumbar spine. IMPRESSION: 1. Heterogeneously enhancing mass lesion posterior to the S2 vertebral body measuring 10 x 11 x 26 mm. This is an atypical location but most consistent with an appendicitis. 2. Ill-defined intrathecal enhancement in the lower lumbar spine and dural enhancement along the lower spinal cord. Findings are concerning for infection. Recommend lumbar puncture. Electronically signed by: Lonni Necessary MD 04/01/2024 07:23 PM EDT RP Workstation: [id number]   DG Chest Port 1 View Result Date: 04/01/2024 EXAM: 1 VIEW XRAY OF THE CHEST 04/01/2024 04:10:00 PM COMPARISON: None available. CLINICAL HISTORY: Questionable sepsis - evaluate for abnormality. Reason for exam: questionable sepsis - eval for  abnormality. Patient had a back brace that was unable to come off for x-ray. FINDINGS: LUNGS AND PLEURA: Low lung volumes. Mild bibasilar atelectasis. No overt pulmonary edema. No acute consolidative airspace disease. No pleural effusion. No pneumothorax. HEART AND MEDIASTINUM: Stable mild cardiomegaly. BONES AND SOFT TISSUES: S-shaped thoracolumbar spinal curvature. No acute osseous abnormality. LINES AND TUBES: Intact VP shunt tubing over right neck and chest in appropriate position. IMPRESSION: 1. Low lung volumes with mild bibasilar atelectasis. 2. Stable mild cardiomegaly without overt edema. Electronically signed by: Selinda Blue MD 04/01/2024 04:49 PM EDT RP Workstation: [id number]     Lumbar Puncture  Date/Time: 04/01/2024 10:49 PM  Performed by: Arloa Chroman, PA-C Authorized by: Arloa Chroman, PA-C   Consent:    Consent obtained:  Verbal   Consent given by:  Patient   Risks discussed:  Bleeding, infection, headache, nerve damage, pain and repeat procedure   Alternatives discussed:  No treatment Universal protocol:    Patient identity confirmed:  Arm band Pre-procedure details:    Procedure purpose:  Diagnostic   Preparation: Patient was prepped and draped in usual sterile fashion   Anesthesia:    Anesthesia method:  Local infiltration   Local anesthetic:  Lidocaine  2% w/o epi Procedure details:    Lumbar space: attempted at L3/4 and L4/5.   Patient position:  L lateral decubitus   Needle gauge:  18   Needle type:  Spinal needle - Quincke tip   Needle length (in):  5.0   Number of attempts:  3 Post-procedure details:    Puncture site:  Adhesive bandage applied   Procedure completion:  Tolerated well, no immediate complications Comments:     Multiple attempts without access to CSF .Critical Care  Performed by: Arloa Chroman, PA-C Authorized by: Arloa Chroman, PA-C   Critical care provider statement:    Critical care time (minutes):  75   Critical care time was  exclusive of:  Separately billable procedures and treating other patients   Critical care was necessary to treat or prevent imminent or life-threatening deterioration of the following conditions:  CNS failure or compromise   Critical care was time spent personally by me on the following activities:  Development of treatment plan with patient or surrogate, discussions with consultants,  evaluation of patient's response to treatment, interpretation of cardiac output measurements, re-evaluation of patient's condition, pulse oximetry, ordering and review of radiographic studies, ordering and review of laboratory studies, ordering and performing treatments and interventions and obtaining history from patient or surrogate   Care discussed with: admitting provider      Medications Ordered in the ED  lactated ringers  infusion (0 mLs Intravenous Stopped 04/01/24 1851)  ceFEPIme  (MAXIPIME ) 2 g in sodium chloride  0.9 % 100 mL IVPB (has no administration in time range)  lactated ringers  bolus 1,000 mL (has no administration in time range)  lidocaine  (XYLOCAINE ) 2 % (with pres) injection (has no administration in time range)  vancomycin  (VANCOREADY) IVPB 2000 mg/400 mL (has no administration in time range)  morphine  (PF) 4 MG/ML injection 4 mg (4 mg Intravenous Given 04/01/24 1618)  ondansetron  (ZOFRAN ) injection 4 mg (4 mg Intravenous Given 04/01/24 1618)  ibuprofen  (ADVIL ) tablet 800 mg (800 mg Oral Given 04/01/24 1709)  gadobutrol  (GADAVIST ) 1 MMOL/ML injection 10 mL (10 mLs Intravenous Contrast Given 04/01/24 1824)  morphine  (PF) 4 MG/ML injection 4 mg (4 mg Intravenous Given 04/01/24 2126)  iohexol  (OMNIPAQUE ) 350 MG/ML injection 75 mL (75 mLs Intravenous Contrast Given 04/01/24 2324)    Clinical Course as of 04/01/24 2333  Mon Apr 01, 2024  1609 Case discussed with Dr. Darnella of neurosurgery who is in agreement with current workup and plan for lumbar MRI with and without contrast. [AH]  1922 Case discussed with Dr.  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 [AH]    Clinical Course User Index [AH] Arloa Chroman, PA-C                                 Medical Decision Making Amount and/or Complexity of Data Reviewed Labs: ordered. Radiology: ordered.  Risk Prescription drug management. Decision regarding hospitalization.   This patient presents to the ED for concern of fever and severe back pain, this involves an extensive number of treatment options, and is a complaint that carries with it a high risk of complications and morbidity.  Differential diagnosis includes any number of infectious agents including viral infection (flu RSV COVID) urinary tract infection, most likely diagnosis after recent procedures includes spinal epidural abscess, infected spinal hematoma. I do not have high suspicion for meningitis or encephalitis as patient has no headache, no vision changes, no neck stiffness, no light sensitivity nausea or vomiting  Co morbidities:   has a past medical history of Anemia, Anxiety, Depression, GERD (gastroesophageal reflux disease), Headache, Hypertension, Meningioma (HCC), and PONV (postoperative nausea and vomiting).   Social Determinants of Health:   SDOH Screenings   Food Insecurity: Patient Declined (03/23/2024)  Housing: Patient Declined (03/23/2024)  Transportation Needs: No Transportation Needs (03/23/2024)  Utilities: Not At Risk (03/23/2024)  Tobacco Use: Low Risk  (04/01/2024)     Additional history:  {Additional history obtained from family at bedside {External records from outside source obtained and reviewed including previous admission records/ op notes  Lab Tests:  I Ordered, and personally interpreted labs.  The pertinent results include:   White count within normal limits, hemoglobin 8.4 at baseline.  Elevated platelets may be acute phase reaction. Lactic acid within normal limits Urine  shows no evidence of infection PT/INR also within normal limits does not suggest multiorgan failure in the setting of sepsis Blood cultures  are pending Respiratory panel negative for COVID flu or RSV\  Imaging Studies:  I ordered imaging studies including MRI lumbar spine with and without contrast, portable 1 view chest x-ray I independently visualized and interpreted imaging which showed portable 1 view chest shows no acute findings.  MRI lumbar spine shows ependymoma at S2 and dural enhancement suggestive of infection. I agree with the radiologist interpretation  Cardiac Monitoring/ECG:  The patient was maintained on a cardiac monitor.  I personally viewed and interpreted the cardiac monitored which showed an underlying rhythm of: EKG shows sinus cardia at a rate of 104  Medicines ordered and prescription drug management:  I ordered medication including  Medications  lactated ringers  infusion (0 mLs Intravenous Stopped 04/01/24 1851)  ceFEPIme  (MAXIPIME ) 2 g in sodium chloride  0.9 % 100 mL IVPB (has no administration in time range)  lactated ringers  bolus 1,000 mL (has no administration in time range)  lidocaine  (XYLOCAINE ) 2 % (with pres) injection (has no administration in time range)  vancomycin  (VANCOREADY) IVPB 2000 mg/400 mL (has no administration in time range)  morphine  (PF) 4 MG/ML injection 4 mg (4 mg Intravenous Given 04/01/24 1618)  ondansetron  (ZOFRAN ) injection 4 mg (4 mg Intravenous Given 04/01/24 1618)  ibuprofen  (ADVIL ) tablet 800 mg (800 mg Oral Given 04/01/24 1709)  gadobutrol  (GADAVIST ) 1 MMOL/ML injection 10 mL (10 mLs Intravenous Contrast Given 04/01/24 1824)  morphine  (PF) 4 MG/ML injection 4 mg (4 mg Intravenous Given 04/01/24 2126)  iohexol  (OMNIPAQUE ) 350 MG/ML injection 75 mL (75 mLs Intravenous Contrast Given 04/01/24 2324)   for severe back pain Reevaluation of the patient after these medicines showed that the patient improved I have reviewed the patients home  medicines and have made adjustments as needed  Test Considered:    Critical Interventions:    Consultations Obtained: Consultations as per ED course lastly Case discussed with Dr. Norleen Muscat for admission  Problem List / ED Course:     ICD-10-CM   1. Post-procedural fever  R50.82     2. Ependymoma (HCC)  C71.9       MDM: 53 year old female with complicated medical history recent procedures who presents with fever severe back pain and right foot paresthesia which been persistent since a drain was placed.  She has no headache visual changes light sensitivity nausea or vomiting suggestive of a encephalitis or meningitis.  After review of all data points I do not find a specific source.  Concern for potential spinal infection.  I attempted a lumbar patch puncture and patient was extremely tolerant with multiple attempts at bedside without successful CSF retrieval.  Patient will need probably a fluoroscopic guided procedure as body habitus along with known scoliosis likely prevented successful lumbar puncture.    Dispostion:  After consideration of the diagnostic results and the patients response to treatment, I feel that the patent would benefit from admission.      Final diagnoses:  Post-procedural fever  Ependymoma [facility])    ED Discharge Orders     None          Arloa Chroman, PA-C 04/01/24 2335    Bari Roxie HERO, DO 04/01/24 2353

## 2024-04-01 NOTE — ED Triage Notes (Signed)
 Pt arrived via [facility] from home. Pt reports new onset severe/sharp/stabbing back pain, fever, and swelling/neuropathy in both feet. Pt reports that these symptoms started 2 days ago. Pt has a hx of a brain tumor removal in July and had a tube placed to drain fluid from her brain approximately 2-3 weeks ago. Pt stayed in the hospital for a week and was discharged a week ago. Pt stated that she has a follow up appointment with her surgeon tomorrow but was in so much pain that she decided to come to the ED. EMS BP 158/100 (pt reports that she hasn't taken her BP meds today).

## 2024-04-01 NOTE — ED Notes (Signed)
Abby, PA at bedside.  

## 2024-04-01 NOTE — Progress Notes (Signed)
 53 y/o F w/ hx suboccipital crani for meningioma resection (2017), redo resection (02/28/2024), subsequent LD placement for CSF leak (two weeks ago), VPS placement (1 week ago) who presented with ongoing LBP since the lumbar drain placement plus fever. MRI Lspine shows ill defined dural enhancement within the Lspine concerning for possible meningitis. Also shows incidental S2 mass. WBC normal. Tmax 101.9. Cx pending  ED has decided to perform an LP to r/o meningitis [employer] abx ordered Agree with further imaging Nsgy to follow as consultant

## 2024-04-01 NOTE — ED Notes (Signed)
 Patient transported to CT

## 2024-04-01 NOTE — ED Notes (Signed)
 Patient transported to MRI

## 2024-04-01 NOTE — Progress Notes (Incomplete)
 Pharmacy Antibiotic Note  Alexa [name] is a 53 y.o. female for which pharmacy has been consulted for vancomycin  dosing for sepsis.  Patient with a history of recent meningioma resection w/ subsequent readmission with CSF leak from craniotomy site now is s/p lumbar drain on 8/18 and placement of a VP shunt on 8/24.   Patient presenting with severe low back pain. Imaging concerning for likely developing epidural abscess / spinal meningitis.  SCr 0.77 WBC 8.9; LA 0.8; T 99.5; HR 105>96; RR 17 COVID neg / flu neg  Plan: Vancomycin  2000 mg x 1 then 1250 mg q12h (eAUC 502.6) --Could consider nomogram dosing for CSF/CNS indication pending LP findings Metronidazole per MD Cefepime  2g q8hr  Monitor WBC, fever, renal function, cultures De-escalate when able Levels at steady state  Height: 5' 8 (172.7 cm) Weight: 95.3 kg (210 lb) IBW/kg (Calculated) : 63.9  Temp (24hrs), Avg:100.9 F (38.3 C), Min:99.5 F (37.5 C), Max:101.9 F (38.8 C)  Recent Labs  Lab 04/01/24 1544 04/01/24 1610  WBC 8.9  --   CREATININE 0.77  --   LATICACIDVEN  --  0.8    Estimated Creatinine Clearance: 98.2 mL/min (by C-G formula based on SCr of 0.77 mg/dL).    Allergies  Allergen Reactions   Watermelon [Citrullus Vulgaris] Nausea And Vomiting   Microbiology results: Pending  Thank you for allowing pharmacy to be a part of this patient's care.  Dorn Buttner, PharmD, BCPS 04/01/2024 9:55 PM ED Clinical Pharmacist -  [phone]

## 2024-04-02 ENCOUNTER — Other Ambulatory Visit (HOSPITAL_BASED_OUTPATIENT_CLINIC_OR_DEPARTMENT_OTHER): Payer: Self-pay

## 2024-04-02 ENCOUNTER — Inpatient Hospital Stay (HOSPITAL_COMMUNITY)

## 2024-04-02 DIAGNOSIS — D329 Benign neoplasm of meninges, unspecified: Secondary | ICD-10-CM | POA: Diagnosis not present

## 2024-04-02 DIAGNOSIS — Z1152 Encounter for screening for COVID-19: Secondary | ICD-10-CM | POA: Diagnosis not present

## 2024-04-02 DIAGNOSIS — M7989 Other specified soft tissue disorders: Secondary | ICD-10-CM | POA: Diagnosis not present

## 2024-04-02 DIAGNOSIS — T8144XA Sepsis following a procedure, initial encounter: Secondary | ICD-10-CM | POA: Diagnosis not present

## 2024-04-02 DIAGNOSIS — F419 Anxiety disorder, unspecified: Secondary | ICD-10-CM | POA: Diagnosis present

## 2024-04-02 DIAGNOSIS — Z6834 Body mass index (BMI) 34.0-34.9, adult: Secondary | ICD-10-CM | POA: Diagnosis not present

## 2024-04-02 DIAGNOSIS — R519 Headache, unspecified: Secondary | ICD-10-CM | POA: Diagnosis not present

## 2024-04-02 DIAGNOSIS — F39 Unspecified mood [affective] disorder: Secondary | ICD-10-CM | POA: Diagnosis not present

## 2024-04-02 DIAGNOSIS — Y838 Other surgical procedures as the cause of abnormal reaction of the patient, or of later complication, without mention of misadventure at the time of the procedure: Secondary | ICD-10-CM | POA: Diagnosis present

## 2024-04-02 DIAGNOSIS — R197 Diarrhea, unspecified: Secondary | ICD-10-CM | POA: Diagnosis not present

## 2024-04-02 DIAGNOSIS — R4182 Altered mental status, unspecified: Secondary | ICD-10-CM | POA: Diagnosis not present

## 2024-04-02 DIAGNOSIS — E876 Hypokalemia: Secondary | ICD-10-CM | POA: Diagnosis not present

## 2024-04-02 DIAGNOSIS — Z7982 Long term (current) use of aspirin: Secondary | ICD-10-CM | POA: Diagnosis not present

## 2024-04-02 DIAGNOSIS — G9611 Dural tear: Secondary | ICD-10-CM | POA: Diagnosis not present

## 2024-04-02 DIAGNOSIS — D62 Acute posthemorrhagic anemia: Secondary | ICD-10-CM | POA: Diagnosis not present

## 2024-04-02 DIAGNOSIS — Z8249 Family history of ischemic heart disease and other diseases of the circulatory system: Secondary | ICD-10-CM | POA: Diagnosis not present

## 2024-04-02 DIAGNOSIS — Z8041 Family history of malignant neoplasm of ovary: Secondary | ICD-10-CM | POA: Diagnosis not present

## 2024-04-02 DIAGNOSIS — Y95 Nosocomial condition: Secondary | ICD-10-CM | POA: Diagnosis present

## 2024-04-02 DIAGNOSIS — D709 Neutropenia, unspecified: Secondary | ICD-10-CM | POA: Diagnosis not present

## 2024-04-02 DIAGNOSIS — R509 Fever, unspecified: Secondary | ICD-10-CM

## 2024-04-02 DIAGNOSIS — A419 Sepsis, unspecified organism: Secondary | ICD-10-CM | POA: Insufficient documentation

## 2024-04-02 DIAGNOSIS — E66811 Obesity, class 1: Secondary | ICD-10-CM | POA: Diagnosis not present

## 2024-04-02 DIAGNOSIS — R5082 Postprocedural fever: Secondary | ICD-10-CM | POA: Diagnosis not present

## 2024-04-02 DIAGNOSIS — F32A Depression, unspecified: Secondary | ICD-10-CM | POA: Diagnosis not present

## 2024-04-02 DIAGNOSIS — Z833 Family history of diabetes mellitus: Secondary | ICD-10-CM | POA: Diagnosis not present

## 2024-04-02 DIAGNOSIS — M5417 Radiculopathy, lumbosacral region: Secondary | ICD-10-CM | POA: Diagnosis not present

## 2024-04-02 DIAGNOSIS — Z841 Family history of disorders of kidney and ureter: Secondary | ICD-10-CM | POA: Diagnosis not present

## 2024-04-02 DIAGNOSIS — I10 Essential (primary) hypertension: Secondary | ICD-10-CM | POA: Diagnosis not present

## 2024-04-02 DIAGNOSIS — Z982 Presence of cerebrospinal fluid drainage device: Secondary | ICD-10-CM | POA: Diagnosis not present

## 2024-04-02 DIAGNOSIS — M160 Bilateral primary osteoarthritis of hip: Secondary | ICD-10-CM | POA: Diagnosis not present

## 2024-04-02 DIAGNOSIS — G629 Polyneuropathy, unspecified: Secondary | ICD-10-CM | POA: Diagnosis present

## 2024-04-02 DIAGNOSIS — G039 Meningitis, unspecified: Secondary | ICD-10-CM | POA: Diagnosis not present

## 2024-04-02 DIAGNOSIS — G009 Bacterial meningitis, unspecified: Secondary | ICD-10-CM | POA: Diagnosis not present

## 2024-04-02 DIAGNOSIS — C719 Malignant neoplasm of brain, unspecified: Secondary | ICD-10-CM | POA: Diagnosis not present

## 2024-04-02 DIAGNOSIS — N3281 Overactive bladder: Secondary | ICD-10-CM | POA: Diagnosis not present

## 2024-04-02 DIAGNOSIS — Z9109 Other allergy status, other than to drugs and biological substances: Secondary | ICD-10-CM | POA: Diagnosis not present

## 2024-04-02 DIAGNOSIS — Z808 Family history of malignant neoplasm of other organs or systems: Secondary | ICD-10-CM | POA: Diagnosis not present

## 2024-04-02 DIAGNOSIS — G9600 Cerebrospinal fluid leak, unspecified: Secondary | ICD-10-CM | POA: Diagnosis not present

## 2024-04-02 DIAGNOSIS — M5416 Radiculopathy, lumbar region: Secondary | ICD-10-CM | POA: Diagnosis present

## 2024-04-02 DIAGNOSIS — R5381 Other malaise: Secondary | ICD-10-CM | POA: Diagnosis not present

## 2024-04-02 DIAGNOSIS — R000 Tachycardia, unspecified: Secondary | ICD-10-CM | POA: Diagnosis not present

## 2024-04-02 DIAGNOSIS — Z79899 Other long term (current) drug therapy: Secondary | ICD-10-CM | POA: Diagnosis not present

## 2024-04-02 LAB — CSF CELL COUNT WITH DIFFERENTIAL
Eosinophils, CSF: 0 % (ref 0–1)
Lymphs, CSF: 5 % — ABNORMAL LOW (ref 40–80)
Monocyte-Macrophage-Spinal Fluid: 1 % — ABNORMAL LOW (ref 15–45)
RBC Count, CSF: 540 /mm3 — ABNORMAL HIGH
Segmented Neutrophils-CSF: 94 % — ABNORMAL HIGH (ref 0–6)
Tube #: 4
WBC, CSF: 1040 /mm3 (ref 0–5)

## 2024-04-02 LAB — CBC
HCT: 25.9 % — ABNORMAL LOW (ref 36.0–46.0)
Hemoglobin: 8.2 g/dL — ABNORMAL LOW (ref 12.0–15.0)
MCH: 28.1 pg (ref 26.0–34.0)
MCHC: 31.7 g/dL (ref 30.0–36.0)
MCV: 88.7 fL (ref 80.0–100.0)
Platelets: 473 K/uL — ABNORMAL HIGH (ref 150–400)
RBC: 2.92 MIL/uL — ABNORMAL LOW (ref 3.87–5.11)
RDW: 12.8 % (ref 11.5–15.5)
WBC: 5 K/uL (ref 4.0–10.5)
nRBC: 0 % (ref 0.0–0.2)

## 2024-04-02 LAB — MAGNESIUM: Magnesium: 2 mg/dL (ref 1.7–2.4)

## 2024-04-02 LAB — BASIC METABOLIC PANEL WITH GFR
Anion gap: 13 (ref 5–15)
BUN: <5 mg/dL — ABNORMAL LOW (ref 6–20)
CO2: 25 mmol/L (ref 22–32)
Calcium: 9.2 mg/dL (ref 8.9–10.3)
Chloride: 101 mmol/L (ref 98–111)
Creatinine, Ser: 0.75 mg/dL (ref 0.44–1.00)
GFR, Estimated: >60 mL/min (ref >60)
Glucose, Bld: 109 mg/dL — ABNORMAL HIGH (ref 70–99)
Potassium: 4.1 mmol/L (ref 3.5–5.1)
Sodium: 139 mmol/L (ref 135–145)

## 2024-04-02 LAB — PROTIME-INR
INR: 1 (ref 0.8–1.2)
Prothrombin Time: 13.9 s (ref 11.4–15.2)

## 2024-04-02 LAB — PHOSPHORUS: Phosphorus: 3.7 mg/dL (ref 2.5–4.6)

## 2024-04-02 MED ORDER — LACTATED RINGERS IV BOLUS: 500.0000 mL | Freq: Once | INTRAVENOUS | Status: DC

## 2024-04-02 MED ORDER — GABAPENTIN 300 MG PO CAPS
300.0000 mg | ORAL_CAPSULE | Freq: Two times a day (BID) | ORAL | Status: DC
  Administered 2024-04-02 – 2024-04-04 (×5): 300 mg via ORAL
  Filled 2024-04-02 (×5): qty 1

## 2024-04-02 MED ORDER — METOPROLOL TARTRATE 5 MG/5ML IV SOLN: 5.0000 mg | INTRAVENOUS | Status: DC | PRN

## 2024-04-02 MED ORDER — IPRATROPIUM-ALBUTEROL 0.5-2.5 (3) MG/3ML IN SOLN: 3.0000 mL | RESPIRATORY_TRACT | Status: DC | PRN

## 2024-04-02 MED ORDER — HYDROMORPHONE HCL 1 MG/ML IJ SOLN
0.5000 mg | INTRAMUSCULAR | Status: DC | PRN
  Administered 2024-04-02 – 2024-04-05 (×6): 0.5 mg via INTRAVENOUS
  Filled 2024-04-02 (×6): qty 0.5

## 2024-04-02 MED ORDER — CHLORHEXIDINE GLUCONATE CLOTH 2 % EX PADS
6.0000 | MEDICATED_PAD | Freq: Every day | CUTANEOUS | Status: DC
  Administered 2024-04-02 – 2024-04-08 (×7): 6 via TOPICAL

## 2024-04-02 MED ORDER — POLYETHYLENE GLYCOL 3350 17 G PO PACK
17.0000 g | PACK | Freq: Two times a day (BID) | ORAL | Status: DC
  Administered 2024-04-02 – 2024-04-05 (×6): 17 g via ORAL
  Filled 2024-04-02 (×8): qty 1

## 2024-04-02 MED ORDER — SENNOSIDES-DOCUSATE SODIUM 8.6-50 MG PO TABS
1.0000 | ORAL_TABLET | Freq: Every evening | ORAL | Status: DC | PRN
  Administered 2024-04-03: 1 via ORAL
  Filled 2024-04-02: qty 1

## 2024-04-02 MED ORDER — METHOCARBAMOL 500 MG PO TABS
500.0000 mg | ORAL_TABLET | Freq: Three times a day (TID) | ORAL | Status: DC | PRN
  Administered 2024-04-03 – 2024-04-05 (×2): 500 mg via ORAL
  Filled 2024-04-02 (×2): qty 1

## 2024-04-02 MED ORDER — ACETAMINOPHEN 500 MG PO TABS
1000.0000 mg | ORAL_TABLET | Freq: Four times a day (QID) | ORAL | Status: DC | PRN
  Administered 2024-04-02 (×2): 1000 mg via ORAL
  Filled 2024-04-02 (×2): qty 2

## 2024-04-02 MED ORDER — GLUCAGON HCL RDNA (DIAGNOSTIC) 1 MG IJ SOLR: 1.0000 mg | INTRAMUSCULAR | Status: DC | PRN

## 2024-04-02 MED ORDER — OXYCODONE HCL 5 MG PO TABS
5.0000 mg | ORAL_TABLET | ORAL | Status: DC | PRN
  Administered 2024-04-02 – 2024-04-03 (×5): 5 mg via ORAL
  Administered 2024-04-04: 10 mg via ORAL
  Administered 2024-04-04 – 2024-04-05 (×2): 5 mg via ORAL
  Administered 2024-04-05: 10 mg via ORAL
  Filled 2024-04-02 (×3): qty 1
  Filled 2024-04-02: qty 2
  Filled 2024-04-02 (×2): qty 1
  Filled 2024-04-02: qty 2
  Filled 2024-04-02 (×2): qty 1

## 2024-04-02 MED ORDER — VANCOMYCIN HCL 1000 MG IV SOLR
1000.0000 mg | Freq: Three times a day (TID) | INTRAVENOUS | Status: DC
  Administered 2024-04-02 – 2024-04-08 (×20): 1000 mg via INTRAVENOUS
  Filled 2024-04-02 (×25): qty 20

## 2024-04-02 MED ORDER — ATENOLOL 25 MG PO TABS
50.0000 mg | ORAL_TABLET | Freq: Every day | ORAL | Status: DC
  Administered 2024-04-02 – 2024-04-08 (×7): 50 mg via ORAL
  Filled 2024-04-02 (×7): qty 2

## 2024-04-02 MED ORDER — ORAL CARE MOUTH RINSE
15.0000 mL | OROMUCOSAL | Status: DC | PRN
Start: 2024-04-02 — End: 2024-04-08

## 2024-04-02 MED ORDER — SODIUM CHLORIDE 0.9% FLUSH
3.0000 mL | Freq: Two times a day (BID) | INTRAVENOUS | Status: DC
  Administered 2024-04-02 – 2024-04-08 (×6): 3 mL via INTRAVENOUS

## 2024-04-02 MED ORDER — POLYETHYLENE GLYCOL 3350 17 G PO PACK: 17.0000 g | PACK | Freq: Every day | ORAL | Status: DC | PRN

## 2024-04-02 MED ORDER — LACTATED RINGERS IV SOLN: INTRAVENOUS | Status: DC

## 2024-04-02 MED ORDER — ALBUTEROL SULFATE (2.5 MG/3ML) 0.083% IN NEBU: 2.5000 mg | INHALATION_SOLUTION | RESPIRATORY_TRACT | Status: DC | PRN

## 2024-04-02 MED ORDER — ONDANSETRON HCL 4 MG/2ML IJ SOLN: 4.0000 mg | Freq: Four times a day (QID) | INTRAMUSCULAR | Status: DC | PRN

## 2024-04-02 MED ORDER — MELATONIN 3 MG PO TABS: 6.0000 mg | ORAL_TABLET | Freq: Every evening | ORAL | Status: DC | PRN

## 2024-04-02 MED ORDER — HYDRALAZINE HCL 20 MG/ML IJ SOLN: 10.0000 mg | INTRAMUSCULAR | Status: DC | PRN

## 2024-04-02 MED ORDER — OXYCODONE HCL 5 MG PO TABS: 2.5000 mg | ORAL_TABLET | ORAL | Status: DC | PRN

## 2024-04-02 MED ORDER — GUAIFENESIN 100 MG/5ML PO LIQD: 5.0000 mL | ORAL | Status: DC | PRN

## 2024-04-02 MED ORDER — OXYCODONE HCL 5 MG PO TABS: 5.0000 mg | ORAL_TABLET | ORAL | Status: DC | PRN

## 2024-04-02 MED ORDER — DOCUSATE SODIUM 100 MG PO CAPS
100.0000 mg | ORAL_CAPSULE | Freq: Two times a day (BID) | ORAL | Status: DC
  Administered 2024-04-02 – 2024-04-05 (×7): 100 mg via ORAL
  Filled 2024-04-02 (×9): qty 1

## 2024-04-02 NOTE — Progress Notes (Signed)
 Staples removed from left lower abdomen surgical incision per Dr. Gillie verbal order, pt tolerated well, site is clean and dry.

## 2024-04-02 NOTE — Procedures (Addendum)
 PROCEDURE SUMMARY:  Successful fluoroscopic guided lumbar puncture, with return of rust colored CSF. As requested, at fifth vial was obtained for freezing and possible future testing. No immediate complications.  Pt tolerated well.   EBL = none  Please see full dictation in imaging section of Epic for procedure details.

## 2024-04-02 NOTE — Progress Notes (Signed)
 Right lower extremity  has been completed. Refer to [facility] under chart review to view preliminary results.   04/02/2024  10:16 AM Chanika Byland, Ricka BIRCH

## 2024-04-02 NOTE — Progress Notes (Signed)
 Date and time results received: 04/02/24 2115   Test: CSF WBC Critical Value: 1040  Name of Provider Notified: [place]  Orders Received? Or Actions Taken?: Notification made

## 2024-04-02 NOTE — Hospital Course (Addendum)
 Brief Narrative:   53 year old with history of HTN, mood disorder, recent meningioma resection 7/29 - 7/30 with Dr. Gillie readmitted on 8/17 with CSF leak from craniotomy site underwent lumbar drain placement on 8/18.  Eventually underwent VP shunt placement on 8/24 with laparoscopic access with Dr. Stevie.  Now presents with lower back pain along with fevers concerning for spinal meningitis.  MRI shows dural enhancement of the lumbar region associated with S2 mass.  Neurosurgery and infectious disease has been consulted.  Assessment & Plan:  Principal Problem:   Meningitis spinal Active Problems:   Sepsis [facility])     Healthcare associated spinal meningitis, in setting of recent neurosurgical procedures  HX recent Meningioma resection 7/29, redo 7/30  Hx recent CSF leak from craniectomy, Lumbar drain 8/18, + VP shunt with laparoscopic placement on 8/24  -MRI lumbar spine consistent with dural enhancement/infection.  CT head and CT abdomen pelvis unremarkable.  Seen by neurosurgery. - Unsuccessful LP in the ER therefore IR consulted by the admitting provider and lab work has been ordered. - Currently on empiric antibiotics.  Consulted ID   Lumbosacral radiculopathy, R foot paresthesia  Some lumbar radiculopathy especially towards right lower extremity.  Neurosurgery team is following.  At the moment there is no significant red flags/neurologic compromise.   ? 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:  -- NSGY may manage and followup on this as well     Asymmetric RLE edema, mild  -Right lower extremity venous duplex ordered  HTN  Continue home Atenolol . Hold home hydrochlorothiazide  I/s/o sepsis.   Mood d/o Has not been taking bupropion  or Lexapro    Body mass index is 31.93 kg/m. Obesity class I would benefit from weight loss outpatient  DVT prophylaxis: SCDs Start: 04/02/24 0026 only on the left for now    Code Status: Full Code Family Communication:    Status is: Inpatient Remains inpatient appropriate because: Continue hospital stay for evaluation of meningitis   PT Follow up Recs:   Subjective:  Seen at bedside reporting of back pain and headache.  Examination:  General exam: Appears calm and comfortable  Respiratory system: Clear to auscultation. Respiratory effort normal. Cardiovascular system: S1 & S2 heard, RRR. No JVD, murmurs, rubs, gallops or clicks. No pedal edema. Gastrointestinal system: Abdomen is nondistended, soft and nontender. No organomegaly or masses felt. Normal bowel sounds heard. Central nervous system: Alert and oriented. No focal neurological deficits. Extremities: Symmetric 5 x 5 power. Skin: No rashes, lesions or ulcers Psychiatry: Judgement and insight appear normal. Mood & affect appropriate.

## 2024-04-02 NOTE — Progress Notes (Signed)
 Pt is back to the unit.

## 2024-04-02 NOTE — Plan of Care (Signed)
  Problem: Education: Goal: Knowledge of General Education information will improve Description: Including pain rating scale, medication(s)/side effects and non-pharmacologic comfort measures Outcome: Progressing   Problem: Clinical Measurements: Goal: Respiratory complications will improve Outcome: Progressing Goal: Cardiovascular complication will be avoided Outcome: Progressing   Problem: Coping: Goal: Level of anxiety will decrease Outcome: Progressing   Problem: Elimination: Goal: Will not experience complications related to urinary retention Outcome: Progressing   Problem: Skin Integrity: Goal: Risk for impaired skin integrity will decrease Outcome: Progressing

## 2024-04-02 NOTE — Progress Notes (Signed)
 Pt is off the unit to IR for LP

## 2024-04-02 NOTE — TOC Initial Note (Signed)
 Transition of Care [facility]) - Initial/Assessment Note    Patient Details  Name: Alexa [name] MRN: [id number] Date of Birth: 02-25-1971  [facility] of [facility]) CM/SW Contact:    Andrez JULIANNA George, RN Phone Number: 04/02/2024, 1:57 PM  Clinical Narrative:                  Pt is from home with her mom and sister. They provide needed supervision and support. Sister and mom work but have been rotating to provide what she needs.  Pt states she has ordered a wheelchair for home.  She is active with [facility] for [facility] therapies. She would like to continue with them if that's the recommendations from PT at d/c.  Mom provides needed transportation. Mom is having trouble with pts weakness providing support in and out of the appointments.  Pt manages her own medications. Pt is without a PCP. She asked that IP Care management assist with this. CM will obtain an appt prior to discharge. IP Care management following.    Barriers to Discharge: Continued Medical Work up   Patient Goals and CMS Choice   CMS Medicare.gov Compare Post Acute Care list provided to:: Patient Choice offered to / list presented to : Patient      Expected Discharge Plan and Services   Discharge Planning Services: CM Consult Post Acute Care Choice: Home Health Living arrangements for the past 2 months: Single Family Home                                      Prior Living Arrangements/Services Living arrangements for the past 2 months: Single Family Home Lives with:: Siblings, Parents Patient language and need for interpreter reviewed:: Yes Do you feel safe going back to the place where you live?: Yes        Care giver support system in place?: Yes (comment) Current home services: DME (walker) Criminal Activity/Legal Involvement Pertinent to Current Situation/Hospitalization: No - Comment as needed  Activities of Daily Living      Permission Sought/Granted                  Emotional  Assessment Appearance:: Appears stated age Attitude/Demeanor/Rapport: Engaged Affect (typically observed): Accepting Orientation: : Oriented to Self, Oriented to Place, Oriented to  Time, Oriented to Situation   Psych Involvement: No (comment)  Admission diagnosis:  Meningitis spinal [G03.9] 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] Post-procedural fever [R50.82] Patient Active Problem List   Diagnosis Date Noted   Meningitis spinal 04/02/2024   Sepsis (HCC) 04/02/2024   Postoperative CSF leak 03/24/2024   CSF leak 03/17/2024   Status post craniotomy 02/21/2024   Meningioma, recurrent of brain (HCC) 02/21/2024   S/P craniotomy 02/16/2024   Social anxiety disorder 12/25/2016   Severe episode of recurrent major depressive disorder, without psychotic features (HCC) 12/25/2016   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   PCP:  Patient, No Pcp Per Pharmacy:   [facility] [address], [address] [place] [address] Phone: [phone] Fax: [phone]     Social Drivers of Health (SDOH) Social History: SDOH Screenings   Food Insecurity: Patient Declined (03/23/2024)  Housing: Patient Declined (03/23/2024)  Transportation Needs: No Transportation Needs (03/23/2024)  Utilities: Not At Risk (03/23/2024)  Tobacco Use: Low Risk  (04/01/2024)   SDOH Interventions:     Readmission Risk Interventions     No data to display

## 2024-04-02 NOTE — Progress Notes (Signed)
 PROGRESS NOTE    Alexa [name]  FMW:[id number] DOB: 1971/06/30 DOA: 04/01/2024 PCP: Patient, No Pcp Per    Brief Narrative:   53 year old with history of HTN, mood disorder, recent meningioma resection 7/29 - 7/30 with Dr. Gillie readmitted on 8/17 with CSF leak from craniotomy site underwent lumbar drain placement on 8/18.  Eventually underwent VP shunt placement on 8/24 with laparoscopic access with Dr. Stevie.  Now presents with lower back pain along with fevers concerning for spinal meningitis.  MRI shows dural enhancement of the lumbar region associated with S2 mass.  Neurosurgery and infectious disease has been consulted.  Assessment & Plan:  Principal Problem:   Meningitis spinal Active Problems:   Sepsis [facility])     Healthcare associated spinal meningitis, in setting of recent neurosurgical procedures  HX recent Meningioma resection 7/29, redo 7/30  Hx recent CSF leak from craniectomy, Lumbar drain 8/18, + VP shunt with laparoscopic placement on 8/24  -MRI lumbar spine consistent with dural enhancement/infection.  CT head and CT abdomen pelvis unremarkable.  Seen by neurosurgery. - Unsuccessful LP in the ER therefore IR consulted by the admitting provider and lab work has been ordered. - Currently on empiric antibiotics.  Consulted ID   Lumbosacral radiculopathy, R foot paresthesia  Some lumbar radiculopathy especially towards right lower extremity.  Neurosurgery team is following.  At the moment there is no significant red flags/neurologic compromise.   ? 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:  -- NSGY may manage and followup on this as well     Asymmetric RLE edema, mild  -Right lower extremity venous duplex ordered  HTN  Continue home Atenolol . Hold home hydrochlorothiazide  I/s/o sepsis.   Mood d/o Has not been taking bupropion  or Lexapro    Body mass index is 31.93 kg/m. Obesity class I would benefit from weight loss  outpatient  DVT prophylaxis: SCDs Start: 04/02/24 0026 only on the left for now    Code Status: Full Code Family Communication:   Status is: Inpatient Remains inpatient appropriate because: Continue hospital stay for evaluation of meningitis   PT Follow up Recs:   Subjective:  Seen at bedside reporting of back pain and headache.  Examination:  General exam: Appears calm and comfortable  Respiratory system: Clear to auscultation. Respiratory effort normal. Cardiovascular system: S1 & S2 heard, RRR. No JVD, murmurs, rubs, gallops or clicks. No pedal edema. Gastrointestinal system: Abdomen is nondistended, soft and nontender. No organomegaly or masses felt. Normal bowel sounds heard. Central nervous system: Alert and oriented. No focal neurological deficits. Extremities: Symmetric 5 x 5 power. Skin: No rashes, lesions or ulcers Psychiatry: Judgement and insight appear normal. Mood & affect appropriate.                Diet Orders (From admission, onward)     Start     Ordered   04/02/24 0005  Diet NPO time specified Except for: Sips with Meds  Diet effective now       Question:  Except for  Answer:  Sips with Meds   04/02/24 0010            Objective: Vitals:   04/02/24 0110 04/02/24 0408 04/02/24 0628 04/02/24 0745  BP: 130/70 (!) 145/95  (!) 150/90  Pulse: 91 96  (!) 104  Resp: 20 (!) 22  20  Temp: 98.2 F (36.8 C) 99.1 F (37.3 C)  99.8 F (37.7 C)  TempSrc: Oral Oral  Oral  SpO2: 97% 98%  96%  Weight:   100.7 kg   Height:        Intake/Output Summary (Last 24 hours) at 04/02/2024 1104 Last data filed at 04/02/2024 1027 Gross per 24 hour  Intake 481.84 ml  Output 4175 ml  Net -3693.16 ml   Filed Weights   04/01/24 1442 04/01/24 1445 04/02/24 0628  Weight: 95.3 kg 95.3 kg 100.7 kg    Scheduled Meds:  atenolol   50 mg Oral Daily   Chlorhexidine  Gluconate Cloth  6 each Topical Daily   docusate sodium   100 mg Oral BID   gabapentin   300 mg Oral  BID   polyethylene glycol  17 g Oral BID   sodium chloride  flush  3 mL Intravenous Q12H   Continuous Infusions:  ceFEPime  (MAXIPIME ) IV 2 g (04/02/24 0756)   lactated ringers      lactated ringers  100 mL/hr at 04/02/24 0159   vancomycin  1,000 mg (04/02/24 0941)    Nutritional status     Body mass index is 33.76 kg/m.  Data Reviewed:   CBC: Recent Labs  Lab 04/01/24 1544 04/02/24 0442  WBC 8.9 5.0  NEUTROABS 5.6  --   HGB 8.4* 8.2*  HCT 26.4* 25.9*  MCV 88.3 88.7  PLT 559* 473*   Basic Metabolic Panel: Recent Labs  Lab 04/01/24 1544 04/02/24 0442  NA 134* 139  K 3.5 4.1  CL 98 101  CO2 25 25  GLUCOSE 106* 109*  BUN <5* <5*  CREATININE 0.77 0.75  CALCIUM  9.1 9.2  MG  --  2.0  PHOS  --  3.7   GFR: Estimated Creatinine Clearance: 100.9 mL/min (by C-G formula based on SCr of 0.75 mg/dL). Liver Function Tests: Recent Labs  Lab 04/01/24 1544  AST 13*  ALT 11  ALKPHOS 47  BILITOT 0.2  PROT 7.1  ALBUMIN  3.2*   No results for input(s): LIPASE, AMYLASE in the last 168 hours. No results for input(s): AMMONIA in the last 168 hours. Coagulation Profile: Recent Labs  Lab 04/01/24 1544 04/02/24 0442  INR 1.1 1.0   Cardiac Enzymes: No results for input(s): CKTOTAL, CKMB, CKMBINDEX, TROPONINI in the last 168 hours. BNP (last 3 results) No results for input(s): PROBNP in the last 8760 hours. HbA1C: No results for input(s): HGBA1C in the last 72 hours. CBG: No results for input(s): GLUCAP in the last 168 hours. Lipid Profile: No results for input(s): CHOL, HDL, LDLCALC, TRIG, CHOLHDL, LDLDIRECT in the last 72 hours. Thyroid  Function Tests: No results for input(s): TSH, T4TOTAL, FREET4, T3FREE, THYROIDAB in the last 72 hours. Anemia Panel: No results for input(s): VITAMINB12, FOLATE, FERRITIN, TIBC, IRON, RETICCTPCT in the last 72 hours. Sepsis Labs: Recent Labs  Lab 04/01/24 1610  LATICACIDVEN 0.8     Recent Results (from the past 240 hours)  Resp panel by RT-PCR (RSV, Flu A&B, Covid) Anterior Nasal Swab     Status: None   Collection Time: 04/01/24  4:04 PM   Specimen: Anterior Nasal Swab  Result Value Ref Range Status   SARS Coronavirus 2 by RT PCR NEGATIVE NEGATIVE Final   Influenza A by PCR NEGATIVE NEGATIVE Final   Influenza B by PCR NEGATIVE NEGATIVE Final    Comment: (NOTE) The Xpert Xpress SARS-CoV-2/FLU/RSV plus assay is intended as an aid in the diagnosis of influenza from Nasopharyngeal swab specimens and should not be used as a sole basis for treatment. Nasal washings and aspirates are unacceptable for Xpert Xpress SARS-CoV-2/FLU/RSV testing.  Fact Sheet  for Patients: BloggerCourse.com  Fact Sheet for Healthcare Providers: SeriousBroker.it  This test is not yet approved or cleared by the [place]  FDA and has been authorized for detection and/or diagnosis of SARS-CoV-2 by FDA under an Emergency Use Authorization (EUA). This EUA will remain in effect (meaning this test can be used) for the duration of the COVID-19 declaration under Section 564(b)(1) of the Act, 21 U.S.C. section 360bbb-3(b)(1), unless the authorization is terminated or revoked.     Resp Syncytial Virus by PCR NEGATIVE NEGATIVE Final    Comment: (NOTE) Fact Sheet for Patients: BloggerCourse.com  Fact Sheet for Healthcare Providers: SeriousBroker.it  This test is not yet approved or cleared by the [place]  FDA and has been authorized for detection and/or diagnosis of SARS-CoV-2 by FDA under an Emergency Use Authorization (EUA). This EUA will remain in effect (meaning this test can be used) for the duration of the COVID-19 declaration under Section 564(b)(1) of the Act, 21 U.S.C. section 360bbb-3(b)(1), unless the authorization is terminated or revoked.  Performed at [facility], [address] [place], [place] [address]   Blood Culture (routine x 2)     Status: None (Preliminary result)   Collection Time: 04/01/24  4:04 PM   Specimen: BLOOD  Result Value Ref Range Status   Specimen Description BLOOD RIGHT ANTECUBITAL  Final   Special Requests   Final    BOTTLES DRAWN AEROBIC AND ANAEROBIC Blood Culture results may not be optimal due to an inadequate volume of blood received in culture bottles   Culture   Final    NO GROWTH < 24 HOURS Performed at [facility], [address], [place], [place] [address]    Report Status PENDING  Incomplete  Blood Culture (routine x 2)     Status: None (Preliminary result)   Collection Time: 04/01/24  4:04 PM   Specimen: BLOOD  Result Value Ref Range Status   Specimen Description BLOOD LEFT ANTECUBITAL  Final   Special Requests   Final    BOTTLES DRAWN AEROBIC AND ANAEROBIC Blood Culture adequate volume   Culture   Final    NO GROWTH < 24 HOURS Performed at [facility], [address], [place], [place] [address]    Report Status PENDING  Incomplete         Radiology Studies: VAS US  LOWER EXTREMITY VENOUS (DVT) Result Date: 04/02/2024  Lower Venous DVT Study Patient Name:  LEIRA REGINO  Date of Exam:   04/02/2024 Medical Rec #: [id number]         Accession #:    [id number] Date of Birth: 07-Apr-1971        Patient Gender: F Patient Age:   82 years Exam Location:  [facility] Procedure:      VAS US  LOWER EXTREMITY VENOUS (DVT) Referring Phys: DORN DAWSON --------------------------------------------------------------------------------  Indications: Swelling, and right foot numbness.  Risk Factors: Surgery S/P SHUNT INSERTION VENTRICULAR-PERITONEAL 03/25/24. Comparison Study: No priors. Performing Technologist: Ricka Sturdivant-Jones RDMS, RVT  Examination Guidelines: A complete evaluation includes B-mode imaging, spectral Doppler, color Doppler, and power Doppler as needed of all accessible  portions of each vessel. Bilateral testing is considered an integral part of a complete examination. Limited examinations for reoccurring indications may be performed as noted. The reflux portion of the exam is performed with the patient in reverse Trendelenburg.  +---------+---------------+---------+-----------+----------+--------------+ RIGHT    CompressibilityPhasicitySpontaneityPropertiesThrombus Aging +---------+---------------+---------+-----------+----------+--------------+ CFV      Full  Yes      Yes                                 +---------+---------------+---------+-----------+----------+--------------+ SFJ      Full                                                        +---------+---------------+---------+-----------+----------+--------------+ FV Prox  Full                                                        +---------+---------------+---------+-----------+----------+--------------+ FV Mid   Full           Yes      Yes                                 +---------+---------------+---------+-----------+----------+--------------+ FV DistalFull                                                        +---------+---------------+---------+-----------+----------+--------------+ PFV      Full                                                        +---------+---------------+---------+-----------+----------+--------------+ POP      Full           Yes      Yes                                 +---------+---------------+---------+-----------+----------+--------------+ PTV      Full                                                        +---------+---------------+---------+-----------+----------+--------------+ PERO     Full                                                        +---------+---------------+---------+-----------+----------+--------------+   +----+---------------+---------+-----------+----------+--------------+  LEFTCompressibilityPhasicitySpontaneityPropertiesThrombus Aging +----+---------------+---------+-----------+----------+--------------+ CFV Full           Yes      Yes                                 +----+---------------+---------+-----------+----------+--------------+ SFJ Full                                                        +----+---------------+---------+-----------+----------+--------------+  Summary: RIGHT: - There is no evidence of deep vein thrombosis in the lower extremity.  - No cystic structure found in the popliteal fossa.  LEFT: - No evidence of common femoral vein obstruction.   *See table(s) above for measurements and observations.    Preliminary    CT ABDOMEN PELVIS W CONTRAST Result Date: 04/01/2024 CLINICAL DATA:  History of recent ventricular peritoneal shunt with back pain, initial encounter EXAM: CT ABDOMEN AND PELVIS WITH CONTRAST TECHNIQUE: Multidetector CT imaging of the abdomen and pelvis was performed using the standard protocol following bolus administration of intravenous contrast. RADIATION DOSE REDUCTION: This exam was performed according to the departmental dose-optimization program which includes automated exposure control, adjustment of the mA and/or kV according to patient size and/or use of iterative reconstruction technique. CONTRAST:  75mL OMNIPAQUE  IOHEXOL  350 MG/ML SOLN COMPARISON:  None Available. FINDINGS: Lower chest: Mild left basilar atelectasis is noted. Hepatobiliary: No focal liver abnormality is seen. No gallstones, gallbladder wall thickening, or biliary dilatation. Pancreas: Unremarkable. No pancreatic ductal dilatation or surrounding inflammatory changes. Spleen: Normal in size without focal abnormality. Adrenals/Urinary Tract: Adrenal glands are within normal limits. Kidneys are well visualized bilaterally. Single cyst is noted in the lower pole the left kidney. No follow-up is recommended. Bladder is partially distended. Foley  catheter is noted in place. Stomach/Bowel: No obstructive or inflammatory changes of the colon are noted. Scattered fecal material is noted throughout the colon. Appendix is not well visualized. Inflammatory changes to suggest appendicitis are seen. Small bowel and stomach appear within normal limits. Vascular/Lymphatic: No significant vascular findings are present. No enlarged abdominal or pelvic lymph nodes. Reproductive: Uterus and bilateral adnexa are unremarkable. Other: Ventricular peritoneal shunt is identified extending into the abdominal cavity. The catheter tip lies in the anterior right lower quadrant. No significant free fluid is noted. Musculoskeletal: The known enhancing mass posterior to the S2 vertebral body is not well appreciated on this exam. Postprocedural changes are noted along the left paraspinal lumbar tissues related to recent lumbar puncture. No acute bony abnormality is noted. IMPRESSION: Ventricular peritoneal shunt in satisfactory position. Mild left basilar atelectasis. Known mass lesion at S2 is not well appreciated on this exam Electronically Signed   By: Oneil Devonshire M.D.   On: 04/01/2024 23:53   CT HEAD W & WO CONTRAST ( ) Result Date: 04/01/2024 EXAM: CT HEAD WITH AND WITHOUT CONTRAST 04/01/2024 11:23:40 PM TECHNIQUE: CT of the head was performed with and without the administration of intravenous contrast. Automated exposure control, iterative reconstruction, and/or weight based adjustment of the mA/kV was utilized to reduce the radiation dose to as low as reasonably achievable. COMPARISON: 03/23/2024 CLINICAL HISTORY: Hx of recent neurosurgical procedures, septic, eval for infectious complication. Severe back pain worsening post procedure. FINDINGS: BRAIN AND VENTRICLES: No acute intracranial hemorrhage. No mass effect or midline shift. No extra-axial fluid collection. No evidence of acute infarct. No hydrocephalus. No abnormal enhancement. Postoperative appearance of the left  cerebellum and posterior temporal lobe is unchanged. Shunt catheter tip near the left interventricular foramen. ORBITS: No acute abnormality. SINUSES AND MASTOIDS: No acute abnormality. SOFT TISSUES AND SKULL: No focal bone lesion. Fluid collection overlying the left occipital craniectomy has increased in size to 3.2 x 7.8 cm. IMPRESSION: 1. No acute intracranial abnormality. 2. Postoperative appearance of the left cerebellum and posterior temporal lobe, unchanged. 3. 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 Electronically signed by: Franky Stanford MD 04/01/2024 11:49 PM EDT  RP Workstation: [id number]   MR Lumbar Spine W Wo Contrast Result Date: 04/01/2024 EXAM: MR Lumbar Spine with and without intravenous contrast. 04/01/2024 06:22:00 PM TECHNIQUE: Multiplanar multisequence MRI of the lumbar spine was performed with and without the administration of intravenous contrast. COMPARISON: None available CLINICAL HISTORY: Low back pain, symptoms persist with > 6 weeks treatment; fever, right foot paresthesia, severe midline back pain. Lumbar drain for CSF placed on 03/18/24. Patient has a history of brain tumor removal in July and recent hospitalization. FINDINGS: BONES AND ALIGNMENT: Rightward curvature is centered at L4. Normal vertebral body heights. Normal bone marrow signal. No acute fracture. SPINAL CORD: The conus terminates normally. Dural enhancement is present along the lower spinal cord. SOFT TISSUES: No acute abnormality. L1-L2: No disc herniation. No spinal canal stenosis or neural foraminal narrowing. L2-L3: No disc herniation. No spinal canal stenosis or neural foraminal narrowing. L3-L4: No disc herniation. No spinal canal stenosis or neural foraminal narrowing. L4-L5: No disc herniation. No spinal canal stenosis or neural foraminal narrowing. L5-S1: No disc herniation. No spinal canal stenosis or neural foraminal narrowing. A  heterogeneously enhancing mass lesion posterior to the S2 vertebral body measures 10 x 11 x 26 mm. Ill-defined intrathecal enhancement is present in the lower lumbar spine. IMPRESSION: 1. Heterogeneously enhancing mass lesion posterior to the S2 vertebral body measuring 10 x 11 x 26 mm. This is an atypical location but most consistent with an appendicitis. 2. Ill-defined intrathecal enhancement in the lower lumbar spine and dural enhancement along the lower spinal cord. Findings are concerning for infection. Recommend lumbar puncture. Electronically signed by: Lonni Necessary MD 04/01/2024 07:23 PM EDT RP Workstation: [id number]   DG Chest Port 1 View Result Date: 04/01/2024 EXAM: 1 VIEW XRAY OF THE CHEST 04/01/2024 04:10:00 PM COMPARISON: None available. CLINICAL HISTORY: Questionable sepsis - evaluate for abnormality. Reason for exam: questionable sepsis - eval for abnormality. Patient had a back brace that was unable to come off for x-ray. FINDINGS: LUNGS AND PLEURA: Low lung volumes. Mild bibasilar atelectasis. No overt pulmonary edema. No acute consolidative airspace disease. No pleural effusion. No pneumothorax. HEART AND MEDIASTINUM: Stable mild cardiomegaly. BONES AND SOFT TISSUES: S-shaped thoracolumbar spinal curvature. No acute osseous abnormality. LINES AND TUBES: Intact VP shunt tubing over right neck and chest in appropriate position. IMPRESSION: 1. Low lung volumes with mild bibasilar atelectasis. 2. Stable mild cardiomegaly without overt edema. Electronically signed by: Selinda Blue MD 04/01/2024 04:49 PM EDT RP Workstation: [id number]           LOS: 0 days   Time spent= 35 mins    Burgess JAYSON Dare, MD [facility]  If 7PM-7AM, please contact night-coverage  04/02/2024, 11:04 AM

## 2024-04-02 NOTE — Plan of Care (Signed)
  Problem: Clinical Measurements: Goal: Ability to maintain clinical measurements within normal limits will improve Outcome: Progressing Goal: Will remain free from infection Outcome: Progressing Goal: Diagnostic test results will improve Outcome: Progressing Goal: Respiratory complications will improve Outcome: Progressing Goal: Cardiovascular complication will be avoided Outcome: Progressing   Problem: Elimination: Goal: Will not experience complications related to bowel motility Outcome: Progressing Goal: Will not experience complications related to urinary retention Outcome: Progressing   Problem: Pain Managment: Goal: General experience of comfort will improve and/or be controlled Outcome: Progressing   Problem: Safety: Goal: Ability to remain free from injury will improve Outcome: Progressing   Problem: Skin Integrity: Goal: Risk for impaired skin integrity will decrease Outcome: Progressing

## 2024-04-02 NOTE — Consult Note (Signed)
 [facility]    Date of Admission:  04/01/2024     Total days of antibiotics 2  Vancomycin   Cefepime               Reason for Consult: Meningitis, NSGY recently     Referring Provider: Caleen Primary Care Provider: Patient, No Pcp Per   Assessment: Alexa [name] is a 53 y.o. female admitted with:    Meningitis -  Recent VPS Placed 8/24 - Fever -  Concern for post neurosurgery meningitis following meningioma resection 02/21/24 that was revised on 7/28; required readmission for concern over CSF leak necessitating wound debridement and lumbar drain placement on 03/18/24 with VP shunt placement on 03/24/24. - continue vancomycin  & cefepime   - follow micro data once LP available  **No need to run the meningoencephalitis biofire panel as this misses nosocomial organisms and is really only most useful in community onset meningitis.  - FU NSGY plans   Medication Monitoring -  FU creatinine and vanc levels.   Plan: Continue vanc and cefepime   FU LP studies and NSGY plan     Principal Problem:   Meningitis spinal Active Problems:   Sepsis (HCC)    atenolol   50 mg Oral Daily   Chlorhexidine  Gluconate Cloth  6 each Topical Daily   docusate sodium   100 mg Oral BID   gabapentin   300 mg Oral BID   polyethylene glycol  17 g Oral BID   sodium chloride  flush  3 mL Intravenous Q12H    HPI: Alexa [name] is a 53 y.o. female from home via EMS with reports of new severe sharp stabbing pains in back with fever and bilateral distal neuropathy symptoms in feet. S/P meningioma resection 7/29 with readmission on 8/24 which required drain placement which required admission for management of CSF leak concern. MRI done in ER with dural enhancement concerning for meningitis.   LP was attempted in ER with EDP but unsuccessful. She is slated to go to IR soon. She has some headaches and neck stiffness that is improving. No vision changes. +Fevers. 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 +back pain    Review of Systems: Review of Systems  Constitutional: Negative.  Negative for chills and fever.  Eyes:  Negative for blurred vision, double vision, photophobia and pain.  Musculoskeletal:  Positive for neck pain.  Neurological:  Positive for tingling and headaches.    Past Medical History:  Diagnosis Date   Anemia    Anxiety    Depression    GERD (gastroesophageal reflux disease)    Headache    Hypertension    Meningioma (HCC)    PONV (postoperative nausea and vomiting)     Social History   Tobacco Use   Smoking status: Never   Smokeless tobacco: Never  Vaping Use   Vaping status: Never Used  Substance Use Topics   Alcohol  use: Yes    Comment: socially, maybe a glass every few months   Drug use: No    Family History  Problem Relation Age of Onset   Hypertension Mother    Heart disease Mother    Hypertension Father    Kidney disease Father    Ulcers Father    Diabetes Father        many paternal family members also with diabetes   Ovarian cancer Maternal Grandmother    Cirrhosis Paternal Grandfather        alcohol  related  Thyroid  cancer Maternal Aunt    Colon cancer Neg Hx    Esophageal cancer Neg Hx    Pancreatic cancer Neg Hx    Stomach cancer Neg Hx    Allergies  Allergen Reactions   Watermelon [Citrullus Vulgaris] Nausea And Vomiting    OBJECTIVE: Blood pressure (!) 150/90, pulse (!) 104, temperature 99.8 F (37.7 C), temperature source Oral, resp. rate 20, height 5' 8 (1.727 m), weight 100.7 kg, SpO2 96%.  Physical Exam Constitutional:      Appearance: Normal appearance. She is not ill-appearing.  HENT:     Mouth/Throat:     Mouth: Mucous membranes are moist.     Pharynx: Oropharynx is clear.  Eyes:     General: No scleral icterus. Cardiovascular:     Rate and Rhythm: Normal rate and regular rhythm.  Pulmonary:     Effort: Pulmonary effort is normal.  Abdominal:     General: Bowel sounds are  normal.     Palpations: Abdomen is soft.  Skin:    General: Skin is warm and dry.     Capillary Refill: Capillary refill takes less than 2 seconds.  Neurological:     Mental Status: She is oriented to person, place, and time.  Psychiatric:        Mood and Affect: Mood normal.        Thought Content: Thought content normal.     Lab Results Lab Results  Component Value Date   WBC 5.0 04/02/2024   HGB 8.2 (L) 04/02/2024   HCT 25.9 (L) 04/02/2024   MCV 88.7 04/02/2024   PLT 473 (H) 04/02/2024    Lab Results  Component Value Date   CREATININE 0.75 04/02/2024   BUN <5 (L) 04/02/2024   NA 139 04/02/2024   K 4.1 04/02/2024   CL 101 04/02/2024   CO2 25 04/02/2024    Lab Results  Component Value Date   ALT 11 04/01/2024   AST 13 (L) 04/01/2024   ALKPHOS 47 04/01/2024   BILITOT 0.2 04/01/2024     Microbiology: Recent Results (from the past 240 hours)  Resp panel by RT-PCR (RSV, Flu A&B, Covid) Anterior Nasal Swab     Status: None   Collection Time: 04/01/24  4:04 PM   Specimen: Anterior Nasal Swab  Result Value Ref Range Status   SARS Coronavirus 2 by RT PCR NEGATIVE NEGATIVE Final   Influenza A by PCR NEGATIVE NEGATIVE Final   Influenza B by PCR NEGATIVE NEGATIVE Final    Comment: (NOTE) The Xpert Xpress SARS-CoV-2/FLU/RSV plus assay is intended as an aid in the diagnosis of influenza from Nasopharyngeal swab specimens and should not be used as a sole basis for treatment. Nasal washings and aspirates are unacceptable for Xpert Xpress SARS-CoV-2/FLU/RSV testing.  Fact Sheet for Patients: BloggerCourse.com  Fact Sheet for Healthcare Providers: SeriousBroker.it  This test is not yet approved or cleared by the [place]  FDA and has been authorized for detection and/or diagnosis of SARS-CoV-2 by FDA under an Emergency Use Authorization (EUA). This EUA will remain in effect (meaning this test can be used) for the  duration of the COVID-19 declaration under Section 564(b)(1) of the Act, 21 U.S.C. section 360bbb-3(b)(1), unless the authorization is terminated or revoked.     Resp Syncytial Virus by PCR NEGATIVE NEGATIVE Final    Comment: (NOTE) Fact Sheet for Patients: BloggerCourse.com  Fact Sheet for Healthcare Providers: SeriousBroker.it  This test is not yet approved or cleared by the [place]  FDA  and has been authorized for detection and/or diagnosis of SARS-CoV-2 by FDA under an Emergency Use Authorization (EUA). This EUA will remain in effect (meaning this test can be used) for the duration of the COVID-19 declaration under Section 564(b)(1) of the Act, 21 U.S.C. section 360bbb-3(b)(1), unless the authorization is terminated or revoked.  Performed at [facility], [address] [place], [place] [address]   Blood Culture (routine x 2)     Status: None (Preliminary result)   Collection Time: 04/01/24  4:04 PM   Specimen: BLOOD  Result Value Ref Range Status   Specimen Description BLOOD RIGHT ANTECUBITAL  Final   Special Requests   Final    BOTTLES DRAWN AEROBIC AND ANAEROBIC Blood Culture results may not be optimal due to an inadequate volume of blood received in culture bottles   Culture   Final    NO GROWTH < 24 HOURS Performed at [facility], [address], [place], [place] [address]    Report Status PENDING  Incomplete  Blood Culture (routine x 2)     Status: None (Preliminary result)   Collection Time: 04/01/24  4:04 PM   Specimen: BLOOD  Result Value Ref Range Status   Specimen Description BLOOD LEFT ANTECUBITAL  Final   Special Requests   Final    BOTTLES DRAWN AEROBIC AND ANAEROBIC Blood Culture adequate volume   Culture   Final    NO GROWTH < 24 HOURS Performed at [facility], [address] [place], [place] [address]    Report Status PENDING  Incomplete    Corean Fireman, MSN,  NP-C [facility] Pager: [phone]  04/02/2024 8:50 AM    Total Encounter Time: 20 m

## 2024-04-02 NOTE — Progress Notes (Signed)
 Patient ID: Alexa [name], female   DOB: Nov 09, 1970, 53 y.o.   MRN: [id number] BP 137/78 (BP Location: Right Arm)   Pulse 87   Temp 99.3 F (37.4 C) (Oral)   Resp 18   Ht 5' 8 (1.727 m)   Wt 100.7 kg   SpO2 97%   BMI 33.76 kg/m  Alert, oriented x 4. Denies any change in mental status.  Significant pain in lower back, and lower extremities.  Csf collection in suboccipital region is larger and more tense. I decreased the shunt pressure to 90mm H2O this evening.  Will see what the results from the tap are. Continue antibiotics.

## 2024-04-03 DIAGNOSIS — G039 Meningitis, unspecified: Secondary | ICD-10-CM | POA: Diagnosis not present

## 2024-04-03 LAB — BASIC METABOLIC PANEL WITH GFR
Anion gap: 10 (ref 5–15)
BUN: 10 mg/dL (ref 6–20)
CO2: 23 mmol/L (ref 22–32)
Calcium: 8.5 mg/dL — ABNORMAL LOW (ref 8.9–10.3)
Chloride: 101 mmol/L (ref 98–111)
Creatinine, Ser: 0.76 mg/dL (ref 0.44–1.00)
GFR, Estimated: >60 mL/min (ref >60)
Glucose, Bld: 112 mg/dL — ABNORMAL HIGH (ref 70–99)
Potassium: 4.2 mmol/L (ref 3.5–5.1)
Sodium: 134 mmol/L — ABNORMAL LOW (ref 135–145)

## 2024-04-03 LAB — CBC
HCT: 25 % — ABNORMAL LOW (ref 36.0–46.0)
Hemoglobin: 8.1 g/dL — ABNORMAL LOW (ref 12.0–15.0)
MCH: 28.2 pg (ref 26.0–34.0)
MCHC: 32.4 g/dL (ref 30.0–36.0)
MCV: 87.1 fL (ref 80.0–100.0)
Platelets: 398 K/uL (ref 150–400)
RBC: 2.87 MIL/uL — ABNORMAL LOW (ref 3.87–5.11)
RDW: 12.5 % (ref 11.5–15.5)
WBC: 6.9 K/uL (ref 4.0–10.5)
nRBC: 0 % (ref 0.0–0.2)

## 2024-04-03 LAB — NO BLOOD PRODUCTS

## 2024-04-03 LAB — PROTEIN AND GLUCOSE, CSF
Glucose, CSF: <20 mg/dL — CL (ref 40–70)
Total  Protein, CSF: 563 mg/dL — ABNORMAL HIGH (ref 15–45)

## 2024-04-03 LAB — PHOSPHORUS: Phosphorus: 4.3 mg/dL (ref 2.5–4.6)

## 2024-04-03 LAB — MAGNESIUM: Magnesium: 1.8 mg/dL (ref 1.7–2.4)

## 2024-04-03 LAB — PATHOLOGIST SMEAR REVIEW: Path Review: NEGATIVE

## 2024-04-03 MED ORDER — DEXAMETHASONE SODIUM PHOSPHATE 10 MG/ML IJ SOLN
10.0000 mg | Freq: Once | INTRAMUSCULAR | Status: AC
  Administered 2024-04-03: 10 mg via INTRAVENOUS
  Filled 2024-04-03: qty 1

## 2024-04-03 NOTE — Plan of Care (Signed)
  Problem: Education: Goal: Knowledge of General Education information will improve Description: Including pain rating scale, medication(s)/side effects and non-pharmacologic comfort measures Outcome: Progressing   Problem: Health Behavior/Discharge Planning: Goal: Ability to manage health-related needs will improve Outcome: Progressing   Problem: Clinical Measurements: Goal: Ability to maintain clinical measurements within normal limits will improve Outcome: Progressing Goal: Diagnostic test results will improve Outcome: Progressing Goal: Respiratory complications will improve Outcome: Progressing Goal: Cardiovascular complication will be avoided Outcome: Progressing   Problem: Nutrition: Goal: Adequate nutrition will be maintained Outcome: Progressing   Problem: Coping: Goal: Level of anxiety will decrease Outcome: Progressing   Problem: Elimination: Goal: Will not experience complications related to bowel motility Outcome: Progressing Goal: Will not experience complications related to urinary retention Outcome: Progressing   Problem: Pain Managment: Goal: General experience of comfort will improve and/or be controlled Outcome: Progressing   Problem: Safety: Goal: Ability to remain free from injury will improve Outcome: Progressing   Problem: Skin Integrity: Goal: Risk for impaired skin integrity will decrease Outcome: Progressing

## 2024-04-03 NOTE — Progress Notes (Signed)
 Patient ID: Alexa [name], female   DOB: 1971-05-28, 53 y.o.   MRN: [id number] BP (!) 140/77 (BP Location: Left Arm)   Pulse 86   Temp 98.3 F (36.8 C) (Oral)   Resp 18   Ht 5' 8 (1.727 m)   Wt 103.7 kg   SpO2 95%   BMI 34.76 kg/m  Afebrile Csf showed neutrophils, elevated protein, decreased glucose. Consistent with infection. Will continue abx.

## 2024-04-03 NOTE — Progress Notes (Signed)
 [facility]  Date of Admission:  04/01/2024      Total days of antibiotics 3  Vanc  Cefepime          ASSESSMENT: Alexa [name] is a 53 y.o. 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 Dr. Gillie following. A/P CT scan shows normal appearing distal shunt Will follow cultures - gram stain was negative after 1.5 days of abx before procedure. Will continue with empiric coverage vancomycin  + cefepime  given presumed nosocomial onset.  Suspect she will need 2-3 weeks of IV antibiotics which can be done in hospital, home or during rehab stay.   Lumbosacral radiculopathy and paraesthesias -  If the low back pain is related to bacterial meningitis (given recent lumbar drain to manage csf leak complication) I suspect this would improve with antibiotics and time as the nerve roots calm down. She did have meningeal enhancement on L spine imaging.   Deconditioned -  She worries she will need temporary rehab stay    PLAN: Continue vancomycin  + cefepime   FU cultures from LP  2x weekly BMP and creatinine levels  Vanc level to follow after more doses - appreciate pharmacy team.  FU any NSGY plans     Principal Problem:   Meningitis spinal Active Problems:   Sepsis (HCC)    atenolol   50 mg Oral Daily   Chlorhexidine  Gluconate Cloth  6 each Topical Daily   docusate sodium   100 mg Oral BID   gabapentin   300 mg Oral BID   polyethylene glycol  17 g Oral BID   sodium chloride  flush  3 mL Intravenous Q12H    SUBJECTIVE: Her mother and aunt are here at bedside with her.  The gabapentin  has really helped her feet pain. She does not feel as if she is  weak, just painful feet/LEs. She is worried about going home because her family is having a hard time getting her the care she needs.   Still in a fair amount of pain with her back. No headaches.   Review of Systems: Review of Systems  Constitutional:  Positive for malaise/fatigue. Negative for chills and fever.  Respiratory:  Negative for cough.   Cardiovascular:  Negative for chest pain.  Gastrointestinal:  Negative for abdominal pain, nausea and vomiting.  Musculoskeletal:  Positive for back pain.  Neurological:  Positive for tingling.    Allergies  Allergen Reactions   Watermelon [Citrullus Vulgaris] Nausea And Vomiting    OBJECTIVE: Vitals:   04/03/24 0400 04/03/24 0500 04/03/24 0822 04/03/24 1123  BP: 120/80  138/87 139/85  Pulse: 88  88 87  Resp: 16  16 18   Temp: 99 F (37.2 C)  98.6 F (37 C) 99.1 F (37.3 C)  TempSrc: Axillary  Oral Oral  SpO2: 96%  99% 94%  Weight:  103.7 kg    Height:       Body mass index is 34.76 kg/m.  Physical Exam Vitals reviewed.  Cardiovascular:     Rate and Rhythm: Normal rate and regular rhythm.  Pulmonary:     Effort: Pulmonary effort is normal.  Abdominal:     General: Bowel  sounds are normal. There is no distension.     Palpations: Abdomen is soft.  Musculoskeletal:        General: Normal range of motion.  Skin:    General: Skin is warm and dry.  Neurological:     Mental Status: She is alert and oriented to person, place, and time.     Lab Results Lab Results  Component Value Date   WBC 6.9 04/03/2024   HGB 8.1 (L) 04/03/2024   HCT 25.0 (L) 04/03/2024   MCV 87.1 04/03/2024   PLT 398 04/03/2024    Lab Results  Component Value Date   CREATININE 0.76 04/03/2024   BUN 10 04/03/2024   NA 134 (L) 04/03/2024   K 4.2 04/03/2024   CL 101 04/03/2024   CO2 23 04/03/2024    Lab Results  Component Value Date   ALT 11 04/01/2024   AST 13 (L) 04/01/2024   ALKPHOS 47 04/01/2024   BILITOT 0.2 04/01/2024      Microbiology: Recent Results (from the past 240 hours)  Resp panel by RT-PCR (RSV, Flu A&B, Covid) Anterior Nasal Swab     Status: None   Collection Time: 04/01/24  4:04 PM   Specimen: Anterior Nasal Swab  Result Value Ref Range Status   SARS Coronavirus 2 by RT PCR NEGATIVE NEGATIVE Final   Influenza A by PCR NEGATIVE NEGATIVE Final   Influenza B by PCR NEGATIVE NEGATIVE Final    Comment: (NOTE) The Xpert Xpress SARS-CoV-2/FLU/RSV plus assay is intended as an aid in the diagnosis of influenza from Nasopharyngeal swab specimens and should not be used as a sole basis for treatment. Nasal washings and aspirates are unacceptable for Xpert Xpress SARS-CoV-2/FLU/RSV testing.  Fact Sheet for Patients: BloggerCourse.com  Fact Sheet for Healthcare Providers: SeriousBroker.it  This test is not yet approved or cleared by the [place]  FDA and has been authorized for detection and/or diagnosis of SARS-CoV-2 by FDA under an Emergency Use Authorization (EUA). This EUA will remain in effect (meaning this test can be used) for the duration of the COVID-19 declaration under Section 564(b)(1) of the Act, 21 U.S.C. section 360bbb-3(b)(1), unless the authorization is terminated or revoked.     Resp Syncytial Virus by PCR NEGATIVE NEGATIVE Final    Comment: (NOTE) Fact Sheet for Patients: BloggerCourse.com  Fact Sheet for Healthcare Providers: SeriousBroker.it  This test is not yet approved or cleared by the [place]  FDA and has been authorized for detection and/or diagnosis of SARS-CoV-2 by FDA under an Emergency Use Authorization (EUA). This EUA will remain in effect (meaning this test can be used) for the duration of the COVID-19 declaration under Section 564(b)(1) of the Act, 21 U.S.C. section 360bbb-3(b)(1), unless the authorization is terminated or revoked.  Performed at  [facility], [address] [place], [place] [address]   Blood Culture (routine x 2)     Status: None (Preliminary result)   Collection Time: 04/01/24  4:04 PM   Specimen: BLOOD  Result Value Ref Range Status   Specimen Description BLOOD RIGHT ANTECUBITAL  Final   Special Requests   Final    BOTTLES DRAWN AEROBIC AND ANAEROBIC Blood Culture results may not be optimal due to an inadequate volume of blood received in culture bottles   Culture   Final    NO GROWTH 2 DAYS Performed at [facility], [address], [place], [place] [address]    Report Status PENDING  Incomplete  Blood Culture (routine  x 2)     Status: None (Preliminary result)   Collection Time: 04/01/24  4:04 PM   Specimen: BLOOD  Result Value Ref Range Status   Specimen Description BLOOD LEFT ANTECUBITAL  Final   Special Requests   Final    BOTTLES DRAWN AEROBIC AND ANAEROBIC Blood Culture adequate volume   Culture   Final    NO GROWTH 2 DAYS Performed at [facility], [address] [place], [place] [address]    Report Status PENDING  Incomplete  CSF culture w Gram Stain     Status: None (Preliminary result)   Collection Time: 04/02/24  4:35 PM   Specimen: A: PATH Cytology CSF; Cerebrospinal Fluid   B: PATH Cytology CSF; Cerebrospinal Fluid  Result Value Ref Range Status   Specimen Description CSF  Final   Special Requests NONE  Final   Gram Stain   Final    ABUNDANT WBC PRESENT,BOTH PMN AND MONONUCLEAR NO ORGANISMS SEEN    Culture   Final    NO GROWTH < 24 HOURS Performed at [facility], [address] [place], [place] [address]    Report Status PENDING  Incomplete    Corean Fireman, MSN, NP-C [facility] Pager: [phone]  @TODAY @ 12:43 PM   Total Encounter Time: 12 m

## 2024-04-03 NOTE — Progress Notes (Signed)
 PROGRESS NOTE    Alexa [name]  FMW:[id number] DOB: 1970-09-21 DOA: 04/01/2024 PCP: Patient, No Pcp Per  Chief Complaint  Patient presents with   Back Pain    Brief Narrative:   53 year old with history of HTN, mood disorder, recent meningioma resection 7/29 - 7/30 with Dr. Gillie readmitted on 8/17 with CSF leak from craniotomy site underwent lumbar drain placement on 8/18. Eventually underwent VP shunt placement on 8/24 with laparoscopic access with Dr. Stevie. Now presents with lower back pain along with fevers concerning for spinal meningitis. MRI shows dural enhancement of the lumbar region associated with S2 mass. Neurosurgery and infectious disease has been consulted.   Assessment & Plan:   Principal Problem:   Meningitis spinal Active Problems:   Sepsis [facility])  Healthcare associated spinal meningitis, in setting of recent neurosurgical procedures  HX recent Meningioma resection 7/29, redo 7/30  Hx recent CSF leak from craniectomy, Lumbar drain 8/18, + VP shunt with laparoscopic placement on 8/24  -MRI lumbar spine consistent with dural enhancement/infection.  CT head and CT abdomen pelvis unremarkable.  Seen by neurosurgery. - s/p LP by IR -> elevated protein, low glucose.  Elevated WBC with 95% neutrophils.  Culture pending.   -- appreciate neurosurgery assistance - Currently on empiric antibiotics.  Consulted ID   Lumbosacral radiculopathy, R foot paresthesia  Some lumbar radiculopathy especially towards right lower extremity.  Neurosurgery team is following. Gabapentin  300 mg BID   ? 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:  -- NSGY may manage and followup on this as well     Asymmetric RLE edema, mild  No evidence DVT in lower extremity    HTN  Continue home Atenolol . Hold home hydrochlorothiazide  I/s/o sepsis.    Mood d/o Has not been taking bupropion  or Lexapro    Obesity Class I  Body mass index is 31.93 kg/m. Obesity class I  would benefit from weight loss outpatient    DVT prophylaxis: SCD Code Status: full Family Communication: none Disposition:   Status is: Inpatient Remains inpatient appropriate because: need for continued inpatient care   Consultants:  Neurology ID  Procedures:  9/2 IMPRESSION: 1. Fluoroscopically-guided L5-S1 lumbar puncture. 2. Opening pressure: 35.5 cm water 3. 13 mL of CSF collected and sent for laboratory studies. 4. No immediate post-procedure complication.  9/1 LP Multiple attempts without access to CSF   Antimicrobials:  Anti-infectives (From admission, onward)    Start     Dose/Rate Route Frequency Ordered Stop   04/02/24 1000  vancomycin  (VANCOCIN ) 1,000 mg in sodium chloride  0.9 % 250 mL IVPB        1,000 mg 250 mL/hr over 60 Minutes Intravenous Every 8 hours 04/02/24 0004     04/02/24 0000  ceFEPIme  (MAXIPIME ) 2 g in sodium chloride  0.9 % 100 mL IVPB        2 g 200 mL/hr over 30 Minutes Intravenous Every 8 hours 04/01/24 2136     04/02/24 0000  vancomycin  (VANCOREADY) IVPB 2000 mg/400 mL        2,000 mg 200 mL/hr over 120 Minutes Intravenous  Once 04/01/24 2242 04/02/24 0245       Subjective: Has been feeling Alexa [name] bit better each day  Objective: Vitals:   04/03/24 0400 04/03/24 0500 04/03/24 0822 04/03/24 1123  BP: 120/80  138/87 139/85  Pulse: 88  88 87  Resp: 16  16 18   Temp: 99 F (37.2 C)  98.6 F (37 C) 99.1 F (  37.3 C)  TempSrc: Axillary  Oral Oral  SpO2: 96%  99% 94%  Weight:  103.7 kg    Height:        Intake/Output Summary (Last 24 hours) at 04/03/2024 1319 Last data filed at 04/03/2024 9062 Gross per 24 hour  Intake 4247.15 ml  Output 2100 ml  Net 2147.15 ml   Filed Weights   04/01/24 1445 04/02/24 0628 04/03/24 0500  Weight: 95.3 kg 100.7 kg 103.7 kg    Examination:  General exam: Appears calm and comfortable  Respiratory system: unlabored Cardiovascular system: RRR Central nervous system: Alert and oriented. No focal  neurological deficits. MSK: TTP to L paraspinal region to lumbar spine  Extremities: no LEE    Data Reviewed: I have personally reviewed following labs and imaging studies  CBC: Recent Labs  Lab 04/01/24 1544 04/02/24 0442 04/03/24 0331  WBC 8.9 5.0 6.9  NEUTROABS 5.6  --   --   HGB 8.4* 8.2* 8.1*  HCT 26.4* 25.9* 25.0*  MCV 88.3 88.7 87.1  PLT 559* 473* 398    Basic Metabolic Panel: Recent Labs  Lab 04/01/24 1544 04/02/24 0442 04/03/24 0331  NA 134* 139 134*  K 3.5 4.1 4.2  CL 98 101 101  CO2 25 25 23   GLUCOSE 106* 109* 112*  BUN <5* <5* 10  CREATININE 0.77 0.75 0.76  CALCIUM  9.1 9.2 8.5*  MG  --  2.0 1.8  PHOS  --  3.7 4.3    GFR: Estimated Creatinine Clearance: 102.5 mL/min (by C-G formula based on SCr of 0.76 mg/dL).  Liver Function Tests: Recent Labs  Lab 04/01/24 1544  AST 13*  ALT 11  ALKPHOS 47  BILITOT 0.2  PROT 7.1  ALBUMIN  3.2*    CBG: No results for input(s): GLUCAP in the last 168 hours.   Recent Results (from the past 240 hours)  Resp panel by RT-PCR (RSV, Flu Alexa [name]&B, Covid) Anterior Nasal Swab     Status: None   Collection Time: 04/01/24  4:04 PM   Specimen: Anterior Nasal Swab  Result Value Ref Range Status   SARS Coronavirus 2 by RT PCR NEGATIVE NEGATIVE Final   Influenza Alexa [name] by PCR NEGATIVE NEGATIVE Final   Influenza B by PCR NEGATIVE NEGATIVE Final    Comment: (NOTE) The Xpert Xpress SARS-CoV-2/FLU/RSV plus assay is intended as an aid in the diagnosis of influenza from Nasopharyngeal swab specimens and should not be used as Alexa [name] sole basis for treatment. Nasal washings and aspirates are unacceptable for Xpert Xpress SARS-CoV-2/FLU/RSV testing.  Fact Sheet for Patients: BloggerCourse.com  Fact Sheet for Healthcare Providers: SeriousBroker.it  This test is not yet approved or cleared by the [place]  FDA and has been authorized for detection and/or diagnosis of SARS-CoV-2  by FDA under an Emergency Use Authorization (EUA). This EUA will remain in effect (meaning this test can be used) for the duration of the COVID-19 declaration under Section 564(b)(1) of the Act, 21 U.S.C. section 360bbb-3(b)(1), unless the authorization is terminated or revoked.     Resp Syncytial Virus by PCR NEGATIVE NEGATIVE Final    Comment: (NOTE) Fact Sheet for Patients: BloggerCourse.com  Fact Sheet for Healthcare Providers: SeriousBroker.it  This test is not yet approved or cleared by the [place]  FDA and has been authorized for detection and/or diagnosis of SARS-CoV-2 by FDA under an Emergency Use Authorization (EUA). This EUA will remain in effect (meaning this test can be used) for the duration of the COVID-19 declaration under Section 564(b)(1)  of the Act, 21 U.S.C. section 360bbb-3(b)(1), unless the authorization is terminated or revoked.  Performed at [facility], [address] [place], [place] [address]   Blood Culture (routine x 2)     Status: None (Preliminary result)   Collection Time: 04/01/24  4:04 PM   Specimen: BLOOD  Result Value Ref Range Status   Specimen Description BLOOD RIGHT ANTECUBITAL  Final   Special Requests   Final    BOTTLES DRAWN AEROBIC AND ANAEROBIC Blood Culture results may not be optimal due to an inadequate volume of blood received in culture bottles   Culture   Final    NO GROWTH 2 DAYS Performed at [facility], [address] [place], [place] [address]    Report Status PENDING  Incomplete  Blood Culture (routine x 2)     Status: None (Preliminary result)   Collection Time: 04/01/24  4:04 PM   Specimen: BLOOD  Result Value Ref Range Status   Specimen Description BLOOD LEFT ANTECUBITAL  Final   Special Requests   Final    BOTTLES DRAWN AEROBIC AND ANAEROBIC Blood Culture adequate volume   Culture   Final    NO GROWTH 2 DAYS Performed at [facility]  Lab, [address], [place], [place] [address]    Report Status PENDING  Incomplete  CSF culture w Gram Stain     Status: None (Preliminary result)   Collection Time: 04/02/24  4:35 PM   Specimen: Alexa [name]: PATH Cytology CSF; Cerebrospinal Fluid   B: PATH Cytology CSF; Cerebrospinal Fluid  Result Value Ref Range Status   Specimen Description CSF  Final   Special Requests NONE  Final   Gram Stain   Final    ABUNDANT WBC PRESENT,BOTH PMN AND MONONUCLEAR NO ORGANISMS SEEN    Culture   Final    NO GROWTH < 24 HOURS Performed at [facility], [address], [place], [place] [address]    Report Status PENDING  Incomplete         Radiology Studies: DG FL GUIDED LUMBAR PUNCTURE Result Date: 04/02/2024 CLINICAL DATA:  Provided history: Altered mental status. Additional history: Headaches, concern for meningitis, request received for fluoroscopically-guided lumbar puncture for CSF analysis. EXAM: LUMBAR PUNCTURE UNDER FLUOROSCOPY PROCEDURE: The patient was positioned prone on the fluoroscopy table. An appropriate skin entry site was determined under fluoroscopy and marked. The operator donned sterile gloves and Alexa [name] mask. The lower back was prepped and draped in the usual sterile fashion. Local anesthesia was provided with 1% lidocaine . Under intermittent fluoroscopy, lumbar puncture was performed at the L5-S1 level using Alexa [name] 20 gauge spinal needle with return of brown-tinged CSF and an opening pressure of 35.5 cm water. 13 mL of CSF were collected for laboratory studies. The inner stylet was replaced within the needle and the needle was removed in its entirety. Alexa [name] dressing was applied at the skin entry site. The patient tolerated the procedure well and no immediate post-procedure complication was apparent. The procedure was performed by Carlin Griffon, PA-C, supervised by Dr. Rockey [name]. FLUOROSCOPY: Radiation Exposure Index (as provided by the fluoroscopic device): 17.90 mGy Kerma IMPRESSION: 1.  Fluoroscopically-guided L5-S1 lumbar puncture. 2. Opening pressure: 35.5 cm water 3. 13 mL of CSF collected and sent for laboratory studies. 4. No immediate post-procedure complication. Electronically Signed   By: Alexa [name] D.O.   On: 04/02/2024 16:51   VAS US  LOWER EXTREMITY VENOUS (DVT) Result Date: 04/02/2024  Lower Venous  DVT Study Patient Name:  Alexa [name]  Date of Exam:   04/02/2024 Medical Rec #: [id number]         Accession #:    [id number] Date of Birth: March 16, 1971        Patient Gender: F Patient Age:   49 years Exam Location:  [facility] Procedure:      VAS US  LOWER EXTREMITY VENOUS (DVT) Referring Phys: Alexa [name] --------------------------------------------------------------------------------  Indications: Swelling, and right foot numbness.  Risk Factors: Surgery S/P SHUNT INSERTION VENTRICULAR-PERITONEAL 03/25/24. Comparison Study: No priors. Performing Technologist: Alexa [name] RDMS, RVT  Examination Guidelines: Alexa [name] complete evaluation includes B-mode imaging, spectral Doppler, color Doppler, and power Doppler as needed of all accessible portions of each vessel. Bilateral testing is considered an integral part of Alexa [name] complete examination. Limited examinations for reoccurring indications may be performed as noted. The reflux portion of the exam is performed with the patient in reverse Trendelenburg.  +---------+---------------+---------+-----------+----------+--------------+ RIGHT    CompressibilityPhasicitySpontaneityPropertiesThrombus Aging +---------+---------------+---------+-----------+----------+--------------+ CFV      Full           Yes      Yes                                 +---------+---------------+---------+-----------+----------+--------------+ SFJ      Full                                                        +---------+---------------+---------+-----------+----------+--------------+ FV Prox  Full                                                         +---------+---------------+---------+-----------+----------+--------------+ FV Mid   Full           Yes      Yes                                 +---------+---------------+---------+-----------+----------+--------------+ FV DistalFull                                                        +---------+---------------+---------+-----------+----------+--------------+ PFV      Full                                                        +---------+---------------+---------+-----------+----------+--------------+ POP      Full           Yes      Yes                                 +---------+---------------+---------+-----------+----------+--------------+ PTV      Full                                                        +---------+---------------+---------+-----------+----------+--------------+  PERO     Full                                                        +---------+---------------+---------+-----------+----------+--------------+   +----+---------------+---------+-----------+----------+--------------+ LEFTCompressibilityPhasicitySpontaneityPropertiesThrombus Aging +----+---------------+---------+-----------+----------+--------------+ CFV Full           Yes      Yes                                 +----+---------------+---------+-----------+----------+--------------+ SFJ Full                                                        +----+---------------+---------+-----------+----------+--------------+    Summary: RIGHT: - There is no evidence of deep vein thrombosis in the lower extremity.  - No cystic structure found in the popliteal fossa.  LEFT: - No evidence of common femoral vein obstruction.   *See table(s) above for measurements and observations. Electronically signed by Alexa [name] on 04/02/2024 at 4:45:25 PM.    Final    CT ABDOMEN PELVIS W CONTRAST Result Date: 04/01/2024 CLINICAL DATA:  History of recent ventricular  peritoneal shunt with back pain, initial encounter EXAM: CT ABDOMEN AND PELVIS WITH CONTRAST TECHNIQUE: Multidetector CT imaging of the abdomen and pelvis was performed using the standard protocol following bolus administration of intravenous contrast. RADIATION DOSE REDUCTION: This exam was performed according to the departmental dose-optimization program which includes automated exposure control, adjustment of the mA and/or kV according to patient size and/or use of iterative reconstruction technique. CONTRAST:  75mL OMNIPAQUE  IOHEXOL  350 MG/ML SOLN COMPARISON:  None Available. FINDINGS: Lower chest: Mild left basilar atelectasis is noted. Hepatobiliary: No focal liver abnormality is seen. No gallstones, gallbladder wall thickening, or biliary dilatation. Pancreas: Unremarkable. No pancreatic ductal dilatation or surrounding inflammatory changes. Spleen: Normal in size without focal abnormality. Adrenals/Urinary Tract: Adrenal glands are within normal limits. Kidneys are well visualized bilaterally. Single cyst is noted in the lower pole the left kidney. No follow-up is recommended. Bladder is partially distended. Foley catheter is noted in place. Stomach/Bowel: No obstructive or inflammatory changes of the colon are noted. Scattered fecal material is noted throughout the colon. Appendix is not well visualized. Inflammatory changes to suggest appendicitis are seen. Small bowel and stomach appear within normal limits. Vascular/Lymphatic: No significant vascular findings are present. No enlarged abdominal or pelvic lymph nodes. Reproductive: Uterus and bilateral adnexa are unremarkable. Other: Ventricular peritoneal shunt is identified extending into the abdominal cavity. The catheter tip lies in the anterior right lower quadrant. No significant free fluid is noted. Musculoskeletal: The known enhancing mass posterior to the S2 vertebral body is not well appreciated on this exam. Postprocedural changes are noted  along the left paraspinal lumbar tissues related to recent lumbar puncture. No acute bony abnormality is noted. IMPRESSION: Ventricular peritoneal shunt in satisfactory position. Mild left basilar atelectasis. Known mass lesion at S2 is not well appreciated on this exam Electronically Signed   By: Alexa Devonshire M.D.   On: 04/01/2024 23:53   CT HEAD W & WO CONTRAST ( ) Result Date: 04/01/2024 EXAM: CT HEAD WITH AND WITHOUT  CONTRAST 04/01/2024 11:23:40 PM TECHNIQUE: CT of the head was performed with and without the administration of intravenous contrast. Automated exposure control, iterative reconstruction, and/or weight based adjustment of the mA/kV was utilized to reduce the radiation dose to as low as reasonably achievable. COMPARISON: 03/23/2024 CLINICAL HISTORY: Hx of recent neurosurgical procedures, septic, eval for infectious complication. Severe back pain worsening post procedure. FINDINGS: BRAIN AND VENTRICLES: No acute intracranial hemorrhage. No mass effect or midline shift. No extra-axial fluid collection. No evidence of acute infarct. No hydrocephalus. No abnormal enhancement. Postoperative appearance of the left cerebellum and posterior temporal lobe is unchanged. Shunt catheter tip near the left interventricular foramen. ORBITS: No acute abnormality. SINUSES AND MASTOIDS: No acute abnormality. SOFT TISSUES AND SKULL: No focal bone lesion. Fluid collection overlying the left occipital craniectomy has increased in size to 3.2 x 7.8 cm. IMPRESSION: 1. No acute intracranial abnormality. 2. Postoperative appearance of the left cerebellum and posterior temporal lobe, unchanged. 3. 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 Electronically signed by: Alexa Stanford MD 04/01/2024 11:49 PM EDT RP Workstation: [id number]   MR Lumbar Spine W Tommye Contrast Result Date: 04/01/2024 EXAM: MR Lumbar Spine with and without intravenous contrast.  04/01/2024 06:22:00 PM TECHNIQUE: Multiplanar multisequence MRI of the lumbar spine was performed with and without the administration of intravenous contrast. COMPARISON: None available CLINICAL HISTORY: Low back pain, symptoms persist with > 6 weeks treatment; fever, right foot paresthesia, severe midline back pain. Lumbar drain for CSF placed on 03/18/24. Patient has Kathee Tumlin history of brain tumor removal in July and recent hospitalization. FINDINGS: BONES AND ALIGNMENT: Rightward curvature is centered at L4. Normal vertebral body heights. Normal bone marrow signal. No acute fracture. SPINAL CORD: The conus terminates normally. Dural enhancement is present along the lower spinal cord. SOFT TISSUES: No acute abnormality. L1-L2: No disc herniation. No spinal canal stenosis or neural foraminal narrowing. L2-L3: No disc herniation. No spinal canal stenosis or neural foraminal narrowing. L3-L4: No disc herniation. No spinal canal stenosis or neural foraminal narrowing. L4-L5: No disc herniation. No spinal canal stenosis or neural foraminal narrowing. L5-S1: No disc herniation. No spinal canal stenosis or neural foraminal narrowing. Dupree Givler heterogeneously enhancing mass lesion posterior to the S2 vertebral body measures 10 x 11 x 26 mm. Ill-defined intrathecal enhancement is present in the lower lumbar spine. IMPRESSION: 1. Heterogeneously enhancing mass lesion posterior to the S2 vertebral body measuring 10 x 11 x 26 mm. This is an atypical location but most consistent with an appendicitis. 2. Ill-defined intrathecal enhancement in the lower lumbar spine and dural enhancement along the lower spinal cord. Findings are concerning for infection. Recommend lumbar puncture. Electronically signed by: Lonni Necessary MD 04/01/2024 07:23 PM EDT RP Workstation: [id number]   DG Chest Port 1 View Result Date: 04/01/2024 EXAM: 1 VIEW XRAY OF THE CHEST 04/01/2024 04:10:00 PM COMPARISON: None available. CLINICAL HISTORY: Questionable  sepsis - evaluate for abnormality. Reason for exam: questionable sepsis - eval for abnormality. Patient had Edman Lipsey back brace that was unable to come off for x-ray. FINDINGS: LUNGS AND PLEURA: Low lung volumes. Mild bibasilar atelectasis. No overt pulmonary edema. No acute consolidative airspace disease. No pleural effusion. No pneumothorax. HEART AND MEDIASTINUM: Stable mild cardiomegaly. BONES AND SOFT TISSUES: S-shaped thoracolumbar spinal curvature. No acute osseous abnormality. LINES AND TUBES: Intact VP shunt tubing over right neck and chest in appropriate position. IMPRESSION: 1. Low lung volumes with mild bibasilar atelectasis. 2. Stable mild cardiomegaly  without overt edema. Electronically signed by: Selinda Blue MD 04/01/2024 04:49 PM EDT RP Workstation: [id number]        Scheduled Meds:  atenolol   50 mg Oral Daily   Chlorhexidine  Gluconate Cloth  6 each Topical Daily   docusate sodium   100 mg Oral BID   gabapentin   300 mg Oral BID   polyethylene glycol  17 g Oral BID   sodium chloride  flush  3 mL Intravenous Q12H   Continuous Infusions:  ceFEPime  (MAXIPIME ) IV 2 g (04/03/24 0834)   lactated ringers      lactated ringers  100 mL/hr at 04/03/24 0937   vancomycin  1,000 mg (04/03/24 1015)     LOS: 1 day    Time spent: over 30 min     Meliton Monte, MD [facility]   To contact the attending provider between 7A-7P or the covering provider during after hours 7P-7A, please log into the web site [email or web address] and access using universal [place] password for that web site. If you do not have the password, please call the hospital operator.  04/03/2024, 1:19 PM

## 2024-04-04 DIAGNOSIS — M5417 Radiculopathy, lumbosacral region: Secondary | ICD-10-CM

## 2024-04-04 DIAGNOSIS — G039 Meningitis, unspecified: Secondary | ICD-10-CM | POA: Diagnosis not present

## 2024-04-04 DIAGNOSIS — G009 Bacterial meningitis, unspecified: Secondary | ICD-10-CM

## 2024-04-04 LAB — CBC
HCT: 23.4 % — ABNORMAL LOW (ref 36.0–46.0)
Hemoglobin: 7.7 g/dL — ABNORMAL LOW (ref 12.0–15.0)
MCH: 28.1 pg (ref 26.0–34.0)
MCHC: 32.9 g/dL (ref 30.0–36.0)
MCV: 85.4 fL (ref 80.0–100.0)
Platelets: 457 K/uL — ABNORMAL HIGH (ref 150–400)
RBC: 2.74 MIL/uL — ABNORMAL LOW (ref 3.87–5.11)
RDW: 12.6 % (ref 11.5–15.5)
WBC: 9.3 K/uL (ref 4.0–10.5)
nRBC: 0 % (ref 0.0–0.2)

## 2024-04-04 LAB — BASIC METABOLIC PANEL WITH GFR
Anion gap: 10 (ref 5–15)
BUN: 9 mg/dL (ref 6–20)
CO2: 23 mmol/L (ref 22–32)
Calcium: 8.6 mg/dL — ABNORMAL LOW (ref 8.9–10.3)
Chloride: 100 mmol/L (ref 98–111)
Creatinine, Ser: 0.74 mg/dL (ref 0.44–1.00)
GFR, Estimated: >60 mL/min (ref >60)
Glucose, Bld: 133 mg/dL — ABNORMAL HIGH (ref 70–99)
Potassium: 3.8 mmol/L (ref 3.5–5.1)
Sodium: 133 mmol/L — ABNORMAL LOW (ref 135–145)

## 2024-04-04 LAB — MAGNESIUM: Magnesium: 1.7 mg/dL (ref 1.7–2.4)

## 2024-04-04 LAB — VANCOMYCIN, TROUGH: Vancomycin Tr: 17 ug/mL (ref 15–20)

## 2024-04-04 MED ORDER — GABAPENTIN 300 MG PO CAPS
300.0000 mg | ORAL_CAPSULE | Freq: Three times a day (TID) | ORAL | Status: DC
Start: 2024-04-04 — End: 2024-04-08
  Administered 2024-04-04 – 2024-04-08 (×12): 300 mg via ORAL
  Filled 2024-04-04 (×12): qty 1

## 2024-04-04 MED ORDER — MAGNESIUM SULFATE 2 GM/50ML IV SOLN
2.0000 g | Freq: Once | INTRAVENOUS | Status: AC
  Administered 2024-04-04: 2 g via INTRAVENOUS
  Filled 2024-04-04: qty 50

## 2024-04-04 NOTE — Plan of Care (Signed)

## 2024-04-04 NOTE — Evaluation (Signed)
 Occupational Therapy Evaluation Patient Details Name: Alexa [name] MRN: [id number] DOB: 16-Nov-1970 Today's Date: 04/04/2024   History of Present Illness   53 year old with  recent meningioma resection 7/29 - 7/30 with Dr. Gillie readmitted on 8/17 with CSF leak from craniotomy site underwent lumbar drain placement on 8/18. Eventually underwent VP shunt placement on 8/24. Admitted with lower back pain along with fevers concerning for spinal meningitis. MRI shows dural enhancement of the lumbar region associated with S2 mass. PMH: HTN, mood disorder.     Clinical Impressions PTA pt lives with her elderly Mom and sister who have been assisting her since recent hospitalizations. 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 Pt reports 2 recent falls at home and requiring increased assistance with mobility and ADL tasks due to generalized weakness and pain. Pt states she had to wear depends as she was unable to get to the bathroom in time. Pt requires min A with short distance ambulation @ 20 ft and mod A with ADL tasks due to below listed deficits. Pt's goal is to be independent and reduce caregiver burden. At this time recommend intensive inpatient follow-up therapy, >3 hours/day to DC home @ modified independent level. Acute OT to follow.      If plan is discharge home, recommend the following:   A little help with walking and/or transfers;A little help with bathing/dressing/bathroom;Assistance with cooking/housework;Assist for transportation;Help with stairs or ramp for entrance     Functional Status Assessment   Patient has had a recent decline in their functional status and demonstrates the ability to make significant improvements in function in a reasonable and predictable amount of time.     Equipment Recommendations   None recommended by OT (pt has all needed DME)     Recommendations for Other Services   PT  consult     Precautions/Restrictions   Precautions Precautions: Fall Recall of Precautions/Restrictions: Intact Precaution/Restrictions Comments: SBP 90-160 Restrictions Weight Bearing Restrictions Per Provider Order: No     Mobility Bed Mobility               General bed mobility comments: OOB in chair    Transfers Overall transfer level: Needs assistance Equipment used: Rolling walker (2 wheels) Transfers: Sit to/from Stand Sit to Stand: Min assist                  Balance Overall balance assessment: Needs assistance Sitting-balance support: No upper extremity supported, Feet supported Sitting balance-Leahy Scale: Good     Standing balance support: No upper extremity supported, During functional activity Standing balance-Leahy Scale: Fair Standing balance comment: static standing without AD. Reviewed standing balance exercises including SL stance and tandem stance                           ADL either performed or assessed with clinical judgement   ADL Overall ADL's : Needs assistance/impaired Eating/Feeding: Sitting;Independent   Grooming: Set up;Wash/dry hands;Sitting;Supervision/safety   Upper Body Bathing: Set up;Sitting   Lower Body Bathing: Moderate assistance;Sit to/from stand   Upper Body Dressing : Set up;Sitting   Lower Body Dressing: Moderate assistance;Sit to/from stand   Toilet Transfer: Minimal assistance;Ambulation;Rolling walker (2 wheels)   Toileting- Clothing Manipulation and Hygiene: Minimal assistance;Sit to/from stand       Functional mobility during ADLs: Minimal assistance General ADL Comments: urgency with urination and has difficulty getting to the toilet in time  Vision Baseline Vision/History: 1 Wears glasses Vision Assessment?: No apparent visual deficits     Perception Perception: Not tested       Praxis Praxis: Not tested       Pertinent Vitals/Pain Pain Assessment Pain Assessment:  0-10 Faces Pain Scale: Hurts whole lot Pain Location: lower back;pain/tingling in feet Pain Descriptors / Indicators: Pins and needles, Aching Pain Intervention(s): Limited activity within patient's tolerance, Premedicated before session     Extremity/Trunk Assessment Upper Extremity Assessment Upper Extremity Assessment: Overall WFL for tasks assessed   Lower Extremity Assessment Lower Extremity Assessment: Defer to PT evaluation (RLE weaker than L; neuropathy per pt)   Cervical / Trunk Assessment Cervical / Trunk Assessment: Normal   Communication Communication Communication: No apparent difficulties   Cognition Arousal: Alert Behavior During Therapy: WFL for tasks assessed/performed Cognition: Cognition impaired                               Following commands: Intact       Cueing  General Comments   Cueing Techniques: Verbal cues      Exercises     Shoulder Instructions      Home Living Family/patient expects to be discharged to:: Private residence Living Arrangements: Other relatives (lives with Mom and sister) Available Help at Discharge: Family;Available 24 hours/day Type of Home: House Home Access: Ramped entrance     Home Layout: One level     Bathroom Shower/Tub: Producer, television/film/video: Standard Bathroom Accessibility: Yes How Accessible: Accessible via walker Home Equipment: Rolling Walker (2 wheels);Cane - single point;Rollator (4 wheels);Shower seat;Wheelchair - manual          Prior Functioning/Environment Prior Level of Function : Independent/Modified Independent;Working/employed;Driving             Mobility Comments: Was using the rollator after the surgery in July; increased dizziness  with CSF leaks and was needing it more; started using the RW for at least 1 week PTA ADLs Comments: increased assistance  needed for ADL tasks due increased weakness; was wearing depends due inability to get to the bathroom.     OT Problem List: Impaired balance (sitting and/or standing);Decreased activity tolerance;Decreased strength;Decreased safety awareness;Decreased knowledge of use of DME or AE;Obesity;Pain   OT Treatment/Interventions: Self-care/ADL training;Therapeutic exercise;Energy conservation;DME and/or AE instruction;Therapeutic activities;Patient/family education;Balance training;Cognitive remediation/compensation;Visual/perceptual remediation/compensation      OT Goals(Current goals can be found in the care plan section)   Acute Rehab OT Goals Patient Stated Goal: to be independent OT Goal Formulation: With patient Time For Goal Achievement: 04/18/24 Potential to Achieve Goals: Good   OT Frequency:  Min 2X/week    Co-evaluation              AM-PAC OT 6 Clicks Daily Activity     Outcome Measure Help from another person eating meals?: None Help from another person taking care of personal grooming?: A Little Help from another person toileting, which includes using toliet, bedpan, or urinal?: A Little Help from another person bathing (including washing, rinsing, drying)?: A Lot Help from another person to put on and taking off regular upper body clothing?: A Little Help from another person to put on and taking off regular lower body clothing?: A Lot 6 Click Score: 17   End of Session Equipment Utilized During Treatment: Gait belt;Rolling walker (2 wheels) Nurse Communication: Mobility status  Activity Tolerance: Patient tolerated treatment well Patient left: in chair;with call bell/phone  within reach  OT Visit Diagnosis: Unsteadiness on feet (R26.81);Other abnormalities of gait and mobility (R26.89);Muscle weakness (generalized) (M62.81);Pain Pain - part of body:  (back; Legs)                Time: 8472-8449 OT Time Calculation (min): 23 min Charges:  OT General Charges $OT Visit: 1 Visit OT Evaluation $OT Eval Moderate Complexity: 1 Mod OT Treatments $Self Care/Home  Management : 8-22 mins  Kreg Sink, OT/L   Acute OT Clinical Specialist Acute Rehabilitation Services Pager [phone] Office [phone]   [facility] 04/04/2024, 4:03 PM

## 2024-04-04 NOTE — Progress Notes (Signed)
 Patient ID: Alexa [name], female   DOB: 1971-01-10, 53 y.o.   MRN: [id number] BP (!) 108/51 (BP Location: Right Arm)   Pulse 81   Temp 98.3 F (36.8 C) (Oral)   Resp 18   Ht 5' 8 (1.727 m)   Wt 105.4 kg   SpO2 100%   BMI 35.33 kg/m  Back pain is improved.  Csf collection is less tense. Will monitor closely Agree with inpatient rehab if available Alert and oriented x 4, speech is clear and fluent Moving all extremities.

## 2024-04-04 NOTE — Progress Notes (Signed)
 PROGRESS NOTE    Alexa [name]  FMW:[id number] DOB: [birth year] DOA: 04/01/2024 PCP: Patient, No Pcp Per  Chief Complaint  Patient presents with   Back Pain    Brief Narrative:   53 year old with history of HTN, mood disorder, recent meningioma resection 7/29 - 7/30 with Dr. Gillie readmitted on 8/17 with CSF leak from craniotomy site underwent lumbar drain placement on 8/18. Eventually underwent VP shunt placement on 8/24 with laparoscopic access with Dr. Stevie. Now presents with lower back pain along with fevers concerning for spinal meningitis. MRI shows dural enhancement of the lumbar region associated with S2 mass. Neurosurgery and infectious disease has been consulted.   Assessment & Plan:   Principal Problem:   Meningitis spinal Active Problems:   Sepsis Alexa [name])  Healthcare associated spinal meningitis, in setting of recent neurosurgical procedures  HX recent Meningioma resection 7/29, redo 7/30  Hx recent CSF leak from craniectomy, Lumbar drain 8/18, + VP shunt with laparoscopic placement on 8/24  -MRI lumbar spine consistent with dural enhancement/infection.  CT head and CT abdomen pelvis unremarkable.  Seen by neurosurgery. - s/p LP by IR -> elevated protein, low glucose.  Elevated WBC with 95% neutrophils.  Culture pending.   -- appreciate neurosurgery assistance - Currently on empiric antibiotics.  Consulted ID   Lumbosacral radiculopathy, R foot paresthesia  Some lumbar radiculopathy especially towards right lower extremity.  Neurosurgery team is following. Gabapentin  300 mg TID   ? 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:  -- NSGY may manage and followup on this as well   Reviewed read with rads, it should be reviewed and addended by prior reader (pending)   Asymmetric RLE edema, mild  No evidence DVT in lower extremity    HTN  Continue home Atenolol . Hold home hydrochlorothiazide  I/s/o sepsis.    Mood d/o Has not been taking  bupropion  or Lexapro    Obesity Class I  Body mass index is 31.93 kg/m. Obesity class I would benefit from weight loss outpatient    DVT prophylaxis: SCD Code Status: full Family Communication: none Disposition:   Status is: Inpatient Remains inpatient appropriate because: need for continued inpatient care   Consultants:  Neurology ID  Procedures:  9/2 IMPRESSION: 1. Fluoroscopically-guided L5-S1 lumbar puncture. 2. Opening pressure: 35.5 cm water 3. 13 mL of CSF collected and sent for laboratory studies. 4. No immediate post-procedure complication.  9/1 LP Multiple attempts without access to CSF   Antimicrobials:  Anti-infectives (From admission, onward)    Start     Dose/Rate Route Frequency Ordered Stop   04/02/24 1000  vancomycin  (VANCOCIN ) 1,000 mg in sodium chloride  0.9 % 250 mL IVPB        1,000 mg 250 mL/hr over 60 Minutes Intravenous Every 8 hours 04/02/24 0004     04/02/24 0000  ceFEPIme  (MAXIPIME ) 2 g in sodium chloride  0.9 % 100 mL IVPB        2 g 200 mL/hr over 30 Minutes Intravenous Every 8 hours 04/01/24 2136     04/02/24 0000  vancomycin  (VANCOREADY) IVPB 2000 mg/400 mL        2,000 mg 200 mL/hr over 120 Minutes Intravenous  Once 04/01/24 2242 04/02/24 0245       Subjective: No new complaints - continued back pain  Objective: Vitals:   04/04/24 0535 04/04/24 0753 04/04/24 1146 04/04/24 1703  BP:  123/65 131/77 (!) 108/51  Pulse:  98 88 81  Resp:  18  18  Temp:  98.1 F (36.7 C) 98.9 F (37.2 C) 98.3 F (36.8 C)  TempSrc:  Oral Oral Oral  SpO2:  100% 99% 100%  Weight: 105.4 kg     Height:        Intake/Output Summary (Last 24 hours) at 04/04/2024 1842 Last data filed at 04/04/2024 0715 Gross per 24 hour  Intake 1220.3 ml  Output 4100 ml  Net -2879.7 ml   Filed Weights   04/02/24 0628 04/03/24 0500 04/04/24 0535  Weight: 100.7 kg 103.7 kg 105.4 kg    Examination:  General: No acute distress. Cardiovascular: RRR Lungs:  unlabored Neurological: Alert and oriented 3. Symmetric LE strength. Extremities: No clubbing or cyanosis. No edema.    Data Reviewed: I have personally reviewed following labs and imaging studies  CBC: Recent Labs  Lab 04/01/24 1544 04/02/24 0442 04/03/24 0331 04/04/24 0258  WBC 8.9 5.0 6.9 9.3  NEUTROABS 5.6  --   --   --   HGB 8.4* 8.2* 8.1* 7.7*  HCT 26.4* 25.9* 25.0* 23.4*  MCV 88.3 88.7 87.1 85.4  PLT 559* 473* 398 457*    Basic Metabolic Panel: Recent Labs  Lab 04/01/24 1544 04/02/24 0442 04/03/24 0331 04/04/24 0258  NA 134* 139 134* 133*  K 3.5 4.1 4.2 3.8  CL 98 101 101 100  CO2 25 25 23 23   GLUCOSE 106* 109* 112* 133*  BUN <5* <5* 10 9  CREATININE 0.77 0.75 0.76 0.74  CALCIUM  9.1 9.2 8.5* 8.6*  MG  --  2.0 1.8 1.7  PHOS  --  3.7 4.3  --     GFR: Estimated Creatinine Clearance: 103.4 mL/min (by C-G formula based on SCr of 0.74 mg/dL).  Liver Function Tests: Recent Labs  Lab 04/01/24 1544  AST 13*  ALT 11  ALKPHOS 47  BILITOT 0.2  PROT 7.1  ALBUMIN  3.2*    CBG: No results for input(s): GLUCAP in the last 168 hours.   Recent Results (from the past 240 hours)  Resp panel by RT-PCR (RSV, Flu Kandas Oliveto&B, Covid) Anterior Nasal Swab     Status: None   Collection Time: 04/01/24  4:04 PM   Specimen: Anterior Nasal Swab  Result Value Ref Range Status   SARS Coronavirus 2 by RT PCR NEGATIVE NEGATIVE Final   Influenza Danis [place] by PCR NEGATIVE NEGATIVE Final   Influenza B by PCR NEGATIVE NEGATIVE Final    Comment: (NOTE) The Xpert Xpress SARS-CoV-2/FLU/RSV plus assay is intended as an aid in the diagnosis of influenza from Nasopharyngeal swab specimens and should not be used as [name] sole basis for treatment. Nasal washings and aspirates are unacceptable for Xpert Xpress SARS-CoV-2/FLU/RSV testing.  Fact Sheet for Patients: BloggerCourse.com  Fact Sheet for Healthcare Providers: SeriousBroker.it  This test  is not yet approved or cleared by the [place]  FDA and has been authorized for detection and/or diagnosis of SARS-CoV-2 by FDA under an Emergency Use Authorization (EUA). This EUA will remain in effect (meaning this test can be used) for the duration of the COVID-19 declaration under Section 564(b)(1) of the Act, 21 U.S.C. section 360bbb-3(b)(1), unless the authorization is terminated or revoked.     Resp Syncytial Virus by PCR NEGATIVE NEGATIVE Final    Comment: (NOTE) Fact Sheet for Patients: BloggerCourse.com  Fact Sheet for Healthcare Providers: SeriousBroker.it  This test is not yet approved or cleared by the [place]  FDA and has been authorized for detection and/or diagnosis of SARS-CoV-2 by FDA under an Emergency  Use Authorization (EUA). This EUA will remain in effect (meaning this test can be used) for the duration of the COVID-19 declaration under Section 564(b)(1) of the Act, 21 U.S.C. section 360bbb-3(b)(1), unless the authorization is terminated or revoked.  Performed at [name], [address] [place], [place] [address]   Blood Culture (routine x 2)     Status: None (Preliminary result)   Collection Time: 04/01/24  4:04 PM   Specimen: BLOOD  Result Value Ref Range Status   Specimen Description BLOOD RIGHT ANTECUBITAL  Final   Special Requests   Final    BOTTLES DRAWN AEROBIC AND ANAEROBIC Blood Culture results may not be optimal due to an inadequate volume of blood received in culture bottles   Culture   Final    NO GROWTH 3 DAYS Performed at [facility], [address], [place], [place] [address]    Report Status PENDING  Incomplete  Blood Culture (routine x 2)     Status: None (Preliminary result)   Collection Time: 04/01/24  4:04 PM   Specimen: BLOOD  Result Value Ref Range Status   Specimen Description BLOOD LEFT ANTECUBITAL  Final   Special Requests   Final    BOTTLES DRAWN  AEROBIC AND ANAEROBIC Blood Culture adequate volume   Culture   Final    NO GROWTH 3 DAYS Performed at [facility], [address] [place], [place] [address]    Report Status PENDING  Incomplete  CSF culture w Gram Stain     Status: None (Preliminary result)   Collection Time: 04/02/24  4:35 PM   Specimen: Praveen Coia: PATH Cytology CSF; Cerebrospinal Fluid   B: PATH Cytology CSF; Cerebrospinal Fluid  Result Value Ref Range Status   Specimen Description CSF  Final   Special Requests NONE  Final   Gram Stain   Final    ABUNDANT WBC PRESENT,BOTH PMN AND MONONUCLEAR NO ORGANISMS SEEN    Culture   Final    NO GROWTH 2 DAYS CONTINUING TO HOLD Performed at [facility], [address], [place], [place] [address]    Report Status PENDING  Incomplete         Radiology Studies: No results found.       Scheduled Meds:  atenolol   50 mg Oral Daily   Chlorhexidine  Gluconate Cloth  6 each Topical Daily   docusate sodium   100 mg Oral BID   gabapentin   300 mg Oral TID   polyethylene glycol  17 g Oral BID   sodium chloride  flush  3 mL Intravenous Q12H   Continuous Infusions:  ceFEPime  (MAXIPIME ) IV 2 g (04/04/24 1606)   vancomycin  1,000 mg (04/04/24 1832)     LOS: 2 days    Time spent: over 30 min     Meliton Monte, MD [facility]   To contact the attending provider between 7A-7P or the covering provider during after hours 7P-7A, please log into the web site [email or web address] and access using universal [place] password for that web site. If you do not have the password, please call the [name] operator.  04/04/2024, 6:42 PM

## 2024-04-04 NOTE — Progress Notes (Signed)
 Pharmacy Antibiotic Note  Alexa [name] is a 53 y.o. female for which pharmacy has been consulted for vancomycin  dosing for sepsis/meningitis r/o.  Patient with a history of recent meningioma resection w/ subsequent readmission with CSF leak from craniotomy site now is s/p lumbar drain on 8/18 and placement of a VP shunt on 8/24.   Patient presenting with severe low back pain. Imaging concerning for likely developing epidural abscess / spinal meningitis.  SCr 0.74 COVID neg / flu neg; blood cultures = NGTD, CSF culture = NGTD  MRI: intrathecal enhancement in the lower lumbar spine and dural enhancement along the lower spinal cord  Vancomycin  trough 17 (04/04/24), drawn 6 hours and 41 minutes after previous dose (administered later than intended). Therefore, calculated true trough is estimated to be 16.42, which is within targeted trough goals of 15-20.   Plan: Continue cefepime  2g IV every 8 hours Continue vancomycin  1000 mg q8h (VT 15-20) Levels at steady state Monitor WBC, fever, renal function, cultures   Height: 5' 8 (172.7 cm) Weight: 105.4 kg (232 lb 5.8 oz) IBW/kg (Calculated) : 63.9  Temp (24hrs), Avg:98.5 F (36.9 C), Min:97.7 F (36.5 C), Max:99.2 F (37.3 C)  Recent Labs  Lab 04/01/24 1544 04/01/24 1610 04/02/24 0442 04/03/24 0331 04/04/24 0258 04/04/24 0907  WBC 8.9  --  5.0 6.9 9.3  --   CREATININE 0.77  --  0.75 0.76 0.74  --   LATICACIDVEN  --  0.8  --   --   --   --   VANCOTROUGH  --   --   --   --   --  17    Estimated Creatinine Clearance: 103.4 mL/min (by C-G formula based on SCr of 0.74 mg/dL).    Allergies  Allergen Reactions   Watermelon [Citrullus Vulgaris] Nausea And Vomiting   Microbiology results: 9/1 Bcx = NGTD 9/2 CSF cx = NGTD   Thank you for allowing pharmacy to be a part of this patient's care.  Feliciano Close, PharmD PGY2 Infectious Diseases Pharmacy Resident  04/04/2024 1:44 PM

## 2024-04-04 NOTE — Plan of Care (Signed)

## 2024-04-04 NOTE — Plan of Care (Signed)
 Id brief note  Have asked micro to keep csf culture for 2 weeks for p-acnes in setting of shunt present  No change in current ID plan for now

## 2024-04-04 NOTE — Progress Notes (Signed)
 [facility]  Date of Admission:  04/01/2024      Total days of antibiotics 5  Vanc   Cefepime          ASSESSMENT: Alexa [name] is a 53 y.o. 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 Dr. Gillie following - no plans for VP shunt removal now.  A/P CT scan shows normal appearing distal shunt. CSF Cultures - gram stain negative and now culture negative day 4.  She is feeling better and hopefully preparing for CIR stay. Will continue with broad spectrum abx coverage vancomycin  + cefepime  given nosocomial risk factors. Anticipate 3 weeks of treatment    Lumbosacral radiculopathy and paraesthesias -  If the low back pain is related to bacterial meningitis (given recent lumbar drain to manage csf leak complication) I suspect this would improve with antibiotics and time as the nerve roots calm down. She did have meningeal enhancement on L spine imaging.   Deconditioned -  She worries she will need temporary rehab stay - D/W Dr. Perri   Vascular Access -  -PICC ordered -Home health orders to maintain PICC line care and education for patient described below --> though she may be able to finish out course in CIR.   Discharge Planning / Coordination of Care -  -Outpatient antibiotics set -Discussed with Holley Herring, ID pharmacy and primary   Medication Monitoring -  -Safety labs ordered and detailed below to be followed in OPAT clinic  ID will sign off - please call back with any questions/concerns or if we can be of further assistance.    PLAN: Continue vancomycin  + cefepime   Place PICC line (ordered) D/W Dr. Perri  IF she  goes to CIR - please re consult ID at the end of treatment or discharge to coordinate follow up    OPAT ORDERS:  Diagnosis: Bacterial Meningitis, Nosocomial   Culture Result: negative day 4 (pending, holding for 2 weeks)  Allergies  Allergen Reactions   Watermelon [Citrullus Vulgaris] Nausea And Vomiting    Vancomycin  dosed per pharmacy to achieve trough 15-20  +  Cefepime  2 gm IV TID    Duration: 2 weeks  End Date: 04/15/2024  [facility] Care Per Protocol with Bi[employer] Use: Home health RN for IV administration and teaching, line care and labs.    Labs weekly while on IV antibiotics: _x_ CBC with differential _x_ BMP **TWICE WEEKLY ON VANCOMYCIN   __ CMP __ CRP __ ESR _x_ Vancomycin  trough TWICE WEEKLY __ CK  _x_ Please pull PIC at completion of IV antibiotics __ Please leave PIC in place until doctor has seen patient or been notified  Fax weekly labs to [phone]  Clinic Follow Up Appt: 9/18 @ 9:15 with Dr. Overton     Principal Problem:   Meningitis spinal Active Problems:   Sepsis (HCC)    atenolol   50 mg Oral Daily   Chlorhexidine  Gluconate Cloth  6 each Topical Daily   docusate sodium   100 mg Oral BID   gabapentin   300 mg Oral BID   polyethylene glycol  17 g Oral BID   sodium chloride  flush  3 mL Intravenous Q12H    SUBJECTIVE: Sitting up in chair today and feeling better. Back pain is much improved.   Review of Systems: Review of Systems  Constitutional:  Negative for chills, fever and malaise/fatigue.  Respiratory:  Negative for cough.   Cardiovascular:  Negative for chest pain.  Gastrointestinal:  Negative for abdominal pain, nausea and vomiting.  Musculoskeletal:  Positive for back pain.  Neurological:  Negative for tingling.    Allergies  Allergen Reactions   Watermelon [Citrullus Vulgaris] Nausea And Vomiting    OBJECTIVE: Vitals:   04/03/24 2332 04/04/24 0334 04/04/24 0535 04/04/24 0753  BP: 132/76 (!) 117/91  123/65  Pulse: 80 87   98  Resp: 18 18  18   Temp: 99.2 F (37.3 C) 98.8 F (37.1 C)  98.1 F (36.7 C)  TempSrc: Oral Oral  Oral  SpO2: 97% 100%  100%  Weight:   105.4 kg   Height:       Body mass index is 35.33 kg/m.  Physical Exam Vitals reviewed.  Cardiovascular:     Rate and Rhythm: Normal rate and regular rhythm.  Pulmonary:     Effort: Pulmonary effort is normal.  Abdominal:     General: Bowel sounds are normal. There is no distension.     Palpations: Abdomen is soft.  Musculoskeletal:        General: Normal range of motion.  Skin:    General: Skin is warm and dry.  Neurological:     Mental Status: She is alert and oriented to person, place, and time.     Lab Results Lab Results  Component Value Date   WBC 9.3 04/04/2024   HGB 7.7 (L) 04/04/2024   HCT 23.4 (L) 04/04/2024   MCV 85.4 04/04/2024   PLT 457 (H) 04/04/2024    Lab Results  Component Value Date   CREATININE 0.74 04/04/2024   BUN 9 04/04/2024   NA 133 (L) 04/04/2024   K 3.8 04/04/2024   CL 100 04/04/2024   CO2 23 04/04/2024    Lab Results  Component Value Date   ALT 11 04/01/2024   AST 13 (L) 04/01/2024   ALKPHOS 47 04/01/2024   BILITOT 0.2 04/01/2024     Microbiology: Recent Results (from the past 240 hours)  Resp panel by RT-PCR (RSV, Flu A&B, Covid) Anterior Nasal Swab     Status: None   Collection Time: 04/01/24  4:04 PM   Specimen: Anterior Nasal Swab  Result Value Ref Range Status   SARS Coronavirus 2 by RT PCR NEGATIVE NEGATIVE Final   Influenza A by PCR NEGATIVE NEGATIVE Final   Influenza B by PCR NEGATIVE NEGATIVE Final    Comment: (NOTE) The Xpert Xpress SARS-CoV-2/FLU/RSV plus assay is intended as an aid in the diagnosis of influenza from Nasopharyngeal swab specimens and should not be used as a sole basis for treatment. Nasal washings and aspirates are unacceptable for Xpert Xpress SARS-CoV-2/FLU/RSV testing.  Fact Sheet for Patients: BloggerCourse.com  Fact Sheet  for Healthcare Providers: SeriousBroker.it  This test is not yet approved or cleared by the [place]  FDA and has been authorized for detection and/or diagnosis of SARS-CoV-2 by FDA under an Emergency Use Authorization (EUA). This EUA will remain in effect (meaning this test can be used) for the duration of the COVID-19 declaration under Section 564(b)(1) of the Act, 21 U.S.C. section 360bbb-3(b)(1), unless the authorization is terminated or revoked.     Resp Syncytial Virus by PCR NEGATIVE NEGATIVE Final  Comment: (NOTE) Fact Sheet for Patients: BloggerCourse.com  Fact Sheet for Healthcare Providers: SeriousBroker.it  This test is not yet approved or cleared by the [place]  FDA and has been authorized for detection and/or diagnosis of SARS-CoV-2 by FDA under an Emergency Use Authorization (EUA). This EUA will remain in effect (meaning this test can be used) for the duration of the COVID-19 declaration under Section 564(b)(1) of the Act, 21 U.S.C. section 360bbb-3(b)(1), unless the authorization is terminated or revoked.  Performed at [facility], [address] [place], [place] [address]   Blood Culture (routine x 2)     Status: None (Preliminary result)   Collection Time: 04/01/24  4:04 PM   Specimen: BLOOD  Result Value Ref Range Status   Specimen Description BLOOD RIGHT ANTECUBITAL  Final   Special Requests   Final    BOTTLES DRAWN AEROBIC AND ANAEROBIC Blood Culture results may not be optimal due to an inadequate volume of blood received in culture bottles   Culture   Final    NO GROWTH 3 DAYS Performed at [facility], [address], [place], [place] [address]    Report Status PENDING  Incomplete  Blood Culture (routine x 2)     Status: None (Preliminary result)   Collection Time: 04/01/24  4:04 PM   Specimen: BLOOD  Result Value Ref Range Status   Specimen  Description BLOOD LEFT ANTECUBITAL  Final   Special Requests   Final    BOTTLES DRAWN AEROBIC AND ANAEROBIC Blood Culture adequate volume   Culture   Final    NO GROWTH 3 DAYS Performed at [facility], [address], [place], [place] [address]    Report Status PENDING  Incomplete  CSF culture w Gram Stain     Status: None (Preliminary result)   Collection Time: 04/02/24  4:35 PM   Specimen: A: PATH Cytology CSF; Cerebrospinal Fluid   B: PATH Cytology CSF; Cerebrospinal Fluid  Result Value Ref Range Status   Specimen Description CSF  Final   Special Requests NONE  Final   Gram Stain   Final    ABUNDANT WBC PRESENT,BOTH PMN AND MONONUCLEAR NO ORGANISMS SEEN    Culture   Final    NO GROWTH < 24 HOURS Performed at [facility], [address], [place], [place] [address]    Report Status PENDING  Incomplete    Corean Fireman, MSN, NP-C [facility] Pager: [phone]  @TODAY @ 9:00 AM   Total Encounter Time: 12 m

## 2024-04-05 ENCOUNTER — Other Ambulatory Visit: Payer: Self-pay

## 2024-04-05 DIAGNOSIS — G039 Meningitis, unspecified: Secondary | ICD-10-CM | POA: Diagnosis not present

## 2024-04-05 LAB — CBC
HCT: 23.4 % — ABNORMAL LOW (ref 36.0–46.0)
Hemoglobin: 7.7 g/dL — ABNORMAL LOW (ref 12.0–15.0)
MCH: 28.4 pg (ref 26.0–34.0)
MCHC: 32.9 g/dL (ref 30.0–36.0)
MCV: 86.3 fL (ref 80.0–100.0)
Platelets: 479 K/uL — ABNORMAL HIGH (ref 150–400)
RBC: 2.71 MIL/uL — ABNORMAL LOW (ref 3.87–5.11)
RDW: 13 % (ref 11.5–15.5)
WBC: 7.1 K/uL (ref 4.0–10.5)
nRBC: 0 % (ref 0.0–0.2)

## 2024-04-05 LAB — MAGNESIUM: Magnesium: 2 mg/dL (ref 1.7–2.4)

## 2024-04-05 LAB — BASIC METABOLIC PANEL WITH GFR
Anion gap: 8 (ref 5–15)
BUN: 11 mg/dL (ref 6–20)
CO2: 26 mmol/L (ref 22–32)
Calcium: 8.6 mg/dL — ABNORMAL LOW (ref 8.9–10.3)
Chloride: 101 mmol/L (ref 98–111)
Creatinine, Ser: 0.78 mg/dL (ref 0.44–1.00)
GFR, Estimated: >60 mL/min (ref >60)
Glucose, Bld: 103 mg/dL — ABNORMAL HIGH (ref 70–99)
Potassium: 4.3 mmol/L (ref 3.5–5.1)
Sodium: 135 mmol/L (ref 135–145)

## 2024-04-05 MED ORDER — ACETAMINOPHEN 500 MG PO TABS
1000.0000 mg | ORAL_TABLET | Freq: Three times a day (TID) | ORAL | Status: DC
  Administered 2024-04-05 – 2024-04-08 (×10): 1000 mg via ORAL
  Filled 2024-04-05 (×10): qty 2

## 2024-04-05 MED ORDER — ACETAMINOPHEN 325 MG PO TABS: 650.0000 mg | ORAL_TABLET | Freq: Four times a day (QID) | ORAL | Status: DC | PRN

## 2024-04-05 MED ORDER — METHOCARBAMOL 500 MG PO TABS
500.0000 mg | ORAL_TABLET | Freq: Three times a day (TID) | ORAL | Status: DC
  Administered 2024-04-05 – 2024-04-08 (×11): 500 mg via ORAL
  Filled 2024-04-05 (×11): qty 1

## 2024-04-05 MED ORDER — SODIUM CHLORIDE 0.9% FLUSH
10.0000 mL | Freq: Two times a day (BID) | INTRAVENOUS | Status: DC
  Administered 2024-04-06: 10 mL

## 2024-04-05 MED ORDER — SODIUM CHLORIDE 0.9% FLUSH: 10.0000 mL | INTRAVENOUS | Status: DC | PRN

## 2024-04-05 NOTE — Plan of Care (Signed)
  Problem: Clinical Measurements: Goal: Ability to maintain clinical measurements within normal limits will improve Outcome: Progressing Goal: Will remain free from infection Outcome: Progressing Goal: Diagnostic test results will improve Outcome: Progressing Goal: Respiratory complications will improve Outcome: Progressing Goal: Cardiovascular complication will be avoided Outcome: Progressing   Problem: Activity: Goal: Risk for activity intolerance will decrease Outcome: Progressing   Problem: Elimination: Goal: Will not experience complications related to bowel motility Outcome: Progressing Goal: Will not experience complications related to urinary retention Outcome: Progressing   Problem: Pain Managment: Goal: General experience of comfort will improve and/or be controlled Outcome: Progressing   Problem: Safety: Goal: Ability to remain free from injury will improve Outcome: Progressing   Problem: Skin Integrity: Goal: Risk for impaired skin integrity will decrease Outcome: Progressing

## 2024-04-05 NOTE — Plan of Care (Signed)
  Problem: Education: Goal: Knowledge of General Education information will improve Description: Including pain rating scale, medication(s)/side effects and non-pharmacologic comfort measures Outcome: Progressing   Problem: Health Behavior/Discharge Planning: Goal: Ability to manage health-related needs will improve Outcome: Progressing   Problem: Clinical Measurements: Goal: Cardiovascular complication will be avoided Outcome: Progressing   Problem: Activity: Goal: Risk for activity intolerance will decrease Outcome: Progressing   Problem: Nutrition: Goal: Adequate nutrition will be maintained Outcome: Progressing   Problem: Coping: Goal: Level of anxiety will decrease Outcome: Progressing   Problem: Elimination: Goal: Will not experience complications related to bowel motility Outcome: Progressing Goal: Will not experience complications related to urinary retention Outcome: Progressing   Problem: Pain Managment: Goal: General experience of comfort will improve and/or be controlled Outcome: Progressing   Problem: Safety: Goal: Ability to remain free from injury will improve Outcome: Progressing   Problem: Skin Integrity: Goal: Risk for impaired skin integrity will decrease Outcome: Progressing

## 2024-04-05 NOTE — TOC Progression Note (Signed)
 Transition of Care [facility]) - Progression Note    Patient Details  Name: Alexa [name] MRN: [id number] Date of Birth: [birth year]  Transition of Care [facility]) CM/SW Contact  Andrez JULIANNA George, RN Phone Number: 04/05/2024, 3:18 PM  Clinical Narrative:     CIR working up for admission. IP Care management following for PCP needs.   Expected Discharge Plan: IP Rehab Facility Barriers to Discharge: Continued Medical Work up               Expected Discharge Plan and Services   Discharge Planning Services: CM Consult Post Acute Care Choice: Home Health Living arrangements for the past 2 months: Single Family Home                                       Social Drivers of Health (SDOH) Interventions SDOH Screenings   Food Insecurity: No Food Insecurity (04/03/2024)  Housing: Low Risk  (04/03/2024)  Transportation Needs: No Transportation Needs (04/03/2024)  Utilities: Not At Risk (04/03/2024)  Tobacco Use: Low Risk  (04/01/2024)    Readmission Risk Interventions     No data to display

## 2024-04-05 NOTE — Progress Notes (Signed)
 Patient ID: Alexa [name], female   DOB: Feb 02, 1971, 53 y.o.   MRN: [id number] BP 97/72 (BP Location: Left Arm)   Pulse 78   Temp 99 F (37.2 C) (Oral)   Resp 18   Ht 5' 8 (1.727 m)   Wt 102.6 kg   SpO2 100%   BMI 34.39 kg/m  Alert and oriented x 4,speech is clear and fluent Moving all extremities well Still with substantial Amount of fluid in wound, no leak. Feels better.  Will lower valve pressure tomorrow.

## 2024-04-05 NOTE — Progress Notes (Signed)
 Peripherally Inserted Central Catheter Placement  The IV Nurse has discussed with the patient and/or persons authorized to consent for the patient, the purpose of this procedure and the potential benefits and risks involved with this procedure.  The benefits include less needle sticks, lab draws from the catheter, and the patient may be discharged home with the catheter. Risks include, but not limited to, infection, bleeding, blood clot (thrombus formation), and puncture of an artery; nerve damage and irregular heartbeat and possibility to perform a PICC exchange if needed/ordered by physician.  Alternatives to this procedure were also discussed.  Bard Power PICC patient education guide, fact sheet on infection prevention and patient information card has been provided to patient /or left at bedside.    PICC Placement Documentation  PICC Single Lumen 04/05/24 Right Brachial 37 cm 0 cm (Active)  Indication for Insertion or Continuance of Line Prolonged intravenous therapies 04/05/24 1757  Exposed Catheter (cm) 0 cm 04/05/24 1757  Site Assessment Clean, Dry, Intact 04/05/24 1757  Line Status Flushed;Saline locked;Blood return noted 04/05/24 1757  Dressing Type Transparent;Securing device 04/05/24 1757  Dressing Status Antimicrobial disc/dressing in place;Clean, Dry, Intact 04/05/24 1757  Line Care Connections checked and tightened 04/05/24 1757  Line Adjustment (NICU/IV Team Only) No 04/05/24 1757  Dressing Intervention New dressing;Adhesive placed at insertion site (IV team only) 04/05/24 1757  Dressing Change Due 04/12/24 04/05/24 1757       Alexa [name] 04/05/2024, 5:59 PM

## 2024-04-05 NOTE — Evaluation (Signed)
 Physical Therapy Evaluation Patient Details Name: Alexa [name] MRN: [id number] DOB: 09-Nov-1970 Today's Date: 04/05/2024  History of Present Illness  53 year old with  recent meningioma resection 7/29 - 7/30 with Dr. Gillie readmitted on 8/17 with CSF leak from craniotomy site underwent lumbar drain placement on 8/18. Eventually underwent VP shunt placement on 8/24. Admitted with lower back pain along with fevers concerning for spinal meningitis. MRI shows dural enhancement of the lumbar region associated with S2 mass. PMH: HTN, mood disorder.   Clinical Impression  PTA, pt was ambulating with RW with assistance needed from elderly mom and sister at home after recent hospitalizations. Pt has had two recent falls with increased assist needed due to low back pain and weakness. Pt required MinA to stand and ambulate two sets of 13ft with RW. Slow and cautious gait pattern with decreased activity tolerance. Pt has 24/7 supervision at home, however, no physical assist available. At this time, recommending >3hrs post acute rehab to assist with return to prior level of independence and to decrease caregiver burden. Acute PT to follow.         If plan is discharge home, recommend the following: A little help with walking and/or transfers;A little help with bathing/dressing/bathroom;Assist for transportation;Help with stairs or ramp for entrance;Assistance with cooking/housework   Can travel by private vehicle    Yes    Equipment Recommendations BSC/3in1     Functional Status Assessment Patient has had a recent decline in their functional status and demonstrates the ability to make significant improvements in function in a reasonable and predictable amount of time.     Precautions / Restrictions Precautions Precautions: Fall Recall of Precautions/Restrictions: Intact Restrictions Weight Bearing Restrictions Per Provider Order: No      Mobility  Bed Mobility  General bed mobility  comments: OOB in chair    Transfers Overall transfer level: Needs assistance Equipment used: Rolling walker (2 wheels) Transfers: Sit to/from Stand Sit to Stand: Min assist    Ambulation/Gait Ambulation/Gait assistance: Contact guard assist Gait Distance (Feet): 40 Feet (x2) Assistive device: Rolling walker (2 wheels) Gait Pattern/deviations: Step-through pattern, Decreased stride length Gait velocity: decr     General Gait Details: slowed step through gait     Balance Overall balance assessment: Needs assistance Sitting-balance support: No upper extremity supported, Feet supported Sitting balance-Leahy Scale: Good     Standing balance support: No upper extremity supported, During functional activity Standing balance-Leahy Scale: Fair        Pertinent Vitals/Pain Pain Assessment Pain Assessment: Faces Faces Pain Scale: Hurts even more Pain Location: lower back;pain/tingling in feet Pain Descriptors / Indicators: Pins and needles, Aching Pain Intervention(s): Limited activity within patient's tolerance, Monitored during session, Repositioned, RN gave pain meds during session    Home Living Family/patient expects to be discharged to:: Private residence Living Arrangements: Other relatives (lives with Mom and sister) Available Help at Discharge: Family;Available 24 hours/day (supervision only) Type of Home: House Home Access: Ramped entrance       Home Layout: One level Home Equipment: Agricultural consultant (2 wheels);Cane - single point;Rollator (4 wheels);Shower seat;Wheelchair - manual      Prior Function Prior Level of Function : Independent/Modified Independent;Working/employed;Driving    Mobility Comments: Was using the rollator after the surgery in July; increased dizziness  with CSF leaks and was needing it more; started using the RW for at least 1 week PTA ADLs Comments: increased assistance  needed for ADL tasks due increased weakness; was wearing depends due  inability  to get to the bathroom.     Extremity/Trunk Assessment   Upper Extremity Assessment Upper Extremity Assessment: Defer to OT evaluation    Lower Extremity Assessment Lower Extremity Assessment: RLE deficits/detail;LLE deficits/detail RLE Deficits / Details: At least 3/5, deferred MMT due to pain. Alert to light touch, however, reports tingling sensation in R plantar surface of foot LLE Deficits / Details: At least 3/5, deferred MMT due to pain LLE Sensation: WNL    Cervical / Trunk Assessment Cervical / Trunk Assessment: Normal  Communication   Communication Communication: No apparent difficulties    Cognition Arousal: Alert Behavior During Therapy: WFL for tasks assessed/performed   PT - Cognitive impairments: No apparent impairments    Following commands: Intact       Cueing Cueing Techniques: Verbal cues        Exercises General Exercises - Lower Extremity Long Arc Quad: AROM, Both, 5 reps, Seated Hip Flexion/Marching: AROM, Both, 5 reps, Seated   Assessment/Plan    PT Assessment Patient needs continued PT services  PT Problem List Decreased activity tolerance;Decreased strength;Decreased balance;Decreased mobility       PT Treatment Interventions DME instruction;Gait training;Stair training;Functional mobility training;Therapeutic activities;Therapeutic exercise;Balance training;Neuromuscular re-education;Patient/family education    PT Goals (Current goals can be found in the Care Plan section)  Acute Rehab PT Goals Patient Stated Goal: to gain independence and not need assistance at home PT Goal Formulation: With patient Time For Goal Achievement: 04/19/24 Potential to Achieve Goals: Good    Frequency Min 2X/week        AM-PAC PT 6 Clicks Mobility  Outcome Measure Help needed turning from your back to your side while in a flat bed without using bedrails?: None Help needed moving from lying on your back to sitting on the side of a flat  bed without using bedrails?: A Little Help needed moving to and from a bed to a chair (including a wheelchair)?: A Little Help needed standing up from a chair using your arms (e.g., wheelchair or bedside chair)?: A Little Help needed to walk in hospital room?: A Little Help needed climbing 3-5 steps with a railing? : A Little 6 Click Score: 19    End of Session   Activity Tolerance: Patient tolerated treatment well Patient left: in chair;with call bell/phone within reach;with chair alarm set Nurse Communication: Mobility status PT Visit Diagnosis: Other abnormalities of gait and mobility (R26.89);Other symptoms and signs involving the nervous system (R29.898)    Time: 9149-9088 PT Time Calculation (min) (ACUTE ONLY): 21 min   Charges:   PT Evaluation $PT Eval Low Complexity: 1 Low   PT General Charges $$ ACUTE PT VISIT: 1 Visit       Kate ORN, PT, DPT Secure Chat Preferred  Rehab Office [phone]   Kate BRAVO [name] 04/05/2024, 9:22 AM

## 2024-04-05 NOTE — Progress Notes (Signed)
 PROGRESS NOTE    Alexa [name]  FMW:[id number] DOB: [birth year] DOA: 04/01/2024 PCP: Patient, No Pcp Per  Chief Complaint  Patient presents with   Back Pain    Brief Narrative:   53 year old with history of HTN, mood disorder, recent meningioma resection 7/29 - 7/30 with Dr. Gillie readmitted on 8/17 with CSF leak from craniotomy site underwent lumbar drain placement on 8/18. Eventually underwent VP shunt placement on 8/24 with laparoscopic access with Dr. Stevie. Now presents with lower back pain along with fevers concerning for spinal meningitis. MRI shows dural enhancement of the lumbar region associated with S2 mass. Neurosurgery and infectious disease has been consulted.   Assessment & Plan:   Principal Problem:   Meningitis spinal Active Problems:   Sepsis Alexa [name])  Healthcare associated spinal meningitis, in setting of recent neurosurgical procedures  HX recent Meningioma resection 7/29, redo 7/30  Hx recent CSF leak from craniectomy, Lumbar drain 8/18, + VP shunt with laparoscopic placement on 8/24  -MRI lumbar spine consistent with dural enhancement/infection.  CT head and CT abdomen pelvis unremarkable.  Seen by neurosurgery. - s/p LP by IR -> elevated protein, low glucose.  Elevated WBC with 95% neutrophils.  Culture pending.   -- appreciate neurosurgery assistance - Currently on empiric antibiotics.  Consulted ID   Lumbosacral radiculopathy, R foot paresthesia  Some lumbar radiculopathy especially towards right lower extremity.  Neurosurgery team is following. Gabapentin  300 mg TID   ? 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:  -- NSGY may manage and followup on this as well   Reviewed read with rads, it should be reviewed and addended by prior reader (pending)   Asymmetric RLE edema, mild  No evidence DVT in lower extremity    HTN  Continue home Atenolol . Hold home hydrochlorothiazide  I/s/o sepsis.    Mood d/o Has not been taking  bupropion  or Lexapro    Obesity Class I  Body mass index is 31.93 kg/m. Obesity class I would benefit from weight loss outpatient    DVT prophylaxis: SCD Code Status: full Family Communication: none Disposition:   Status is: Inpatient Remains inpatient appropriate because: need for continued inpatient care   Consultants:  Neurology ID  Procedures:  9/2 IMPRESSION: 1. Fluoroscopically-guided L5-S1 lumbar puncture. 2. Opening pressure: 35.5 cm water 3. 13 mL of CSF collected and sent for laboratory studies. 4. No immediate post-procedure complication.  9/1 LP Multiple attempts without access to CSF   Antimicrobials:  Anti-infectives (From admission, onward)    Start     Dose/Rate Route Frequency Ordered Stop   04/02/24 1000  vancomycin  (VANCOCIN ) 1,000 mg in sodium chloride  0.9 % 250 mL IVPB        1,000 mg 250 mL/hr over 60 Minutes Intravenous Every 8 hours 04/02/24 0004     04/02/24 0000  ceFEPIme  (MAXIPIME ) 2 g in sodium chloride  0.9 % 100 mL IVPB        2 g 200 mL/hr over 30 Minutes Intravenous Every 8 hours 04/01/24 2136     04/02/24 0000  vancomycin  (VANCOREADY) IVPB 2000 mg/400 mL        2,000 mg 200 mL/hr over 120 Minutes Intravenous  Once 04/01/24 2242 04/02/24 0245       Subjective: No new complaints Notes back spasm   Objective: Vitals:   04/05/24 0349 04/05/24 0610 04/05/24 0802 04/05/24 1545  BP: 138/81  118/76 97/72  Pulse: 78  79 78  Resp: 17  19 18  Temp: 98.2 F (36.8 C)  97.9 F (36.6 C) 99 F (37.2 C)  TempSrc: Axillary  Oral Oral  SpO2: 98%  100% 100%  Weight:  102.6 kg    Height:        Intake/Output Summary (Last 24 hours) at 04/05/2024 1733 Last data filed at 04/05/2024 1200 Gross per 24 hour  Intake 753 ml  Output 3550 ml  Net -2797 ml   Filed Weights   04/03/24 0500 04/04/24 0535 04/05/24 0610  Weight: 103.7 kg 105.4 kg 102.6 kg    Examination:  General: No acute distress. Sitting up in chair. Cardiovascular:  RRR Lungs: unlabored Abdomen: Soft, nontender, nondistended Neurological: Alert and oriented 3. Moves all extremities 4 with equal strength. Cranial nerves II through XII grossly intact. Extremities: No clubbing or cyanosis. No edema.   Data Reviewed: I have personally reviewed following labs and imaging studies  CBC: Recent Labs  Lab 04/01/24 1544 04/02/24 0442 04/03/24 0331 04/04/24 0258 04/05/24 0313  WBC 8.9 5.0 6.9 9.3 7.1  NEUTROABS 5.6  --   --   --   --   HGB 8.4* 8.2* 8.1* 7.7* 7.7*  HCT 26.4* 25.9* 25.0* 23.4* 23.4*  MCV 88.3 88.7 87.1 85.4 86.3  PLT 559* 473* 398 457* 479*    Basic Metabolic Panel: Recent Labs  Lab 04/01/24 1544 04/02/24 0442 04/03/24 0331 04/04/24 0258 04/05/24 0313  NA 134* 139 134* 133* 135  K 3.5 4.1 4.2 3.8 4.3  CL 98 101 101 100 101  CO2 25 25 23 23 26   GLUCOSE 106* 109* 112* 133* 103*  BUN <5* <5* 10 9 11   CREATININE 0.77 0.75 0.76 0.74 0.78  CALCIUM  9.1 9.2 8.5* 8.6* 8.6*  MG  --  2.0 1.8 1.7 2.0  PHOS  --  3.7 4.3  --   --     GFR: Estimated Creatinine Clearance: 101.9 mL/min (by C-G formula based on SCr of 0.78 mg/dL).  Liver Function Tests: Recent Labs  Lab 04/01/24 1544  AST 13*  ALT 11  ALKPHOS 47  BILITOT 0.2  PROT 7.1  ALBUMIN  3.2*    CBG: No results for input(s): GLUCAP in the last 168 hours.   Recent Results (from the past 240 hours)  Resp panel by RT-PCR (RSV, Flu Alexa [name]&B, Covid) Anterior Nasal Swab     Status: None   Collection Time: 04/01/24  4:04 PM   Specimen: Anterior Nasal Swab  Result Value Ref Range Status   SARS Coronavirus 2 by RT PCR NEGATIVE NEGATIVE Final   Influenza Alexa [name] by PCR NEGATIVE NEGATIVE Final   Influenza B by PCR NEGATIVE NEGATIVE Final    Comment: (NOTE) The Xpert Xpress SARS-CoV-2/FLU/RSV plus assay is intended as an aid in the diagnosis of influenza from Nasopharyngeal swab specimens and should not be used as Alexa [name] sole basis for treatment. Nasal washings and aspirates are  unacceptable for Xpert Xpress SARS-CoV-2/FLU/RSV testing.  Fact Sheet for Patients: BloggerCourse.com  Fact Sheet for Healthcare Providers: SeriousBroker.it  This test is not yet approved or cleared by the [place]  FDA and has been authorized for detection and/or diagnosis of SARS-CoV-2 by FDA under an Emergency Use Authorization (EUA). This EUA will remain in effect (meaning this test can be used) for the duration of the COVID-19 declaration under Section 564(b)(1) of the Act, 21 U.S.C. section 360bbb-3(b)(1), unless the authorization is terminated or revoked.     Resp Syncytial Virus by PCR NEGATIVE NEGATIVE Final    Comment: (NOTE) Fact  Sheet for Patients: BloggerCourse.com  Fact Sheet for Healthcare Providers: SeriousBroker.it  This test is not yet approved or cleared by the [place]  FDA and has been authorized for detection and/or diagnosis of SARS-CoV-2 by FDA under an Emergency Use Authorization (EUA). This EUA will remain in effect (meaning this test can be used) for the duration of the COVID-19 declaration under Section 564(b)(1) of the Act, 21 U.S.C. section 360bbb-3(b)(1), unless the authorization is terminated or revoked.  Performed at [facility], [address] [place], [place] [address]   Blood Culture (routine x 2)     Status: None (Preliminary result)   Collection Time: 04/01/24  4:04 PM   Specimen: BLOOD  Result Value Ref Range Status   Specimen Description BLOOD RIGHT ANTECUBITAL  Final   Special Requests   Final    BOTTLES DRAWN AEROBIC AND ANAEROBIC Blood Culture results may not be optimal due to an inadequate volume of blood received in culture bottles   Culture   Final    NO GROWTH 4 DAYS Performed at [facility], [address], [place], [place] [address]    Report Status PENDING  Incomplete  Blood Culture (routine x 2)      Status: None (Preliminary result)   Collection Time: 04/01/24  4:04 PM   Specimen: BLOOD  Result Value Ref Range Status   Specimen Description BLOOD LEFT ANTECUBITAL  Final   Special Requests   Final    BOTTLES DRAWN AEROBIC AND ANAEROBIC Blood Culture adequate volume   Culture   Final    NO GROWTH 4 DAYS Performed at [facility], [address] [place], [place] [address]    Report Status PENDING  Incomplete  CSF culture w Gram Stain     Status: None (Preliminary result)   Collection Time: 04/02/24  4:35 PM   Specimen: Omran Keelin: PATH Cytology CSF; Cerebrospinal Fluid   B: PATH Cytology CSF; Cerebrospinal Fluid  Result Value Ref Range Status   Specimen Description CSF  Final   Special Requests NONE  Final   Gram Stain   Final    ABUNDANT WBC PRESENT,BOTH PMN AND MONONUCLEAR NO ORGANISMS SEEN    Culture   Final    NO GROWTH 3 DAYS CONTINUING TO HOLD Performed at [facility], [address] [place], [place] [address]    Report Status PENDING  Incomplete         Radiology Studies: US  EKG SITE RITE Result Date: 04/05/2024 If Site Rite image not attached, placement could not be confirmed due to current cardiac rhythm.        Scheduled Meds:  acetaminophen   1,000 mg Oral Q8H   atenolol   50 mg Oral Daily   Chlorhexidine  Gluconate Cloth  6 each Topical Daily   docusate sodium   100 mg Oral BID   gabapentin   300 mg Oral TID   methocarbamol   500 mg Oral TID   polyethylene glycol  17 g Oral BID   sodium chloride  flush  3 mL Intravenous Q12H   Continuous Infusions:  ceFEPime  (MAXIPIME ) IV 2 g (04/05/24 0851)   vancomycin  1,000 mg (04/05/24 1016)     LOS: 3 days    Time spent: over 30 min     Meliton Monte, MD [facility]   To contact the attending provider between 7A-7P or the covering provider during after hours 7P-7A, please log into the web site [email or web address] and access using universal [place] password for that  web site. If you do not  have the password, please call the [name] operator.  04/05/2024, 5:33 PM

## 2024-04-05 NOTE — Progress Notes (Signed)
 Occupational Therapy Treatment Patient Details Name: Alexa [name] MRN: [id number] DOB: [birth year] Today's Date: 04/05/2024   History of present illness 53 year old with  recent meningioma resection 7/29 - 7/30 with Dr. Gillie readmitted on 8/17 with CSF leak from craniotomy site underwent lumbar drain placement on 8/18. Eventually underwent VP shunt placement on 8/24. Admitted with lower back pain along with fevers concerning for spinal meningitis. MRI shows dural enhancement of the lumbar region associated with S2 mass. PMH: HTN, mood disorder.   OT comments  Pt making slow steady progress after months of medical issues. Pt continues to require min to mod assist with most adls at this time and cannot tolerate a lot of activity all at one time.  Pt with pain in back and head today but it did not limit therapy.  Pt is very concerned about her mother's well being taking care of her and would like to do more for herself and is showing that is possible. Feel greater than 3 hours of therapy a day could get pt to a place she needs more of just supervision assist or assist with general home tasks. Will continue to follow with focus on LE adls with AE.      If plan is discharge home, recommend the following:  A little help with walking and/or transfers;A little help with bathing/dressing/bathroom;Assistance with cooking/housework;Assist for transportation;Help with stairs or ramp for entrance   Equipment Recommendations  None recommended by OT    Recommendations for Other Services      Precautions / Restrictions Precautions Precautions: Fall Recall of Precautions/Restrictions: Intact Precaution/Restrictions Comments: SBP 90-160 Restrictions Weight Bearing Restrictions Per Provider Order: No       Mobility Bed Mobility               General bed mobility comments: OOB in chair    Transfers Overall transfer level: Needs assistance Equipment used: Rolling walker (2  wheels) Transfers: Sit to/from Stand Sit to Stand: Contact guard assist           General transfer comment: Pt hand placement good. Pt with some sharp hip pain when standing.     Balance Overall balance assessment: Needs assistance Sitting-balance support: No upper extremity supported, Feet supported Sitting balance-Leahy Scale: Good     Standing balance support: No upper extremity supported, During functional activity Standing balance-Leahy Scale: Fair Standing balance comment: Static standing with walker does not require physical assist. When pt reaching outside of BOS, min assist required.                           ADL either performed or assessed with clinical judgement   ADL Overall ADL's : Needs assistance/impaired Eating/Feeding: Sitting;Independent   Grooming: Wash/dry hands;Wash/dry face;Oral care;Contact guard assist;Standing Grooming Details (indicate cue type and reason): pt stood at sink for appx 5 minutes to groom with CG assist. Pt fatigues quickly and has poor activity tolerance.                 Toilet Transfer: Minimal assistance;Ambulation;BSC/3in1;Regular Toilet;Grab bars;Rolling walker (2 wheels) Toilet Transfer Details (indicate cue type and reason): Pt walked to bathroom with walker to toilet. Pt required assist to get to low toilet.  3:1 raised after toileting task. Toileting- Clothing Manipulation and Hygiene: Minimal assistance;Sit to/from stand       Functional mobility during ADLs: Minimal assistance General ADL Comments: urgency with urination and has difficulty getting to the toilet in time  Extremity/Trunk Assessment Upper Extremity Assessment Upper Extremity Assessment: Defer to OT evaluation   Lower Extremity Assessment Lower Extremity Assessment: RLE deficits/detail;LLE deficits/detail RLE Deficits / Details: At least 3/5, deferred MMT due to pain. Alert to light touch, however, reports tingling sensation in R plantar  surface of foot LLE Deficits / Details: At least 3/5, deferred MMT due to pain LLE Sensation: WNL   Cervical / Trunk Assessment Cervical / Trunk Assessment: Normal    Vision   Vision Assessment?: No apparent visual deficits   Perception     Praxis Praxis Praxis: WFL   Communication Communication Communication: No apparent difficulties   Cognition Arousal: Alert Behavior During Therapy: WFL for tasks assessed/performed Cognition: Cognition impaired   Orientation impairments: Time   Memory impairment (select all impairments): Short-term memory     OT - Cognition Comments: pt reports decreased STM and slow processing. Appears to have decent insight into cogntive limitations                 Following commands: Intact        Cueing   Cueing Techniques: Verbal cues  Exercises      Shoulder Instructions       General Comments Pt continues to require CG to mod assist with most adls. Pt with continued pain and decreased activity tolerance.    Pertinent Vitals/ Pain       Pain Assessment Pain Assessment: 0-10 Pain Score: 4  Pain Location: head Pain Descriptors / Indicators: Aching Pain Intervention(s): Limited activity within patient's tolerance, Monitored during session, Repositioned  Home Living Family/patient expects to be discharged to:: Private residence Living Arrangements: Other relatives (lives with Mom and sister) Available Help at Discharge: Family;Available 24 hours/day (supervision only) Type of Home: House Home Access: Ramped entrance     Home Layout: One level     Bathroom Shower/Tub: Producer, television/film/video: Standard Bathroom Accessibility: Yes   Home Equipment: Agricultural consultant (2 wheels);Cane - single point;Rollator (4 wheels);Shower seat;Wheelchair - manual          Prior Functioning/Environment              Frequency  Min 2X/week        Progress Toward Goals  OT Goals(current goals can now be found in the  care plan section)  Progress towards OT goals: Progressing toward goals  Acute Rehab OT Goals Patient Stated Goal: to take the burden of care off the elderly mother OT Goal Formulation: With patient Time For Goal Achievement: 04/18/24 Potential to Achieve Goals: Good ADL Goals Pt Will Perform Lower Body Bathing: with modified independence;with adaptive equipment;sit to/from stand Pt Will Perform Lower Body Dressing: with modified independence;sit to/from stand;with adaptive equipment Pt Will Transfer to Toilet: with modified independence;ambulating;bedside commode Pt Will Perform Toileting - Clothing Manipulation and hygiene: with modified independence;sit to/from stand;sitting/lateral leans Pt Will Perform Tub/Shower Transfer: Tub transfer;Shower transfer;Independently;ambulating Additional ADL Goal #1: pt will follow 3 step command in prep for ADLs Additional ADL Goal #2: pt will tolerate standing OOB activity x10 min in order to improve activity tolerance for ADLs  Plan      Co-evaluation                 AM-PAC OT 6 Clicks Daily Activity     Outcome Measure   Help from another person eating meals?: None Help from another person taking care of personal grooming?: A Little Help from another person toileting, which includes using toliet, bedpan, or urinal?:  A Little Help from another person bathing (including washing, rinsing, drying)?: A Lot Help from another person to put on and taking off regular upper body clothing?: A Little Help from another person to put on and taking off regular lower body clothing?: A Lot 6 Click Score: 17    End of Session Equipment Utilized During Treatment: Gait belt;Rolling walker (2 wheels)  OT Visit Diagnosis: Unsteadiness on feet (R26.81);Other abnormalities of gait and mobility (R26.89);Muscle weakness (generalized) (M62.81);Pain Pain - part of body:  (head)   Activity Tolerance Patient limited by fatigue   Patient Left in chair;with  call bell/phone within reach   Nurse Communication Mobility status        Time: 8865-8841 OT Time Calculation (min): 24 min  Charges: OT General Charges $OT Visit: 1 Visit OT Treatments $Self Care/Home Management : 23-37 mins   Joshua Silvano Dragon 04/05/2024, 12:09 PM

## 2024-04-05 NOTE — Progress Notes (Addendum)
 Diagnosis: Nosocomial meningitis S/p vp shunt placement    Culture Result: csf cx remains no grow to date   Patient had fever on presentation and lumbar pain with mri meningitis. Cx negative from lp, but glucose very low   Treating empirically for meningitis with planned total 2 weeks abx   If relapse will need to have vp shunt removal   She is on day 5 antibiotics today   OPAT Orders Discharge antibiotics to be given via midline Discharge antibiotics: Vanc cefepime   Per pharmacy protocol  Aim for Vancomycin  trough 15-20 or AUC 400-550 (unless otherwise indicated)   Duration: 2 weeks from 9/1  End Date: 9/14  [facility] Care Per Protocol:  Home health RN for IV administration and teaching; PICC line care and labs.    Labs weekly while on IV antibiotics: _x_ CBC with differential __ BMP _x_ CMP __ CRP __ ESR __ Vancomycin  trough __ CK  _x_ Please pull PIC at completion of IV antibiotics __ Please leave PIC in place until doctor has seen patient or been notified  Fax weekly labs to [phone]  Clinic Follow Up Appt: No need for id clinic f/u  @  [facility] [address], [place], [place] [address] Phone: [phone]    Subjective: Back pain better Up to chairs No fever Cx negative  Exam: Sitting in chair no distress Back brace on  Normal respiratory effort Cn - no focal motor deficit. Alert/oriented; appropriate Skin no rash Ext no edema   Labs: Lab Results  Component Value Date   WBC 7.1 04/05/2024   HGB 7.7 (L) 04/05/2024   HCT 23.4 (L) 04/05/2024   MCV 86.3 04/05/2024   PLT 479 (H) 04/05/2024   Last metabolic panel Lab Results  Component Value Date   GLUCOSE 103 (H) 04/05/2024   NA 135 04/05/2024   K 4.3 04/05/2024   CL 101 04/05/2024   CO2 26 04/05/2024   BUN 11 04/05/2024   CREATININE 0.78 04/05/2024   GFRNONAA >60 04/05/2024   CALCIUM  8.6 (L) 04/05/2024   PHOS 4.3 04/03/2024   PROT 7.1 04/01/2024    ALBUMIN  3.2 (L) 04/01/2024   BILITOT 0.2 04/01/2024   ALKPHOS 47 04/01/2024   AST 13 (L) 04/01/2024   ALT 11 04/01/2024   ANIONGAP 8 04/05/2024    Imaging: Reviewed          Constance ONEIDA Passer, MD [facility]  04/05/2024, 4:21 PM

## 2024-04-05 NOTE — Progress Notes (Signed)
 PHARMACY CONSULT NOTE FOR:  OUTPATIENT  PARENTERAL ANTIBIOTIC THERAPY (OPAT)  Indication: Bacterial meningitis  Regimen:  - Cefepime  2g q8h - vancomycin  1g q8h  End date: 04/16/2024  IV antibiotic discharge orders are pended. To discharging provider:  please sign these orders via discharge navigator,  Select New Orders & click on the button choice - Manage This Unsigned Work.     Thank you for allowing pharmacy to be a part of this patient's care.  Feliciano Close, PharmD PGY2 Infectious Diseases Pharmacy Resident  04/05/2024 1:47 PM

## 2024-04-05 NOTE — Progress Notes (Signed)
 Inpatient Rehab Admissions Coordinator Note:   Per therapy recommendations patient was screened for CIR candidacy by Reche FORBES Lowers, PT. At this time, pt appears to be a potential candidate for CIR. I will place an order for rehab consult for full assessment, per our protocol.  Please contact me any with questions.SABRA Reche Lowers, PT, DPT [phone] 04/05/24 11:32 AM

## 2024-04-05 NOTE — Progress Notes (Signed)
 Inpatient Rehab Admissions:  Inpatient Rehab Consult received.  I met with patient at the bedside for rehabilitation assessment and to discuss goals and expectations of an inpatient rehab admission.  Discussed average length of stay, insurance authorization requirement and discharge home after completion of CIR. Pt acknowledged understanding and is interested in pursuing CIR. Pt gave permission to contact family. Attempted to contact pt's sister Hospital doctor. No one answered. Will f/u at a later date. Will continue to follow.  Signed: Tinnie Yvone Cohens, MS, CCC-SLP Admissions Coordinator [phone]

## 2024-04-06 DIAGNOSIS — G039 Meningitis, unspecified: Secondary | ICD-10-CM | POA: Diagnosis not present

## 2024-04-06 LAB — CBC
HCT: 28 % — ABNORMAL LOW (ref 36.0–46.0)
Hemoglobin: 8.9 g/dL — ABNORMAL LOW (ref 12.0–15.0)
MCH: 27.8 pg (ref 26.0–34.0)
MCHC: 31.8 g/dL (ref 30.0–36.0)
MCV: 87.5 fL (ref 80.0–100.0)
Platelets: 536 K/uL — ABNORMAL HIGH (ref 150–400)
RBC: 3.2 MIL/uL — ABNORMAL LOW (ref 3.87–5.11)
RDW: 12.8 % (ref 11.5–15.5)
WBC: 6.4 K/uL (ref 4.0–10.5)
nRBC: 0 % (ref 0.0–0.2)

## 2024-04-06 LAB — BASIC METABOLIC PANEL WITH GFR
Anion gap: 12 (ref 5–15)
BUN: 7 mg/dL (ref 6–20)
CO2: 24 mmol/L (ref 22–32)
Calcium: 9.3 mg/dL (ref 8.9–10.3)
Chloride: 102 mmol/L (ref 98–111)
Creatinine, Ser: 0.69 mg/dL (ref 0.44–1.00)
GFR, Estimated: >60 mL/min (ref >60)
Glucose, Bld: 107 mg/dL — ABNORMAL HIGH (ref 70–99)
Potassium: 4.4 mmol/L (ref 3.5–5.1)
Sodium: 138 mmol/L (ref 135–145)

## 2024-04-06 LAB — MAGNESIUM: Magnesium: 2.1 mg/dL (ref 1.7–2.4)

## 2024-04-06 LAB — CULTURE, BLOOD (ROUTINE X 2)
Culture: NO GROWTH
Culture: NO GROWTH
Special Requests: ADEQUATE

## 2024-04-06 NOTE — Progress Notes (Signed)
 Patient ID: Alexa [name], female   DOB: Dec 10, 1970, 53 y.o.   MRN: [id number] BP 132/77 (BP Location: Left Arm)   Pulse 77   Temp 99.1 F (37.3 C) (Oral)   Resp 19   Ht 5' 8 (1.727 m)   Wt 102.6 kg   SpO2 99%   BMI 34.39 kg/m  Alert and oriented x 4, speech is clear and fluent.  Perrl, full eom Tongue and uvula midline Symmetric facies.  I turned valve to 60mm H20 Csf collection is much softer.

## 2024-04-06 NOTE — Plan of Care (Signed)
   Problem: Nutrition: Goal: Adequate nutrition will be maintained Outcome: Progressing

## 2024-04-06 NOTE — Progress Notes (Signed)
 Pt movement improving, still has tingling to plantar aspect of Right foot. Pt has had multiple bowel movements overnight, states she is no longer constipated. Pt has had increasing urine frequency and output.  Started measuring urine output @ 0330: Pt urine extremely pale and put out 2400 ml in 3 hours. Pt states she has had high urine output for many months now. Swelling remains to BLE. Pain well controlled and minimal to lower back on tylenol  and robaxin  regimen. Pt denies headache.

## 2024-04-06 NOTE — PMR Pre-admission (Signed)
 PMR Admission Coordinator Pre-Admission Assessment  Patient: Alexa [name] is an 53 y.o., female MRN: [id number] DOB: 1971/07/12 Height: 5' 8 (172.7 cm) Weight: 102.6 kg  Insurance Information HMO:     PPO: yes     PCP:      IPA:      80/20:      OTHER:  PRIMARY: Alexa [name]      Policy#: [id number]      Subscriber: patient CM Name: Alexa [name]      Phone#:      Fax#: [phone] Pre-Cert#: [id number]   Received approval from Alexa [name] with [employer]. Pt   approved from 04/08/24-04/14/24. Review date is 04/15/24.  Employer:  Benefits:  Phone #: [phone]     Name:  Alexa [name]     Deduct: $300 ($300 met)      Out of Pocket Max: $7,900 ($7,900 met)      Life Max: NA CIR: 80% coverage, 20% co-insurance      SNF: 80% coverage, 20% co-insurance Outpatient: 80% coverage     Co-Pay: 20% co-insurance Home Health: 80% coverage      Co-Pay: 20% co-insurance DME: 80% coverage     Co-Pay: 20% co-insurance Providers: in-network SECONDARY:       Policy#:      Phone#:   Artist:       Phone#:   The Data processing manager" for patients in Inpatient Rehabilitation Facilities with attached "Privacy Act Statement-Health Care Records" was provided and verbally reviewed with: Patient  Emergency Contact Information Contact Information     Name Relation Home Work Mobile   Rittenhouse,Barbara Mother   [phone]   Chinita Gauze Sister [phone]  [phone]      Other Contacts   None on File     Current Medical History  Patient Admitting Diagnosis: spinal meningitis History of Present Illness: Pt is a 53 year old female with medical hx significant for: anemia, anxiety, depression, GERD, HTN, meningioma resection  (7/29, 7/30). Pt presented to [facility] on 04/01/24 d/t severe lumbar back pain, fever and new onset right-sided foot paresthesia. Pt recently admitted to hospital on 8/17 with CSF leak. Pt underwent multiple surgeries including lumbar drain  placement (03/18/24), shunt placement (03/24/24). Chest x-ray negative. CT head  and CT abdomen/pelvis negative. 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 LP unsuccessful in ED. Underwent fluoroscopic guided LP on 04/02/24. CSF showed elevated protein, decreased glucose which is consistent with infection. CSF cultures NGTD.  Therapy evaluations completed and CIR recommended d/t pt's deficits in functional mobility.     Patient's medical record from [facility] has been reviewed by the rehabilitation admission coordinator and physician.  Past Medical History  Past Medical History:  Diagnosis Date   Anemia    Anxiety    Depression    GERD (gastroesophageal reflux disease)    Headache    Hypertension    Meningioma (HCC)    PONV (postoperative nausea and vomiting)     Has the patient had major surgery during 100 days prior to admission? Yes  Family History   family history includes Cirrhosis in her paternal grandfather; Diabetes in her father; Heart disease in her mother; Hypertension in her father and mother; Kidney disease in her father; Ovarian cancer in her maternal grandmother; Thyroid  cancer in her maternal aunt; Ulcers in her father.  Current Medications  Current Facility-Administered Medications:    acetaminophen  (TYLENOL ) tablet 1,000 mg, 1,000 mg,  Oral, Q8H, 1,000 mg at 04/08/24 1323 **FOLLOWED BY** [START ON 04/10/2024] acetaminophen  (TYLENOL ) tablet 650 mg, 650 mg, Oral, Q6H PRN, Perri DELENA Meliton Mickey., MD   atenolol  (TENORMIN ) tablet 50 mg, 50 mg, Oral, Daily, Segars, Dorn, MD, 50 mg at 04/08/24 0815   ceFEPIme  (MAXIPIME ) 2 g in sodium chloride  0.9 % 100 mL IVPB, 2 g, Intravenous, Q8H, Segars, Dorn, MD, Last Rate: 200 mL/hr at 04/08/24 0823, 2 g at 04/08/24 9176   Chlorhexidine  Gluconate Cloth 2 % PADS 6 each, 6 each, Topical, Daily, Amin, Ankit C, MD, 6 each at 04/08/24 0818   docusate sodium  (COLACE) capsule  100 mg, 100 mg, Oral, BID, Amin, Ankit C, MD, 100 mg at 04/05/24 0851   gabapentin  (NEURONTIN ) capsule 400 mg, 400 mg, Oral, TID, Perri DELENA Meliton Mickey., MD   glucagon  (human recombinant) [facility]) injection 1 mg, 1 mg, Intravenous, PRN, Amin, Ankit C, MD   guaiFENesin  (ROBITUSSIN) 100 MG/5ML liquid 5 mL, 5 mL, Oral, Q4H PRN, Amin, Ankit C, MD   HYDROmorphone  (DILAUDID ) injection 0.5 mg, 0.5 mg, Intravenous, Q4H PRN, Segars, Jonathan, MD, 0.5 mg at 04/05/24 0608   ipratropium-albuterol  (DUONEB) 0.5-2.5 (3) MG/3ML nebulizer solution 3 mL, 3 mL, Nebulization, Q4H PRN, Amin, Ankit C, MD   melatonin tablet 6 mg, 6 mg, Oral, QHS PRN, Segars, Jonathan, MD   methocarbamol  (ROBAXIN ) tablet 500 mg, 500 mg, Oral, TID, Perri DELENA Meliton Mickey., MD, 500 mg at 04/08/24 9183   ondansetron  (ZOFRAN ) injection 4 mg, 4 mg, Intravenous, Q6H PRN, Segars, Jonathan, MD   Oral care mouth rinse, 15 mL, Mouth Rinse, PRN, Amin, Ankit C, MD   oxyCODONE  (Oxy IR/ROXICODONE ) immediate release tablet 5-10 mg, 5-10 mg, Oral, Q4H PRN, Amin, Ankit C, MD, 10 mg at 04/05/24 0851   polyethylene glycol (MIRALAX  / GLYCOLAX ) packet 17 g, 17 g, Oral, Daily PRN, Segars, Jonathan, MD   polyethylene glycol (MIRALAX  / GLYCOLAX ) packet 17 g, 17 g, Oral, BID, Amin, Ankit C, MD, 17 g at 04/05/24 0852   senna-docusate (Senokot-S) tablet 1 tablet, 1 tablet, Oral, QHS PRN, Amin, Ankit C, MD, 1 tablet at 04/03/24 2117   sodium chloride  flush (NS) 0.9 % injection 10-40 mL, 10-40 mL, Intracatheter, Q12H, Perri DELENA Meliton Mickey., MD, 10 mL at 04/06/24 0901   sodium chloride  flush (NS) 0.9 % injection 10-40 mL, 10-40 mL, Intracatheter, PRN, Perri DELENA Meliton Mickey., MD   sodium chloride  flush (NS) 0.9 % injection 3 mL, 3 mL, Intravenous, Q12H, Segars, Jonathan, MD, 3 mL at 04/08/24 0817   vancomycin  (VANCOCIN ) 1,000 mg in sodium chloride  0.9 % 250 mL IVPB, 1,000 mg, Intravenous, Q8H, Laron Agent, RPH, Last Rate: 250 mL/hr at 04/08/24 1330, 1,000 mg at  04/08/24 1330  Patients Current Diet:  Diet Order             Diet - low sodium heart healthy           Diet regular Room service appropriate? Yes; Fluid consistency: Thin  Diet effective now                   Precautions / Restrictions Precautions Precautions: Fall Precaution/Restrictions Comments: SBP 90-160 Restrictions Weight Bearing Restrictions Per Provider Order: No   Has the patient had 2 or more falls or a fall with injury in the past year? Yes  Prior Activity Level Limited Community (1-2x/wk): MD appointments  Prior Functional Level Self Care: Did the patient need help bathing, dressing, using the toilet or eating? Needed  some help  Indoor Mobility: Did the patient need assistance with walking from room to room (with or without device)? Independent  Stairs: Did the patient need assistance with internal or external stairs (with or without device)? Unknown (avoids stairs)  Functional Cognition: Did the patient need help planning regular tasks such as shopping or remembering to take medications? Independent  Patient Information Are you of Hispanic, Latino/a,or Spanish origin?: A. No, not of Hispanic, Latino/a, or Spanish origin What is your race?: B. Black or African American Do you need or want an interpreter to communicate with a doctor or health care staff?: 0. No  Patient's Response To:  Health Literacy and Transportation Is the patient able to respond to health literacy and transportation needs?: Yes Health Literacy - How often do you need to have someone help you when you read instructions, pamphlets, or other written material from your doctor or pharmacy?: Never In the past 12 months, has lack of transportation kept you from medical appointments or from getting medications?: No In the past 12 months, has lack of transportation kept you from meetings, work, or from getting things needed for daily living?: No  Home Assistive Devices / Equipment Home  Equipment: Agricultural consultant (2 wheels), [employer] - single point, Rollator (4 wheels), Shower seat, Wheelchair - manual  Prior Device Use: Indicate devices/aids used by the patient prior to current illness, exacerbation or injury? Walker  Current Functional Level Cognition  Orientation Level: Oriented X4    Extremity Assessment (includes Sensation/Coordination)  Upper Extremity Assessment: Defer to OT evaluation  Lower Extremity Assessment: RLE deficits/detail, LLE deficits/detail RLE Deficits / Details: At least 3/5, deferred MMT due to pain. Alert to light touch, however, reports tingling sensation in R plantar surface of foot LLE Deficits / Details: At least 3/5, deferred MMT due to pain LLE Sensation: WNL    ADLs  Overall ADL's : Needs assistance/impaired Eating/Feeding: Sitting, Independent Grooming: Wash/dry hands, Wash/dry face, Oral care, Contact guard assist, Standing Grooming Details (indicate cue type and reason): pt stood at sink for appx 5 minutes to groom with CG assist. Pt fatigues quickly and has poor activity tolerance. Upper Body Bathing: Set up, Sitting Lower Body Bathing: Moderate assistance, Sit to/from stand Upper Body Dressing : Set up, Sitting Lower Body Dressing: Moderate assistance, Sit to/from stand Toilet Transfer: Minimal assistance, Ambulation, BSC/3in1, Regular Toilet, Grab bars, Rolling walker (2 wheels) Toilet Transfer Details (indicate cue type and reason): Pt walked to bathroom with walker to toilet. Pt required assist to get to low toilet.  3:1 raised after toileting task. Toileting- Clothing Manipulation and Hygiene: Minimal assistance, Sit to/from stand Functional mobility during ADLs: Minimal assistance General ADL Comments: urgency with urination and has difficulty getting to the toilet in time    Mobility  Overal bed mobility: Needs Assistance Bed Mobility: Supine to Sit Supine to sit: Supervision General bed mobility comments: increased  time/effort    Transfers  Overall transfer level: Needs assistance Equipment used: Rolling walker (2 wheels) Transfers: Sit to/from Stand Sit to Stand: Contact guard assist General transfer comment: from low EOB with CGA for safety    Ambulation / Gait / Stairs / Wheelchair Mobility  Ambulation/Gait Ambulation/Gait assistance: Contact guard assist Gait Distance (Feet): 100 Feet Assistive device: Rolling walker (2 wheels) Gait Pattern/deviations: Step-through pattern, Decreased stride length General Gait Details: slow with low foot clearance with slight improvement with cues. Cues for shoulder depression and adjusted RW for improved posture Gait velocity: decr    Posture /  Balance Dynamic Sitting Balance Sitting balance - Comments: sitting EOB Balance Overall balance assessment: Needs assistance Sitting-balance support: No upper extremity supported, Feet supported Sitting balance-Leahy Scale: Good Sitting balance - Comments: sitting EOB Standing balance support: No upper extremity supported, During functional activity Standing balance-Leahy Scale: Fair Standing balance comment: with RW support    Special considerations/life events  Skin Burn: throat/right; Surgical Incision: abdomen; Surgical Incision: head/right; Surgical Incision: abdomen/left, lower; Surgical Incision: head/left, posterior, lateral and Bladder incontinence   Previous Home Environment (from acute therapy documentation) Living Arrangements: Parent, Other relatives (sister)  Lives With: Family Available Help at Discharge: Family, Available 24 hours/day (supervision only) Type of Home: House Home Layout: One level Home Access: Level entry Bathroom Shower/Tub: Health visitor: Standard (puts BSC over toilet) Bathroom Accessibility: Yes How Accessible: Accessible via walker Home Care Services: Yes Type of Home Care Services: Home OT, Home PT  Discharge Living Setting Plans for Discharge Living  Setting: Patient's home Type of Home at Discharge: House Discharge Home Layout: One level Discharge Home Access: Level entry Discharge Bathroom Shower/Tub: Walk-in shower Discharge Bathroom Toilet: Standard (puts BSC over toilet) Discharge Bathroom Accessibility: Yes How Accessible: Accessible via walker Does the patient have any problems obtaining your medications?: No  Social/Family/Support Systems Anticipated Caregiver: Alexa [name] (sister), Alexa [name] (mother) and Alexa [name] (sister) Anticipated Caregiver's Contact Information: Alexa [name]: [phone], Alexa: [phone] Caregiver Availability: 24/7 Discharge Plan Discussed with Primary Caregiver: Yes Is Caregiver In Agreement with Plan?: Yes Does Caregiver/Family have Issues with Lodging/Transportation while Pt is in Rehab?: No  Goals Patient/Family Goal for Rehab: Mod I-Supervision: PT/OT Expected length of stay: 7-10 days Pt/Family Agrees to Admission and willing to participate: Yes Program Orientation Provided & Reviewed with Pt/Caregiver Including Roles  & Responsibilities: Yes  Decrease burden of Care through IP rehab admission: NA  Possible need for SNF placement upon discharge: Not anticipated  Patient Condition: I have reviewed medical records from [facility], spoken with CM, and patient and family member. I met with patient at the bedside and discussed via phone for inpatient rehabilitation assessment.  Patient will benefit from ongoing PT and OT, can actively participate in 3 hours of therapy a day 5 days of the week, and can make measurable gains during the admission.  Patient will also benefit from the coordinated team approach during an Inpatient Acute Rehabilitation admission.  The patient will receive intensive therapy as well as Rehabilitation physician, nursing, social worker, and care management interventions.  Due to bladder management, safety, skin/wound care, disease management, medication  administration, pain management, and patient education the patient requires 24 hour a day rehabilitation nursing.  The patient is currently CGA with mobility and CGA A-Min A with basic ADLs.  Discharge setting and therapy post discharge at home with home health is anticipated.  Patient has agreed to participate in the Acute Inpatient Rehabilitation Program and will admit today.  Preadmission Screen Completed By:  Tinnie SHAUNNA Yvone Delayne, 04/08/2024 2:45 PM ______________________________________________________________________   Discussed status with Dr. Carilyn on 04/08/24 at 2:45 PM and received approval for admission today.  Admission Coordinator:  Tinnie SHAUNNA Yvone Delayne, CCC-SLP, time 2:45 PM/Date 04/08/24    Assessment/Plan: Diagnosis: Spinal meningitis Does the need for close, 24 hr/day Medical supervision in concert with the patient's rehab needs make it unreasonable for this patient to be served in a less intensive setting? 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 Yes Does the patient require coordinated care of a physician, rehab nurse, PT, OT, and SLP to address physical and functional deficits in the context of the above medical diagnosis(es)? Yes Addressing deficits in the following areas: balance, endurance, locomotion, strength, transferring, bowel/bladder control, bathing, dressing, toileting, cognition, speech, and psychosocial support Can the patient actively participate in an intensive therapy program of at least 3 hrs of therapy 5 days a week? Yes The potential for patient to make measurable gains while on inpatient rehab is good Anticipated functional  outcomes upon discharge from inpatient rehab: supervision PT, supervision OT, supervision SLP Estimated rehab length of stay to reach the above functional goals is: 7-10d Anticipated discharge destination: Home 10. Overall Rehab/Functional Prognosis: good   MD Signature: Prentice CHARLENA Compton M.D. [facility] Fellow Am Acad of Phys Med and Rehab Diplomate Am Board of Electrodiagnostic Med Fellow Am Board of Interventional Pain

## 2024-04-06 NOTE — Progress Notes (Signed)
 PROGRESS NOTE    Alexa [name]  FMW:[id number] DOB: 1970/12/02 DOA: 04/01/2024 PCP: Patient, No Pcp Per  Chief Complaint  Patient presents with   Back Pain    Brief Narrative:   53 year old with history of HTN, mood disorder, recent meningioma resection 7/29 - 7/30 with Dr. Gillie readmitted on 8/17 with CSF leak from craniotomy site underwent lumbar drain placement on 8/18. Eventually underwent VP shunt placement on 8/24 with laparoscopic access with Dr. Stevie. Now presents with lower back pain along with fevers concerning for spinal meningitis. MRI shows dural enhancement of the lumbar region associated with S2 mass. Neurosurgery and infectious disease has been consulted.   Assessment & Plan:   Principal Problem:   Meningitis spinal Active Problems:   Sepsis [facility])  Healthcare associated spinal meningitis, in setting of recent neurosurgical procedures  HX recent Meningioma resection 7/29, redo 7/30  Hx recent CSF leak from craniectomy, Lumbar drain 8/18, + VP shunt with laparoscopic placement on 8/24  -MRI lumbar spine consistent with dural enhancement/infection.  CT head and CT abdomen pelvis unremarkable.  Seen by neurosurgery. - s/p LP by IR -> elevated protein, low glucose.  Elevated WBC with 95% neutrophils.  Culture pending.   -- appreciate neurosurgery assistance - Currently on empiric antibiotics.  Consulted ID - planning 2 weeks empiric treatment for meningitis.    Lumbosacral radiculopathy, R foot paresthesia  Some lumbar radiculopathy especially towards right lower extremity.  Neurosurgery team is following. Gabapentin  300 mg TID   ? 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:  -- NSGY may manage and followup on this as well   Reviewed read with rads, it should be reviewed and addended by prior reader (pending)   Asymmetric RLE edema, mild  No evidence DVT in lower extremity    HTN  Continue home Atenolol . Hold home hydrochlorothiazide   I/s/o sepsis.    Mood d/o Has not been taking bupropion  or Lexapro    Obesity Class I  Body mass index is 31.93 kg/m. Obesity class I would benefit from weight loss outpatient    DVT prophylaxis: SCD Code Status: full Family Communication: none Disposition:   Status is: Inpatient Remains inpatient appropriate because: need for continued inpatient care   Consultants:  Neurology ID  Procedures:  9/2 IMPRESSION: 1. Fluoroscopically-guided L5-S1 lumbar puncture. 2. Opening pressure: 35.5 cm water 3. 13 mL of CSF collected and sent for laboratory studies. 4. No immediate post-procedure complication.  9/1 LP Multiple attempts without access to CSF   Antimicrobials:  Anti-infectives (From admission, onward)    Start     Dose/Rate Route Frequency Ordered Stop   04/02/24 1000  vancomycin  (VANCOCIN ) 1,000 mg in sodium chloride  0.9 % 250 mL IVPB        1,000 mg 250 mL/hr over 60 Minutes Intravenous Every 8 hours 04/02/24 0004     04/02/24 0000  ceFEPIme  (MAXIPIME ) 2 g in sodium chloride  0.9 % 100 mL IVPB        2 g 200 mL/hr over 30 Minutes Intravenous Every 8 hours 04/01/24 2136     04/02/24 0000  vancomycin  (VANCOREADY) IVPB 2000 mg/400 mL        2,000 mg 200 mL/hr over 120 Minutes Intravenous  Once 04/01/24 2242 04/02/24 0245       Subjective: No new complaints Feels Rissie Sculley little better every day  Objective: Vitals:   04/05/24 2335 04/06/24 0409 04/06/24 0743 04/06/24 1226  BP: 126/71 (!) 147/91 132/77 132/80  Pulse: 79 77 77 75  Resp:  18 19 18   Temp: 98.3 F (36.8 C) 98 F (36.7 C) 99.1 F (37.3 C) 98.7 F (37.1 C)  TempSrc: Oral Oral Oral Oral  SpO2: 100% 100% 99% 100%  Weight:      Height:        Intake/Output Summary (Last 24 hours) at 04/06/2024 1710 Last data filed at 04/06/2024 0900 Gross per 24 hour  Intake 360 ml  Output 2850 ml  Net -2490 ml   Filed Weights   04/03/24 0500 04/04/24 0535 04/05/24 0610  Weight: 103.7 kg 105.4 kg 102.6 kg     Examination:  General: No acute distress. Cardiovascular: RRR Lungs: unlabored Neurological: Alert and oriented 3. Moves all extremities 4 with equal strength. Cranial nerves II through XII grossly intact. Extremities: No clubbing or cyanosis. No edema.  Data Reviewed: I have personally reviewed following labs and imaging studies  CBC: Recent Labs  Lab 04/01/24 1544 04/02/24 0442 04/03/24 0331 04/04/24 0258 04/05/24 0313 04/06/24 0322  WBC 8.9 5.0 6.9 9.3 7.1 6.4  NEUTROABS 5.6  --   --   --   --   --   HGB 8.4* 8.2* 8.1* 7.7* 7.7* 8.9*  HCT 26.4* 25.9* 25.0* 23.4* 23.4* 28.0*  MCV 88.3 88.7 87.1 85.4 86.3 87.5  PLT 559* 473* 398 457* 479* 536*    Basic Metabolic Panel: Recent Labs  Lab 04/02/24 0442 04/03/24 0331 04/04/24 0258 04/05/24 0313 04/06/24 0322  NA 139 134* 133* 135 138  K 4.1 4.2 3.8 4.3 4.4  CL 101 101 100 101 102  CO2 25 23 23 26 24   GLUCOSE 109* 112* 133* 103* 107*  BUN <5* 10 9 11 7   CREATININE 0.75 0.76 0.74 0.78 0.69  CALCIUM  9.2 8.5* 8.6* 8.6* 9.3  MG 2.0 1.8 1.7 2.0 2.1  PHOS 3.7 4.3  --   --   --     GFR: Estimated Creatinine Clearance: 101.9 mL/min (by C-G formula based on SCr of 0.69 mg/dL).  Liver Function Tests: Recent Labs  Lab 04/01/24 1544  AST 13*  ALT 11  ALKPHOS 47  BILITOT 0.2  PROT 7.1  ALBUMIN  3.2*    CBG: No results for input(s): GLUCAP in the last 168 hours.   Recent Results (from the past 240 hours)  Resp panel by RT-PCR (RSV, Flu Kolbie Lepkowski&B, Covid) Anterior Nasal Swab     Status: None   Collection Time: 04/01/24  4:04 PM   Specimen: Anterior Nasal Swab  Result Value Ref Range Status   SARS Coronavirus 2 by RT PCR NEGATIVE NEGATIVE Final   Influenza Congetta Odriscoll by PCR NEGATIVE NEGATIVE Final   Influenza B by PCR NEGATIVE NEGATIVE Final    Comment: (NOTE) The Xpert Xpress SARS-CoV-2/FLU/RSV plus assay is intended as an aid in the diagnosis of influenza from Nasopharyngeal swab specimens and should not be used as  Khalea Ventura sole basis for treatment. Nasal washings and aspirates are unacceptable for Xpert Xpress SARS-CoV-2/FLU/RSV testing.  Fact Sheet for Patients: BloggerCourse.com  Fact Sheet for Healthcare Providers: SeriousBroker.it  This test is not yet approved or cleared by the [place]  FDA and has been authorized for detection and/or diagnosis of SARS-CoV-2 by FDA under an Emergency Use Authorization (EUA). This EUA will remain in effect (meaning this test can be used) for the duration of the COVID-19 declaration under Section 564(b)(1) of the Act, 21 U.S.C. section 360bbb-3(b)(1), unless the authorization is terminated or revoked.     Resp  Syncytial Virus by PCR NEGATIVE NEGATIVE Final    Comment: (NOTE) Fact Sheet for Patients: BloggerCourse.com  Fact Sheet for Healthcare Providers: SeriousBroker.it  This test is not yet approved or cleared by the [place]  FDA and has been authorized for detection and/or diagnosis of SARS-CoV-2 by FDA under an Emergency Use Authorization (EUA). This EUA will remain in effect (meaning this test can be used) for the duration of the COVID-19 declaration under Section 564(b)(1) of the Act, 21 U.S.C. section 360bbb-3(b)(1), unless the authorization is terminated or revoked.  Performed at [facility], [address] [place], [place] [address]   Blood Culture (routine x 2)     Status: None   Collection Time: 04/01/24  4:04 PM   Specimen: BLOOD  Result Value Ref Range Status   Specimen Description BLOOD RIGHT ANTECUBITAL  Final   Special Requests   Final    BOTTLES DRAWN AEROBIC AND ANAEROBIC Blood Culture results may not be optimal due to an inadequate volume of blood received in culture bottles   Culture   Final    NO GROWTH 5 DAYS Performed at [facility], [address], [place], [place] [address]    Report Status 04/06/2024  FINAL  Final  Blood Culture (routine x 2)     Status: None   Collection Time: 04/01/24  4:04 PM   Specimen: BLOOD  Result Value Ref Range Status   Specimen Description BLOOD LEFT ANTECUBITAL  Final   Special Requests   Final    BOTTLES DRAWN AEROBIC AND ANAEROBIC Blood Culture adequate volume   Culture   Final    NO GROWTH 5 DAYS Performed at [facility], [address], [place], [place] [address]    Report Status 04/06/2024 FINAL  Final  CSF culture w Gram Stain     Status: None (Preliminary result)   Collection Time: 04/02/24  4:35 PM   Specimen: Harriett Azar: PATH Cytology CSF; Cerebrospinal Fluid   B: PATH Cytology CSF; Cerebrospinal Fluid  Result Value Ref Range Status   Specimen Description CSF  Final   Special Requests NONE  Final   Gram Stain   Final    ABUNDANT WBC PRESENT,BOTH PMN AND MONONUCLEAR NO ORGANISMS SEEN    Culture   Final    NO GROWTH 4 DAYS CONTINUING TO HOLD Performed at [facility], [address], [place], [place] [address]    Report Status PENDING  Incomplete         Radiology Studies: US  EKG SITE RITE Result Date: 04/05/2024 If Site Rite image not attached, placement could not be confirmed due to current cardiac rhythm.        Scheduled Meds:  acetaminophen   1,000 mg Oral Q8H   atenolol   50 mg Oral Daily   Chlorhexidine  Gluconate Cloth  6 each Topical Daily   docusate sodium   100 mg Oral BID   gabapentin   300 mg Oral TID   methocarbamol   500 mg Oral TID   polyethylene glycol  17 g Oral BID   sodium chloride  flush  10-40 mL Intracatheter Q12H   sodium chloride  flush  3 mL Intravenous Q12H   Continuous Infusions:  ceFEPime  (MAXIPIME ) IV 2 g (04/06/24 1528)   vancomycin  1,000 mg (04/06/24 0957)     LOS: 4 days    Time spent: over 30 min     Meliton Monte, MD [facility]   To contact the attending provider between 7A-7P or the covering  provider during after hours 7P-7A, please log into the web site [email or web address]  and access using universal Marie password for that web site. If you do not have the password, please call the hospital operator.  04/06/2024, 5:10 PM

## 2024-04-06 NOTE — Progress Notes (Addendum)
 Inpatient Rehab Admissions Coordinator:  Attempted to contact pt's sister Hospital doctor. No one answered and unable to leave a message. Also attempted to contact pt's mother Heron. Left a message. Awaiting return call. Will continue to follow.  1025: Pt's sister Hospital doctor returned call. Discussed CIR goals and expectations. She acknowledged understanding. She is supportive of pt pursuing CIR. She confirmed family will bel able to provide 24/7 support for pt after discharge.   Insurance authorization started.  Tinnie Yvone Cohens, MS, CCC-SLP Admissions Coordinator [phone]

## 2024-04-07 DIAGNOSIS — G039 Meningitis, unspecified: Secondary | ICD-10-CM | POA: Diagnosis not present

## 2024-04-07 LAB — BASIC METABOLIC PANEL WITH GFR
Anion gap: 10 (ref 5–15)
BUN: 10 mg/dL (ref 6–20)
CO2: 24 mmol/L (ref 22–32)
Calcium: 8.8 mg/dL — ABNORMAL LOW (ref 8.9–10.3)
Chloride: 102 mmol/L (ref 98–111)
Creatinine, Ser: 0.76 mg/dL (ref 0.44–1.00)
GFR, Estimated: >60 mL/min (ref >60)
Glucose, Bld: 102 mg/dL — ABNORMAL HIGH (ref 70–99)
Potassium: 3.5 mmol/L (ref 3.5–5.1)
Sodium: 136 mmol/L (ref 135–145)

## 2024-04-07 LAB — CBC
HCT: 22.8 % — ABNORMAL LOW (ref 36.0–46.0)
Hemoglobin: 7.5 g/dL — ABNORMAL LOW (ref 12.0–15.0)
MCH: 28.4 pg (ref 26.0–34.0)
MCHC: 32.9 g/dL (ref 30.0–36.0)
MCV: 86.4 fL (ref 80.0–100.0)
Platelets: 443 K/uL — ABNORMAL HIGH (ref 150–400)
RBC: 2.64 MIL/uL — ABNORMAL LOW (ref 3.87–5.11)
RDW: 12.8 % (ref 11.5–15.5)
WBC: 5.5 K/uL (ref 4.0–10.5)
nRBC: 0 % (ref 0.0–0.2)

## 2024-04-07 LAB — MAGNESIUM: Magnesium: 2 mg/dL (ref 1.7–2.4)

## 2024-04-07 NOTE — Progress Notes (Signed)
 Patient ID: Alexa [name], female   DOB: [birth year], 53 y.o.   MRN: [id number] Patient is awake and alert she is not complaining of any significant headaches or back pain.  Radicular pain seems to be under control.  The fluid mass in the posterior fossa on the left side appears to be somewhat boggy.  She does feel that things are improving.  Pressure was adjusted yesterday to 60 mm of water.  Continue to monitor her clinically.

## 2024-04-07 NOTE — Plan of Care (Signed)
   Problem: Health Behavior/Discharge Planning: Goal: Ability to manage health-related needs will improve Outcome: Progressing

## 2024-04-07 NOTE — Progress Notes (Signed)
 PROGRESS NOTE    Alexa [name]  FMW:[id number] DOB: 1971-02-23 DOA: 04/01/2024 PCP: Patient, No Pcp Per  Chief Complaint  Patient presents with   Back Pain    Brief Narrative:   53 year old with history of HTN, mood disorder, recent meningioma resection 7/29 - 7/30 with Dr. Gillie readmitted on 8/17 with CSF leak from craniotomy site underwent lumbar drain placement on 8/18. Eventually underwent VP shunt placement on 8/24 with laparoscopic access with Dr. Stevie. Now presents with lower back pain along with fevers concerning for spinal meningitis. MRI shows dural enhancement of the lumbar region associated with S2 mass. Neurosurgery and infectious disease has been consulted.   Assessment & Plan:   Principal Problem:   Meningitis spinal Active Problems:   Sepsis [facility])  Healthcare associated spinal meningitis, in setting of recent neurosurgical procedures  HX recent Meningioma resection 7/29, redo 7/30  Hx recent CSF leak from craniectomy, Lumbar drain 8/18, + VP shunt with laparoscopic placement on 8/24  -MRI lumbar spine consistent with dural enhancement/infection.  CT head and CT abdomen pelvis unremarkable.  Seen by neurosurgery. - s/p LP by IR -> elevated protein, low glucose.  Elevated WBC with 95% neutrophils.  Culture pending.   -- appreciate neurosurgery assistance - Currently on empiric antibiotics.  Consulted ID - planning 2 weeks empiric treatment for meningitis.    Lumbosacral radiculopathy, R foot paresthesia  Some lumbar radiculopathy especially towards right lower extremity.  Neurosurgery team is following. Gabapentin  300 mg TID   ? 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:  -- NSGY may manage and followup on this as well   Reviewed read with rads, it should be reviewed and addended by prior reader (pending)   Asymmetric RLE edema, mild  No evidence DVT in lower extremity    HTN  Continue home Atenolol . Hold home hydrochlorothiazide   I/s/o sepsis.    Mood d/o Has not been taking bupropion  or Lexapro    Obesity Class I  Body mass index is 31.93 kg/m. Obesity class I would benefit from weight loss outpatient    DVT prophylaxis: SCD Code Status: full Family Communication: none Disposition:   Status is: Inpatient Remains inpatient appropriate because: need for continued inpatient care   Consultants:  Neurology ID  Procedures:  9/2 IMPRESSION: 1. Fluoroscopically-guided L5-S1 lumbar puncture. 2. Opening pressure: 35.5 cm water 3. 13 mL of CSF collected and sent for laboratory studies. 4. No immediate post-procedure complication.  9/1 LP Multiple attempts without access to CSF   Antimicrobials:  Anti-infectives (From admission, onward)    Start     Dose/Rate Route Frequency Ordered Stop   04/02/24 1000  vancomycin  (VANCOCIN ) 1,000 mg in sodium chloride  0.9 % 250 mL IVPB        1,000 mg 250 mL/hr over 60 Minutes Intravenous Every 8 hours 04/02/24 0004     04/02/24 0000  ceFEPIme  (MAXIPIME ) 2 g in sodium chloride  0.9 % 100 mL IVPB        2 g 200 mL/hr over 30 Minutes Intravenous Every 8 hours 04/01/24 2136     04/02/24 0000  vancomycin  (VANCOREADY) IVPB 2000 mg/400 mL        2,000 mg 200 mL/hr over 120 Minutes Intravenous  Once 04/01/24 2242 04/02/24 0245       Subjective: Feels slightly better daily   Objective: Vitals:   04/07/24 0006 04/07/24 0319 04/07/24 0807 04/07/24 1139  BP: 111/72 (!) 141/81 139/82 118/74  Pulse: 75 77  77 76  Resp: 18 18 16 14   Temp: 98.3 F (36.8 C) 98.7 F (37.1 C) 98.9 F (37.2 C) 98 F (36.7 C)  TempSrc:   Oral Oral  SpO2: 97% 100% 100% 100%  Weight:      Height:        Intake/Output Summary (Last 24 hours) at 04/07/2024 1345 Last data filed at 04/06/2024 1944 Gross per 24 hour  Intake --  Output 400 ml  Net -400 ml   Filed Weights   04/03/24 0500 04/04/24 0535 04/05/24 0610  Weight: 103.7 kg 105.4 kg 102.6 kg    Examination:  General: No acute  distress. Lungs: unlabored Neurological: Alert and oriented 3. Moves all extremities 4 . Cranial nerves II through XII grossly intact. Extremities: No clubbing or cyanosis. No edema.  Data Reviewed: I have personally reviewed following labs and imaging studies  CBC: Recent Labs  Lab 04/01/24 1544 04/02/24 0442 04/03/24 0331 04/04/24 0258 04/05/24 0313 04/06/24 0322 04/07/24 0400  WBC 8.9   < > 6.9 9.3 7.1 6.4 5.5  NEUTROABS 5.6  --   --   --   --   --   --   HGB 8.4*   < > 8.1* 7.7* 7.7* 8.9* 7.5*  HCT 26.4*   < > 25.0* 23.4* 23.4* 28.0* 22.8*  MCV 88.3   < > 87.1 85.4 86.3 87.5 86.4  PLT 559*   < > 398 457* 479* 536* 443*   < > = values in this interval not displayed.    Basic Metabolic Panel: Recent Labs  Lab 04/02/24 0442 04/03/24 0331 04/04/24 0258 04/05/24 0313 04/06/24 0322 04/07/24 0400  NA 139 134* 133* 135 138 136  K 4.1 4.2 3.8 4.3 4.4 3.5  CL 101 101 100 101 102 102  CO2 25 23 23 26 24 24   GLUCOSE 109* 112* 133* 103* 107* 102*  BUN <5* 10 9 11 7 10   CREATININE 0.75 0.76 0.74 0.78 0.69 0.76  CALCIUM  9.2 8.5* 8.6* 8.6* 9.3 8.8*  MG 2.0 1.8 1.7 2.0 2.1 2.0  PHOS 3.7 4.3  --   --   --   --     GFR: Estimated Creatinine Clearance: 101.9 mL/min (by C-G formula based on SCr of 0.76 mg/dL).  Liver Function Tests: Recent Labs  Lab 04/01/24 1544  AST 13*  ALT 11  ALKPHOS 47  BILITOT 0.2  PROT 7.1  ALBUMIN  3.2*    CBG: No results for input(s): GLUCAP in the last 168 hours.   Recent Results (from the past 240 hours)  Resp panel by RT-PCR (RSV, Flu Lakeitha Basques&B, Covid) Anterior Nasal Swab     Status: None   Collection Time: 04/01/24  4:04 PM   Specimen: Anterior Nasal Swab  Result Value Ref Range Status   SARS Coronavirus 2 by RT PCR NEGATIVE NEGATIVE Final   Influenza Torin Modica by PCR NEGATIVE NEGATIVE Final   Influenza B by PCR NEGATIVE NEGATIVE Final    Comment: (NOTE) The Xpert Xpress SARS-CoV-2/FLU/RSV plus assay is intended as an aid in the diagnosis  of influenza from Nasopharyngeal swab specimens and should not be used as Naylah Cork sole basis for treatment. Nasal washings and aspirates are unacceptable for Xpert Xpress SARS-CoV-2/FLU/RSV testing.  Fact Sheet for Patients: BloggerCourse.com  Fact Sheet for Healthcare Providers: SeriousBroker.it  This test is not yet approved or cleared by the [place]  FDA and has been authorized for detection and/or diagnosis of SARS-CoV-2 by FDA under an Emergency Use Authorization (  EUA). This EUA will remain in effect (meaning this test can be used) for the duration of the COVID-19 declaration under Section 564(b)(1) of the Act, 21 U.S.C. section 360bbb-3(b)(1), unless the authorization is terminated or revoked.     Resp Syncytial Virus by PCR NEGATIVE NEGATIVE Final    Comment: (NOTE) Fact Sheet for Patients: BloggerCourse.com  Fact Sheet for Healthcare Providers: SeriousBroker.it  This test is not yet approved or cleared by the [place]  FDA and has been authorized for detection and/or diagnosis of SARS-CoV-2 by FDA under an Emergency Use Authorization (EUA). This EUA will remain in effect (meaning this test can be used) for the duration of the COVID-19 declaration under Section 564(b)(1) of the Act, 21 U.S.C. section 360bbb-3(b)(1), unless the authorization is terminated or revoked.  Performed at [facility], [address], [place], [place] [address]   Blood Culture (routine x 2)     Status: None   Collection Time: 04/01/24  4:04 PM   Specimen: BLOOD  Result Value Ref Range Status   Specimen Description BLOOD RIGHT ANTECUBITAL  Final   Special Requests   Final    BOTTLES DRAWN AEROBIC AND ANAEROBIC Blood Culture results may not be optimal due to an inadequate volume of blood received in culture bottles   Culture   Final    NO GROWTH 5 DAYS Performed at [facility], [address] [place], [place] [address]    Report Status 04/06/2024 FINAL  Final  Blood Culture (routine x 2)     Status: None   Collection Time: 04/01/24  4:04 PM   Specimen: BLOOD  Result Value Ref Range Status   Specimen Description BLOOD LEFT ANTECUBITAL  Final   Special Requests   Final    BOTTLES DRAWN AEROBIC AND ANAEROBIC Blood Culture adequate volume   Culture   Final    NO GROWTH 5 DAYS Performed at [facility], [address] [place], [place] [address]    Report Status 04/06/2024 FINAL  Final  CSF culture w Gram Stain     Status: None (Preliminary result)   Collection Time: 04/02/24  4:35 PM   Specimen: Anuel Sitter: PATH Cytology CSF; Cerebrospinal Fluid   B: PATH Cytology CSF; Cerebrospinal Fluid  Result Value Ref Range Status   Specimen Description CSF  Final   Special Requests NONE  Final   Gram Stain   Final    ABUNDANT WBC PRESENT,BOTH PMN AND MONONUCLEAR NO ORGANISMS SEEN    Culture   Final    NO GROWTH 5 DAYS CONTINUING TO HOLD Performed at [facility], [address] [place], [place] [address]    Report Status PENDING  Incomplete         Radiology Studies: US  EKG SITE RITE Result Date: 04/05/2024 If Site Rite image not attached, placement could not be confirmed due to current cardiac rhythm.        Scheduled Meds:  acetaminophen   1,000 mg Oral Q8H   atenolol   50 mg Oral Daily   Chlorhexidine  Gluconate Cloth  6 each Topical Daily   docusate sodium   100 mg Oral BID   gabapentin   300 mg Oral TID   methocarbamol   500 mg Oral TID   polyethylene glycol  17 g Oral BID   sodium chloride  flush  10-40 mL Intracatheter Q12H   sodium chloride  flush  3 mL Intravenous Q12H   Continuous Infusions:  ceFEPime  (MAXIPIME ) IV 2 g (04/07/24 0913)  vancomycin  1,000 mg (04/07/24 1017)     LOS: 5 days    Time spent: over 30 min     Meliton Monte, MD [facility]   To contact the attending provider between 7A-7P or the covering  provider during after hours 7P-7A, please log into the web site [email or web address] and access using universal Avery password for that web site. If you do not have the password, please call the hospital operator.  04/07/2024, 1:45 PM

## 2024-04-08 ENCOUNTER — Inpatient Hospital Stay (HOSPITAL_COMMUNITY)
Admission: AD | Admit: 2024-04-08 | Discharge: 2024-04-15 | DRG: 945 | Disposition: A | Discharge status: Home / Self Care | Source: Intra-hospital | Attending: Physical Medicine and Rehabilitation | Admitting: Physical Medicine and Rehabilitation

## 2024-04-08 ENCOUNTER — Other Ambulatory Visit: Payer: Self-pay

## 2024-04-08 DIAGNOSIS — R739 Hyperglycemia, unspecified: Secondary | ICD-10-CM | POA: Diagnosis present

## 2024-04-08 DIAGNOSIS — D329 Benign neoplasm of meninges, unspecified: Secondary | ICD-10-CM

## 2024-04-08 DIAGNOSIS — G039 Meningitis, unspecified: Secondary | ICD-10-CM | POA: Diagnosis not present

## 2024-04-08 DIAGNOSIS — M5417 Radiculopathy, lumbosacral region: Secondary | ICD-10-CM | POA: Diagnosis present

## 2024-04-08 DIAGNOSIS — E876 Hypokalemia: Secondary | ICD-10-CM | POA: Diagnosis not present

## 2024-04-08 DIAGNOSIS — Z982 Presence of cerebrospinal fluid drainage device: Secondary | ICD-10-CM

## 2024-04-08 DIAGNOSIS — G9611 Dural tear: Secondary | ICD-10-CM | POA: Diagnosis not present

## 2024-04-08 DIAGNOSIS — I10 Essential (primary) hypertension: Secondary | ICD-10-CM | POA: Diagnosis not present

## 2024-04-08 DIAGNOSIS — M160 Bilateral primary osteoarthritis of hip: Secondary | ICD-10-CM | POA: Diagnosis present

## 2024-04-08 DIAGNOSIS — N3281 Overactive bladder: Secondary | ICD-10-CM | POA: Diagnosis present

## 2024-04-08 DIAGNOSIS — D62 Acute posthemorrhagic anemia: Secondary | ICD-10-CM | POA: Diagnosis present

## 2024-04-08 DIAGNOSIS — F39 Unspecified mood [affective] disorder: Secondary | ICD-10-CM | POA: Diagnosis not present

## 2024-04-08 DIAGNOSIS — E66811 Obesity, class 1: Secondary | ICD-10-CM | POA: Diagnosis present

## 2024-04-08 DIAGNOSIS — Z833 Family history of diabetes mellitus: Secondary | ICD-10-CM | POA: Diagnosis not present

## 2024-04-08 DIAGNOSIS — Z9889 Other specified postprocedural states: Secondary | ICD-10-CM

## 2024-04-08 DIAGNOSIS — Z6834 Body mass index (BMI) 34.0-34.9, adult: Secondary | ICD-10-CM

## 2024-04-08 DIAGNOSIS — R5381 Other malaise: Principal | ICD-10-CM | POA: Diagnosis present

## 2024-04-08 DIAGNOSIS — R197 Diarrhea, unspecified: Secondary | ICD-10-CM | POA: Diagnosis not present

## 2024-04-08 DIAGNOSIS — Z8249 Family history of ischemic heart disease and other diseases of the circulatory system: Secondary | ICD-10-CM | POA: Diagnosis not present

## 2024-04-08 DIAGNOSIS — D709 Neutropenia, unspecified: Secondary | ICD-10-CM | POA: Diagnosis not present

## 2024-04-08 DIAGNOSIS — Z79899 Other long term (current) drug therapy: Secondary | ICD-10-CM

## 2024-04-08 DIAGNOSIS — R202 Paresthesia of skin: Secondary | ICD-10-CM | POA: Insufficient documentation

## 2024-04-08 DIAGNOSIS — M25552 Pain in left hip: Secondary | ICD-10-CM | POA: Diagnosis not present

## 2024-04-08 LAB — BASIC METABOLIC PANEL WITH GFR
Anion gap: 8 (ref 5–15)
BUN: <5 mg/dL — ABNORMAL LOW (ref 6–20)
CO2: 24 mmol/L (ref 22–32)
Calcium: 9.1 mg/dL (ref 8.9–10.3)
Chloride: 106 mmol/L (ref 98–111)
Creatinine, Ser: 0.63 mg/dL (ref 0.44–1.00)
GFR, Estimated: >60 mL/min (ref >60)
Glucose, Bld: 130 mg/dL — ABNORMAL HIGH (ref 70–99)
Potassium: 3.8 mmol/L (ref 3.5–5.1)
Sodium: 138 mmol/L (ref 135–145)

## 2024-04-08 LAB — CBC
HCT: 26.5 % — ABNORMAL LOW (ref 36.0–46.0)
Hemoglobin: 8.5 g/dL — ABNORMAL LOW (ref 12.0–15.0)
MCH: 27.8 pg (ref 26.0–34.0)
MCHC: 32.1 g/dL (ref 30.0–36.0)
MCV: 86.6 fL (ref 80.0–100.0)
Platelets: 502 K/uL — ABNORMAL HIGH (ref 150–400)
RBC: 3.06 MIL/uL — ABNORMAL LOW (ref 3.87–5.11)
RDW: 13 % (ref 11.5–15.5)
WBC: 4.8 K/uL (ref 4.0–10.5)
nRBC: 0 % (ref 0.0–0.2)

## 2024-04-08 LAB — MAGNESIUM: Magnesium: 2.1 mg/dL (ref 1.7–2.4)

## 2024-04-08 MED ORDER — CHLORHEXIDINE GLUCONATE CLOTH 2 % EX PADS
6.0000 | MEDICATED_PAD | Freq: Every day | CUTANEOUS | Status: DC
  Administered 2024-04-09: 6 via TOPICAL

## 2024-04-08 MED ORDER — SODIUM CHLORIDE 0.9 % IV SOLN
2.0000 g | Freq: Three times a day (TID) | INTRAVENOUS | Status: DC
  Administered 2024-04-08 – 2024-04-14 (×19): 2 g via INTRAVENOUS
  Filled 2024-04-08 (×20): qty 12.5

## 2024-04-08 MED ORDER — CEFEPIME IV (FOR PTA / DISCHARGE USE ONLY): 2.0000 g | Freq: Three times a day (TID) | INTRAVENOUS | 0 refills | Status: DC

## 2024-04-08 MED ORDER — OXYCODONE HCL 5 MG PO TABS: 5.0000 mg | ORAL_TABLET | Freq: Four times a day (QID) | ORAL | Status: DC | PRN

## 2024-04-08 MED ORDER — MELATONIN 3 MG PO TABS: 6.0000 mg | ORAL_TABLET | Freq: Every evening | ORAL | Status: DC | PRN

## 2024-04-08 MED ORDER — METHOCARBAMOL 500 MG PO TABS: 500.0000 mg | ORAL_TABLET | Freq: Three times a day (TID) | ORAL | Status: DC | PRN

## 2024-04-08 MED ORDER — METHOCARBAMOL 500 MG PO TABS
500.0000 mg | ORAL_TABLET | Freq: Three times a day (TID) | ORAL | Status: DC
Start: 2024-04-08 — End: 2024-04-15
  Administered 2024-04-08 – 2024-04-15 (×21): 500 mg via ORAL
  Filled 2024-04-08 (×21): qty 1

## 2024-04-08 MED ORDER — VANCOMYCIN IV (FOR PTA / DISCHARGE USE ONLY): 1000.0000 mg | Freq: Three times a day (TID) | INTRAVENOUS | 0 refills | Status: DC

## 2024-04-08 MED ORDER — ATENOLOL 50 MG PO TABS
50.0000 mg | ORAL_TABLET | Freq: Every day | ORAL | Status: DC
Start: 2024-04-09 — End: 2024-04-15
  Administered 2024-04-09 – 2024-04-15 (×7): 50 mg via ORAL
  Filled 2024-04-08 (×7): qty 1

## 2024-04-08 MED ORDER — VANCOMYCIN HCL IN DEXTROSE 1-5 GM/200ML-% IV SOLN
1000.0000 mg | Freq: Three times a day (TID) | INTRAVENOUS | Status: DC
  Administered 2024-04-09 – 2024-04-14 (×18): 1000 mg via INTRAVENOUS
  Filled 2024-04-08 (×21): qty 200

## 2024-04-08 MED ORDER — GABAPENTIN 400 MG PO CAPS: 400.0000 mg | ORAL_CAPSULE | Freq: Three times a day (TID) | ORAL | Status: DC

## 2024-04-08 MED ORDER — POLYETHYLENE GLYCOL 3350 17 G PO PACK: 17.0000 g | PACK | Freq: Every day | ORAL | Status: DC | PRN

## 2024-04-08 MED ORDER — GABAPENTIN 400 MG PO CAPS
400.0000 mg | ORAL_CAPSULE | Freq: Three times a day (TID) | ORAL | Status: DC
  Administered 2024-04-08 – 2024-04-15 (×21): 400 mg via ORAL
  Filled 2024-04-08 (×21): qty 1

## 2024-04-08 MED ORDER — OXYCODONE HCL 5 MG PO TABS
5.0000 mg | ORAL_TABLET | ORAL | Status: DC | PRN
  Administered 2024-04-12: 5 mg via ORAL
  Filled 2024-04-08: qty 1

## 2024-04-08 MED ORDER — IPRATROPIUM-ALBUTEROL 0.5-2.5 (3) MG/3ML IN SOLN: 3.0000 mL | RESPIRATORY_TRACT | Status: DC | PRN

## 2024-04-08 MED ORDER — GABAPENTIN 400 MG PO CAPS
400.0000 mg | ORAL_CAPSULE | Freq: Three times a day (TID) | ORAL | Status: DC
  Administered 2024-04-08: 400 mg via ORAL
  Filled 2024-04-08: qty 1

## 2024-04-08 NOTE — Progress Notes (Signed)
 Inpatient Rehab Admissions Coordinator:  Saw pt at bedside. Continue to await insurance authorization. Will continue to follow.   Tinnie Yvone Cohens, MS, CCC-SLP Admissions Coordinator [phone]

## 2024-04-08 NOTE — Progress Notes (Signed)
 Inpatient Rehab Admissions Coordinator:  Received insurance approval. There is a bed available for pt in CIR today. Dr. Perri aware and in agreement. Pt, NSG and TOC made aware.   Tinnie Yvone Cohens, MS, CCC-SLP Admissions Coordinator [phone]
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
## 2024-04-08 NOTE — H&P (Signed)
 Physical Medicine and Rehabilitation Admission H&P        Chief Complaint  Patient presents with   Functional deficits due to meningitis.       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  MRI lumbar spine showed a 10 mm X 11 mm  X 26 mm heterogeneously enhancing mass posterior to S2 vertebral body c/w appendicitis and ill defined mass lower lumbar spine and dural enhancement along lower cord concerning for infection. Dr. Vince felt ill defined dural enhancement concerning for meningitis.  LP done revealing WBC 1040,  segmented neurophils, glucose < 20 and TP 563. She was started on Vancomycin  and Cefepime  on 09/01 with Dr. Overton recommending 2 week course antibiotics for meningitis.  Gram stain negative and cultures pending. RLE noted to be edematous and dopplers done was negative for DVT in RLE.    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   CT A/P showed known enhancing mass posterior to S2 not well appreciated and VPS extending to RLQ with no significant free fluid.  She continued to have significant pain in lower back and BLE as well as significant amount of fluid in wound and shunt has been dialed down to 60 mmH2O. Pain is improving but continues to have limitations in activity tolerance. She continues to have tingling right foot and very as high UOP. She requires mod assist with LB ADLs and CGA for mobility.  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  CIR recommended due to functional decline.     Review of Systems  Constitutional:  Negative for chills and fever.  HENT:   Negative for hearing loss.   Eyes:  Positive for blurred vision (people look like muppets (without glasses)).  Respiratory:  Negative for shortness of breath.   Cardiovascular:  Positive for leg swelling (RLE edema greatly improved). Negative for chest pain and palpitations.  Gastrointestinal:  Positive for diarrhea. Negative for heartburn and nausea.  Genitourinary:  Negative for dysuria.  Musculoskeletal:  Positive for falls (after crani and prior to VPS). Negative for back pain and myalgias.  Skin:  Negative for rash.  Neurological:  Positive for tingling, sensory change (RLE) and weakness. Negative for dizziness, seizures and headaches.  Psychiatric/Behavioral:  The patient is not nervous/anxious and does not have insomnia.            Past Medical History:  Diagnosis Date   Anemia     Anxiety     Depression     GERD (gastroesophageal reflux disease)     Headache     Hypertension     Meningioma (HCC)     PONV (postoperative nausea and vomiting)                 Past Surgical History:  Procedure Laterality Date   APPLICATION OF CRANIAL NAVIGATION N/A 02/21/2024    Procedure: COMPUTER-ASSISTED NAVIGATION, FOR CRANIAL PROCEDURE;  Surgeon: Gillie Duncans, MD;  Location: [facility] OR;  Service: Neurosurgery;  Laterality: N/A;   CRANIOTOMY   05/26/2016    Procedure: SUBOCCIPITAL CRANIOTOMY WITH PLACEMENT OF VENTRICULAR CATHETER;  Surgeon: Duncans  Gillie, MD;  Location: [facility] OR;  Service: Neurosurgery;;  CRANIOTOMY - SUBOCCIPITAL   CRANIOTOMY Left 02/21/2024    Procedure: CRANIOTOMY TUMOR EXCISION;  Surgeon: Gillie Duncans, MD;  Location: [facility] OR;  Service: Neurosurgery;  Laterality: Left;  Occipital - left Craniotomy for tumor resection with Stealth   CRANIOTOMY Left 02/26/2024    Procedure: CRANIOTOMY TUMOR EXCISION;  Surgeon: Gillie Duncans, MD;  Location: [facility] OR;  Service: Neurosurgery;  Laterality: Left;  Craniotomy for tumor Stage II   LUMBAR WOUND DEBRIDEMENT N/A 03/18/2024    Procedure: WOUND  DEBRIDEMENT;  Surgeon: Gillie Duncans, MD;  Location: [facility] OR;  Service: Neurosurgery;  Laterality: N/A;  Wound revision at cranial site   PLACEMENT OF LUMBAR DRAIN N/A 03/18/2024    Procedure: PLACEMENT OF LUMBAR DRAIN;  Surgeon: Gillie Duncans, MD;  Location: [facility] OR;  Service: Neurosurgery;  Laterality: N/A;  Placement of Lumbar Drain   VENTRICULOPERITONEAL SHUNT Right 03/24/2024    Procedure: SHUNT INSERTION VENTRICULAR-PERITONEAL With Laparoscopic verification of shunt placement into the peritoneum;  Surgeon: Gillie Duncans, MD;  Location: [facility] OR;  Service: Neurosurgery;  Laterality: Right;   WISDOM TOOTH EXTRACTION                   Family History  Problem Relation Age of Onset   Hypertension Mother     Heart disease Mother     Hypertension Father     Kidney disease Father     Ulcers Father     Diabetes Father          many paternal family members also with diabetes   Ovarian cancer Maternal Grandmother     Cirrhosis Paternal Grandfather          alcohol  related   Thyroid  cancer Maternal Aunt     Colon cancer Neg Hx     Esophageal cancer Neg Hx     Pancreatic cancer Neg Hx     Stomach cancer Neg Hx            Social History:  reports that she has never smoked. She has never used smokeless tobacco. She reports current alcohol  use. She reports that she does not use drugs.     Allergies      Allergies  Allergen Reactions   Watermelon [Citrullus Vulgaris] Nausea And Vomiting            Medications Prior to Admission  Medication Sig Dispense Refill   acetaminophen  (TYLENOL ) 325 MG tablet Take 325 mg by mouth every 6 (six) hours as needed for moderate pain (pain score 4-6) or mild pain (pain score 1-3).       atenolol  (TENORMIN ) 50 MG tablet Take 1 tablet (50 mg total) by mouth daily. 90 tablet 3   cyclobenzaprine  (FLEXERIL ) 10 MG tablet Take 1 tablet (10 mg total) by mouth 3 (three) times daily as needed. (Patient taking differently: Take 10 mg by mouth daily as needed for  muscle spasms.) 60 tablet 1   escitalopram  (LEXAPRO ) 20 MG tablet Take 2 tablets (40 mg total) by mouth daily. 60 tablet 1   hydrochlorothiazide  (HYDRODIURIL ) 12.5 MG tablet Take 1 tablet (12.5 mg total) by mouth daily. 90 tablet 3   HYDROcodone -acetaminophen  (NORCO/VICODIN) 5-325 MG tablet Take 1 tablet by mouth every 6 (six) hours as needed for pain. 60 tablet 0   ibuprofen  (ADVIL ,MOTRIN ) 200 MG tablet Take 200 mg by mouth daily as needed for headache, moderate pain (pain score 4-6) or mild pain (pain score 1-3).  buPROPion  (WELLBUTRIN  SR) 150 MG 12 hr tablet Take 1 tablet (150 mg total) by mouth daily. (Patient not taking: Reported on 04/02/2024) 30 tablet 1   gabapentin  (NEURONTIN ) 300 MG capsule Take 300mg  (1 capsule) by mouth once today, then 600 mg (2 capsules) tomorrow, then 900 mg (3 capsules) each day afterwards (Patient not taking: Reported on 04/02/2024) 93 capsule 0   OVER THE COUNTER MEDICATION Take 2 capsules by mouth daily. Emma supplement (Patient not taking: Reported on 04/02/2024)       SODIUM FLUORIDE , DENTAL RINSE, (PREVIDENT ) 0.2 % SOLN USE AS AN ORAL RINSE, AS DIRECTED ON PACKAGE (Patient not taking: Reported on 04/02/2024) 473 mL 5              Home: Home Living Family/patient expects to be discharged to:: Private residence Living Arrangements: Parent, Other relatives (sister) Available Help at Discharge: Family, Available 24 hours/day (supervision only) Type of Home: House Home Access: Level entry Home Layout: One level Bathroom Shower/Tub: Health visitor: Standard (puts BSC over toilet) Bathroom Accessibility: Yes Home Equipment: Agricultural consultant (2 wheels), [employer] - single point, Rollator (4 wheels), Shower seat, Wheelchair - manual  Lives With: Family   Functional History: Prior Function Prior Level of Function : Independent/Modified Independent, Working/employed, Art gallery manager Comments: Was using the rollator after the surgery in July;  increased dizziness  with CSF leaks and was needing it more; started using the RW for at least 1 week PTA ADLs Comments: increased assistance  needed for ADL tasks due increased weakness; was wearing depends due inability to get to the bathroom.   Functional Status:  Mobility: Bed Mobility Overal bed mobility: Needs Assistance Bed Mobility: Supine to Sit Supine to sit: Supervision General bed mobility comments: increased time/effort Transfers Overall transfer level: Needs assistance Equipment used: Rolling walker (2 wheels) Transfers: Sit to/from Stand Sit to Stand: Contact guard assist General transfer comment: from low EOB with CGA for safety Ambulation/Gait Ambulation/Gait assistance: Contact guard assist Gait Distance (Feet): 100 Feet Assistive device: Rolling walker (2 wheels) Gait Pattern/deviations: Step-through pattern, Decreased stride length General Gait Details: slow with low foot clearance with slight improvement with cues. Cues for shoulder depression and adjusted RW for improved posture Gait velocity: decr   ADL: ADL Overall ADL's : Needs assistance/impaired Eating/Feeding: Sitting, Independent Grooming: Wash/dry hands, Wash/dry face, Oral care, Contact guard assist, Standing Grooming Details (indicate cue type and reason): pt stood at sink for appx 5 minutes to groom with CG assist. Pt fatigues quickly and has poor activity tolerance. Upper Body Bathing: Set up, Sitting Lower Body Bathing: Moderate assistance, Sit to/from stand Upper Body Dressing : Set up, Sitting Lower Body Dressing: Moderate assistance, Sit to/from stand Toilet Transfer: Minimal assistance, Ambulation, BSC/3in1, Regular Toilet, Grab bars, Rolling walker (2 wheels) Toilet Transfer Details (indicate cue type and reason): Pt walked to bathroom with walker to toilet. Pt required assist to get to low toilet.  3:1 raised after toileting task. Toileting- Clothing Manipulation and Hygiene: Minimal  assistance, Sit to/from stand Functional mobility during ADLs: Minimal assistance General ADL Comments: urgency with urination and has difficulty getting to the toilet in time   Cognition: Cognition Orientation Level: Oriented X4 Cognition Arousal: Alert Behavior During Therapy: [facility] for tasks assessed/performed     Blood pressure 135/80, pulse 71, temperature 98.6 F (37 C), temperature source Oral, resp. rate 18, height 5' 8 (1.727 m), weight 102.6 kg, SpO2 100%. Physical Exam Vitals and nursing note reviewed.  Constitutional:  Appearance: Normal appearance.  HENT:     Head:     Comments: Large boggy area under incision left occiput. Staples and sutures in place on small incisions right scalp.  Abdominal:     General: Abdomen is flat.     Palpations: Abdomen is soft.     Comments: Epigastric incision C/D/I  Musculoskeletal:        General: Swelling (RLE) present.     Comments: Lower back puncture (prior drain site) C/D.   Skin:    General: Skin is warm and dry.     Comments: Healing burn right infra clavicular area.   Neurological:     Mental Status: She is alert.  Neuro:  Eyes without evidence of nystagmus  Tone is normal without evidence of spasticity Cerebellar exam shows no evidence of ataxia on finger nose finger or heel to shin testing No evidence of trunkal ataxia  Motor strength is 5/5 in bilateral deltoid, biceps, triceps, finger flexors and extensors, wrist flexors and extensors,4/5 bilateral  hip flexors, knee flexors and extensors, ankle dorsiflexors, plantar flexors, invertors and evertors, toe flexors and extensors  Sensory exam is normal to light touch in the upper and lower limbs , mild hypersensitivity R plantar surface  Cranial nerves II- Visual fields are intact to confrontation testing, no blurring of vision III- no evidence of ptosis, upward, downward and medial gaze intact IV- no vertical diplopia or head tilt V- no facial numbness or  masseter weakness VI- no pupil abduction weakness VII- no facial droop, good lid closure VII- normal auditory acuity IX- no hoarseness X-  no hoarseness XI- no trap or SCM weakness XII- no glossal weakness       Lab Results Last 48 Hours        Results for orders placed or performed during the hospital encounter of 04/01/24 (from the past 48 hours)  Basic metabolic panel with GFR     Status: Abnormal    Collection Time: 04/07/24  4:00 AM  Result Value Ref Range    Sodium 136 135 - 145 mmol/L    Potassium 3.5 3.5 - 5.1 mmol/L    Chloride 102 98 - 111 mmol/L    CO2 24 22 - 32 mmol/L    Glucose, Bld 102 (H) 70 - 99 mg/dL      Comment: Glucose reference range applies only to samples taken after fasting for at least 8 hours.    BUN 10 6 - 20 mg/dL    Creatinine, Ser 9.23 0.44 - 1.00 mg/dL    Calcium  8.8 (L) 8.9 - 10.3 mg/dL    GFR, Estimated >39 >39 mL/min      Comment: (NOTE) Calculated using the CKD-EPI Creatinine Equation (2021)      Anion gap 10 5 - 15      Comment: Performed at [facility], [address] [place], [place] [address]  CBC     Status: Abnormal    Collection Time: 04/07/24  4:00 AM  Result Value Ref Range    WBC 5.5 4.0 - 10.5 K/uL    RBC 2.64 (L) 3.87 - 5.11 MIL/uL    Hemoglobin 7.5 (L) 12.0 - 15.0 g/dL    HCT 77.1 (L) 63.9 - 46.0 %    MCV 86.4 80.0 - 100.0 fL    MCH 28.4 26.0 - 34.0 pg    MCHC 32.9 30.0 - 36.0 g/dL    RDW 87.1 88.4 - 84.4 %    Platelets 443 (H) 150 -  400 K/uL    nRBC 0.0 0.0 - 0.2 %      Comment: Performed at [facility], [address], [place], [place] [address]  Magnesium      Status: None    Collection Time: 04/07/24  4:00 AM  Result Value Ref Range    Magnesium  2.0 1.7 - 2.4 mg/dL      Comment: Performed at [facility], [address] [place], [place] [address]  Basic metabolic panel with GFR     Status: Abnormal    Collection Time: 04/08/24  5:00 AM  Result Value Ref Range    Sodium 138 135 - 145 mmol/L     Potassium 3.8 3.5 - 5.1 mmol/L    Chloride 106 98 - 111 mmol/L    CO2 24 22 - 32 mmol/L    Glucose, Bld 130 (H) 70 - 99 mg/dL      Comment: Glucose reference range applies only to samples taken after fasting for at least 8 hours.    BUN <5 (L) 6 - 20 mg/dL    Creatinine, Ser 9.36 0.44 - 1.00 mg/dL    Calcium  9.1 8.9 - 10.3 mg/dL    GFR, Estimated >39 >39 mL/min      Comment: (NOTE) Calculated using the CKD-EPI Creatinine Equation (2021)      Anion gap 8 5 - 15      Comment: Performed at [facility], [address] [place], [place] [address]  CBC     Status: Abnormal    Collection Time: 04/08/24  5:00 AM  Result Value Ref Range    WBC 4.8 4.0 - 10.5 K/uL    RBC 3.06 (L) 3.87 - 5.11 MIL/uL    Hemoglobin 8.5 (L) 12.0 - 15.0 g/dL    HCT 73.4 (L) 63.9 - 46.0 %    MCV 86.6 80.0 - 100.0 fL    MCH 27.8 26.0 - 34.0 pg    MCHC 32.1 30.0 - 36.0 g/dL    RDW 86.9 88.4 - 84.4 %    Platelets 502 (H) 150 - 400 K/uL    nRBC 0.0 0.0 - 0.2 %      Comment: Performed at [facility], [address], [place], [place] [address]  Magnesium      Status: None    Collection Time: 04/08/24  5:00 AM  Result Value Ref Range    Magnesium  2.1 1.7 - 2.4 mg/dL      Comment: Performed at [facility], [address], [place], [place] [address]      Imaging Results (Last 48 hours)  No results found.         Blood pressure 135/80, pulse 71, temperature 98.6 F (37 C), temperature source Oral, resp. rate 18, height 5' 8 (1.727 m), weight 102.6 kg, SpO2 100%.   Medical Problem List and Plan: 1. Functional deficits secondary to spinal meneingitis             -patient may  shower             -ELOS/Goals: 7-10d, Supervision 2.  Antithrombotics: -DVT/anticoagulation:  Mechanical: Sequential compression devices, below knee Bilateral lower extremities             -antiplatelet therapy: N/a 3. Pain Management:  Tylenol  1000 mg qid for 3 more days then prn.  --oxycodone  prn for severe  pain.   4. Mood/Behavior/Sleep: LCSW to follow for evaluation and support.              -  antipsychotic agents: N/A 5. Neuropsych/cognition: This patient is capable of making decisions on her own behalf. 6. Skin/Wound Care: Routine pressure relief measures.              --monitor for recurrent drainage from scalp or from back.       7. Fluids/Electrolytes/Nutrition: Monitor I/O. Check CMET in am. 8. CSF leak s/p VPS/with meningintis: Cultures without growth but to treat with 2 total weeks empiric antibiotics for meningitis             --Vanc (trough 15-20)/Cefepime  with EOT 04/14/24 --if relapse will need to have shunt removal.  9. Lumbosacral radiculopathy: R-foot paraesthesias-->on gabapentin  300 mg TID. 10. ABLA: Likely due to surgery/infection. Hgb 12.7 (July) --H/H ranging 7.5 to mid 8 range.              --recheck CBC in am.  Monitor for signs of infection.  11. Fasting hyperglycemia: Will check A1C in am.  12.  RLE edema: TEDs and elevation. Dopplers 09/02 negative for DVT RLE. 13. Ependymoma?: Follow up with neurology for input.  14.  Diarrhea: Has had multiple stools for past couple of days but decreasing in frequency.              --d/c laxatives (has been refusing them).              --monitor for c diff.  15.  Obesity class 1 w/BMI 34: Educate on diet and exercise.  16. Mood disorder: Stable off medications.   Sharlet GORMAN Schmitz, PA-C 04/08/2024 I have personally performed a face to face diagnostic evaluation of this patient.  Additionally, I have reviewed and concur with the physician assistant's documentation above. Prentice CHARLENA Compton M.D. [facility] Fellow Am Acad of Phys Med and Rehab Diplomate Am Board of Electrodiagnostic Med Fellow Am Board of Interventional Pain

## 2024-04-08 NOTE — TOC Progression Note (Addendum)
 Transition of Care [facility]) - Transition note   Patient Details  Name: Alexa [name] MRN: [id number] Date of Birth: 05-03-1971  [facility] CM/SW Contact  Andrez JULIANNA George, RN Phone Number: 04/08/2024, 2:14 PM  Clinical Narrative:     Pt is discharging to CIR today. CM signing off.   Expected Discharge Plan: IP Rehab Facility Barriers to Discharge: Continued Medical Work up               Expected Discharge Plan and Services   Discharge Planning Services: CM Consult Post Acute Care Choice: Home Health Living arrangements for the past 2 months: Single Family Home                                       Social Drivers of Health (SDOH) Interventions SDOH Screenings   Food Insecurity: No Food Insecurity (04/03/2024)  Housing: Low Risk  (04/03/2024)  Transportation Needs: No Transportation Needs (04/03/2024)  Utilities: Not At Risk (04/03/2024)  Tobacco Use: Low Risk  (04/01/2024)    Readmission Risk Interventions     No data to display

## 2024-04-08 NOTE — H&P (Shared)
 Physical Medicine and Rehabilitation Admission H&P    Chief Complaint  Patient presents with   Functional deficits due to meningitis.     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  MRI lumbar spine showed a 10 mm X 11 mm  X 26 mm heterogeneously enhancing mass posterior to S2 vertebral body c/w appendicitis and ill defined mass lower lumbar spine and dural enhancement along lower cord concerning for infection. Dr. Vince felt ill defined dural enhancement concerning for meningitis.  LP done revealing WBC 1040,  segmented neurophils, glucose < 20 and TP 563. She was started on Vancomycin  and Cefepime  on 09/01 with Dr. Overton recommending 2 week course antibiotics for meningitis.  Gram stain negative and cultures pending. RLE noted to be edematous and dopplers done was negative for DVT in RLE.   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   CT A/P showed known enhancing mass posterior to S2 not well appreciated and VPS extending to RLQ with no significant free fluid.  She continued to have significant pain in lower back and BLE as well as significant amount of fluid in wound and shunt has been dialed down to 60 mmH2O. Pain is improving but continues to have limitations in activity tolerance. She continues to have tingling right foot and very as high UOP. She requires mod assist with LB ADLs and CGA for mobility.  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  CIR recommended due to functional decline.   Review of Systems  Constitutional:  Negative for chills and fever.  HENT:   Negative for hearing loss.   Eyes:  Positive for blurred vision (people look like muppets (without glasses)).  Respiratory:  Negative for shortness of breath.   Cardiovascular:  Positive for leg swelling (RLE edema greatly improved). Negative for chest pain and palpitations.  Gastrointestinal:  Positive for diarrhea. Negative for heartburn and nausea.  Genitourinary:  Negative for dysuria.  Musculoskeletal:  Positive for falls (after crani and prior to VPS). Negative for back pain and myalgias.  Skin:  Negative for rash.  Neurological:  Positive for tingling, sensory change (RLE) and weakness. Negative for dizziness, seizures and headaches.  Psychiatric/Behavioral:  The patient is not nervous/anxious and does not have insomnia.      Past Medical History:  Diagnosis Date   Anemia    Anxiety    Depression    GERD (gastroesophageal reflux disease)    Headache    Hypertension    Meningioma (HCC)    PONV (postoperative nausea and vomiting)     Past Surgical History:  Procedure Laterality Date   APPLICATION OF CRANIAL NAVIGATION N/A 02/21/2024   Procedure: COMPUTER-ASSISTED NAVIGATION, FOR CRANIAL PROCEDURE;  Surgeon: Gillie Duncans, MD;  Location: [facility] OR;  Service: Neurosurgery;  Laterality: N/A;   CRANIOTOMY  05/26/2016   Procedure: SUBOCCIPITAL CRANIOTOMY WITH PLACEMENT OF VENTRICULAR CATHETER;  Surgeon: Duncans Gillie, MD;  Location: [facility] OR;  Service: Neurosurgery;;  CRANIOTOMY - SUBOCCIPITAL   CRANIOTOMY Left 02/21/2024   Procedure: CRANIOTOMY TUMOR EXCISION;  Surgeon: Gillie Duncans, MD;  Location: [facility] OR;  Service:  Neurosurgery;  Laterality: Left;  Occipital - left Craniotomy for tumor resection with Stealth   CRANIOTOMY Left 02/26/2024   Procedure: CRANIOTOMY TUMOR EXCISION;  Surgeon: Gillie Duncans, MD;  Location: [facility] OR;  Service: Neurosurgery;  Laterality: Left;  Craniotomy for tumor Stage II   LUMBAR WOUND DEBRIDEMENT N/A 03/18/2024   Procedure: WOUND DEBRIDEMENT;  Surgeon: Gillie Duncans, MD;  Location: [facility] OR;  Service: Neurosurgery;  Laterality: N/A;  Wound revision at cranial site   PLACEMENT OF LUMBAR DRAIN N/A 03/18/2024   Procedure: PLACEMENT OF LUMBAR DRAIN;  Surgeon: Gillie Duncans, MD;  Location: [facility] OR;  Service: Neurosurgery;  Laterality: N/A;  Placement of Lumbar Drain   VENTRICULOPERITONEAL SHUNT Right 03/24/2024   Procedure: SHUNT INSERTION VENTRICULAR-PERITONEAL With Laparoscopic verification of shunt placement into the peritoneum;  Surgeon: Gillie Duncans, MD;  Location: [facility] OR;  Service: Neurosurgery;  Laterality: Right;   WISDOM TOOTH EXTRACTION      Family History  Problem Relation Age of Onset   Hypertension Mother    Heart disease Mother    Hypertension Father    Kidney disease Father    Ulcers Father    Diabetes Father        many paternal family members also with diabetes   Ovarian cancer Maternal Grandmother    Cirrhosis Paternal Grandfather        alcohol  related   Thyroid  cancer Maternal Aunt    Colon cancer Neg Hx    Esophageal cancer Neg Hx    Pancreatic cancer Neg Hx    Stomach cancer Neg Hx     Social History:  reports that she has never smoked. She has never used smokeless tobacco. She reports current alcohol  use. She reports that she does not use drugs.   Allergies  Allergen Reactions   Watermelon [Citrullus Vulgaris] Nausea And Vomiting   Medications Prior to Admission  Medication Sig Dispense Refill   acetaminophen  (TYLENOL ) 325 MG tablet Take 325 mg by mouth every 6 (six) hours as needed for moderate pain (pain score 4-6) or mild pain (pain score 1-3).     atenolol  (TENORMIN ) 50 MG tablet Take 1 tablet (50 mg total) by mouth daily. 90 tablet 3   cyclobenzaprine  (FLEXERIL ) 10 MG tablet Take 1 tablet (10 mg total) by mouth 3 (three) times daily as needed. (Patient taking differently: Take 10 mg by mouth daily as needed for muscle spasms.) 60 tablet 1   escitalopram  (LEXAPRO ) 20 MG tablet Take 2 tablets (40 mg total) by mouth  daily. 60 tablet 1   hydrochlorothiazide  (HYDRODIURIL ) 12.5 MG tablet Take 1 tablet (12.5 mg total) by mouth daily. 90 tablet 3   HYDROcodone -acetaminophen  (NORCO/VICODIN) 5-325 MG tablet Take 1 tablet by mouth every 6 (six) hours as needed for pain. 60 tablet 0   ibuprofen  (ADVIL ,MOTRIN ) 200 MG tablet Take 200 mg by mouth daily as needed for headache, moderate pain (pain score 4-6) or mild pain (pain score 1-3).     buPROPion  (WELLBUTRIN  SR) 150 MG 12 hr tablet Take 1 tablet (150 mg total) by mouth daily. (Patient not taking: Reported on 04/02/2024) 30 tablet 1   gabapentin  (NEURONTIN ) 300 MG capsule Take 300mg  (1 capsule) by mouth once today, then 600 mg (2 capsules) tomorrow, then 900 mg (3 capsules) each day afterwards (Patient not taking: Reported on 04/02/2024) 93 capsule 0   OVER THE COUNTER MEDICATION Take 2 capsules by mouth daily. Emma supplement (Patient not taking: Reported on 04/02/2024)     SODIUM FLUORIDE , DENTAL  RINSE, (PREVIDENT ) 0.2 % SOLN USE AS AN ORAL RINSE, AS DIRECTED ON PACKAGE (Patient not taking: Reported on 04/02/2024) 473 mL 5      Home: Home Living Family/patient expects to be discharged to:: Private residence Living Arrangements: Parent, Other relatives (sister) Available Help at Discharge: Family, Available 24 hours/day (supervision only) Type of Home: House Home Access: Level entry Home Layout: One level Bathroom Shower/Tub: Health visitor: Standard (puts BSC over toilet) Bathroom Accessibility: Yes Home Equipment: Agricultural consultant (2 wheels), [employer] - single point, Rollator (4 wheels), Shower seat, Wheelchair - manual  Lives With: Family   Functional History: Prior Function Prior Level of Function : Independent/Modified Independent, Working/employed, Art gallery manager Comments: Was using the rollator after the surgery in July; increased dizziness  with CSF leaks and was needing it more; started using the RW for at least 1 week PTA ADLs Comments:  increased assistance  needed for ADL tasks due increased weakness; was wearing depends due inability to get to the bathroom.  Functional Status:  Mobility: Bed Mobility Overal bed mobility: Needs Assistance Bed Mobility: Supine to Sit Supine to sit: Supervision General bed mobility comments: increased time/effort Transfers Overall transfer level: Needs assistance Equipment used: Rolling walker (2 wheels) Transfers: Sit to/from Stand Sit to Stand: Contact guard assist General transfer comment: from low EOB with CGA for safety Ambulation/Gait Ambulation/Gait assistance: Contact guard assist Gait Distance (Feet): 100 Feet Assistive device: Rolling walker (2 wheels) Gait Pattern/deviations: Step-through pattern, Decreased stride length General Gait Details: slow with low foot clearance with slight improvement with cues. Cues for shoulder depression and adjusted RW for improved posture Gait velocity: decr    ADL: ADL Overall ADL's : Needs assistance/impaired Eating/Feeding: Sitting, Independent Grooming: Wash/dry hands, Wash/dry face, Oral care, Contact guard assist, Standing Grooming Details (indicate cue type and reason): pt stood at sink for appx 5 minutes to groom with CG assist. Pt fatigues quickly and has poor activity tolerance. Upper Body Bathing: Set up, Sitting Lower Body Bathing: Moderate assistance, Sit to/from stand Upper Body Dressing : Set up, Sitting Lower Body Dressing: Moderate assistance, Sit to/from stand Toilet Transfer: Minimal assistance, Ambulation, BSC/3in1, Regular Toilet, Grab bars, Rolling walker (2 wheels) Toilet Transfer Details (indicate cue type and reason): Pt walked to bathroom with walker to toilet. Pt required assist to get to low toilet.  3:1 raised after toileting task. Toileting- Clothing Manipulation and Hygiene: Minimal assistance, Sit to/from stand Functional mobility during ADLs: Minimal assistance General ADL Comments: urgency with  urination and has difficulty getting to the toilet in time  Cognition: Cognition Orientation Level: Oriented X4 Cognition Arousal: Alert Behavior During Therapy: [facility] for tasks assessed/performed   Blood pressure 135/80, pulse 71, temperature 98.6 F (37 C), temperature source Oral, resp. rate 18, height 5' 8 (1.727 m), weight 102.6 kg, SpO2 100%. Physical Exam Vitals and nursing note reviewed.  Constitutional:      Appearance: Normal appearance.  HENT:     Head:     Comments: Large boggy area under incision left occiput. Staples and sutures in place on small incisions right scalp.  Abdominal:     General: Abdomen is flat.     Palpations: Abdomen is soft.     Comments: Epigastric incision C/D/I  Musculoskeletal:        General: Swelling (RLE) present.     Comments: Lower back puncture (prior drain site) C/D.   Skin:    General: Skin is warm and dry.     Comments: Healing burn  right infra clavicular area.   Neurological:     Mental Status: She is alert.     Results for orders placed or performed during the hospital encounter of 04/01/24 (from the past 48 hours)  Basic metabolic panel with GFR     Status: Abnormal   Collection Time: 04/07/24  4:00 AM  Result Value Ref Range   Sodium 136 135 - 145 mmol/L   Potassium 3.5 3.5 - 5.1 mmol/L   Chloride 102 98 - 111 mmol/L   CO2 24 22 - 32 mmol/L   Glucose, Bld 102 (H) 70 - 99 mg/dL    Comment: Glucose reference range applies only to samples taken after fasting for at least 8 hours.   BUN 10 6 - 20 mg/dL   Creatinine, Ser 9.23 0.44 - 1.00 mg/dL   Calcium  8.8 (L) 8.9 - 10.3 mg/dL   GFR, Estimated >39 >39 mL/min    Comment: (NOTE) Calculated using the CKD-EPI Creatinine Equation (2021)    Anion gap 10 5 - 15    Comment: Performed at [facility], [address], [place], [place] [address]  CBC     Status: Abnormal   Collection Time: 04/07/24  4:00 AM  Result Value Ref Range   WBC 5.5 4.0 - 10.5 K/uL   RBC 2.64 (L)  3.87 - 5.11 MIL/uL   Hemoglobin 7.5 (L) 12.0 - 15.0 g/dL   HCT 77.1 (L) 63.9 - 53.9 %   MCV 86.4 80.0 - 100.0 fL   MCH 28.4 26.0 - 34.0 pg   MCHC 32.9 30.0 - 36.0 g/dL   RDW 87.1 88.4 - 84.4 %   Platelets 443 (H) 150 - 400 K/uL   nRBC 0.0 0.0 - 0.2 %    Comment: Performed at [facility], [address] [place], [place] [address]  Magnesium      Status: None   Collection Time: 04/07/24  4:00 AM  Result Value Ref Range   Magnesium  2.0 1.7 - 2.4 mg/dL    Comment: Performed at [facility], [address], [place], [place] [address]  Basic metabolic panel with GFR     Status: Abnormal   Collection Time: 04/08/24  5:00 AM  Result Value Ref Range   Sodium 138 135 - 145 mmol/L   Potassium 3.8 3.5 - 5.1 mmol/L   Chloride 106 98 - 111 mmol/L   CO2 24 22 - 32 mmol/L   Glucose, Bld 130 (H) 70 - 99 mg/dL    Comment: Glucose reference range applies only to samples taken after fasting for at least 8 hours.   BUN <5 (L) 6 - 20 mg/dL   Creatinine, Ser 9.36 0.44 - 1.00 mg/dL   Calcium  9.1 8.9 - 10.3 mg/dL   GFR, Estimated >39 >39 mL/min    Comment: (NOTE) Calculated using the CKD-EPI Creatinine Equation (2021)    Anion gap 8 5 - 15    Comment: Performed at [facility], [address] [place], [place] [address]  CBC     Status: Abnormal   Collection Time: 04/08/24  5:00 AM  Result Value Ref Range   WBC 4.8 4.0 - 10.5 K/uL   RBC 3.06 (L) 3.87 - 5.11 MIL/uL   Hemoglobin 8.5 (L) 12.0 - 15.0 g/dL   HCT 73.4 (L) 63.9 - 53.9 %   MCV 86.6 80.0 - 100.0 fL   MCH 27.8 26.0 - 34.0 pg   MCHC 32.1 30.0 - 36.0 g/dL  RDW 13.0 11.5 - 15.5 %   Platelets 502 (H) 150 - 400 K/uL   nRBC 0.0 0.0 - 0.2 %    Comment: Performed at [facility], [address] [place], [place] [address]  Magnesium      Status: None   Collection Time: 04/08/24  5:00 AM  Result Value Ref Range   Magnesium  2.1 1.7 - 2.4 mg/dL    Comment: Performed at [facility], [address],  [place], [place] [address]   No results found.    Blood pressure 135/80, pulse 71, temperature 98.6 F (37 C), temperature source Oral, resp. rate 18, height 5' 8 (1.727 m), weight 102.6 kg, SpO2 100%.  Medical Problem List and Plan: 1. Functional deficits secondary to ***  -patient may *** shower  -ELOS/Goals: *** 2.  Antithrombotics: -DVT/anticoagulation:  Mechanical: Sequential compression devices, below knee Bilateral lower extremities  -antiplatelet therapy: N/a 3. Pain Management:  Tylenol  1000 mg qid for 3 more days then prn.  --oxycodone  prn for severe pain.   4. Mood/Behavior/Sleep: LCSW to follow for evaluation and support.   -antipsychotic agents: N/A 5. Neuropsych/cognition: This patient is capable of making decisions on her own behalf. 6. Skin/Wound Care: Routine pressure relief measures.   --monitor for recurrent drainage from scalp or from back.     7. Fluids/Electrolytes/Nutrition: Monitor I/O. Check CMET in am. 8. CSF leak s/p VPS/with meningintis: Cultures without growth but to treat with 2 total weeks empiric antibiotics for meningitis  --Vanc (trough 15-20)/Cefepime  with EOT 04/14/24 --if relapse will need to have shunt removal.  9. Lumbosacral radiculopathy: R-foot paraesthesias-->on gabapentin  300 mg TID. 10. ABLA: Likely due to surgery/infection. Hgb 12.7 (July) --H/H ranging 7.5 to mid 8 range.   --recheck CBC in am.  Monitor for signs of infection.  11. Fasting hyperglycemia: Will check A1C in am.  12.  RLE edema: TEDs and elevation. Dopplers 09/02 negative for DVT RLE. 13. Ependymoma?: Follow up with neurology for input.  14.  Diarrhea: Has had multiple stools for past couple of days but decreasing in frequency.   --d/c laxatives (has been refusing them).   --monitor for c diff.  15.  Obesity class 1 w/BMI 34: Educate on diet and exercise.  16. Mood disorder: Stable off medications.   ***  Sharlet GORMAN Schmitz, PA-C 04/08/2024

## 2024-04-08 NOTE — Progress Notes (Signed)
 Physical Therapy Treatment Patient Details Name: Alexa [name] MRN: [id number] DOB: [birth year] Today's Date: 04/08/2024   History of Present Illness 53 year old with  recent meningioma resection 7/29 - 7/30 with Dr. Gillie readmitted on 8/17 with CSF leak from craniotomy site underwent lumbar drain placement on 8/18. Eventually underwent VP shunt placement on 8/24. Admitted with lower back pain along with fevers concerning for spinal meningitis. MRI shows dural enhancement of the lumbar region associated with S2 mass. PMH: HTN, mood disorder.    PT Comments  Pt received in supine and agreeable to session. Pt reports improved pain today, but reports radicular pain in R thigh during ambulation. Pt able to tolerate increased gait distance this session, but continues to be limited by impaired activity tolerance. Pt able to perform BLE exercises with encouragement to complete independently.    If plan is discharge home, recommend the following: A little help with walking and/or transfers;A little help with bathing/dressing/bathroom;Assist for transportation;Help with stairs or ramp for entrance;Assistance with cooking/housework   Can travel by private vehicle        Equipment Recommendations  BSC/3in1    Recommendations for Other Services       Precautions / Restrictions Precautions Precautions: Fall Recall of Precautions/Restrictions: Intact Precaution/Restrictions Comments: SBP 90-160 Restrictions Weight Bearing Restrictions Per Provider Order: No     Mobility  Bed Mobility Overal bed mobility: Needs Assistance Bed Mobility: Supine to Sit     Supine to sit: Supervision     General bed mobility comments: increased time/effort    Transfers Overall transfer level: Needs assistance Equipment used: Rolling walker (2 wheels) Transfers: Sit to/from Stand Sit to Stand: Contact guard assist           General transfer comment: from low EOB with CGA for safety     Ambulation/Gait Ambulation/Gait assistance: Contact guard assist Gait Distance (Feet): 100 Feet Assistive device: Rolling walker (2 wheels) Gait Pattern/deviations: Step-through pattern, Decreased stride length Gait velocity: decr     General Gait Details: slow with low foot clearance with slight improvement with cues. Cues for shoulder depression and adjusted RW for improved posture   Stairs             Wheelchair Mobility     Tilt Bed    Modified Rankin (Stroke Patients Only)       Balance Overall balance assessment: Needs assistance Sitting-balance support: No upper extremity supported, Feet supported Sitting balance-Leahy Scale: Good Sitting balance - Comments: sitting EOB   Standing balance support: No upper extremity supported, During functional activity Standing balance-Leahy Scale: Fair Standing balance comment: with RW support                            Communication Communication Communication: No apparent difficulties  Cognition Arousal: Alert Behavior During Therapy: WFL for tasks assessed/performed   PT - Cognitive impairments: No apparent impairments                         Following commands: Intact      Cueing Cueing Techniques: Verbal cues  Exercises General Exercises - Lower Extremity Long Arc Quad: AROM, Both, Seated, 10 reps Hip Flexion/Marching: AROM, Both, Seated, 10 reps    General Comments        Pertinent Vitals/Pain Pain Assessment Pain Assessment: Faces Faces Pain Scale: Hurts little more Pain Location: headache, RLE redicular pain Pain Descriptors / Indicators: Aching, Headache Pain Intervention(s): Limited  activity within patient's tolerance, Monitored during session, Repositioned     PT Goals (current goals can now be found in the care plan section) Acute Rehab PT Goals Patient Stated Goal: to gain independence and not need assistance at home PT Goal Formulation: With patient Time For  Goal Achievement: 04/19/24 Progress towards PT goals: Progressing toward goals    Frequency    Min 2X/week       AM-PAC PT 6 Clicks Mobility   Outcome Measure  Help needed turning from your back to your side while in a flat bed without using bedrails?: None Help needed moving from lying on your back to sitting on the side of a flat bed without using bedrails?: A Little Help needed moving to and from a bed to a chair (including a wheelchair)?: A Little Help needed standing up from a chair using your arms (e.g., wheelchair or bedside chair)?: A Little Help needed to walk in hospital room?: A Little Help needed climbing 3-5 steps with a railing? : A Little 6 Click Score: 19    End of Session Equipment Utilized During Treatment: Gait belt Activity Tolerance: Patient tolerated treatment well Patient left: in chair;with call bell/phone within reach;with chair alarm set Nurse Communication: Mobility status PT Visit Diagnosis: Other abnormalities of gait and mobility (R26.89);Other symptoms and signs involving the nervous system (R29.898)     Time: 9152-9088 PT Time Calculation (min) (ACUTE ONLY): 24 min  Charges:    $Gait Training: 8-22 mins $Therapeutic Exercise: 8-22 mins PT General Charges $$ ACUTE PT VISIT: 1 Visit                    Darryle George, PTA Acute Rehabilitation Services Secure Chat Preferred  [phone]    Darryle George 04/08/2024, 10:58 AM

## 2024-04-08 NOTE — Progress Notes (Signed)
 Signed     Expand All Collapse All PMR Admission Coordinator Pre-Admission Assessment   Patient: CHRISTELL STEINMILLER is an 53 y.o., female MRN: [id number] DOB: November 17, 1970 Height: 5' 8 (172.7 cm) Weight: 102.6 kg   Insurance Information HMO:     PPO: yes     PCP:      IPA:      80/20:      OTHER:  PRIMARY: Jolynn Davene Leyden      Policy#: [id number]      Subscriber: patient CM Name: Ellouise Nap      Phone#: [phone]     Fax#: [phone] Pre-Cert#: [id number]   Received approval from Ellouise with [employer]. Pt approved from 04/08/24-04/14/24. Pt approved for 7 days.  Review date is 04/15/24.  Employer:  Benefits:  Phone #: [phone]     Name:  Eff. Date: 08/02/23-still active     Deduct: $300 ($300 met)      Out of Pocket Max: $7,900 ($7,900 met)      Life Max: NA CIR: 80% coverage, 20% co-insurance      SNF: 80% coverage, 20% co-insurance Outpatient: 80% coverage     Co-Pay: 20% co-insurance Home Health: 80% coverage      Co-Pay: 20% co-insurance DME: 80% coverage     Co-Pay: 20% co-insurance Providers: in-network SECONDARY:       Policy#:      Phone#:    Artist:       Phone#:    The Data processing manager" for patients in Inpatient Rehabilitation Facilities with attached "Privacy Act Statement-Health Care Records" was provided and verbally reviewed with: Patient   Emergency Contact Information Contact Information       Name Relation Home Work Mobile    Morace,Barbara Mother     [phone]    Chinita Gauze Sister [phone]   [phone]         Other Contacts   None on File        Current Medical History  Patient Admitting Diagnosis: spinal meningitis History of Present Illness: Pt is a 53 year old female with medical hx significant for: anemia, anxiety, depression, GERD, HTN, meningioma resection  (7/29, 7/30). Pt presented to [facility] on 04/01/24 d/t severe lumbar back pain, fever and new onset right-sided foot paresthesia. Pt  recently admitted to hospital on 8/17 with CSF leak. Pt underwent multiple surgeries including lumbar drain placement (03/18/24), shunt placement (03/24/24). Chest x-ray negative. CT head  and CT abdomen/pelvis negative. 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 LP unsuccessful in ED. Underwent fluoroscopic guided LP on 04/02/24. CSF showed elevated protein, decreased glucose which is consistent with infection. CSF cultures NGTD.  Therapy evaluations completed and CIR recommended d/t pt's deficits in functional mobility.    Patient's medical record from [facility] has been reviewed by the rehabilitation admission coordinator and physician.   Past Medical History      Past Medical History:  Diagnosis Date   Anemia     Anxiety     Depression     GERD (gastroesophageal reflux disease)     Headache     Hypertension     Meningioma (HCC)     PONV (postoperative nausea and vomiting)            Has the patient had major surgery during 100 days prior to admission? Yes   Family History   family history includes Cirrhosis in her paternal grandfather; Diabetes in  her father; Heart disease in her mother; Hypertension in her father and mother; Kidney disease in her father; Ovarian cancer in her maternal grandmother; Thyroid  cancer in her maternal aunt; Ulcers in her father.   Current Medications  Current Medications    Current Facility-Administered Medications:    acetaminophen  (TYLENOL ) tablet 1,000 mg, 1,000 mg, Oral, Q8H, 1,000 mg at 04/08/24 1323 **FOLLOWED BY** [START ON 04/10/2024] acetaminophen  (TYLENOL ) tablet 650 mg, 650 mg, Oral, Q6H PRN, Perri DELENA Meliton Mickey., MD   atenolol  (TENORMIN ) tablet 50 mg, 50 mg, Oral, Daily, Segars, Dorn, MD, 50 mg at 04/08/24 0815   ceFEPIme  (MAXIPIME ) 2 g in sodium chloride  0.9 % 100 mL IVPB, 2 g, Intravenous, Q8H, Segars, Dorn, MD, Last Rate: 200 mL/hr at 04/08/24 0823, 2 g at 04/08/24  9176   Chlorhexidine  Gluconate Cloth 2 % PADS 6 each, 6 each, Topical, Daily, Amin, Ankit C, MD, 6 each at 04/08/24 0818   docusate sodium  (COLACE) capsule 100 mg, 100 mg, Oral, BID, Amin, Ankit C, MD, 100 mg at 04/05/24 9148   gabapentin  (NEURONTIN ) capsule 400 mg, 400 mg, Oral, TID, Perri DELENA Meliton Mickey., MD   glucagon  (human recombinant) (GLUCAGEN) injection 1 mg, 1 mg, Intravenous, PRN, Amin, Ankit C, MD   guaiFENesin  (ROBITUSSIN) 100 MG/5ML liquid 5 mL, 5 mL, Oral, Q4H PRN, Amin, Ankit C, MD   HYDROmorphone  (DILAUDID ) injection 0.5 mg, 0.5 mg, Intravenous, Q4H PRN, Segars, Jonathan, MD, 0.5 mg at 04/05/24 0608   ipratropium-albuterol  (DUONEB) 0.5-2.5 (3) MG/3ML nebulizer solution 3 mL, 3 mL, Nebulization, Q4H PRN, Amin, Ankit C, MD   melatonin tablet 6 mg, 6 mg, Oral, QHS PRN, Segars, Jonathan, MD   methocarbamol  (ROBAXIN ) tablet 500 mg, 500 mg, Oral, TID, Perri DELENA Meliton Mickey., MD, 500 mg at 04/08/24 9183   ondansetron  (ZOFRAN ) injection 4 mg, 4 mg, Intravenous, Q6H PRN, Segars, Jonathan, MD   Oral care mouth rinse, 15 mL, Mouth Rinse, PRN, Amin, Ankit C, MD   oxyCODONE  (Oxy IR/ROXICODONE ) immediate release tablet 5-10 mg, 5-10 mg, Oral, Q4H PRN, Amin, Ankit C, MD, 10 mg at 04/05/24 0851   polyethylene glycol (MIRALAX  / GLYCOLAX ) packet 17 g, 17 g, Oral, Daily PRN, Segars, Jonathan, MD   polyethylene glycol (MIRALAX  / GLYCOLAX ) packet 17 g, 17 g, Oral, BID, Amin, Ankit C, MD, 17 g at 04/05/24 0852   senna-docusate (Senokot-S) tablet 1 tablet, 1 tablet, Oral, QHS PRN, Amin, Ankit C, MD, 1 tablet at 04/03/24 2117   sodium chloride  flush (NS) 0.9 % injection 10-40 mL, 10-40 mL, Intracatheter, Q12H, Perri DELENA Meliton Mickey., MD, 10 mL at 04/06/24 0901   sodium chloride  flush (NS) 0.9 % injection 10-40 mL, 10-40 mL, Intracatheter, PRN, Perri DELENA Meliton Mickey., MD   sodium chloride  flush (NS) 0.9 % injection 3 mL, 3 mL, Intravenous, Q12H, Segars, Jonathan, MD, 3 mL at 04/08/24 0817   vancomycin   (VANCOCIN ) 1,000 mg in sodium chloride  0.9 % 250 mL IVPB, 1,000 mg, Intravenous, Q8H, Laron Agent, RPH, Last Rate: 250 mL/hr at 04/08/24 1330, 1,000 mg at 04/08/24 1330     Patients Current Diet:  Diet Order                  Diet - low sodium heart healthy             Diet regular Room service appropriate? Yes; Fluid consistency: Thin  Diet effective now  Precautions / Restrictions Precautions Precautions: Fall Precaution/Restrictions Comments: SBP 90-160 Restrictions Weight Bearing Restrictions Per Provider Order: No    Has the patient had 2 or more falls or a fall with injury in the past year? Yes   Prior Activity Level Limited Community (1-2x/wk): MD appointments   Prior Functional Level Self Care: Did the patient need help bathing, dressing, using the toilet or eating? Needed some help   Indoor Mobility: Did the patient need assistance with walking from room to room (with or without device)? Independent   Stairs: Did the patient need assistance with internal or external stairs (with or without device)? Unknown (avoids stairs)   Functional Cognition: Did the patient need help planning regular tasks such as shopping or remembering to take medications? Independent   Patient Information Are you of Hispanic, Latino/a,or Spanish origin?: A. No, not of Hispanic, Latino/a, or Spanish origin What is your race?: B. Black or African American Do you need or want an interpreter to communicate with a doctor or health care staff?: 0. No   Patient's Response To:  Health Literacy and Transportation Is the patient able to respond to health literacy and transportation needs?: Yes Health Literacy - How often do you need to have someone help you when you read instructions, pamphlets, or other written material from your doctor or pharmacy?: Never In the past 12 months, has lack of transportation kept you from medical appointments or from getting medications?:  No In the past 12 months, has lack of transportation kept you from meetings, work, or from getting things needed for daily living?: No   Home Assistive Devices / Equipment Home Equipment: Agricultural consultant (2 wheels), [employer] - single point, Rollator (4 wheels), Shower seat, Wheelchair - manual   Prior Device Use: Indicate devices/aids used by the patient prior to current illness, exacerbation or injury? Walker   Current Functional Level Cognition   Orientation Level: Oriented X4    Extremity Assessment (includes Sensation/Coordination)   Upper Extremity Assessment: Defer to OT evaluation  Lower Extremity Assessment: RLE deficits/detail, LLE deficits/detail RLE Deficits / Details: At least 3/5, deferred MMT due to pain. Alert to light touch, however, reports tingling sensation in R plantar surface of foot LLE Deficits / Details: At least 3/5, deferred MMT due to pain LLE Sensation: WNL     ADLs   Overall ADL's : Needs assistance/impaired Eating/Feeding: Sitting, Independent Grooming: Wash/dry hands, Wash/dry face, Oral care, Contact guard assist, Standing Grooming Details (indicate cue type and reason): pt stood at sink for appx 5 minutes to groom with CG assist. Pt fatigues quickly and has poor activity tolerance. Upper Body Bathing: Set up, Sitting Lower Body Bathing: Moderate assistance, Sit to/from stand Upper Body Dressing : Set up, Sitting Lower Body Dressing: Moderate assistance, Sit to/from stand Toilet Transfer: Minimal assistance, Ambulation, BSC/3in1, Regular Toilet, Grab bars, Rolling walker (2 wheels) Toilet Transfer Details (indicate cue type and reason): Pt walked to bathroom with walker to toilet. Pt required assist to get to low toilet.  3:1 raised after toileting task. Toileting- Clothing Manipulation and Hygiene: Minimal assistance, Sit to/from stand Functional mobility during ADLs: Minimal assistance General ADL Comments: urgency with urination and has difficulty  getting to the toilet in time     Mobility   Overal bed mobility: Needs Assistance Bed Mobility: Supine to Sit Supine to sit: Supervision General bed mobility comments: increased time/effort     Transfers   Overall transfer level: Needs assistance Equipment used: Rolling walker (2 wheels) Transfers: Sit  to/from Stand Sit to Stand: Contact guard assist General transfer comment: from low EOB with CGA for safety     Ambulation / Gait / Stairs / Wheelchair Mobility   Ambulation/Gait Ambulation/Gait assistance: Contact guard assist Gait Distance (Feet): 100 Feet Assistive device: Rolling walker (2 wheels) Gait Pattern/deviations: Step-through pattern, Decreased stride length General Gait Details: slow with low foot clearance with slight improvement with cues. Cues for shoulder depression and adjusted RW for improved posture Gait velocity: decr     Posture / Balance Dynamic Sitting Balance Sitting balance - Comments: sitting EOB Balance Overall balance assessment: Needs assistance Sitting-balance support: No upper extremity supported, Feet supported Sitting balance-Leahy Scale: Good Sitting balance - Comments: sitting EOB Standing balance support: No upper extremity supported, During functional activity Standing balance-Leahy Scale: Fair Standing balance comment: with RW support     Special considerations/life events  Skin Burn: throat/right; Surgical Incision: abdomen; Surgical Incision: head/right; Surgical Incision: abdomen/left, lower; Surgical Incision: head/left, posterior, lateral and Bladder incontinence    Previous Home Environment (from acute therapy documentation) Living Arrangements: Parent, Other relatives (sister)  Lives With: Family Available Help at Discharge: Family, Available 24 hours/day (supervision only) Type of Home: House Home Layout: One level Home Access: Level entry Bathroom Shower/Tub: Health visitor: Standard (puts BSC over  toilet) Bathroom Accessibility: Yes How Accessible: Accessible via walker Home Care Services: Yes Type of Home Care Services: Home OT, Home PT   Discharge Living Setting Plans for Discharge Living Setting: Patient's home Type of Home at Discharge: House Discharge Home Layout: One level Discharge Home Access: Level entry Discharge Bathroom Shower/Tub: Walk-in shower Discharge Bathroom Toilet: Standard (puts BSC over toilet) Discharge Bathroom Accessibility: Yes How Accessible: Accessible via walker Does the patient have any problems obtaining your medications?: No   Social/Family/Support Systems Anticipated Caregiver: Jeoffrey Miu (sister), Heron Sar (mother) and Zoraya Fiorenza (sister) Anticipated Caregiver's Contact Information: Amber: [phone], Heron: [phone] Caregiver Availability: 24/7 Discharge Plan Discussed with Primary Caregiver: Yes Is Caregiver In Agreement with Plan?: Yes Does Caregiver/Family have Issues with Lodging/Transportation while Pt is in Rehab?: No   Goals Patient/Family Goal for Rehab: Mod I-Supervision: PT/OT Expected length of stay: 7-10 days Pt/Family Agrees to Admission and willing to participate: Yes Program Orientation Provided & Reviewed with Pt/Caregiver Including Roles  & Responsibilities: Yes   Decrease burden of Care through IP rehab admission: NA   Possible need for SNF placement upon discharge: Not anticipated   Patient Condition: I have reviewed medical records from [facility], spoken with CM, and patient and family member. I met with patient at the bedside and discussed via phone for inpatient rehabilitation assessment.  Patient will benefit from ongoing PT and OT, can actively participate in 3 hours of therapy a day 5 days of the week, and can make measurable gains during the admission.  Patient will also benefit from the coordinated team approach during an Inpatient Acute Rehabilitation admission.  The patient will  receive intensive therapy as well as Rehabilitation physician, nursing, social worker, and care management interventions.  Due to bladder management, safety, skin/wound care, disease management, medication administration, pain management, and patient education the patient requires 24 hour a day rehabilitation nursing.  The patient is currently CGA with mobility and CGA A-Min A with basic ADLs.  Discharge setting and therapy post discharge at home with home health is anticipated.  Patient has agreed to participate in the Acute Inpatient Rehabilitation Program and will admit today.   Preadmission Screen Completed By:  Sharlie Shreffler SHAUNNA Yvone Cohens, 04/08/2024 2:45 PM ______________________________________________________________________   Discussed status with Dr. Carilyn on 04/08/24 at 2:45 PM and received approval for admission today.   Admission Coordinator:  Tinnie SHAUNNA Yvone Cohens, CCC-SLP, time 2:45 PM/Date 04/08/24     Assessment/Plan: Diagnosis: Spinal meningitis Does the need for close, 24 hr/day Medical supervision in concert with the patient's rehab needs make it unreasonable for this patient to be served in a less intensive setting? 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 Yes Does the patient require coordinated care of a physician, rehab nurse, PT, OT, and SLP to address physical and functional deficits in the context of the above medical diagnosis(es)? Yes Addressing deficits in the following areas: balance, endurance, locomotion, strength, transferring, bowel/bladder control, bathing, dressing, toileting, cognition, speech, and psychosocial support Can the patient  actively participate in an intensive therapy program of at least 3 hrs of therapy 5 days a week? Yes The potential for patient to make measurable gains while on inpatient rehab is good Anticipated functional outcomes upon discharge from inpatient rehab: supervision PT, supervision OT, supervision SLP Estimated rehab length of stay to reach the above functional goals is: 7-10d Anticipated discharge destination: Home 10. Overall Rehab/Functional Prognosis: good     MD Signature: Prentice CHARLENA Carilyn M.D. [facility] Fellow Am Acad of Phys Med and Rehab Diplomate Am Board of Electrodiagnostic Med Fellow Am Board of Interventional Pain

## 2024-04-08 NOTE — Discharge Summary (Addendum)
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 Sinuses/Orbits: Postoperative mild left mastoid opacification. Other Visualized paranasal sinuses and mastoids are stable and well aerated. Other: Postoperative changes to the left suboccipital scalp with increased overlying fluid collection compared to 02/26/2024, possible CSF leak or pseudomeningocele (simple fluid density series 2 image 6. Thin septation within the extracranial portion of the collection series 2, image 8. Overall this fluid now encompasses about 42 x 65 x up to 100 mm (AP by transverse by CC) see series 4, image 18. Estimated volume 120 mL. Elsewhere scalp and orbits soft tissues appear negative. IMPRESSION: 1. Postoperative left suboccipital craniotomy scalp collection of mostly simple fluid, estimated volume up to 120 mL, could be CSF leak or pseudomeningocele. 2. Posterior left tentorium area resection cavity with mild residual lateral dural nodularity appears stable to postoperative MRI last month. 3. No ventriculomegaly.  No new intracranial abnormality identified.  Electronically Signed   By: VEAR Hurst M.D.   On: 03/24/2024 04:08   DG C-Arm 1-60 Min-No Report Result Date: 03/18/2024 Fluoroscopy was utilized by the requesting physician.  No radiographic interpretation.   DG C-Arm 1-60 Min-No Report Result Date: 03/18/2024 Fluoroscopy was utilized by the requesting physician.  No radiographic interpretation.    Microbiology: Recent Results (from the past 240 hours)  Resp panel by RT-PCR (RSV, Flu Marylynne Keelin&B, Covid) Anterior Nasal Swab     Status: None   Collection Time: 04/01/24  4:04 PM   Specimen: Anterior Nasal Swab  Result Value Ref Range Status   SARS Coronavirus 2 by RT PCR NEGATIVE NEGATIVE Final   Influenza Enis Riecke by PCR NEGATIVE NEGATIVE Final   Influenza B by PCR NEGATIVE NEGATIVE Final    Comment: (NOTE) The Xpert Xpress SARS-CoV-2/FLU/RSV plus assay is intended as an aid in the diagnosis of influenza from Nasopharyngeal swab specimens and should not be used as Shameek Nyquist sole basis for treatment. Nasal washings and aspirates are unacceptable for Xpert Xpress SARS-CoV-2/FLU/RSV testing.  Fact Sheet for Patients: BloggerCourse.com  Fact Sheet for Healthcare Providers: SeriousBroker.it  This test is not yet approved or cleared by the [place]  FDA and has been authorized for detection and/or diagnosis of SARS-CoV-2 by FDA under an Emergency Use Authorization (EUA). This EUA will remain in effect (meaning this test can be used) for the duration of the COVID-19 declaration under Section 564(b)(1) of the Act, 21 U.S.C. section 360bbb-3(b)(1), unless the authorization is terminated or revoked.     Resp Syncytial Virus by PCR NEGATIVE NEGATIVE Final    Comment: (NOTE) Fact Sheet for Patients: BloggerCourse.com  Fact Sheet for Healthcare Providers: SeriousBroker.it  This test is not yet approved or cleared by the [place]  FDA and has been  authorized for detection and/or diagnosis of SARS-CoV-2 by FDA under an Emergency Use Authorization (EUA). This EUA will remain in effect (meaning this test can be used) for the duration of the COVID-19 declaration under Section 564(b)(1) of the Act, 21 U.S.C. section 360bbb-3(b)(1), unless the authorization is terminated or revoked.  Performed at [facility], [address], [place], [place] [address]   Blood Culture (routine x 2)  Status: None   Collection Time: 04/01/24  4:04 PM   Specimen: BLOOD  Result Value Ref Range Status   Specimen Description BLOOD RIGHT ANTECUBITAL  Final   Special Requests   Final    BOTTLES DRAWN AEROBIC AND ANAEROBIC Blood Culture results may not be optimal due to an inadequate volume of blood received in culture bottles   Culture   Final    NO GROWTH 5 DAYS Performed at [facility], [address], [place], [place] [address]    Report Status 04/06/2024 FINAL  Final  Blood Culture (routine x 2)     Status: None   Collection Time: 04/01/24  4:04 PM   Specimen: BLOOD  Result Value Ref Range Status   Specimen Description BLOOD LEFT ANTECUBITAL  Final   Special Requests   Final    BOTTLES DRAWN AEROBIC AND ANAEROBIC Blood Culture adequate volume   Culture   Final    NO GROWTH 5 DAYS Performed at [facility], [address] [place], [place] [address]    Report Status 04/06/2024 FINAL  Final  CSF culture w Gram Stain     Status: None (Preliminary result)   Collection Time: 04/02/24  4:35 PM   Specimen: Janella Rogala: PATH Cytology CSF; Cerebrospinal Fluid   B: PATH Cytology CSF; Cerebrospinal Fluid  Result Value Ref Range Status   Specimen Description CSF  Final   Special Requests NONE  Final   Gram Stain   Final    ABUNDANT WBC PRESENT,BOTH PMN AND MONONUCLEAR NO ORGANISMS SEEN    Culture   Final    NO GROWTH 6 DAYS CONTINUING TO HOLD Performed at [facility], [address] [place], [place] [address]    Report Status  PENDING  Incomplete     Labs: Basic Metabolic Panel: Recent Labs  Lab 04/02/24 0442 04/03/24 0331 04/04/24 0258 04/05/24 0313 04/06/24 0322 04/07/24 0400 04/08/24 0500  NA 139 134* 133* 135 138 136 138  K 4.1 4.2 3.8 4.3 4.4 3.5 3.8  CL 101 101 100 101 102 102 106  CO2 25 23 23 26 24 24 24   GLUCOSE 109* 112* 133* 103* 107* 102* 130*  BUN <5* 10 9 11 7 10  <5*  CREATININE 0.75 0.76 0.74 0.78 0.69 0.76 0.63  CALCIUM  9.2 8.5* 8.6* 8.6* 9.3 8.8* 9.1  MG 2.0 1.8 1.7 2.0 2.1 2.0 2.1  PHOS 3.7 4.3  --   --   --   --   --    Liver Function Tests: Recent Labs  Lab 04/01/24 1544  AST 13*  ALT 11  ALKPHOS 47  BILITOT 0.2  PROT 7.1  ALBUMIN  3.2*   No results for input(s): LIPASE, AMYLASE in the last 168 hours. No results for input(s): AMMONIA in the last 168 hours. CBC: Recent Labs  Lab 04/01/24 1544 04/02/24 0442 04/04/24 0258 04/05/24 0313 04/06/24 0322 04/07/24 0400 04/08/24 0500  WBC 8.9   < > 9.3 7.1 6.4 5.5 4.8  NEUTROABS 5.6  --   --   --   --   --   --   HGB 8.4*   < > 7.7* 7.7* 8.9* 7.5* 8.5*  HCT 26.4*   < > 23.4* 23.4* 28.0* 22.8* 26.5*  MCV 88.3   < > 85.4 86.3 87.5 86.4 86.6  PLT 559*   < > 457* 479* 536* 443* 502*   < > = values in this interval not displayed.   Cardiac Enzymes:  No results for input(s): CKTOTAL, CKMB, CKMBINDEX, TROPONINI in the last 168 hours. BNP: BNP (last 3 results) No results for input(s): BNP in the last 8760 hours.  ProBNP (last 3 results) No results for input(s): PROBNP in the last 8760 hours.  CBG: No results for input(s): GLUCAP in the last 168 hours.     Signed:  Meliton Monte MD.  [facility] 04/08/2024, 2:45 PM

## 2024-04-08 NOTE — Progress Notes (Signed)
 PROGRESS NOTE    Alexa [name]  FMW:[id number] DOB: [birth year] DOA: 04/01/2024 PCP: Patient, No Pcp Per  Chief Complaint  Patient presents with   Back Pain    Brief Narrative:   53 year old with history of HTN, mood disorder, recent meningioma resection 7/29 - 7/30 with Dr. Gillie readmitted on 8/17 with CSF leak from craniotomy site underwent lumbar drain placement on 8/18. Eventually underwent VP shunt placement on 8/24 with laparoscopic access with Dr. Stevie. Now presents with lower back pain along with fevers concerning for spinal meningitis. MRI shows dural enhancement of the lumbar region associated with S2 mass. Neurosurgery and infectious disease has been consulted.   Assessment & Plan:   Principal Problem:   Meningitis spinal Active Problems:   Sepsis [facility])  Healthcare associated spinal meningitis, in setting of recent neurosurgical procedures  HX recent Meningioma resection 7/29, redo 7/30  Hx recent CSF leak from craniectomy, Lumbar drain 8/18, + VP shunt with laparoscopic placement on 8/24  -MRI lumbar spine consistent with dural enhancement/infection.  CT head and CT abdomen pelvis unremarkable.  Seen by neurosurgery. - s/p LP by IR -> elevated protein, low glucose.  Elevated WBC with 95% neutrophils.  Culture pending.   -- appreciate neurosurgery assistance - Currently on empiric antibiotics.  Consulted ID - planning 2 weeks empiric treatment for meningitis.    Lumbosacral radiculopathy, R foot paresthesia  Some lumbar radiculopathy especially towards right lower extremity.  Neurosurgery team is following. Gabapentin  300 mg TID   ? 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:  -- NSGY may manage and followup on this as well   Reviewed read with rads, it should be reviewed and addended by prior reader (pending - will call back rads)   Asymmetric RLE edema, mild  No evidence DVT in lower extremity    HTN  Continue home Atenolol . Hold  home hydrochlorothiazide  I/s/o sepsis.    Mood d/o Has not been taking bupropion  or Lexapro    Obesity Class I  Body mass index is 31.93 kg/m. Obesity class I would benefit from weight loss outpatient    DVT prophylaxis: SCD Code Status: full Family Communication: none Disposition:   Status is: Inpatient Remains inpatient appropriate because: need for continued inpatient care   Consultants:  Neurology ID  Procedures:  9/2 IMPRESSION: 1. Fluoroscopically-guided L5-S1 lumbar puncture. 2. Opening pressure: 35.5 cm water 3. 13 mL of CSF collected and sent for laboratory studies. 4. No immediate post-procedure complication.  9/1 LP Multiple attempts without access to CSF   Antimicrobials:  Anti-infectives (From admission, onward)    Start     Dose/Rate Route Frequency Ordered Stop   04/02/24 1000  vancomycin  (VANCOCIN ) 1,000 mg in sodium chloride  0.9 % 250 mL IVPB        1,000 mg 250 mL/hr over 60 Minutes Intravenous Every 8 hours 04/02/24 0004     04/02/24 0000  ceFEPIme  (MAXIPIME ) 2 g in sodium chloride  0.9 % 100 mL IVPB        2 g 200 mL/hr over 30 Minutes Intravenous Every 8 hours 04/01/24 2136     04/02/24 0000  vancomycin  (VANCOREADY) IVPB 2000 mg/400 mL        2,000 mg 200 mL/hr over 120 Minutes Intravenous  Once 04/01/24 2242 04/02/24 0245       Subjective: C/o swelling in head  Objective: Vitals:   04/07/24 1927 04/08/24 0300 04/08/24 0922 04/08/24 1137  BP: 124/77 124/75 126/81 135/80  Pulse:  75 81 71  Resp: 16 16  18   Temp: 98.2 F (36.8 C) 98.4 F (36.9 C) 98.2 F (36.8 C) 98.6 F (37 C)  TempSrc: Oral Oral Oral Oral  SpO2: 99% 99% 100% 100%  Weight:      Height:       No intake or output data in the 24 hours ending 04/08/24 1408  Filed Weights   04/03/24 0500 04/04/24 0535 04/05/24 0610  Weight: 103.7 kg 105.4 kg 102.6 kg    Examination:  General: No acute distress. Cardiovascular: RRR Lungs: unlabored Neurological: Alert and  oriented 3. Moves all extremities 4 with equal strength. Cranial nerves II through XII grossly intact. Extremities: No clubbing or cyanosis. No edema.   Data Reviewed: I have personally reviewed following labs and imaging studies  CBC: Recent Labs  Lab 04/01/24 1544 04/02/24 0442 04/04/24 0258 04/05/24 0313 04/06/24 0322 04/07/24 0400 04/08/24 0500  WBC 8.9   < > 9.3 7.1 6.4 5.5 4.8  NEUTROABS 5.6  --   --   --   --   --   --   HGB 8.4*   < > 7.7* 7.7* 8.9* 7.5* 8.5*  HCT 26.4*   < > 23.4* 23.4* 28.0* 22.8* 26.5*  MCV 88.3   < > 85.4 86.3 87.5 86.4 86.6  PLT 559*   < > 457* 479* 536* 443* 502*   < > = values in this interval not displayed.    Basic Metabolic Panel: Recent Labs  Lab 04/02/24 0442 04/03/24 0331 04/04/24 0258 04/05/24 0313 04/06/24 0322 04/07/24 0400 04/08/24 0500  NA 139 134* 133* 135 138 136 138  K 4.1 4.2 3.8 4.3 4.4 3.5 3.8  CL 101 101 100 101 102 102 106  CO2 25 23 23 26 24 24 24   GLUCOSE 109* 112* 133* 103* 107* 102* 130*  BUN <5* 10 9 11 7 10  <5*  CREATININE 0.75 0.76 0.74 0.78 0.69 0.76 0.63  CALCIUM  9.2 8.5* 8.6* 8.6* 9.3 8.8* 9.1  MG 2.0 1.8 1.7 2.0 2.1 2.0 2.1  PHOS 3.7 4.3  --   --   --   --   --     GFR: Estimated Creatinine Clearance: 101.9 mL/min (by C-G formula based on SCr of 0.63 mg/dL).  Liver Function Tests: Recent Labs  Lab 04/01/24 1544  AST 13*  ALT 11  ALKPHOS 47  BILITOT 0.2  PROT 7.1  ALBUMIN  3.2*    CBG: No results for input(s): GLUCAP in the last 168 hours.   Recent Results (from the past 240 hours)  Resp panel by RT-PCR (RSV, Flu Armida Vickroy&B, Covid) Anterior Nasal Swab     Status: None   Collection Time: 04/01/24  4:04 PM   Specimen: Anterior Nasal Swab  Result Value Ref Range Status   SARS Coronavirus 2 by RT PCR NEGATIVE NEGATIVE Final   Influenza Justan Gaede by PCR NEGATIVE NEGATIVE Final   Influenza B by PCR NEGATIVE NEGATIVE Final    Comment: (NOTE) The Xpert Xpress SARS-CoV-2/FLU/RSV plus assay is intended as  an aid in the diagnosis of influenza from Nasopharyngeal swab specimens and should not be used as Brelynn Wheller sole basis for treatment. Nasal washings and aspirates are unacceptable for Xpert Xpress SARS-CoV-2/FLU/RSV testing.  Fact Sheet for Patients: BloggerCourse.com  Fact Sheet for Healthcare Providers: SeriousBroker.it  This test is not yet approved or cleared by the [place]  FDA and has been authorized for detection and/or diagnosis of SARS-CoV-2 by FDA under an  Emergency Use Authorization (EUA). This EUA will remain in effect (meaning this test can be used) for the duration of the COVID-19 declaration under Section 564(b)(1) of the Act, 21 U.S.C. section 360bbb-3(b)(1), unless the authorization is terminated or revoked.     Resp Syncytial Virus by PCR NEGATIVE NEGATIVE Final    Comment: (NOTE) Fact Sheet for Patients: BloggerCourse.com  Fact Sheet for Healthcare Providers: SeriousBroker.it  This test is not yet approved or cleared by the [place]  FDA and has been authorized for detection and/or diagnosis of SARS-CoV-2 by FDA under an Emergency Use Authorization (EUA). This EUA will remain in effect (meaning this test can be used) for the duration of the COVID-19 declaration under Section 564(b)(1) of the Act, 21 U.S.C. section 360bbb-3(b)(1), unless the authorization is terminated or revoked.  Performed at [facility], [address] [place], [place] [address]   Blood Culture (routine x 2)     Status: None   Collection Time: 04/01/24  4:04 PM   Specimen: BLOOD  Result Value Ref Range Status   Specimen Description BLOOD RIGHT ANTECUBITAL  Final   Special Requests   Final    BOTTLES DRAWN AEROBIC AND ANAEROBIC Blood Culture results may not be optimal due to an inadequate volume of blood received in culture bottles   Culture   Final    NO GROWTH 5  DAYS Performed at [facility], [address], [place], [place] [address]    Report Status 04/06/2024 FINAL  Final  Blood Culture (routine x 2)     Status: None   Collection Time: 04/01/24  4:04 PM   Specimen: BLOOD  Result Value Ref Range Status   Specimen Description BLOOD LEFT ANTECUBITAL  Final   Special Requests   Final    BOTTLES DRAWN AEROBIC AND ANAEROBIC Blood Culture adequate volume   Culture   Final    NO GROWTH 5 DAYS Performed at [facility], [address] [place], [place] [address]    Report Status 04/06/2024 FINAL  Final  CSF culture w Gram Stain     Status: None (Preliminary result)   Collection Time: 04/02/24  4:35 PM   Specimen: Kewanna Kasprzak: PATH Cytology CSF; Cerebrospinal Fluid   B: PATH Cytology CSF; Cerebrospinal Fluid  Result Value Ref Range Status   Specimen Description CSF  Final   Special Requests NONE  Final   Gram Stain   Final    ABUNDANT WBC PRESENT,BOTH PMN AND MONONUCLEAR NO ORGANISMS SEEN    Culture   Final    NO GROWTH 6 DAYS CONTINUING TO HOLD Performed at [facility], [address], [place], [place] [address]    Report Status PENDING  Incomplete         Radiology Studies: No results found.        Scheduled Meds:  acetaminophen   1,000 mg Oral Q8H   atenolol   50 mg Oral Daily   Chlorhexidine  Gluconate Cloth  6 each Topical Daily   docusate sodium   100 mg Oral BID   gabapentin   400 mg Oral TID   methocarbamol   500 mg Oral TID   polyethylene glycol  17 g Oral BID   sodium chloride  flush  10-40 mL Intracatheter Q12H   sodium chloride  flush  3 mL Intravenous Q12H   Continuous Infusions:  ceFEPime  (MAXIPIME ) IV 2 g (04/08/24 0823)   vancomycin  1,000 mg (04/08/24 1330)     LOS: 6 days    Time  spent: over 30 min     Meliton Monte, MD [facility]   To contact the attending provider between 7A-7P or the covering provider during after hours 7P-7A, please log into the web site [email or web address] and  access using universal [place] password for that web site. If you do not have the password, please call the hospital operator.  04/08/2024, 2:08 PM

## 2024-04-09 ENCOUNTER — Other Ambulatory Visit (HOSPITAL_BASED_OUTPATIENT_CLINIC_OR_DEPARTMENT_OTHER): Payer: Self-pay

## 2024-04-09 ENCOUNTER — Encounter (HOSPITAL_COMMUNITY): Payer: Self-pay | Admitting: Physical Medicine and Rehabilitation

## 2024-04-09 DIAGNOSIS — D62 Acute posthemorrhagic anemia: Secondary | ICD-10-CM

## 2024-04-09 DIAGNOSIS — G039 Meningitis, unspecified: Secondary | ICD-10-CM | POA: Diagnosis not present

## 2024-04-09 DIAGNOSIS — E876 Hypokalemia: Secondary | ICD-10-CM

## 2024-04-09 DIAGNOSIS — R197 Diarrhea, unspecified: Secondary | ICD-10-CM | POA: Diagnosis not present

## 2024-04-09 LAB — CBC WITH DIFFERENTIAL/PLATELET
Abs Immature Granulocytes: 0.02 K/uL (ref 0.00–0.07)
Basophils Absolute: 0 K/uL (ref 0.0–0.1)
Basophils Relative: 0 %
Eosinophils Absolute: 0.3 K/uL (ref 0.0–0.5)
Eosinophils Relative: 6 %
HCT: 26.7 % — ABNORMAL LOW (ref 36.0–46.0)
Hemoglobin: 8.4 g/dL — ABNORMAL LOW (ref 12.0–15.0)
Immature Granulocytes: 0 %
Lymphocytes Relative: 27 %
Lymphs Abs: 1.3 K/uL (ref 0.7–4.0)
MCH: 27.7 pg (ref 26.0–34.0)
MCHC: 31.5 g/dL (ref 30.0–36.0)
MCV: 88.1 fL (ref 80.0–100.0)
Monocytes Absolute: 0.6 K/uL (ref 0.1–1.0)
Monocytes Relative: 13 %
Neutro Abs: 2.5 K/uL (ref 1.7–7.7)
Neutrophils Relative %: 54 %
Platelets: 559 K/uL — ABNORMAL HIGH (ref 150–400)
RBC: 3.03 MIL/uL — ABNORMAL LOW (ref 3.87–5.11)
RDW: 13 % (ref 11.5–15.5)
WBC: 4.8 K/uL (ref 4.0–10.5)
nRBC: 0 % (ref 0.0–0.2)

## 2024-04-09 LAB — COMPREHENSIVE METABOLIC PANEL WITH GFR
ALT: 16 U/L (ref 0–44)
AST: 18 U/L (ref 15–41)
Albumin: 2.9 g/dL — ABNORMAL LOW (ref 3.5–5.0)
Alkaline Phosphatase: 40 U/L (ref 38–126)
Anion gap: 8 (ref 5–15)
BUN: 6 mg/dL (ref 6–20)
CO2: 24 mmol/L (ref 22–32)
Calcium: 9.2 mg/dL (ref 8.9–10.3)
Chloride: 107 mmol/L (ref 98–111)
Creatinine, Ser: 0.68 mg/dL (ref 0.44–1.00)
GFR, Estimated: >60 mL/min (ref >60)
Glucose, Bld: 124 mg/dL — ABNORMAL HIGH (ref 70–99)
Potassium: 3.3 mmol/L — ABNORMAL LOW (ref 3.5–5.1)
Sodium: 139 mmol/L (ref 135–145)
Total Bilirubin: 0.4 mg/dL (ref 0.0–1.2)
Total Protein: 6.3 g/dL — ABNORMAL LOW (ref 6.5–8.1)

## 2024-04-09 LAB — HEMOGLOBIN A1C
Hgb A1c MFr Bld: 5.2 % (ref 4.8–5.6)
Mean Plasma Glucose: 102.54 mg/dL

## 2024-04-09 LAB — VANCOMYCIN, TROUGH: Vancomycin Tr: 19 ug/mL (ref 15–20)

## 2024-04-09 LAB — MAGNESIUM: Magnesium: 1.9 mg/dL (ref 1.7–2.4)

## 2024-04-09 MED ORDER — PROCHLORPERAZINE 25 MG RE SUPP: 12.5000 mg | Freq: Four times a day (QID) | RECTAL | Status: DC | PRN

## 2024-04-09 MED ORDER — CALCIUM POLYCARBOPHIL 625 MG PO TABS
625.0000 mg | ORAL_TABLET | Freq: Two times a day (BID) | ORAL | Status: DC
  Administered 2024-04-09 – 2024-04-15 (×13): 625 mg via ORAL
  Filled 2024-04-09 (×13): qty 1

## 2024-04-09 MED ORDER — SODIUM CHLORIDE 0.9% FLUSH: 10.0000 mL | INTRAVENOUS | Status: DC | PRN

## 2024-04-09 MED ORDER — GUAIFENESIN-DM 100-10 MG/5ML PO SYRP: 5.0000 mL | ORAL_SOLUTION | Freq: Four times a day (QID) | ORAL | Status: DC | PRN

## 2024-04-09 MED ORDER — SODIUM CHLORIDE 0.9% FLUSH
10.0000 mL | Freq: Two times a day (BID) | INTRAVENOUS | Status: DC
  Administered 2024-04-09 – 2024-04-14 (×8): 10 mL

## 2024-04-09 MED ORDER — FLEET ENEMA RE ENEM: 1.0000 | ENEMA | Freq: Once | RECTAL | Status: DC | PRN

## 2024-04-09 MED ORDER — POTASSIUM CHLORIDE CRYS ER 20 MEQ PO TBCR
30.0000 meq | EXTENDED_RELEASE_TABLET | Freq: Once | ORAL | Status: AC
  Administered 2024-04-09: 30 meq via ORAL

## 2024-04-09 MED ORDER — ACETAMINOPHEN 325 MG PO TABS
325.0000 mg | ORAL_TABLET | ORAL | Status: DC | PRN
  Administered 2024-04-10 – 2024-04-11 (×2): 650 mg via ORAL
  Filled 2024-04-09 (×2): qty 2

## 2024-04-09 MED ORDER — CHLORHEXIDINE GLUCONATE CLOTH 2 % EX PADS
6.0000 | MEDICATED_PAD | Freq: Two times a day (BID) | CUTANEOUS | Status: DC
  Administered 2024-04-09 – 2024-04-15 (×12): 6 via TOPICAL

## 2024-04-09 MED ORDER — DIPHENHYDRAMINE HCL 25 MG PO CAPS: 25.0000 mg | ORAL_CAPSULE | Freq: Four times a day (QID) | ORAL | Status: DC | PRN

## 2024-04-09 MED ORDER — BISACODYL 10 MG RE SUPP: 10.0000 mg | Freq: Every day | RECTAL | Status: DC | PRN

## 2024-04-09 MED ORDER — ALUM & MAG HYDROXIDE-SIMETH 200-200-20 MG/5ML PO SUSP: 30.0000 mL | ORAL | Status: DC | PRN

## 2024-04-09 MED ORDER — PROCHLORPERAZINE EDISYLATE 10 MG/2ML IJ SOLN: 5.0000 mg | Freq: Four times a day (QID) | INTRAMUSCULAR | Status: DC | PRN

## 2024-04-09 MED ORDER — PROCHLORPERAZINE MALEATE 5 MG PO TABS: 5.0000 mg | ORAL_TABLET | Freq: Four times a day (QID) | ORAL | Status: DC | PRN

## 2024-04-09 NOTE — Progress Notes (Signed)
 Inpatient Rehabilitation Admission Medication Review by a Pharmacist  A complete drug regimen review was completed for this patient to identify any potential clinically significant medication issues.  High Risk Drug Classes Is patient taking? Indication by Medication  Antipsychotic Yes, as an intravenous medication Prochlorperazine  - nausea, vomiting  Anticoagulant No   Antibiotic Yes, as an intravenous medication Vancomycin , cefepime - bacterial meningitis  Opioid Yes Oxycodone  - pain management  Antiplatelet No   Hypoglycemics/insulin No   Vasoactive Medication Yes Atenolol  - HTN  Chemotherapy No   Other Yes  Acetaminophen  - pain management Duonebs- wheezing, shortness of breath Fibercon, miralax , bisacodyl , fleet enema - bowel regimen, constipation Gabapentin  - neuropathic pain Melatonin - sleep  Methocarbamol  - muscle spasms     Type of Medication Issue Identified Description of Issue Recommendation(s)  Drug Interaction(s) (clinically significant)     Duplicate Therapy     Allergy     No Medication Administration End Date     Incorrect Dose     Additional Drug Therapy Needed     Significant med changes from prior encounter (inform family/care partners about these prior to discharge). PTA medications:  Hydrochlorothiazide  - plan to resume as outpatient.   Lexapro  was discontinued on [facility] encounter/ discharge.   New medications clinically significant):  Vancomycin , Cefepime , oxycodone ,  methocarbamol , duonebs.     Communicate to patient /family/ caregiver prior to discharge.   Other       Clinically significant medication issues were identified that warrant physician communication and completion of prescribed/recommended actions by midnight of the next day:  No  Name of provider notified for urgent issues identified:   Provider Method of Notification:   Pharmacist comments:   Time spent performing this drug regimen review (minutes):  20   Levorn Gaskins, RPh Clinical Pharmacist 04/09/2024 12:48 PM

## 2024-04-09 NOTE — Progress Notes (Addendum)
 PROGRESS NOTE   Subjective/Complaints: No new complaints or concerns this morning.  Reports occasional brief headache but does not last very long.  Reports she is getting stronger.  ROS: Patient denies fever, new vision changes, dizziness, nausea, vomiting,  shortness of breath or chest pain, headache, or mood change.  +Reports chronic constipation, had diarrhea after laxatives given recently but now doing better  Objective:   No results found. Recent Labs    04/08/24 0500 04/09/24 0835  WBC 4.8 4.8  HGB 8.5* 8.4*  HCT 26.5* 26.7*  PLT 502* 559*   Recent Labs    04/08/24 0500 04/09/24 0835  NA 138 139  K 3.8 3.3*  CL 106 107  CO2 24 24  GLUCOSE 130* 124*  BUN <5* 6  CREATININE 0.63 0.68  CALCIUM  9.1 9.2    Intake/Output Summary (Last 24 hours) at 04/09/2024 1454 Last data filed at 04/09/2024 1437 Gross per 24 hour  Intake 356 ml  Output --  Net 356 ml        Physical Exam: Vital Signs Blood pressure (!) 142/75, pulse 87, temperature 98.6 F (37 C), resp. rate 16, height 5' 8 (1.727 m), weight 102.6 kg, SpO2 100%.   General: No apparent distress HEENT:Large boggy area under incision left occiput. Staples and sutures in place on small incisions right scalp.  Heart: Reg rate and rhythm.  Chest: CTA bilaterally without wheezes, rales, or rhonchi; no distress Abdomen: Soft, non-tender, non-distended, bowel sounds positive.  Epigastric incision C/D/I  Extremities: Right lower extremity swelling Psych: Pt's affect is appropriate. Pt is cooperative Skin: Healing burn right infra clavicular area.   Neuro: Cranial nerves II through XII grossly intact, follows commands   Motor strength is 5/5 in upper extremities  4/5 bilateral lower extremities  Sensory exam is normal to light touch in the upper and lower limbs , mild hypersensitivity R plantar surface   No hypertonia noted  Assessment/Plan: 1.  Functional deficits which require 3+ hours per day of interdisciplinary therapy in a comprehensive inpatient rehab setting. Physiatrist is providing close team supervision and 24 hour management of active medical problems listed below. Physiatrist and rehab team continue to assess barriers to discharge/monitor patient progress toward functional and medical goals  Care Tool:  Bathing              Bathing assist Assist Level: Supervision/Verbal cueing     Upper Body Dressing/Undressing Upper body dressing        Upper body assist Assist Level: Supervision/Verbal cueing    Lower Body Dressing/Undressing Lower body dressing            Lower body assist Assist for lower body dressing: Supervision/Verbal cueing     Toileting Toileting    Toileting assist Assist for toileting: Supervision/Verbal cueing     Transfers Chair/bed transfer  Transfers assist     Chair/bed transfer assist level: Contact Guard/Touching assist     Locomotion Ambulation   Ambulation assist      Assist level: Minimal Assistance - Patient > 75% Assistive device: No Device Max distance: 180'   Walk 10 feet activity   Assist     Assist level: Contact  Guard/Touching assist Assistive device: No Device   Walk 50 feet activity   Assist    Assist level: Minimal Assistance - Patient > 75% Assistive device: No Device    Walk 150 feet activity   Assist    Assist level: Minimal Assistance - Patient > 75% Assistive device: No Device    Walk 10 feet on uneven surface  activity   Assist Walk 10 feet on uneven surfaces activity did not occur: Safety/medical concerns         Wheelchair     Assist Is the patient using a wheelchair?: No (uses as transport on unit)             Wheelchair 50 feet with 2 turns activity    Assist            Wheelchair 150 feet activity     Assist          Blood pressure (!) 142/75, pulse 87, temperature 98.6 F  (37 C), resp. rate 16, height 5' 8 (1.727 m), weight 102.6 kg, SpO2 100%.  Medical Problem List and Plan: 1. Functional deficits secondary to spinal meneingitis             -patient may  shower             -ELOS/Goals: 7-10d, Supervision  -Continue CIR  -Team conference today please see physician documentation under team conference tab, met with team  to discuss problems,progress, and goals. Formulized individual treatment plan based on medical history, underlying problem and comorbidities.   2.  Antithrombotics: -DVT/anticoagulation:  Mechanical: Sequential compression devices, below knee Bilateral lower extremities             -antiplatelet therapy: N/a 3. Pain Management:  Tylenol  1000 mg qid for 3 more days then prn.  --oxycodone  prn for severe pain.   4. Mood/Behavior/Sleep: LCSW to follow for evaluation and support.              -antipsychotic agents: N/A 5. Neuropsych/cognition: This patient is capable of making decisions on her own behalf. 6. Skin/Wound Care: Routine pressure relief measures.              --monitor for recurrent drainage from scalp or from back.       7. Fluids/Electrolytes/Nutrition: Monitor I/O. Check CMET in am. 8. CSF leak s/p VPS/with meningintis: Cultures without growth but to treat with 2 total weeks empiric antibiotics for meningitis             --Vanc (trough 15-20)/Cefepime  with EOT 04/14/24 --if relapse will need to have shunt removal.  9. Lumbosacral radiculopathy: R-foot paraesthesias-->on gabapentin  300 mg TID. 10. ABLA: Likely due to surgery/infection. Hgb 12.7 (July) --H/H ranging 7.5 to mid 8 range.              --recheck CBC in am.  Monitor for signs of infection.   - 9/9 Hemoglobin overall stable at 8.4 11. Fasting hyperglycemia: Will check A1C in am.  12.  RLE edema: TEDs and elevation. Dopplers 09/02 negative for DVT RLE. 13. Ependymoma?: Follow up with neurology for input.  14.  Diarrhea: Has had multiple stools for past couple of  days but decreasing in frequency.              --d/c laxatives (has been refusing them).              --monitor for c diff.   - 9/9 patient reports this is improving, continue to monitor 15.  Obesity class 1 w/BMI 34: Educate on diet and exercise.  16. Mood disorder: Stable off medications. 17.  Hypokalemia.   -9/9 30 mEq potassium today    LOS: 1 days A FACE TO FACE EVALUATION WAS PERFORMED  Murray Collier 04/09/2024, 2:54 PM

## 2024-04-09 NOTE — Progress Notes (Signed)
 Met with patient to review current situation, team conference and plan of care. Reviewed incision care, picc line care, sleep / pt on sleep chart, IV antibiotics, pain, bowel and bladder. Continue to follow along to provide education to facilitate preparation for discharge.

## 2024-04-09 NOTE — Progress Notes (Signed)
 Occupational Therapy Session Note  Patient Details  Name: Alexa [name] MRN: [id number] Date of Birth: 1971-04-08  Today's Date: 04/09/2024 OT Individual Time: 1435-1530 OT Individual Time Calculation (min): 55 min    Short Term Goals: Week 1:  OT Short Term Goal 1 (Week 1): STG=LTG d/t ELOS  Skilled Therapeutic Interventions/Progress Updates:  Pt greeted resting in bed, reports of intermediate pain in L-hip with mobility, rest provided as needed. Bed mobility with supervision + use of bed features. Pt ambulates from room<>day room, utilizing IV pole as RUE support initially progressing to no UE support. Pt requires cuing for step height/length, as patient drags feet with ambulation. In day room, pt instructed in BLE coordination activity with use of visual targets. Pt completes with CGA, activity upgraded to include cognitive demand by having patient answer simple math questions tying back to numbers on visual targets. Pt answers questions within a reasonable amount of time. Pt then performs 1x5 sit<>stand reps for BLE strengthening and endurance, unable to progress further due to L-hip pain. Therefore activity discontinued and OT instructs in figure-4 and hip abduction stretches. Pt remains resting in bed with all immediate needs met, call bell within reach, 4Ps assessed.   Therapy Documentation Precautions:  Precautions Precautions: Fall Recall of Precautions/Restrictions: Intact Precaution/Restrictions Comments: SBP 90-160 Restrictions Weight Bearing Restrictions Per Provider Order: No   Therapy/Group: Individual Therapy  Nereida Habermann, OTR/L, MSOT  04/09/2024, 2:36 PM

## 2024-04-09 NOTE — Progress Notes (Signed)
 Pharmacy Antibiotic Note  Alexa [name] is a 53 y.o. female for which pharmacy has been consulted for vancomycin  dosing for sepsis/meningitis r/o.  Patient with a history of recent meningioma resection w/ subsequent readmission with CSF leak from craniotomy site now is s/p lumbar drain on 8/18 and placement of a VP shunt on 8/24.   Patient presented with severe low back pain. Imaging concerning for likely developing epidural abscess / spinal meningitis.   04/09/24 update:  Vancomycin  trough =  19 mcg/ml, therapeutic trough/ within targeted trough goals of 15-20.   Plan: Continue vancomycin  1000 mg q8h ( goal trough 15-20) Continue cefepime  2g IV every 8 hours Monitor steady state Vancomycin  trough levels weekly. Monitor WBC, fever, renal function, cultures   Height: 5' 8 (172.7 cm) Weight: 102.6 kg (226 lb 3.1 oz) IBW/kg (Calculated) : 63.9  Temp (24hrs), Avg:97.9 F (36.6 C), Min:97.4 F (36.3 C), Max:98.6 F (37 C)  Recent Labs  Lab 04/04/24 0907 04/05/24 0313 04/06/24 0322 04/07/24 0400 04/08/24 0500 04/09/24 0835  WBC  --  7.1 6.4 5.5 4.8 4.8  CREATININE  --  0.78 0.69 0.76 0.63 0.68  VANCOTROUGH 17  --   --   --   --  19    Estimated Creatinine Clearance: 101.9 mL/min (by C-G formula based on SCr of 0.68 mg/dL).    Allergies  Allergen Reactions   Watermelon [Citrullus Vulgaris] Nausea And Vomiting   Microbiology results: 9/1 Blood cx = no growth/ final 9/1 Covid/flu neg 9/2 CSF cx =abundant WBC [PMN + MN] no organisms- cx- ngtd x 7 days, ,pending ( CSF WBC 1040 [seg 94, lymph 5, mono 1, no eos], glucose <20, protein 563, amber 540 rbc. Opening pressure 35.5cmh2o [brown tinged)   Thank you for allowing pharmacy to be a part of this patient's care.  Levorn Gaskins, RPh Clinical Pharmacist 04/09/2024 11:16 AM

## 2024-04-09 NOTE — Discharge Instructions (Addendum)
 Inpatient Rehab Discharge Instructions  Alexa [name] Discharge date and time:  04/15/24  Activities/Precautions/ Functional Status: Activity: no lifting, driving, or strenuous exercise  till cleared by MD Diet: regular diet Wound Care: keep wound clean and dry   Functional status:  ___ No restrictions     ___ Walk up steps independently ___ 24/7 supervision/assistance   ___ Walk up steps with assistance _X__ Intermittent supervision/assistance  _X__ Bathe/dress independently ___ Walk with walker     ___ Bathe/dress with assistance ___ Walk Independently    ___ Shower independently ___ Walk with assistance    ___ Shower with assistance _X__ No alcohol      ___ Return to work/school ________   Special Instructions:  COMMUNITY REFERRALS UPON DISCHARGE:     Outpatient: PT             Agency: [facility]   Phone: [phone]             Appointment Date/Time:*Please expect follow-up within 7-10 business days to schedule your appointment. If you have not received follow-up, be sure to contact the site directly.*    My questions have been answered and I understand these instructions. I will adhere to these goals and the provided educational materials after my discharge from the hospital.  Patient/Caregiver Signature _______________________________ Date __________  Clinician Signature _______________________________________ Date __________  Please bring this form and your medication list with you to all your follow-up doctor's appointments.

## 2024-04-09 NOTE — Evaluation (Signed)
 Physical Therapy Assessment and Plan  Patient Details  Name: Alexa [name] MRN: [id number] Date of Birth: Oct 20, 1970  PT Diagnosis: Abnormality of gait, Coordination disorder, Difficulty walking, and Muscle weakness Rehab Potential: Good ELOS: 7-10 days   Today's Date: 04/09/2024 PT Individual Time: 9159-9066 PT Individual Time Calculation (min): 53 min    Hospital Problem: Principal Problem:   Meningitis   Past Medical History:  Past Medical History:  Diagnosis Date   Anemia    Anxiety    Depression    GERD (gastroesophageal reflux disease)    Headache    Hypertension    Meningioma (HCC)    PONV (postoperative nausea and vomiting)    Past Surgical History:  Past Surgical History:  Procedure Laterality Date   APPLICATION OF CRANIAL NAVIGATION N/A 02/21/2024   Procedure: COMPUTER-ASSISTED NAVIGATION, FOR CRANIAL PROCEDURE;  Surgeon: Gillie Duncans, MD;  Location: [facility] OR;  Service: Neurosurgery;  Laterality: N/A;   CRANIOTOMY  05/26/2016   Procedure: SUBOCCIPITAL CRANIOTOMY WITH PLACEMENT OF VENTRICULAR CATHETER;  Surgeon: Duncans Gillie, MD;  Location: [facility] OR;  Service: Neurosurgery;;  CRANIOTOMY - SUBOCCIPITAL   CRANIOTOMY Left 02/21/2024   Procedure: CRANIOTOMY TUMOR EXCISION;  Surgeon: Gillie Duncans, MD;  Location: [facility] OR;  Service: Neurosurgery;  Laterality: Left;  Occipital - left Craniotomy for tumor resection with Stealth   CRANIOTOMY Left 02/26/2024   Procedure: CRANIOTOMY TUMOR EXCISION;  Surgeon: Gillie Duncans, MD;  Location: [facility] OR;  Service: Neurosurgery;  Laterality: Left;  Craniotomy for tumor Stage II   LUMBAR WOUND DEBRIDEMENT N/A 03/18/2024   Procedure: WOUND DEBRIDEMENT;  Surgeon: Gillie Duncans, MD;  Location: [facility] OR;  Service: Neurosurgery;  Laterality: N/A;  Wound revision at cranial site   PLACEMENT OF LUMBAR DRAIN N/A 03/18/2024   Procedure: PLACEMENT OF LUMBAR DRAIN;  Surgeon: Gillie Duncans, MD;  Location: [facility] OR;  Service: Neurosurgery;  Laterality: N/A;   Placement of Lumbar Drain   VENTRICULOPERITONEAL SHUNT Right 03/24/2024   Procedure: SHUNT INSERTION VENTRICULAR-PERITONEAL With Laparoscopic verification of shunt placement into the peritoneum;  Surgeon: Gillie Duncans, MD;  Location: [facility] OR;  Service: Neurosurgery;  Laterality: Right;   WISDOM TOOTH EXTRACTION      Assessment & Plan Clinical Impression: Patient is a 53 year old female with history of anemia, HTN, GERD, anxiety/depression, recurrent meningioma with headaches s/p resection 02/17/24 complicated by CSF leak 03/17/24  s/p shunt placement who was readmitted to [facility] on 04/01/24 with lumbar pain, fever and onset of right foot paraesthesias.  MRI lumbar spine showed a 10 mm X 11 mm  X 26 mm heterogeneously enhancing mass posterior to S2 vertebral body c/w appendicitis and ill defined mass lower lumbar spine and dural enhancement along lower cord concerning for infection. Dr. Vince felt ill defined dural enhancement concerning for meningitis.  LP done revealing WBC 1040,  segmented neurophils, glucose < 20 and TP 563. She was started on Vancomycin  and Cefepime  on 09/01 with Dr. Overton recommending 2 week course antibiotics for meningitis.  Gram stain negative and cultures pending. RLE noted to be edematous and dopplers done was negative for DVT in RLE.    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   CT A/P showed known enhancing mass posterior to S2 not well appreciated and VPS extending to RLQ with no significant free fluid.  She continued to have significant pain in lower back and BLE as well as significant amount  of fluid in wound and shunt has been dialed down to 60 mmH2O. Pain is improving but continues to have limitations in activity tolerance. She continues to have tingling right foot and very as high UOP. She requires mod assist with LB ADLs and CGA for mobility.  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  CIR recommended due to functional decline.  Patient currently requires min with mobility secondary to muscle weakness, decreased cardiorespiratoy endurance, decreased coordination, and decreased standing balance, decreased postural control, and decreased balance strategies.  Prior to hospitalization, patient was independent  with mobility and lived with Family in a House home.  Home access is  Level entry.  Patient will benefit from skilled PT intervention to maximize safe functional mobility, minimize fall risk, and decrease caregiver burden for planned discharge home with 24 hour supervision.  Anticipate patient will benefit from follow up HH at discharge.  PT - End of Session Activity Tolerance: Tolerates 30+ min activity without fatigue Endurance Deficit: Yes Endurance Deficit Description: rest breaks with mobility PT Assessment Rehab Potential (ACUTE/IP ONLY): Good PT Barriers to Discharge: Decreased caregiver support PT Barriers to Discharge Comments: lives with mother who can provide supervision only PT Patient demonstrates impairments in the following area(s): Balance;Endurance;Motor;Perception;Pain;Safety PT Transfers Functional Problem(s): Bed to Chair;Car;Bed Mobility;Furniture PT Locomotion Functional Problem(s): Ambulation;Stairs PT Plan PT Intensity: Minimum of 1-2 x/day ,45 to 90 minutes PT Frequency: 5 out of 7 days PT Duration Estimated Length of Stay: 7-10 days PT Treatment/Interventions: Ambulation/gait training;Discharge planning;Functional mobility training;Psychosocial support;Therapeutic Activities;Visual/perceptual remediation/compensation;Balance/vestibular training;Neuromuscular re-education;Therapeutic Exercise;DME/adaptive equipment instruction;Pain management;UE/LE Strength taining/ROM;Splinting/orthotics;Community reintegration;Patient/family education;Stair training;UE/LE Coordination activities PT  Transfers Anticipated Outcome(s): modI PT Locomotion Anticipated Outcome(s): modI short distances PT Recommendation Recommendations for Other Services: None Follow Up Recommendations: Home health PT Patient destination: Home Equipment Recommended: None recommended by PT   PT Evaluation Precautions/Restrictions Precautions Precautions: Fall Recall of Precautions/Restrictions: Intact Precaution/Restrictions Comments: SBP 90-160 Restrictions Weight Bearing Restrictions Per Provider Order: No Pain Interference Pain Interference Pain Effect on Sleep: 1. Rarely or not at all Pain Interference with Therapy Activities: 1. Rarely or not at all Pain Interference with Day-to-Day Activities: 1. Rarely or not at all Home Living/Prior Functioning Home Living Living Arrangements: Parent;Other relatives Available Help at Discharge: Family;Available 24 hours/day (lives with mom and sister) Type of Home: House Home Access: Level entry Home Layout: One level Bathroom Shower/Tub: Health visitor: Standard (BSC over toilet) Bathroom Accessibility: Yes Additional Comments: RW, W/C, cane  Lives With: Family Prior Function Level of Independence: Independent with basic ADLs;Independent with homemaking with ambulation;Independent with gait;Independent with transfers  Able to Take Stairs?: Yes Driving: Yes Vocation: Full time employment Vocation Requirements: works as Loss adjuster, 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 Vision/Perception  Vision - History Ability to See in Adequate Light: 0 Adequate Perception Perception: Within Functional Limits Praxis Praxis: WFL  Cognition Overall Cognitive Status: Within Functional Limits for tasks assessed Arousal/Alertness: Awake/alert Memory: Appears intact Awareness: Appears intact Problem Solving: Appears intact Safety/Judgment: Appears intact Comments: reports decreased mood Sensation Sensation Light Touch: Impaired by gross  assessment Additional Comments: Rt sided neuropathy, but medication has been helping per pt report Coordination Gross Motor Movements are Fluid and Coordinated: No Fine Motor Movements are Fluid and Coordinated: Yes Motor  Motor Motor: Other (comment) Motor - Skilled Clinical Observations: mild deficits secondary to deconditioning  Trunk/Postural Assessment  Cervical Assessment Cervical Assessment: Within Functional Limits Thoracic Assessment Thoracic Assessment: Within Functional  Limits Lumbar Assessment Lumbar Assessment: Within Functional Limits Postural Control Postural Control: Deficits on evaluation (delayed)  Balance Balance Balance Assessed: Yes Standardized Balance Assessment Standardized Balance Assessment: Timed Up and Go Test Timed Up and Go Test TUG: Normal TUG Normal TUG (seconds): 30.26 Static Sitting Balance Static Sitting - Balance Support: No upper extremity supported;Feet supported Static Sitting - Level of Assistance: 7: Independent Dynamic Sitting Balance Dynamic Sitting - Balance Support: Feet supported Dynamic Sitting - Level of Assistance: 6: Modified independent (Device/Increase time) Static Standing Balance Static Standing - Balance Support: During functional activity Static Standing - Level of Assistance: 5: Stand by assistance Dynamic Standing Balance Dynamic Standing - Balance Support: During functional activity Dynamic Standing - Level of Assistance: 4: Min assist Extremity Assessment  RUE Assessment RUE Assessment: Within Functional Limits LUE Assessment LUE Assessment: Within Functional Limits RLE Assessment RLE Assessment: Exceptions to [facility] General Strength Comments: grossly 4/5 tested in sitting LLE Assessment LLE Assessment: Exceptions to [facility] General Strength Comments: grossly 4+/5 tested in sitting  Care Tool Care Tool Bed Mobility Roll left and right activity   Roll left and right assist level: Supervision/Verbal cueing     Sit to lying activity   Sit to lying assist level: Supervision/Verbal cueing    Lying to sitting on side of bed activity   Lying to sitting on side of bed assist level: the ability to move from lying on the back to sitting on the side of the bed with no back support.: Supervision/Verbal cueing     Care Tool Transfers Sit to stand transfer   Sit to stand assist level: Contact Guard/Touching assist    Chair/bed transfer   Chair/bed transfer assist level: Contact Guard/Touching assist    Car transfer   Car transfer assist level: Contact Guard/Touching assist (simulated with bed/chair transfer)      Care Tool Locomotion Ambulation   Assist level: Minimal Assistance - Patient > 75% Assistive device: No Device Max distance: 180'  Walk 10 feet activity   Assist level: Contact Guard/Touching assist Assistive device: No Device   Walk 50 feet with 2 turns activity   Assist level: Minimal Assistance - Patient > 75% Assistive device: No Device  Walk 150 feet activity   Assist level: Minimal Assistance - Patient > 75% Assistive device: No Device  Walk 10 feet on uneven surfaces activity Walk 10 feet on uneven surfaces activity did not occur: Safety/medical concerns      Stairs   Assist level: Contact Guard/Touching assist Stairs assistive device: 2 hand rails Max number of stairs: 4  Walk up/down 1 step activity   Walk up/down 1 step (curb) assist level: Contact Guard/Touching assist Walk up/down 1 step or curb assistive device: 2 hand rails  Walk up/down 4 steps activity   Walk up/down 4 steps assist level: Contact Guard/Touching assist Walk up/down 4 steps assistive device: 2 hand rails  Walk up/down 12 steps activity Walk up/down 12 steps activity did not occur: Safety/medical concerns      Pick up small objects from floor Pick up small object from the floor (from standing position) activity did not occur: Safety/medical concerns      Wheelchair Is the patient using a  wheelchair?: No (uses as transport on unit)          Wheel 50 feet with 2 turns activity      Wheel 150 feet activity        Refer to Care Plan for Long Term Goals  SHORT TERM  GOAL WEEK 1 PT Short Term Goal 1 (Week 1): STG = LTG due to ELOS  Recommendations for other services: None   Skilled Therapeutic Intervention Evaluation completed (see details above and below) with education on PT POC and goals and individual treatment initiated with focus on therapeutic activities for transfer training, upright tolerance, and education as well as gait training for upright mobility and stair negotiation. Pt does not report pain at rest, reporting L hip pain with hip flexion noted with stair negotiation. Pt completes bed mobility with supervision, transfers with CGA without device. Pt transported to day room via [facility], completes gait without device 180' with CGA/min assist with one instance LOB with pt completing crossover step to steady self. Pt completes TUG in 30.26 seconds indicating increased risk for falls. Pt completes up/down 4 steps with BHRs with CGA demonstrating step to gait pattern however alternating BLE for ascending/descending. Pt returns to room and remains seated in [facility] with all needs within reach, cal light in place and chair alarm donned and activated at end of session.    Mobility Bed Mobility Bed Mobility: Sit to Supine;Supine to Sit Supine to Sit: Supervision/Verbal cueing Sit to Supine: Supervision/Verbal cueing Transfers Transfers: Stand Pivot Transfers;Stand to Sit;Sit to Stand Sit to Stand: Contact Guard/Touching assist Stand to Sit: Contact Guard/Touching assist Stand Pivot Transfers: Contact Guard/Touching assist Transfer (Assistive device): None Locomotion  Gait Ambulation: Yes Gait Assistance: Minimal Assistance - Patient > 75%;Contact Guard/Touching assist Assistive device: None Gait Gait: Yes Gait Pattern: Narrow base of support;Decreased stride length Gait  velocity: decreased Stairs / Additional Locomotion Stairs: Yes Stairs Assistance: Contact Guard/Touching assist Stair Management Technique: Two rails Number of Stairs: 4 Height of Stairs: 6 Wheelchair Mobility Wheelchair Mobility: No   Discharge Criteria: Patient will be discharged from PT if patient refuses treatment 3 consecutive times without medical reason, if treatment goals not met, if there is a change in medical status, if patient makes no progress towards goals or if patient is discharged from hospital.  The above assessment, treatment plan, treatment alternatives and goals were discussed and mutually agreed upon: by patient  Reche Ohara PT, DPT 04/09/2024, 1:53 PM

## 2024-04-09 NOTE — Progress Notes (Signed)
 Inpatient Rehabilitation  Patient information reviewed and entered into eRehab system by [facility]. Karen Kays., CCC/SLP, PPS Coordinator.  Information including medical coding, functional ability and quality indicators will be reviewed and updated through discharge.

## 2024-04-09 NOTE — Evaluation (Signed)
 Occupational Therapy Assessment and Plan  Patient Details  Name: Alexa [name] MRN: [id number] Date of Birth: 04-16-1971  OT Diagnosis: acute pain, lumbago (low back pain), and muscle weakness (generalized) Rehab Potential: Rehab Potential (ACUTE ONLY): Good ELOS: 7-10 days   Today's Date: 04/09/2024 OT Individual Time: 1021-1130 OT Individual Time Calculation (min): 69 min     Hospital Problem: Principal Problem:   Meningitis   Past Medical History:  Past Medical History:  Diagnosis Date   Anemia    Anxiety    Depression    GERD (gastroesophageal reflux disease)    Headache    Hypertension    Meningioma (HCC)    PONV (postoperative nausea and vomiting)    Past Surgical History:  Past Surgical History:  Procedure Laterality Date   APPLICATION OF CRANIAL NAVIGATION N/A 02/21/2024   Procedure: COMPUTER-ASSISTED NAVIGATION, FOR CRANIAL PROCEDURE;  Surgeon: Gillie Duncans, MD;  Location: [facility] OR;  Service: Neurosurgery;  Laterality: N/A;   CRANIOTOMY  05/26/2016   Procedure: SUBOCCIPITAL CRANIOTOMY WITH PLACEMENT OF VENTRICULAR CATHETER;  Surgeon: Duncans Gillie, MD;  Location: [facility] OR;  Service: Neurosurgery;;  CRANIOTOMY - SUBOCCIPITAL   CRANIOTOMY Left 02/21/2024   Procedure: CRANIOTOMY TUMOR EXCISION;  Surgeon: Gillie Duncans, MD;  Location: [facility] OR;  Service: Neurosurgery;  Laterality: Left;  Occipital - left Craniotomy for tumor resection with Stealth   CRANIOTOMY Left 02/26/2024   Procedure: CRANIOTOMY TUMOR EXCISION;  Surgeon: Gillie Duncans, MD;  Location: [facility] OR;  Service: Neurosurgery;  Laterality: Left;  Craniotomy for tumor Stage II   LUMBAR WOUND DEBRIDEMENT N/A 03/18/2024   Procedure: WOUND DEBRIDEMENT;  Surgeon: Gillie Duncans, MD;  Location: [facility] OR;  Service: Neurosurgery;  Laterality: N/A;  Wound revision at cranial site   PLACEMENT OF LUMBAR DRAIN N/A 03/18/2024   Procedure: PLACEMENT OF LUMBAR DRAIN;  Surgeon: Gillie Duncans, MD;  Location: [facility] OR;  Service: Neurosurgery;   Laterality: N/A;  Placement of Lumbar Drain   VENTRICULOPERITONEAL SHUNT Right 03/24/2024   Procedure: SHUNT INSERTION VENTRICULAR-PERITONEAL With Laparoscopic verification of shunt placement into the peritoneum;  Surgeon: Gillie Duncans, MD;  Location: [facility] OR;  Service: Neurosurgery;  Laterality: Right;   WISDOM TOOTH EXTRACTION      Assessment & Plan Clinical Impression:  Alexa [name] is a 53 year old female with history of anemia, HTN, GERD, anxiety/depression, recurrent meningioma with headaches s/p resection 02/17/24 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  MRI lumbar spine showed a 10 mm X 11 mm  X 26 mm heterogeneously enhancing mass posterior to S2 vertebral body c/w appendicitis and ill defined mass lower lumbar spine and dural enhancement along lower cord concerning for infection. Dr. Vince felt ill defined dural enhancement concerning for meningitis.  LP done revealing WBC 1040,  segmented neurophils, glucose < 20 and TP 563. She was started on Vancomycin  and Cefepime  on 09/01 with Dr. Overton recommending 2 week course antibiotics for meningitis.  Gram stain negative and cultures pending. RLE noted to be edematous and dopplers done was negative for DVT in RLE.    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   CT A/P showed known enhancing mass posterior to S2 not well appreciated and VPS extending to RLQ with no significant free fluid.  She continued to have significant pain in lower  back and BLE as well as significant amount of fluid in wound and shunt has been dialed down to 60 mmH2O. Pain is improving but continues to have limitations in activity tolerance. She continues to have tingling right foot and very as high UOP. She requires mod assist with LB ADLs and CGA for mobility.  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  CIR recommended due to functional decline..  Patient transferred to CIR on 04/08/2024 .    Patient currently requires CGA-SBA with basic self-care skills secondary to muscle weakness, decreased cardiorespiratoy endurance, and decreased standing balance, decreased postural control, and decreased balance strategies.  Prior to hospitalization, patient could complete ADLs with independent .  Patient will benefit from skilled intervention to decrease level of assist with basic self-care skills and increase independence with basic self-care skills prior to discharge home with care partner.  Anticipate patient will require intermittent supervision and follow up home health.  OT - End of Session Activity Tolerance: Tolerates 30+ min activity with multiple rests Endurance Deficit: Yes OT Assessment Rehab Potential (ACUTE ONLY): Good OT Barriers to Discharge: IV antibiotics OT Patient demonstrates impairments in the following area(s): Balance;Safety;Endurance;Motor;Pain OT Basic ADL's Functional Problem(s): Grooming;Bathing;Dressing;Toileting OT Advanced ADL's Functional Problem(s): None OT Transfers Functional Problem(s): Toilet OT Additional Impairment(s): None OT Plan OT Intensity: Minimum of 1-2 x/day, 45 to 90 minutes OT Frequency: 5 out of 7 days OT Duration/Estimated Length of Stay: 7-10 days OT Treatment/Interventions: Balance/vestibular training;Discharge planning;Pain management;Self Care/advanced ADL retraining;Therapeutic Activities;UE/LE Coordination activities;Cognitive remediation/compensation;Disease mangement/prevention;Functional mobility training;Therapeutic Exercise;Patient/family education;Community reintegration;DME/adaptive equipment instruction;Neuromuscular re-education;Psychosocial support;UE/LE Strength taining/ROM;Wheelchair propulsion/positioning OT Self Feeding Anticipated  Outcome(s): mod I OT Basic Self-Care Anticipated Outcome(s): mod I OT Toileting Anticipated Outcome(s): mod I OT Bathroom Transfers Anticipated Outcome(s): mod I OT Recommendation Recommendations for Other Services: Therapeutic Recreation consult Therapeutic Recreation Interventions: Pet therapy Patient destination: Home Follow Up Recommendations: Home health OT Equipment Recommended: None recommended by OT Equipment Details: owns all required equipment   OT Evaluation Precautions/Restrictions  Precautions Precautions: Fall Recall of Precautions/Restrictions: Intact Precaution/Restrictions Comments: SBP 90-160 Restrictions Weight Bearing Restrictions Per Provider Order: No General Chart Reviewed: Yes Response to Previous Treatment: Not applicable Family/Caregiver Present: No Pain Pain Assessment Pain Scale: 0-10 Pain Score: 0-No pain Home Living/Prior Functioning Home Living Family/patient expects to be discharged to:: Private residence Living Arrangements: Parent, Other relatives Available Help at Discharge: Family, Available 24 hours/day (lives with mom and sister) Type of Home: House Home Access: Level entry Home Layout: One level Bathroom Shower/Tub: Health visitor: Standard (BSC over toilet) Bathroom Accessibility: Yes Additional Comments: RW, W/C, cane  Lives With: Family IADL History Homemaking Responsibilities: Yes Meal Prep Responsibility: No Laundry Responsibility: Primary Cleaning Responsibility: Primary Bill Paying/Finance Responsibility: No Shopping Responsibility: No Child Care Responsibility: No Current License: Yes Occupation: Retired Prior Function Level of Independence: Independent with basic ADLs, Independent with homemaking with ambulation, Independent with gait, Independent with transfers  Able to Take Stairs?: Yes Driving: Yes Vocation: Full time employment Vocation Requirements: works as Loss adjuster, chartered for [employer] -  can work remotely Vision Baseline Vision/History: 1 Wears glasses Wears Glasses: At all times Ability to See in Adequate Light: 0 Adequate Patient Visual Report: No change from baseline Vision Assessment?: No apparent visual deficits Perception  Perception: Within Functional Limits Praxis Praxis: WFL Cognition Cognition Overall Cognitive Status: Within Functional Limits for tasks assessed Arousal/Alertness: Awake/alert Orientation Level: Person;Place;Situation Person: Oriented Place: Oriented Situation: Oriented Memory: Appears  intact Awareness: Appears intact Problem Solving: Appears intact Safety/Judgment: Appears intact Comments: reports decreased mood Brief Interview for Mental Status (BIMS) Repetition of Three Words (First Attempt): 3 Temporal Orientation: Year: Correct Temporal Orientation: Month: Accurate within 5 days Temporal Orientation: Day: Correct Recall: Sock: Yes, no cue required Recall: Blue: Yes, no cue required Recall: Bed: Yes, no cue required BIMS Summary Score: 15 Sensation Sensation Light Touch: Impaired by gross assessment Hot/Cold: Appears Intact Proprioception: Not tested Stereognosis: Not tested Additional Comments: Rt sided neuropathy, but medication has been helping per pt report Coordination Gross Motor Movements are Fluid and Coordinated: No Fine Motor Movements are Fluid and Coordinated: Yes Coordination and Movement Description: grossly uncoordinated secondary to deconditioning Finger Nose Finger Test: WNL Motor  Motor Motor: Other (comment) Motor - Skilled Clinical Observations: mild deficits secondary to deconditioning  Trunk/Postural Assessment  Cervical Assessment Cervical Assessment: Within Functional Limits Thoracic Assessment Thoracic Assessment: Within Functional Limits Lumbar Assessment Lumbar Assessment: Within Functional Limits Postural Control Postural Control: Deficits on evaluation (delayed)   Balance Balance Balance Assessed: Yes Static Sitting Balance Static Sitting - Balance Support: No upper extremity supported;Feet supported Static Sitting - Level of Assistance: 7: Independent Dynamic Sitting Balance Dynamic Sitting - Balance Support: Feet supported Dynamic Sitting - Level of Assistance: 6: Modified independent (Device/Increase time) Dynamic Sitting - Balance Activities: Reaching for objects Static Standing Balance Static Standing - Balance Support: During functional activity Static Standing - Level of Assistance: 5: Stand by assistance (SBA) Dynamic Standing Balance Dynamic Standing - Balance Support: During functional activity Dynamic Standing - Level of Assistance: 4: Min assist (CGA) Dynamic Standing - Balance Activities: Reaching for objects Extremity/Trunk Assessment RUE Assessment RUE Assessment: Within Functional Limits LUE Assessment LUE Assessment: Within Functional Limits  Care Tool Care Tool Self Care Eating   Eating Assist Level: Set up assist    Oral Care    Oral Care Assist Level: Set up assist    Bathing         Assist Level: Supervision/Verbal cueing    Upper Body Dressing(including orthotics)       Assist Level: Supervision/Verbal cueing    Lower Body Dressing (excluding footwear)     Assist for lower body dressing: Supervision/Verbal cueing    Putting on/Taking off footwear   What is the patient wearing?: Socks;Shoes Assist for footwear: Supervision/Verbal cueing       Care Tool Toileting Toileting activity   Assist for toileting: Supervision/Verbal cueing     Care Tool Bed Mobility Roll left and right activity   Roll left and right assist level: Supervision/Verbal cueing    Sit to lying activity   Sit to lying assist level: Supervision/Verbal cueing    Lying to sitting on side of bed activity   Lying to sitting on side of bed assist level: the ability to move from lying on the back to sitting on the side of the bed  with no back support.: Supervision/Verbal cueing     Care Tool Transfers Sit to stand transfer   Sit to stand assist level: Supervision/Verbal cueing    Chair/bed transfer   Chair/bed transfer assist level: Supervision/Verbal cueing     Toilet transfer   Assist Level: Supervision/Verbal cueing     Care Tool Cognition  Expression of Ideas and Wants Expression of Ideas and Wants: 4. Without difficulty (complex and basic) - expresses complex messages without difficulty and with speech that is clear and easy to understand  Understanding Verbal and Non-Verbal Content Understanding Verbal and Non-Verbal  Content: 4. Understands (complex and basic) - clear comprehension without cues or repetitions   Memory/Recall Ability Memory/Recall Ability : Current season;Location of own room;Staff names and faces;That he or she is in a hospital/hospital unit   Refer to Care Plan for Long Term Goals  SHORT TERM GOAL WEEK 1 OT Short Term Goal 1 (Week 1): STG=LTG d/t ELOS  Recommendations for other services: Therapeutic Recreation  Stress management   Skilled Therapeutic Intervention ADL ADL Equipment Provided: Long-handled sponge Eating: Set up Grooming: Supervision/safety (standing at sink) Where Assessed-Grooming: Standing at sink Upper Body Bathing: Supervision/safety Where Assessed-Upper Body Bathing: Shower Lower Body Bathing: Supervision/safety Where Assessed-Lower Body Bathing: Shower Upper Body Dressing: Supervision/safety Where Assessed-Upper Body Dressing: Standing at sink Lower Body Dressing: Supervision/safety Where Assessed-Lower Body Dressing: Standing at sink (using grab bar for unilateral support while threading legs into pants) Toileting: Supervision/safety Where Assessed-Toileting: Toilet;Bedside Commode Toilet Transfer: Furniture conservator/restorer Method: Proofreader: Gaffer: Not assessed Tub/Shower Transfer Method:  Unable to assess Film/video editor: Contact guard (without RW) Film/video editor Method: Designer, industrial/product: Transfer tub bench;Grab bars ADL Comments: decreased endurance Mobility  Bed Mobility Bed Mobility: Sit to Supine;Supine to Sit Supine to Sit: Supervision/Verbal cueing Sit to Supine: Supervision/Verbal cueing Transfers Sit to Stand: Contact Guard/Touching assist Stand to Sit: Contact Guard/Touching assist  1:1 evaluation and treatment session initiated this date. OT roles, goals and purpose discussed with pt as well as therapy schedule. ADL completed this date with levels of assist listed above. Pt would benefit from skilled OT in IPR setting in order to maximize independence with ADLs upon D/C.    Discharge Criteria: Patient will be discharged from OT if patient refuses treatment 3 consecutive times without medical reason, if treatment goals not met, if there is a change in medical status, if patient makes no progress towards goals or if patient is discharged from hospital.  The above assessment, treatment plan, treatment alternatives and goals were discussed and mutually agreed upon: by patient  Camie Hoe, OTD, OTR/L 04/09/2024, 12:48 PM

## 2024-04-09 NOTE — Progress Notes (Signed)
 Inpatient Rehabilitation Care Coordinator Assessment and Plan Patient Details  Name: Alexa [name] MRN: [id number] Date of Birth: 08/17/1970  Today's Date: 04/09/2024  Hospital Problems: Principal Problem:   Meningitis  Past Medical History:  Past Medical History:  Diagnosis Date   Anemia    Anxiety    Depression    GERD (gastroesophageal reflux disease)    Headache    Hypertension    Meningioma (HCC)    PONV (postoperative nausea and vomiting)    Past Surgical History:  Past Surgical History:  Procedure Laterality Date   APPLICATION OF CRANIAL NAVIGATION N/A 02/21/2024   Procedure: COMPUTER-ASSISTED NAVIGATION, FOR CRANIAL PROCEDURE;  Surgeon: Gillie Duncans, MD;  Location: [facility] OR;  Service: Neurosurgery;  Laterality: N/A;   CRANIOTOMY  05/26/2016   Procedure: SUBOCCIPITAL CRANIOTOMY WITH PLACEMENT OF VENTRICULAR CATHETER;  Surgeon: Duncans Gillie, MD;  Location: [facility] OR;  Service: Neurosurgery;;  CRANIOTOMY - SUBOCCIPITAL   CRANIOTOMY Left 02/21/2024   Procedure: CRANIOTOMY TUMOR EXCISION;  Surgeon: Gillie Duncans, MD;  Location: [facility] OR;  Service: Neurosurgery;  Laterality: Left;  Occipital - left Craniotomy for tumor resection with Stealth   CRANIOTOMY Left 02/26/2024   Procedure: CRANIOTOMY TUMOR EXCISION;  Surgeon: Gillie Duncans, MD;  Location: [facility] OR;  Service: Neurosurgery;  Laterality: Left;  Craniotomy for tumor Stage II   LUMBAR WOUND DEBRIDEMENT N/A 03/18/2024   Procedure: WOUND DEBRIDEMENT;  Surgeon: Gillie Duncans, MD;  Location: [facility] OR;  Service: Neurosurgery;  Laterality: N/A;  Wound revision at cranial site   PLACEMENT OF LUMBAR DRAIN N/A 03/18/2024   Procedure: PLACEMENT OF LUMBAR DRAIN;  Surgeon: Gillie Duncans, MD;  Location: [facility] OR;  Service: Neurosurgery;  Laterality: N/A;  Placement of Lumbar Drain   VENTRICULOPERITONEAL SHUNT Right 03/24/2024   Procedure: SHUNT INSERTION VENTRICULAR-PERITONEAL With Laparoscopic verification of shunt placement into the peritoneum;   Surgeon: Gillie Duncans, MD;  Location: [facility] OR;  Service: Neurosurgery;  Laterality: Right;   WISDOM TOOTH EXTRACTION     Social History:  reports that she has never smoked. She has never used smokeless tobacco. She reports current alcohol  use. She reports that she does not use drugs.  Family / Support Systems Marital Status: Single Spouse/Significant Other: N/A Children: No children Other Supports: N/A Anticipated Caregiver: mother Ability/Limitations of Caregiver: Pt mother wil provide 24/7 supervision only. Pt sister Alexa [name] works 12hr shift and unable to assist. Caregiver Availability: 24/7 Family Dynamics: Pt and sister Alexa [name] live with their mother.  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; FMLA and STD completed  7/17 Education: LPN Health Literacy - How often do you need to have someone help you when you read instructions, pamphlets, or other written material from your doctor or pharmacy?: Never Writes: Yes Employment Status: Employed Name of Employer: [employer] Length of Employment: 8 (years) Return to Work Plans: TBD Marine scientist Issues: Denies Guardian/Conservator: Product manager on file: 1) Hospital doctor (sistdr) and  2) [place] (mother); no blood alternatives   Abuse/Neglect Abuse/Neglect Assessment Can Be Completed: Yes Physical Abuse: Denies Verbal Abuse: Denies Sexual Abuse: Denies Exploitation of patient/patient's resources: Denies Self-Neglect: Denies  Patient response to: Social Isolation - How often do you feel lonely or isolated from those around you?: Never  Emotional Status Pt's affect, behavior and adjustment status: Pt in good spirits at time of visit Recent Psychosocial Issues: Denies Psychiatric History: Hx of depression and anxiety; lexapro  and wellbutrin ; has psychiatrist who prescribes meds- next appt 9/15  @3 :30pm; has therapist in place  as well. Substance Abuse History: Denies  tobacco products  Patient / Family Perceptions, Expectations & Goals Pt/Family understanding of illness & functional limitations: Pt and family have a general understanding of care needs. Premorbid pt/family roles/activities: Independent Anticipated changes in roles/activities/participation: Assistance with ADLs/IADLs Pt/family expectations/goals: Pt would like to get stronger  [employer]: None Premorbid Home Care/DME Agencies: Other (Comment) ([facility]- RW, rollator, 3in1 BSC) Transportation available at discharge: TBD Is the patient able to respond to transportation needs?: Yes In the past 12 months, has lack of transportation kept you from medical appointments or from getting medications?: No In the past 12 months, has lack of transportation kept you from meetings, work, or from getting things needed for daily living?: No Resource referrals recommended: Neuropsychology  Discharge Planning Living Arrangements: Parent, Other relatives Support Systems: Parent, Other relatives Type of Residence: Private residence Insurance Resources: Media planner (specify) Financial Resources: Employment Surveyor, quantity Screen Referred: No Living Expenses: Lives with family Money Management: Family, Patient Does the patient have any problems obtaining your medications?: No Home Management: Everyone manages home care needs Patient/Family Preliminary Plans: No changes Care Coordinator Barriers to Discharge: Decreased caregiver support, Lack of/limited family support, Insurance for SNF coverage, IV antibiotics Care Coordinator Anticipated Follow Up Needs: HH/OP Expected length of stay: D/c 9/16  Clinical Impression SW met with pt and pt mother in room to introduce self, explain role, discuss discharge process, and updates from team conference with d/c  9/16. SW will explore when IV abx end. Pt is not a Cytogeneticist. No HCPOA. Pt is pleasant and adjusting as well as to be  expected.   *Per ID, IV abx end 9/14.  Alexa [name] A Alexa [name] 04/09/2024, 3:25 PM

## 2024-04-09 NOTE — Plan of Care (Signed)
  Problem: RH Balance Goal: LTG Patient will maintain dynamic standing balance (PT) Description: LTG:  Patient will maintain dynamic standing balance with assistance during mobility activities (PT) Flowsheets (Taken 04/09/2024 1649) LTG: Pt will maintain dynamic standing balance during mobility activities with:: Independent   Problem: Sit to Stand Goal: LTG:  Patient will perform sit to stand with assistance level (PT) Description: LTG:  Patient will perform sit to stand with assistance level (PT) Flowsheets (Taken 04/09/2024 1649) LTG: PT will perform sit to stand in preparation for functional mobility with assistance level: Independent   Problem: RH Bed Mobility Goal: LTG Patient will perform bed mobility with assist (PT) Description: LTG: Patient will perform bed mobility with assistance, with/without cues (PT). Flowsheets (Taken 04/09/2024 1649) LTG: Pt will perform bed mobility with assistance level of: Independent   Problem: RH Bed to Chair Transfers Goal: LTG Patient will perform bed/chair transfers w/assist (PT) Description: LTG: Patient will perform bed to chair transfers with assistance (PT). Flowsheets (Taken 04/09/2024 1649) LTG: Pt will perform Bed to Chair Transfers with assistance level: Independent   Problem: RH Car Transfers Goal: LTG Patient will perform car transfers with assist (PT) Description: LTG: Patient will perform car transfers with assistance (PT). Flowsheets (Taken 04/09/2024 1649) LTG: Pt will perform car transfers with assist:: Independent with assistive device    Problem: RH Ambulation Goal: LTG Patient will ambulate in controlled environment (PT) Description: LTG: Patient will ambulate in a controlled environment, # of feet with assistance (PT). Flowsheets (Taken 04/09/2024 1649) LTG: Pt will ambulate in controlled environ  assist needed:: Independent with assistive device LTG: Ambulation distance in controlled environment: 150' Goal: LTG Patient will ambulate  in home environment (PT) Description: LTG: Patient will ambulate in home environment, # of feet with assistance (PT). Flowsheets (Taken 04/09/2024 1649) LTG: Pt will ambulate in home environ  assist needed:: Independent with assistive device LTG: Ambulation distance in home environment: 50'   Problem: RH Stairs Goal: LTG Patient will ambulate up and down stairs w/assist (PT) Description: LTG: Patient will ambulate up and down # of stairs with assistance (PT) Flowsheets (Taken 04/09/2024 1649) LTG: Pt will ambulate up/down stairs assist needed:: Supervision/Verbal cueing

## 2024-04-09 NOTE — Patient Care Conference (Signed)
 Inpatient RehabilitationTeam Conference and Plan of Care Update Date: 04/09/2024   Time: 1054 am    Patient Name: Alexa [name]      Medical Record Number: [id number]  Date of Birth: 11-Sep-1970 Sex: Female         Room/Bed: 4W21C/4W21C-01 Payor Info: Payor: [employer]OY[employer] / Plan: [place] [employer] PPO / Product Type: *No Product type* /    Admit Date/Time:  04/08/2024  8:15 PM  Primary Diagnosis:  Meningitis  Hospital Problems: Principal Problem:   Meningitis    Expected Discharge Date: Expected Discharge Date: 04/16/24  Team Members Present: Physician leading conference: Dr. Murray Collier Social Worker Present: Graeme Jude, LCSW Nurse Present: Eulalio Falls, RN PT Present: Elam Ohara, PT OT Present: Nereida Habermann, OT Other (Discipline and Name): Cara RN, AD     Current Status/Progress Goal Weekly Team Focus  Bowel/Bladder      Continent of bowel and bladder     Maintain continence of bowel and bladder     Assess bowel and bladder q shift  Swallow/Nutrition/ Hydration               ADL's   Eval pending   Eval pending   Eval pending    Mobility   supervision bed mob, transfers CGA, gait 180' CGA/min assist   modI  barriers: limited caregiver support, fatigue; focus on activity tolerance, gait training, NMR for dynamic standing balance    Communication                Safety/Cognition/ Behavioral Observations               Pain      No c/o pain    <4 w/ prns   Assess pain q shift   Skin      Incision head  Picc line   Skin free of infection entire stay on rehab    Assess skin q shift    Discharge Planning:  TBA    Team Discussion: Patient admitted post spinal meningitis. Patient limited by pain, poor endurance, lower extremity edema / weakness/ paresthesias.  Patient on target to meet rehab goals: Evals pending  *See Care Plan and progress notes for long and short-term goals.   Revisions to Treatment  Plan:  N/a   Teaching Needs: Safety, medications, incision care, toileting, transfers, etc.   Current Barriers to Discharge: Decreased caregiver support, Home enviroment access/layout, and IV antibiotics  Possible Resolutions to Barriers: Family Education      Medical Summary Current Status: spinal meneingitis, hypokalemia, abla, diarrhea  Barriers to Discharge: Electrolyte abnormality;Medical stability;Self-care education;Infection/IV Antibiotics  Barriers to Discharge Comments: spinal meneingitis, hypokalemia, abla, diarrhea Possible Resolutions to [employer] Focus: vanc/cefepime , monitor H/H, replete K+, monitor bowel function   Continued Need for Acute Rehabilitation Level of Care: The patient requires daily medical management by a physician with specialized training in physical medicine and rehabilitation for the following reasons: Direction of a multidisciplinary physical rehabilitation program to maximize functional independence : Yes Medical management of patient stability for increased activity during participation in an intensive rehabilitation regime.: Yes Analysis of laboratory values and/or radiology reports with any subsequent need for medication adjustment and/or medical intervention. : Yes   I attest that I was present, lead the team conference, and concur with the assessment and plan of the team.   Chae Oommen Gayo 04/09/2024, 1054 am

## 2024-04-09 NOTE — Plan of Care (Signed)
  Problem: RH Grooming Goal: LTG Patient will perform grooming w/assist,cues/equip (OT) Description: LTG: Patient will perform grooming with assist, with/without cues using equipment (OT) Flowsheets (Taken 04/09/2024 1253) LTG: Pt will perform grooming with assistance level of: Independent with assistive device    Problem: RH Bathing Goal: LTG Patient will bathe all body parts with assist levels (OT) Description: LTG: Patient will bathe all body parts with assist levels (OT) Flowsheets (Taken 04/09/2024 1253) LTG: Pt will perform bathing with assistance level/cueing: Independent with assistive device    Problem: RH Dressing Goal: LTG Patient will perform upper body dressing (OT) Description: LTG Patient will perform upper body dressing with assist, with/without cues (OT). Flowsheets (Taken 04/09/2024 1253) LTG: Pt will perform upper body dressing with assistance level of: Independent with assistive device Goal: LTG Patient will perform lower body dressing w/assist (OT) Description: LTG: Patient will perform lower body dressing with assist, with/without cues in positioning using equipment (OT) Flowsheets (Taken 04/09/2024 1253) LTG: Pt will perform lower body dressing with assistance level of: Independent with assistive device   Problem: RH Toileting Goal: LTG Patient will perform toileting task (3/3 steps) with assistance level (OT) Description: LTG: Patient will perform toileting task (3/3 steps) with assistance level (OT)  Flowsheets (Taken 04/09/2024 1253) LTG: Pt will perform toileting task (3/3 steps) with assistance level: Independent with assistive device   Problem: RH Toilet Transfers Goal: LTG Patient will perform toilet transfers w/assist (OT) Description: LTG: Patient will perform toilet transfers with assist, with/without cues using equipment (OT) Flowsheets (Taken 04/09/2024 1253) LTG: Pt will perform toilet transfers with assistance level of: Independent with assistive device    Problem: RH Tub/Shower Transfers Goal: LTG Patient will perform tub/shower transfers w/assist (OT) Description: LTG: Patient will perform tub/shower transfers with assist, with/without cues using equipment (OT) Flowsheets (Taken 04/09/2024 1253) LTG: Pt will perform tub/shower stall transfers with assistance level of: Independent with assistive device

## 2024-04-10 DIAGNOSIS — E876 Hypokalemia: Secondary | ICD-10-CM | POA: Diagnosis not present

## 2024-04-10 DIAGNOSIS — I10 Essential (primary) hypertension: Secondary | ICD-10-CM | POA: Diagnosis not present

## 2024-04-10 DIAGNOSIS — R197 Diarrhea, unspecified: Secondary | ICD-10-CM | POA: Diagnosis not present

## 2024-04-10 DIAGNOSIS — G039 Meningitis, unspecified: Secondary | ICD-10-CM | POA: Diagnosis not present

## 2024-04-10 LAB — MAGNESIUM: Magnesium: 1.9 mg/dL (ref 1.7–2.4)

## 2024-04-10 MED ORDER — POTASSIUM CHLORIDE CRYS ER 20 MEQ PO TBCR
20.0000 meq | EXTENDED_RELEASE_TABLET | Freq: Every day | ORAL | Status: AC
Start: 2024-04-12 — End: 2024-04-15
  Administered 2024-04-12 – 2024-04-15 (×4): 20 meq via ORAL
  Filled 2024-04-10 (×4): qty 1

## 2024-04-10 MED ORDER — HYDROCHLOROTHIAZIDE 12.5 MG PO TABS
12.5000 mg | ORAL_TABLET | Freq: Every day | ORAL | Status: DC
Start: 2024-04-10 — End: 2024-04-15
  Administered 2024-04-10 – 2024-04-15 (×6): 12.5 mg via ORAL
  Filled 2024-04-10 (×6): qty 1

## 2024-04-10 MED ORDER — POTASSIUM CHLORIDE CRYS ER 20 MEQ PO TBCR
20.0000 meq | EXTENDED_RELEASE_TABLET | Freq: Two times a day (BID) | ORAL | Status: AC
Start: 2024-04-10 — End: 2024-04-12
  Administered 2024-04-10 – 2024-04-11 (×4): 20 meq via ORAL
  Filled 2024-04-10 (×4): qty 1

## 2024-04-10 NOTE — Care Management (Signed)
 Inpatient Rehabilitation Center Individual Statement of Services  Patient Name:  Alexa [name]  Date:  04/10/2024  Welcome to the Inpatient Rehabilitation Center.  Our goal is to provide you with an individualized program based on your diagnosis and situation, designed to meet your specific needs.  With this comprehensive rehabilitation program, you will be expected to participate in at least 3 hours of rehabilitation therapies Monday-Friday, with modified therapy programming on the weekends.  Your rehabilitation program will include the following services:  Physical Therapy (PT), Occupational Therapy (OT), 24 hour per day rehabilitation nursing, Therapeutic Recreaction (TR), Psychology, Neuropsychology, Care Coordinator, Rehabilitation Medicine, Nutrition Services, Pharmacy Services, and Other  Weekly team conferences will be held on Tuesday to discuss your progress.  Your Inpatient Rehabilitation Care Coordinator will talk with you frequently to get your input and to update you on team discussions.  Team conferences with you and your family in attendance may also be held.  Expected length of stay: 7-10 days    Overall anticipated outcome: Independent  Depending on your progress and recovery, your program may change. Your Inpatient Rehabilitation Care Coordinator will coordinate services and will keep you informed of any changes. Your Inpatient Rehabilitation Care Coordinator's name and contact numbers are listed  below.  The following services may also be recommended but are not provided by the Inpatient Rehabilitation Center:  Driving Evaluations Home Health Rehabiltiation Services Outpatient Rehabilitation Services Vocational Rehabilitation   Arrangements will be made to provide these services after discharge if needed.  Arrangements include referral to agencies that provide these services.  Your insurance has been verified to be:  [employer]  Your primary doctor is:  No PCP  listed  Pertinent information will be shared with your doctor and your insurance company.  Inpatient Rehabilitation Care Coordinator:  Graeme Jude, KEN [phone] or [phone]  Information discussed with and copy given to patient by: Graeme DELENA Jude, 04/10/2024, 3:12 PM

## 2024-04-10 NOTE — Progress Notes (Signed)
 Physical Therapy Session Note  Patient Details  Name: CLYDIA NIEVES MRN: [id number] Date of Birth: 1971-01-21  Today's Date: 04/10/2024 PT Individual Time: 1500-1530 PT Individual Time Calculation (min): 30 min   Short Term Goals: Week 1:  PT Short Term Goal 1 (Week 1): STG = LTG due to ELOS  Skilled Therapeutic Interventions/Progress Updates:   Pt received asleep in recliner. Pt agreeable to therapy and presented w/ no c/o pain. Pt ambulated ~223ft to improve functional activity tolerance, finishing in day room for balance exercises. Pt required RW, CGA for safety. Pt given verbal cues to increase B foot clearance w/ good carryover.   Pt participated in several standing balance exercises, including eyes open/closed, compliant vs non-compliant surfaces, w/ and w/o BUE on RW, and finally perturbations. Pt demonstrated good balance reaction strategies in each situation. Pt then performed 8 sit to stand transfers w/ 1 LE extended, emphasizing single leg strength. 4 on each leg. At end of session, pt ambulated ~166ft w/ RW and CGA. Required verbal cue to pick feet up again.  Pt returned to seated EOB w/ tray table in front, call bell, and all other needs in reach. Bed alarm on.   Therapy Documentation Precautions:  Precautions Precautions: Fall Recall of Precautions/Restrictions: Intact Precaution/Restrictions Comments: SBP 90-160 Restrictions Weight Bearing Restrictions Per Provider Order: No   Therapy/Group: Individual Therapy  Oneil Grumbles 04/10/2024, 3:54 PM

## 2024-04-10 NOTE — Plan of Care (Signed)
  Problem: RH BOWEL ELIMINATION Goal: RH STG MANAGE BOWEL WITH ASSISTANCE Description: STG Manage Bowel with mod I- supervision Assistance. Outcome: Progressing   Problem: RH BLADDER ELIMINATION Goal: RH STG MANAGE BLADDER WITH ASSISTANCE Description: STG Manage Bladder With Mod I- supervision Assistance Outcome: Progressing   Problem: RH SKIN INTEGRITY Goal: RH STG SKIN FREE OF INFECTION/BREAKDOWN Description: Manage skin free of infection with supervision assistance Outcome: Progressing   Problem: RH SAFETY Goal: RH STG ADHERE TO SAFETY PRECAUTIONS W/ASSISTANCE/DEVICE Description: STG Adhere to Safety Precautions With supervision  Assistance/Device. Outcome: Progressing   Problem: RH PAIN MANAGEMENT Goal: RH STG PAIN MANAGED AT OR BELOW PT'S PAIN GOAL Description: <4 w/ prns Outcome: Progressing

## 2024-04-10 NOTE — Progress Notes (Signed)
 PROGRESS NOTE   Subjective/Complaints: BP noted to be elevated with therapy. She feels like strength is improving.   ROS: Patient denies fever, new vision changes, dizziness, nausea, vomiting,  shortness of breath or chest pain, headache, or mood change.  +Reports chronic constipation, had diarrhea after laxatives given recently but now improved  Objective:   No results found. Recent Labs    04/08/24 0500 04/09/24 0835  WBC 4.8 4.8  HGB 8.5* 8.4*  HCT 26.5* 26.7*  PLT 502* 559*   Recent Labs    04/08/24 0500 04/09/24 0835  NA 138 139  K 3.8 3.3*  CL 106 107  CO2 24 24  GLUCOSE 130* 124*  BUN <5* 6  CREATININE 0.63 0.68  CALCIUM  9.1 9.2    Intake/Output Summary (Last 24 hours) at 04/10/2024 1349 Last data filed at 04/10/2024 1300 Gross per 24 hour  Intake 706 ml  Output --  Net 706 ml        Physical Exam: Vital Signs Blood pressure 122/76, pulse 79, temperature 97.7 F (36.5 C), resp. rate 18, height 5' 8 (1.727 m), weight 102.6 kg, SpO2 100%.   General: No apparent distress, working with therapy HEENT:Large boggy area under incision left occiput. Staples and sutures in place on small incisions right scalp.  Heart: Reg rate and rhythm.  Chest: CTA bilaterally without wheezes, rales, or rhonchi; no distress Abdomen: Soft, non-tender, non-distended, bowel sounds positive.  Epigastric incision C/D/I  Extremities: Right lower extremity swelling Psych: Pt's affect is appropriate. Pt is cooperative Skin: Healing burn right infra clavicular area.   Neuro: Cranial nerves II through XII grossly intact, follows commands   Motor strength is 5/5 in upper extremities  4/5 bilateral lower extremities  Sensory exam is normal to light touch in the upper and lower limbs , mild hypersensitivity R plantar surface   No hypertonia noted  Prior neuro assessment is c/w today's exam  04/10/2024.   Assessment/Plan: 1. Functional deficits which require 3+ hours per day of interdisciplinary therapy in a comprehensive inpatient rehab setting. Physiatrist is providing close team supervision and 24 hour management of active medical problems listed below. Physiatrist and rehab team continue to assess barriers to discharge/monitor patient progress toward functional and medical goals  Care Tool:  Bathing              Bathing assist Assist Level: Supervision/Verbal cueing     Upper Body Dressing/Undressing Upper body dressing        Upper body assist Assist Level: Supervision/Verbal cueing    Lower Body Dressing/Undressing Lower body dressing            Lower body assist Assist for lower body dressing: Supervision/Verbal cueing     Toileting Toileting    Toileting assist Assist for toileting: Supervision/Verbal cueing     Transfers Chair/bed transfer  Transfers assist     Chair/bed transfer assist level: Contact Guard/Touching assist     Locomotion Ambulation   Ambulation assist      Assist level: Minimal Assistance - Patient > 75% Assistive device: No Device Max distance: 180'   Walk 10 feet activity   Assist     Assist level:  Contact Guard/Touching assist Assistive device: No Device   Walk 50 feet activity   Assist    Assist level: Minimal Assistance - Patient > 75% Assistive device: No Device    Walk 150 feet activity   Assist    Assist level: Minimal Assistance - Patient > 75% Assistive device: No Device    Walk 10 feet on uneven surface  activity   Assist Walk 10 feet on uneven surfaces activity did not occur: Safety/medical concerns         Wheelchair     Assist Is the patient using a wheelchair?: Yes (uses as transport on unit) Type of Wheelchair: Manual    Wheelchair assist level: Dependent - Patient 0%      Wheelchair 50 feet with 2 turns activity    Assist        Assist  Level: Dependent - Patient 0%   Wheelchair 150 feet activity     Assist      Assist Level: Dependent - Patient 0%   Blood pressure 122/76, pulse 79, temperature 97.7 F (36.5 C), resp. rate 18, height 5' 8 (1.727 m), weight 102.6 kg, SpO2 100%.  Medical Problem List and Plan: 1. Functional deficits secondary to spinal meneingitis             -patient may  shower             -ELOS/Goals: 7-10d, Supervision  -Continue CIR  -DC date expected 9/16  2.  Antithrombotics: -DVT/anticoagulation:  Mechanical: Sequential compression devices, below knee Bilateral lower extremities             -antiplatelet therapy: N/a 3. Pain Management:  Tylenol  1000 mg qid for 3 more days then prn.  --oxycodone  prn for severe pain.   4. Mood/Behavior/Sleep: LCSW to follow for evaluation and support.              -antipsychotic agents: N/A 5. Neuropsych/cognition: This patient is capable of making decisions on her own behalf. 6. Skin/Wound Care: Routine pressure relief measures.              --monitor for recurrent drainage from scalp or from back.       7. Fluids/Electrolytes/Nutrition: Monitor I/O. Check CMET in am. 8. CSF leak s/p VPS/with meningintis: Cultures without growth but to treat with 2 total weeks empiric antibiotics for meningitis             --Vanc (trough 15-20)/Cefepime  with EOT 04/14/24 --if relapse will need to have shunt removal.  9. Lumbosacral radiculopathy: R-foot paraesthesias-->on gabapentin  300 mg TID. 10. ABLA: Likely due to surgery/infection. Hgb 12.7 (July) --H/H ranging 7.5 to mid 8 range.              --recheck CBC in am.  Monitor for signs of infection.   - 9/9 Hemoglobin overall stable at 8.4 11. Fasting hyperglycemia: Will check A1C in am.  12.  RLE edema: TEDs and elevation. Dopplers 09/02 negative for DVT RLE. 13. Ependymoma?: Follow up with neurology for input.  14.  Diarrhea: Has had multiple stools for past couple of days but decreasing in frequency.               --d/c laxatives (has been refusing them).              --monitor for c diff.   - 9/9 patient reports this is improving, continue to monitor  -9/10 LBM documented 9/8, continue to monitor for now 15.  Obesity class 1 w/BMI  34: Educate on diet and exercise.  16. Mood disorder: Stable off medications. 17.  Hypokalemia.   -9/9 30 mEq potassium today -9/10 started for 2 days followed by daily, recheck tomorrow  18. HTN  -9/10 hydrochlorothiazide  started for elevated DBP with therapy    LOS: 2 days A FACE TO FACE EVALUATION WAS PERFORMED  Murray Collier 04/10/2024, 1:49 PM

## 2024-04-10 NOTE — Progress Notes (Addendum)
 Physical Therapy Session Note  Patient Details  Name: KAZUE CERRO MRN: [id number] Date of Birth: Oct 29, 1970  Today's Date: 04/10/2024 PT Individual Time: 9194-9152 + 1118-1200 PT Individual Time Calculation (min): 42 min + 42 min  Short Term Goals: Week 1:  PT Short Term Goal 1 (Week 1): STG = LTG due to ELOS  Skilled Therapeutic Interventions/Progress Updates:    SESSION 1: Pt presents in room in bed, motivated to participate with PT. Pt denies pain at this time but does report L hip pain with some movements throughout session, demonstrating pain with active ROM but not with PROM. Session focused on NMR for dynamic standing balance, BLE coordination, and BLE muscle fiber recruitment to promote decreased pain and balanced muscle activation for functional mobility. Pt completes bed mobility and transfers with supervision no device throughout session. Pt ambulates without device from room to day room ~150' close supervision/CGA. Pt comes to sitting on EOM for rest break. Pt then completes NMR for aforementioned target including: - step taps 4 step x10 BLE (pain L lateral hip) - lateral step taps 4 step x10 BLE (pain L groin) - multidirectional stepping x10 BLE each direction (forward, lateral, backward) - forward/backward monster walks 3x15' yellow band at ankles - lateral resisted stepping x6' yellow band at ankles Pt returns to room ambulating with CGA, and remains seated in recliner with all needs within reach, cal light in place at end of session.  Following gait ~150': BP 148/102. HR 97 Following exercise: BP 143/104 (116), HR 93   SESSION 2: Pt presents in room and agreeable to PT. Pt reporting soreness in L hip and back at start of session, MD notified and also informed of elevated diastolic BP. Session focused on therapeutic exercise to decrease pain and promote strength/ROM for BLE as well as NMR for dynamic standing balance, BLE coordination, and BLE muscle fiber  recruitment. Pt completes gait without device from room to day room, stopped for standing rest break with BUE support on nurse's station for talking with MD ~4 minutes. Pt comes to sitting in armless chair for rest break. Pt then completes therapeutic exercise with continuous training on nustep BUE/BLE for decreasing pain and improving BUE/BLE strength/ROM, x10 min on L4, completes 318 steps. Pt then completes gait to armless chair for seated rest break. Pt completes NMR for BLE strengthening and dynamic standing balance completing 3x10' resisted side stepping yellow band. Pt returns to room ambulating without device and remains seated in recliner with all needs within reach, cal light in place at end of session.   Prior to exercise: BP 134/98 (110), HR 70   Therapy Documentation Precautions:  Precautions Precautions: Fall Recall of Precautions/Restrictions: Intact Precaution/Restrictions Comments: SBP 90-160 Restrictions Weight Bearing Restrictions Per Provider Order: No General:      Therapy/Group: Individual Therapy  Reche Ohara PT, DPT 04/10/2024, 8:51 AM

## 2024-04-10 NOTE — Plan of Care (Signed)
  Problem: Consults Goal: RH GENERAL PATIENT EDUCATION Description: See Patient Education module for education specifics. Outcome: Progressing   Problem: RH BOWEL ELIMINATION Goal: RH STG MANAGE BOWEL WITH ASSISTANCE Description: STG Manage Bowel with mod I- supervision Assistance. Outcome: Progressing   Problem: RH BLADDER ELIMINATION Goal: RH STG MANAGE BLADDER WITH ASSISTANCE Description: STG Manage Bladder With Mod I- supervision Assistance Outcome: Progressing   Problem: RH SKIN INTEGRITY Goal: RH STG SKIN FREE OF INFECTION/BREAKDOWN Description: Manage skin free of infection with supervision assistance Outcome: Progressing   Problem: RH SAFETY Goal: RH STG ADHERE TO SAFETY PRECAUTIONS W/ASSISTANCE/DEVICE Description: STG Adhere to Safety Precautions With supervision  Assistance/Device. Outcome: Progressing   Problem: RH PAIN MANAGEMENT Goal: RH STG PAIN MANAGED AT OR BELOW PT'S PAIN GOAL Description: <4 w/ prns Outcome: Progressing

## 2024-04-10 NOTE — Progress Notes (Signed)
 Occupational Therapy Session Note  Patient Details  Name: Alexa [name] MRN: [id number] Date of Birth: May 15, 1971  Today's Date: 04/10/2024 OT Individual Time: 8994-8884 OT Individual Time Calculation (min): 70 min    Short Term Goals: Week 1:  OT Short Term Goal 1 (Week 1): STG=LTG d/t ELOS  Skilled Therapeutic Interventions/Progress Updates:  Pt greeted sitting in recliner for skilled OT session with focus on functional mobility, dynamic standing balance, and BLE strengthening/stretching for pain management.   Pain: Pt with L-anterior hip pain, OT offering intermediate rest breaks and positioning suggestions throughout session to address pain/fatigue and maximize participation/safety in session.   Functional Transfers: Sit<>stands and functional mobility with supervision + no AD.   Self Care Tasks: No needs voiced this session.   Therapeutic Activities: Pt instructed in dynamic standing balance activity, as patient stood on compliant surface (airex mat) while reaching outside BOS to retrieve/place horse-shoes. Close supervision provided. Activity upgraded to include stepping onto/off of mat, CGA provided intermediately due to lateral LOB, cuing provided for BLE placement upon stepping of off mat.   Therapeutic Exercise: Moist heat applied to L anterior hip in preparation for stretching/strengthening, patient appreciative. Pt instructed in the exercises below for BLE strengthening and hip pain management, details below: Supine hip abduction stretch Supine figure-4 stretch Supine hip flexion assisted by swiss-ball  Glute bridges SAQ  Pt completes 3x15 sec holds of stretches & 2x10 reps of strengthening exercises with multimodal cuing for correct form/posture.   Pt remained sitting in recliner with 4Ps assessed and immediate needs met. Pt continues to be appropriate for skilled OT intervention to promote further functional independence in ADLs/IADLs.   Therapy  Documentation Precautions:  Precautions Precautions: Fall Recall of Precautions/Restrictions: Intact Precaution/Restrictions Comments: SBP 90-160 Restrictions Weight Bearing Restrictions Per Provider Order: No   Therapy/Group: Individual Therapy  Nereida Habermann, OTR/L, MSOT  04/10/2024, 7:53 AM

## 2024-04-11 ENCOUNTER — Inpatient Hospital Stay (HOSPITAL_COMMUNITY)

## 2024-04-11 ENCOUNTER — Telehealth (HOSPITAL_COMMUNITY): Payer: Self-pay | Admitting: Psychiatry

## 2024-04-11 LAB — BASIC METABOLIC PANEL WITH GFR
Anion gap: 9 (ref 5–15)
BUN: 5 mg/dL — ABNORMAL LOW (ref 6–20)
CO2: 26 mmol/L (ref 22–32)
Calcium: 9.4 mg/dL (ref 8.9–10.3)
Chloride: 104 mmol/L (ref 98–111)
Creatinine, Ser: 0.81 mg/dL (ref 0.44–1.00)
GFR, Estimated: >60 mL/min
Glucose, Bld: 104 mg/dL — ABNORMAL HIGH (ref 70–99)
Potassium: 3.6 mmol/L (ref 3.5–5.1)
Sodium: 139 mmol/L (ref 135–145)

## 2024-04-11 LAB — CBC
HCT: 25.7 % — ABNORMAL LOW (ref 36.0–46.0)
Hemoglobin: 8.1 g/dL — ABNORMAL LOW (ref 12.0–15.0)
MCH: 27.7 pg (ref 26.0–34.0)
MCHC: 31.5 g/dL (ref 30.0–36.0)
MCV: 88 fL (ref 80.0–100.0)
Platelets: 455 K/uL — ABNORMAL HIGH (ref 150–400)
RBC: 2.92 MIL/uL — ABNORMAL LOW (ref 3.87–5.11)
RDW: 12.9 % (ref 11.5–15.5)
WBC: 3.3 K/uL — ABNORMAL LOW (ref 4.0–10.5)
nRBC: 0 % (ref 0.0–0.2)

## 2024-04-11 LAB — MAGNESIUM: Magnesium: 2 mg/dL (ref 1.7–2.4)

## 2024-04-11 MED ORDER — ACETAMINOPHEN 500 MG PO TABS
1000.0000 mg | ORAL_TABLET | Freq: Three times a day (TID) | ORAL | Status: DC
  Administered 2024-04-11 – 2024-04-15 (×13): 1000 mg via ORAL
  Filled 2024-04-11 (×13): qty 2

## 2024-04-11 NOTE — IPOC Note (Signed)
 Overall Plan of Care [facility]) Patient Details Name: Alexa [name] MRN: [id number] DOB: 1970-09-08  Admitting Diagnosis: Meningitis  Hospital Problems: Principal Problem:   Meningitis     Functional Problem List: Nursing Bladder, Bowel, Edema, Endurance, Medication Management, Pain, Safety, Sensory, Skin Integrity  PT Balance, Endurance, Motor, Perception, Pain, Safety  OT Balance, Safety, Endurance, Motor, Pain  SLP    TR         Basic ADL's: OT Grooming, Bathing, Dressing, Toileting     Advanced  ADL's: OT None     Transfers: PT Bed to Chair, Car, Bed Mobility, Oncologist: PT Ambulation, Stairs     Additional Impairments: OT None  SLP        TR      Anticipated Outcomes Item Anticipated Outcome  Self Feeding mod I  Swallowing      Basic self-care  mod I  Toileting  mod I   Bathroom Transfers mod I  Bowel/Bladder  manage bowels with medications/ manage bladder with toileting assistance  Transfers  modI  Locomotion  modI short distances  Communication     Cognition     Pain  <4 w/ prns  Safety/Judgment  manage safety with supervision assistance   Therapy Plan: PT Intensity: Minimum of 1-2 x/day ,45 to 90 minutes PT Frequency: 5 out of 7 days PT Duration Estimated Length of Stay: 7-10 days OT Intensity: Minimum of 1-2 x/day, 45 to 90 minutes OT Frequency: 5 out of 7 days OT Duration/Estimated Length of Stay: 7-10 days     Team Interventions: Nursing Interventions Patient/Family Education, Medication Management, Bladder Management, Bowel Management, Disease Management/Prevention, Pain Management, Discharge Planning, Skin Care/Wound Management  PT interventions Ambulation/gait training, Discharge planning, Functional mobility training, Psychosocial support, Therapeutic Activities, Visual/perceptual remediation/compensation, Balance/vestibular training, Neuromuscular re-education, Therapeutic Exercise, DME/adaptive  equipment instruction, Pain management, UE/LE Strength taining/ROM, Splinting/orthotics, Community reintegration, Equities trader education, Museum/gallery curator, UE/LE Coordination activities  OT Interventions Warden/ranger, Discharge planning, Pain management, Self Care/advanced ADL retraining, Therapeutic Activities, UE/LE Coordination activities, Cognitive remediation/compensation, Disease mangement/prevention, Functional mobility training, Therapeutic Exercise, Patient/family education, [employer] reintegration, Fish farm manager, Neuromuscular re-education, Psychosocial support, UE/LE Strength taining/ROM, Wheelchair propulsion/positioning  SLP Interventions    TR Interventions    SW/CM Interventions Discharge Planning, Psychosocial Support, Patient/Family Education   Barriers to Discharge MD  Medical stability, Home enviroment access/loayout, IV antibiotics, Lack of/limited family support, and Insurance for SNF coverage  Nursing Decreased caregiver support, Home environment access/layout, Incontinence    PT Decreased caregiver support lives with mother who can provide supervision only  OT IV antibiotics    SLP      SW Decreased caregiver support, Lack of/limited family support, Community education officer for SNF coverage, IV antibiotics     Team Discharge Planning: Destination: PT-Home ,OT- Home , SLP-  Projected Follow-up: PT-Home health PT, OT-  Home health OT, SLP-  Projected Equipment Needs: PT-None recommended by PT, OT- None recommended by OT, SLP-  Equipment Details: PT- , OT-owns all required equipment Patient/family involved in discharge planning: PT- Patient,  OT-Patient, SLP-   MD ELOS: 7-10 days Medical Rehab Prognosis:  Good Assessment: The patient has been admitted for CIR therapies with the diagnosis of spinal meningitis. The team will be addressing functional mobility, strength, stamina, balance, safety, adaptive techniques and equipment, self-care, bowel and  bladder mgt, patient and caregiver education. Goals have been set at supervision. Anticipated discharge destination is home.       See Team  Conference Notes for weekly updates to the plan of care

## 2024-04-11 NOTE — Telephone Encounter (Signed)
 Patient left message  X 2 regarding rescheduling 04/15/2024 appointment. States she is in hospital and should be discharged 04/16/2024. Called patient but no answer and voicemail did not engage. Sending MyChart message.

## 2024-04-11 NOTE — Progress Notes (Signed)
 Occupational Therapy Session Note  Patient Details  Name: Alexa [name] MRN: [id number] Date of Birth: 11-Jun-1971  Today's Date: 04/11/2024 OT Individual Time: 8697-8644 OT Individual Time Calculation (min): 53 min    Short Term Goals: Week 1:  OT Short Term Goal 1 (Week 1): STG=LTG d/t ELOS  Skilled Therapeutic Interventions/Progress Updates:    Patient received seated in recliner chair reporting fatigue from morning therapies.   Patient agreeable to shower as she did not do that this morning.  Patient walks with supervision.  Patient gathered clean clothing and set up bathroom for shower with distant supervision.  Patient reporting fatigue.  She is not experiencing hip pain this session, but reports anterior left hip pain - at hip crease - during therapy earlier today.  She indicated that an xray was ordered.  Patient able to gather all needed items for grooming at sink with supervision.  Discussed plans for return to work.  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  She plans to take a few weeks off after discharge prior to returning to the office.  She does mostly computer work.  She indicates work group has been very supportive.  Discussed fatigue factor for work and recommendation to ease into full time schedule as allowed.  Patient left up in recliner at end of session with safety pad in place and engaged and call bell/ personal items in reach.    Therapy Documentation Precautions:  Precautions Precautions: Fall Recall of Precautions/Restrictions: Intact Precaution/Restrictions Comments: SBP 90-160 Restrictions Weight Bearing Restrictions Per Provider Order: No   Pain:  Denies pain     Therapy/Group: Individual Therapy  Rebbeca [name] 04/11/2024, 1:54 PM

## 2024-04-11 NOTE — Progress Notes (Signed)
 Sutures and staples removed per MD order. No drainage noted on head posterior incision. Steri strip placed. Patient tolerated well.

## 2024-04-11 NOTE — Progress Notes (Signed)
 Occupational Therapy Session Note  Patient Details  Name: Alexa [name] MRN: [id number] Date of Birth: [birth year]  Today's Date: 04/11/2024 OT Individual Time: 9149-9054 OT Individual Time Calculation (min): 55 min    Short Term Goals: Week 1:  OT Short Term Goal 1 (Week 1): STG=LTG d/t ELOS  Skilled Therapeutic Interventions/Progress Updates:  Pt greeted resting in bed for skilled OT session with focus on functional mobility, dynamic standing balance, and cognitive retraining.   Pain: Pt with intermediate reports of L-hip pain. OT offering intermediate rest breaks and positioning suggestions throughout session to address pain/fatigue and maximize participation/safety in session.   Functional Transfers: Sit<>stands and household level ambulation with supervision + no AD.   Self Care Tasks: No needs voiced this session.   Therapeutic Activities: Pt ambulates greater than household distance while carrying unsteady item (paddle with x2 golf balls), pt completes at supervision level with no LOB, demo's improved reaction time with external challenge.   Pt engaged in reaction time activity, as patient tasked with catching ping-pong balls as they were dropped by OT through a basketball hoop. Pt averages 4/6 ping pong balls, able to retrieve balls from floor with CGA, supervision for remainder of task.  Session then shifted to dual task activity as patient ambulates in hallway while tossing a ball and answering mildly complex mathematical questions. Pt requires more than reasonable amount of time to answer double digit computations, observed to also stop tossing ball intermediately with the above. Close supervision throughout for mobility, Min cuing to recall problems with environmental distractions.   Therapeutic Exercise: Pt instructed in 3x11min of step ups onto 6 in step with no UE support at supervision level for endurance training and BLE strengthening.   Pt remained sitting in recliner  with 4Ps assessed and immediate needs met. Pt continues to be appropriate for skilled OT intervention to promote further functional independence in ADLs/IADLs.   Therapy Documentation Precautions:  Precautions Precautions: Fall Recall of Precautions/Restrictions: Intact Precaution/Restrictions Comments: SBP 90-160 Restrictions Weight Bearing Restrictions Per Provider Order: No   Therapy/Group: Individual Therapy  Nereida Habermann, OTR/L, MSOT  04/11/2024, 7:39 AM

## 2024-04-11 NOTE — Progress Notes (Addendum)
 PROGRESS NOTE   Subjective/Complaints: No events overnight.  Complaining about some left hip pain with mobilization.  States she has no history of OA, but significant family history of pain requiring multiple joint replacements. Vitals stable     04/11/2024    5:33 AM 04/10/2024    8:29 PM 04/10/2024    1:40 PM  Vitals with BMI  Systolic 131 131 877  Diastolic 76 72 76  Pulse 77 77 79    No results for input(s): GLUCAP in the last 72 hours.   P.o. intakes appropriate  Last BM 9/8??--Patient in agreement with this, says that she has had some incontinence, also urinary urgency  leading to frequent incontinence.  No dysuria, abdominal pain, or fevers.   ROS: Patient denies fever, new vision changes, dizziness, nausea, vomiting,  shortness of breath or chest pain, headache, or mood change.  +Reports chronic constipation, had diarrhea after laxatives given recently but now improved + Left hip pain  Objective:   No results found. Recent Labs    04/09/24 0835 04/11/24 0400  WBC 4.8 3.3*  HGB 8.4* 8.1*  HCT 26.7* 25.7*  PLT 559* 455*   Recent Labs    04/09/24 0835 04/11/24 0400  NA 139 139  K 3.3* 3.6  CL 107 104  CO2 24 26  GLUCOSE 124* 104*  BUN 6 5*  CREATININE 0.68 0.81  CALCIUM  9.2 9.4    Intake/Output Summary (Last 24 hours) at 04/11/2024 0944 Last data filed at 04/11/2024 0809 Gross per 24 hour  Intake 717 ml  Output --  Net 717 ml        Physical Exam: Vital Signs Blood pressure 131/76, pulse 77, temperature 98.2 F (36.8 C), resp. rate 20, height 5' 8 (1.727 m), weight 102.6 kg, SpO2 96%.   General: No apparent distress, sitting upright in bedside chair. HEENT:  Large fluctuant fluid area under incision left occiput.  Sutures intact, well-approximated. Staples over small incisions on the right scalp-, dry, intact.  Heart: Reg rate and rhythm.  Chest: CTA bilaterally without wheezes,  rales, or rhonchi; no distress Abdomen: Soft, non-tender, non-distended, bowel sounds positive.  Epigastric incision C/D/I  Extremities: Right lower extremity swelling Psych: Pt's affect is appropriate. Pt is cooperative Skin: Healing burn right infra clavicular area--stable from prior exams Neuro: Cranial nerves II through XII grossly intact, follows commands   Motor strength is 5/5 in upper extremities  4+/5 bilateral lower extremities  Sensory exam is normal to light touch in the upper and lower limbs , mild hypersensitivity R plantar surface--not appreciated today  No hypertonia noted    Assessment/Plan: 1. Functional deficits which require 3+ hours per day of interdisciplinary therapy in a comprehensive inpatient rehab setting. Physiatrist is providing close team supervision and 24 hour management of active medical problems listed below. Physiatrist and rehab team continue to assess barriers to discharge/monitor patient progress toward functional and medical goals  Care Tool:  Bathing              Bathing assist Assist Level: Supervision/Verbal cueing     Upper Body Dressing/Undressing Upper body dressing        Upper body assist Assist  Level: Supervision/Verbal cueing    Lower Body Dressing/Undressing Lower body dressing            Lower body assist Assist for lower body dressing: Supervision/Verbal cueing     Toileting Toileting    Toileting assist Assist for toileting: Supervision/Verbal cueing     Transfers Chair/bed transfer  Transfers assist     Chair/bed transfer assist level: Contact Guard/Touching assist     Locomotion Ambulation   Ambulation assist      Assist level: Minimal Assistance - Patient > 75% Assistive device: No Device Max distance: 180'   Walk 10 feet activity   Assist     Assist level: Contact Guard/Touching assist Assistive device: No Device   Walk 50 feet activity   Assist    Assist level: Minimal  Assistance - Patient > 75% Assistive device: No Device    Walk 150 feet activity   Assist    Assist level: Minimal Assistance - Patient > 75% Assistive device: No Device    Walk 10 feet on uneven surface  activity   Assist Walk 10 feet on uneven surfaces activity did not occur: Safety/medical concerns         Wheelchair     Assist Is the patient using a wheelchair?: Yes (uses as transport on unit) Type of Wheelchair: Manual    Wheelchair assist level: Dependent - Patient 0%      Wheelchair 50 feet with 2 turns activity    Assist        Assist Level: Dependent - Patient 0%   Wheelchair 150 feet activity     Assist      Assist Level: Dependent - Patient 0%   Blood pressure 131/76, pulse 77, temperature 98.2 F (36.8 C), resp. rate 20, height 5' 8 (1.727 m), weight 102.6 kg, SpO2 96%.  Medical Problem List and Plan: 1. Functional deficits secondary to spinal meneingitis             -patient may  shower             -ELOS/Goals: 7-10d, Supervision  -Continue CIR  -DC date expected 9/16  - Doing very well from rehab standpoint, supervision for mobility with complex balance and cognitive tasks.  2.  Antithrombotics: -DVT/anticoagulation:  Mechanical: Sequential compression devices, below knee Bilateral lower extremities             -antiplatelet therapy: N/a 3. Pain Management:  Tylenol  1000 mg qid for 3 more days then prn.  --oxycodone  prn for severe pain.   -9-11: Complaining of left hip pain; will get x-rays today.  Offered heating pad, patient refused.  Minimal use of PRNs.  4. Mood/Behavior/Sleep: LCSW to follow for evaluation and support.              -antipsychotic agents: N/A 5. Neuropsych/cognition: This patient is capable of making decisions on her own behalf. 6. Skin/Wound Care: Routine pressure relief measures.              --monitor for recurrent drainage from scalp or from back.   - 9-11: No further drainage, only mention of  suture/staple management was from 8-25 which said removal in 10 days.  Will inquire with Dr Gillie today; hopeful top remove R staples, cautious about left sutures given ongoing swelling and tension in the left occipital area.  - addendum Per Dr Gillie, can remove all staples/sutures, reviewed with nursing to steri-strip midline L incision before removal of suture to reduce tension  7. Fluids/Electrolytes/Nutrition: Monitor I/O. Check CMET in am. 8. CSF leak s/p VPS/with meningintis: Cultures without growth but to treat with 2 total weeks empiric antibiotics for meningitis             --Vanc (trough 15-20)/Cefepime  with EOT 04/14/24 --if relapse will need to have shunt removal.  9. Lumbosacral radiculopathy: R-foot paraesthesias-->on gabapentin  300 mg TID. 10. ABLA: Likely due to surgery/infection. Hgb 12.7 (July) --H/H ranging 7.5 to mid 8 range.              --recheck CBC in am.  Monitor for signs of infection.   - 9/9 Hemoglobin overall stable at 8.4  11. Fasting hyperglycemia: Will check A1C in am.   - HA1C 5.2, continue current regimen  12.  RLE edema: TEDs and elevation. Dopplers 09/02 negative for DVT RLE.  13. 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   - 10 x 11 x 26 mm heterogenous mass posterior to S2 seen on MRI 9/1; not well redemonstrated on CT   -asymptomatic  14.  Diarrhea: Has had multiple stools for past couple of days but decreasing in frequency.              --d/c laxatives (has been refusing them).              --monitor for c diff.   - 9/9 patient reports this is improving, continue to monitor  -9/10 LBM documented 9/8, continue to monitor for now  -9-11: Hold off 1 more day on additional laxatives given traditionally has diarrhea with this.  15.  Obesity class 1 w/BMI 34: Educate on diet and exercise.  16. Mood disorder: Stable off medications. 17.  Hypokalemia.   -9/9 30 mEq potassium today -9/10 20meq started for 2 days followed  by daily, recheck tomorrow  9-11: 3.6, repleted.  18. HTN  -9/10 hydrochlorothiazide  started for elevated DBP with therapy   - vitals stable    04/11/2024    5:33 AM 04/10/2024    8:29 PM 04/10/2024    1:40 PM  Vitals with BMI  Systolic 131 131 877  Diastolic 76 72 76  Pulse 77 77 79     19.  Urinary urgency.  Will get PVRs today, if low will start Myrbetriq  in AM.  LOS: 3 days A FACE TO FACE EVALUATION WAS PERFORMED  Joesph JAYSON Likes 04/11/2024, 9:44 AM

## 2024-04-11 NOTE — Progress Notes (Signed)
 Physical Therapy Session Note  Patient Details  Name: Alexa [name] MRN: [id number] Date of Birth: 18-Dec-1970  Today's Date: 04/11/2024 PT Individual Time: 1000-1100, 1400-1430 PT Individual Time Calculation (min): 60 min   Short Term Goals: Week 1:  PT Short Term Goal 1 (Week 1): STG = LTG due to ELOS  Skilled Therapeutic Interventions/Progress Updates:   AM Session: Pt received in recliner, agreeable to therapy. Pt reported slight L hip pain, but tolerated session well. Pt participated in Wii balance board games ~15 minutes w/o BUE, demonstrating good balance strategies, experiencing no LOB. Pt then ambulated to main gym to perform further balance testing w/ [employer] for . Dual task strategies employed to increase cognitive load, causing decrease in balance strategies. Pt required supervision to SBA ambulating back to room. Pt sat EOB and was given call bell and all other needs in reach. Bed alarm on.   PM Session: Pt received upright in recliner, agreeable to therapy. Pt ambulated outside >1034ft to improve functional activity tolerance, cardiovascular endurance, and improve BLE strength. Pt required SBA to CGA for safety. Pt ambulated over uneven surfaces, including grass, brick, and concrete to promote dynamic balance strategies and encourage community reintegration. Pt also asc/desc 6 steps 2x4. Pt experienced 3 LOB to R side, requiring minA to maintain pt upright. Pt unaware of LOBs when they occurred. Pt required 3 rest breaks throughout session. Pt reported pain ascending step leading w/ LLE, treated by leading w/ RLE up stairs. Pain unrated.  Pt returned to seated at EOB at end of session. Call bell given and all other needs. Bed alarm on.   Therapy Documentation Precautions:  Precautions Precautions: Fall Recall of Precautions/Restrictions: Intact Precaution/Restrictions Comments: SBP 90-160 Restrictions Weight Bearing Restrictions Per Provider Order:  No   Therapy/Group: Individual Therapy  Oneil Grumbles 04/11/2024, 12:54 PM

## 2024-04-12 LAB — MAGNESIUM: Magnesium: 2 mg/dL (ref 1.7–2.4)

## 2024-04-12 MED ORDER — DOCUSATE SODIUM 100 MG PO CAPS
100.0000 mg | ORAL_CAPSULE | Freq: Two times a day (BID) | ORAL | Status: DC
  Administered 2024-04-12 – 2024-04-15 (×6): 100 mg via ORAL
  Filled 2024-04-12 (×7): qty 1

## 2024-04-12 MED ORDER — MIRABEGRON ER 25 MG PO TB24
25.0000 mg | ORAL_TABLET | Freq: Every day | ORAL | Status: DC
  Administered 2024-04-12 – 2024-04-15 (×4): 25 mg via ORAL
  Filled 2024-04-12 (×4): qty 1

## 2024-04-12 NOTE — Progress Notes (Signed)
 Physical Therapy Session Note  Patient Details  Name: Alexa [name] MRN: [id number] Date of Birth: 08/31/1970  Today's Date: 04/12/2024 PT Individual Time: 0807-0902 PT Individual Time Calculation (min): 55 min   Short Term Goals: Week 1:  PT Short Term Goal 1 (Week 1): STG = LTG due to ELOS  Skilled Therapeutic Interventions/Progress Updates:    Pt presents in room, in bed agreeable to PT. Pt reporting mild pain in bilateral hips this morning. Pt discussing results of hip xray showing mild arthritis. Therapist provides education on arthritis and good prognosis  with strengthening with pt verbalizing understanding. Session focused on therapeutic activities for education, as well as NMR for BLE muscle fiber recruitment, dynamic standing balance, BLE coordination. Pt completes bed mobility modI. Pt ambulates without device with supervision to day room, comes to sitting on nustep. Pt completes continuous training on nustep  Pt completes NMR for dynamic standing balance, BLE coordination, BLE muscle fiber recruitment including: - sit to stands yellow tband around knees x10 - mini squats yellow band around knees (cues for hip abduction) x10 - resisted side stepping in mini squat yellow band around knees 2x10' - agility ladder forward gait (one foot in each square) x4 trials - agility ladder side stepping x4 trials bilaterally - agility ladder backwards step to gait x4 trials Pt provided with seated rest breaks between all gait trials and exercises to promote energy conservation and quality with tasks. Pt ambulates back to room without device with supervision, comes to sitting in recliner and remains with all needs within reach, call light in place, chair alarm activated at end of sessoin.    Therapy Documentation Precautions:  Precautions Precautions: Fall Recall of Precautions/Restrictions: Intact Precaution/Restrictions Comments: SBP 90-160 Restrictions Weight Bearing Restrictions Per  Provider Order: No    Therapy/Group: Individual Therapy  Reche Ohara PT, DPT 04/12/2024, 9:07 AM

## 2024-04-12 NOTE — Progress Notes (Signed)
 Physical Therapy Session Note  Patient Details  Name: Alexa [name] MRN: [id number] Date of Birth: [birth year]  Today's Date: 04/12/2024 PT Individual Time: 8695-8585 PT Individual Time Calculation (min): 70 min   Short Term Goals: Week 1:  PT Short Term Goal 1 (Week 1): STG = LTG due to ELOS  Skilled Therapeutic Interventions/Progress Updates:     Pt received seated in recliner and agrees to therapy. Reports pain in Rt hip with ambulation, and attributes it to soreness from working out. PT provides rest breaks and specific exercises to address pain. Pt ambulates to gym with cues for posture and increased stride length to improve balance. Pt takes seated rest break. PT demonstrates wall sits and then pt performs against column with blut platform positioned under buttocks for safety. Pt completes bouts of 1:00, 0:15, 0:45, and 0:30. PT provides cues for body mechanics, NM feedback, and correct performance. Pt takes seated rest breaks on platform between bouts. Pt then completes sidesteps with red theraband around distal thighs to challenge balance as well as strengthen hip abductors. Pt performs 12' in each direction prior to requiring rest break. Pt completes x4 bouts total with seated rest breaks. Pt then transitions to supine and perform exercises for coordination and Lower extremity and core strengthening. Pt completes 3x10 bridges in hooklying with red theraband around distal thighs to increase hip abductor recruitment. Pt completes 3x10 bridges in hooklying. Pt performs 3x20 alternating heel taps on mat in hooklying with return to original position hips and knees flexed to 90 degrees with back on mat. Pt ambulates back to room. Left seated with all needs within reach.  Therapy Documentation Precautions:  Precautions Precautions: Fall Recall of Precautions/Restrictions: Intact Precaution/Restrictions Comments: SBP 90-160 Restrictions Weight Bearing Restrictions Per Provider Order:  No   Therapy/Group: Individual Therapy  Elsie JAYSON Dawn, PT, DPT 04/12/2024, 3:51 PM

## 2024-04-12 NOTE — Progress Notes (Signed)
 PROGRESS NOTE   Subjective/Complaints: No events overnight.  Vitals remained stable.  Patient seen napping in bedside chair on exam, denies any complaints today, generally slept well overnight. Hip x-rays today showed mild bilateral arthritis; no acute changes or abnormalities. Used as needed oxycodone  for pain this morning.  States she had a bowel movement yesterday morning, not recorded  ROS: Patient denies fever, new vision changes, dizziness, nausea, vomiting,  shortness of breath or chest pain, headache, or mood change.  +Reports chronic constipation, had diarrhea after laxatives given recently but now improved--improved + Left hip pain--stable today  Objective:   DG HIP UNILAT WITH PELVIS 2-3 VIEWS LEFT Result Date: 04/11/2024 CLINICAL DATA:  Left hip pain. EXAM: DG HIP (WITH OR WITHOUT PELVIS) 2-3V LEFT COMPARISON:  None Available. FINDINGS: No acute fracture or dislocation. The bones are well mineralized. Mild bilateral hip and symphysis previous arthritic changes. The soft tissues are unremarkable. A catheter is noted over the pelvis. IMPRESSION: 1. No acute fracture or dislocation. 2. Mild arthritic changes. Electronically Signed   By: Vanetta Chou M.D.   On: 04/11/2024 15:11   Recent Labs    04/11/24 0400  WBC 3.3*  HGB 8.1*  HCT 25.7*  PLT 455*   Recent Labs    04/11/24 0400  NA 139  K 3.6  CL 104  CO2 26  GLUCOSE 104*  BUN 5*  CREATININE 0.81  CALCIUM  9.4    Intake/Output Summary (Last 24 hours) at 04/12/2024 0848 Last data filed at 04/12/2024 0800 Gross per 24 hour  Intake 882 ml  Output 20 ml  Net 862 ml        Physical Exam: Vital Signs Blood pressure 132/88, pulse 72, temperature 97.7 F (36.5 C), temperature source Oral, resp. rate 18, height 5' 8 (1.727 m), weight 102.6 kg, SpO2 99%.   General: No apparent distress, laying in bedside chair. HEENT:  Large fluctuant fluid area under  incision left occiput.  Steri-Strip intact, well-approximated. Staples and sutures from yesterday removed.  Areas remain well-approximated.  Heart: Reg rate and rhythm.  Chest: CTA bilaterally without wheezes, rales, or rhonchi; no distress Abdomen: Soft, non-tender, non-distended, bowel sounds positive.  Epigastric incision C/D/I  Extremities: Right lower extremity swelling 1+, trace left lower extremity Psych: Pt's affect is appropriate. Pt is cooperative Skin: Healing burn right infra clavicular area--stable from prior exams Neuro: Cranial nerves II through XII grossly intact, follows commands   Motor strength is 5/5 in upper extremities  4+/5 bilateral lower extremities  Sensory exam is normal to light touch in the upper and lower limbs , mild hypersensitivity R plantar surface--stable/improving  No hypertonia noted    Assessment/Plan: 1. Functional deficits which require 3+ hours per day of interdisciplinary therapy in a comprehensive inpatient rehab setting. Physiatrist is providing close team supervision and 24 hour management of active medical problems listed below. Physiatrist and rehab team continue to assess barriers to discharge/monitor patient progress toward functional and medical goals  Care Tool:  Bathing    Body parts bathed by patient: Right arm, Left arm, Chest, Abdomen, Front perineal area, Buttocks, Right upper leg, Left upper leg, Right lower leg, Left lower leg, Face  Bathing assist Assist Level: Set up assist     Upper Body Dressing/Undressing Upper body dressing   What is the patient wearing?: Pull over shirt    Upper body assist Assist Level: Independent    Lower Body Dressing/Undressing Lower body dressing      What is the patient wearing?: Underwear/pull up, Pants     Lower body assist Assist for lower body dressing: Supervision/Verbal cueing     Toileting Toileting    Toileting assist Assist for toileting: Supervision/Verbal  cueing     Transfers Chair/bed transfer  Transfers assist     Chair/bed transfer assist level: Supervision/Verbal cueing     Locomotion Ambulation   Ambulation assist      Assist level: Minimal Assistance - Patient > 75% Assistive device: No Device Max distance: 180'   Walk 10 feet activity   Assist     Assist level: Contact Guard/Touching assist Assistive device: No Device   Walk 50 feet activity   Assist    Assist level: Minimal Assistance - Patient > 75% Assistive device: No Device    Walk 150 feet activity   Assist    Assist level: Minimal Assistance - Patient > 75% Assistive device: No Device    Walk 10 feet on uneven surface  activity   Assist Walk 10 feet on uneven surfaces activity did not occur: Safety/medical concerns         Wheelchair     Assist Is the patient using a wheelchair?: Yes (uses as transport on unit) Type of Wheelchair: Manual    Wheelchair assist level: Dependent - Patient 0%      Wheelchair 50 feet with 2 turns activity    Assist        Assist Level: Dependent - Patient 0%   Wheelchair 150 feet activity     Assist      Assist Level: Dependent - Patient 0%   Blood pressure 132/88, pulse 72, temperature 97.7 F (36.5 C), temperature source Oral, resp. rate 18, height 5' 8 (1.727 m), weight 102.6 kg, SpO2 99%.  Medical Problem List and Plan: 1. Functional deficits secondary to spinal meneingitis             -patient may  shower             -ELOS/Goals: 7-10d, Supervision  -Continue CIR  -DC date expected 9/16  - Doing very well from rehab standpoint, supervision for mobility with complex balance and cognitive tasks.  2.  Antithrombotics: -DVT/anticoagulation:  Mechanical: Sequential compression devices, below knee Bilateral lower extremities             -antiplatelet therapy: N/a  3. Pain Management:  Tylenol  1000 mg qid for 3 more days then prn.  --oxycodone  prn for severe pain.    -9-11: Complaining of left hip pain; will get x-rays today.  Offered heating pad, patient refused.  Minimal use of PRNs. 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  Patient wishes to continue current medication regimen, did discuss limitations in use of NSAIDs unless cleared by neurosurgery given recent complications.  4. Mood/Behavior/Sleep: LCSW to follow for evaluation and support.              -antipsychotic agents: N/A 5. Neuropsych/cognition: This patient is capable of making decisions on her own behalf. 6. Skin/Wound Care: Routine pressure relief measures.              --monitor for recurrent drainage from scalp or from back.   -  9-11: No further drainage, Per Dr Gillie, can remove all staples/sutures, reviewed with nursing to steri-strip midline L incision before removal of suture to reduce tension--has remained well-approximated with no drainage 9-12     7. Fluids/Electrolytes/Nutrition: Monitor I/O. Check CMET in am. 8. CSF leak s/p VPS/with meningintis: Cultures without growth but to treat with 2 total weeks empiric antibiotics for meningitis             --Vanc (trough 15-20)/Cefepime  with EOT 04/14/24 --if relapse will need to have shunt removal.  9. Lumbosacral radiculopathy: R-foot paraesthesias-->on gabapentin  300 mg TID. 10. ABLA: Likely due to surgery/infection. Hgb 12.7 (July) --H/H ranging 7.5 to mid 8 range.              --recheck CBC in am.  Monitor for signs of infection.   - 9/9 Hemoglobin overall stable at 8.4  11. Fasting hyperglycemia: Will check A1C in am.   - HA1C 5.2, continue current regimen  12.  RLE edema: TEDs and elevation. Dopplers 09/02 negative for DVT RLE.  13. 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   - 10 x 11 x 26 mm heterogenous mass posterior to S2 seen on MRI 9/1; not well redemonstrated on CT   -asymptomatic  14.  Diarrhea: Has had multiple stools for past couple of days but decreasing in frequency.               --d/c laxatives (has been refusing them).              --monitor for c diff.   - 9/9 patient reports this is improving, continue to monitor  -9/10 LBM documented 9/8, continue to monitor for now  -9-11: Hold off 1 more day on additional laxatives given traditionally has diarrhea with this.  9-12: Endorses bowel movement yesterday morning, not recorded.  start Colace 100 mg twice daily.  15.  Obesity class 1 w/BMI 34: Educate on diet and exercise.  16. Mood disorder: Stable off medications. 17.  Hypokalemia.   -9/9 30 mEq potassium today -9/10 20meq started for 2 days followed by daily, recheck tomorrow  9-11: 3.6, repleted.  18. HTN  -9/10 hydrochlorothiazide  started for elevated DBP with therapy   - vitals stable    04/12/2024    3:37 AM 04/11/2024    8:23 PM 04/11/2024    2:00 PM  Vitals with BMI  Systolic 132 111 892  Diastolic 88 65 64  Pulse 72 78 87     19.  Urinary urgency.  Will get PVRs today, if low will start Myrbetriq  in AM.   - 9-12: Bladder scans low.  No documented incontinence, but patient still complains of urgency.  Will start Myrbetriq  25 mg daily.  Monitor for retention over the weekend LOS: 4 days A FACE TO FACE EVALUATION WAS PERFORMED  Joesph JAYSON Likes 04/12/2024, 8:48 AM

## 2024-04-12 NOTE — Progress Notes (Signed)
 Occupational Therapy Session Note  Patient Details  Name: Alexa [name] MRN: [id number] Date of Birth: 24-Nov-1970  Today's Date: 04/12/2024 OT Individual Time: 1047-1200 OT Individual Time Calculation (min): 73 min    Short Term Goals: Week 1:  OT Short Term Goal 1 (Week 1): STG=LTG d/t ELOS  Skilled Therapeutic Interventions/Progress Updates: Patient received sitting up in recliner. Agreeable to OT treatment. Good motivation and attention throughout treatment. 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      Therapy Documentation Precautions:  Precautions Precautions: Fall Recall of Precautions/Restrictions: Intact Precaution/Restrictions Comments: SBP 90-160 Restrictions Weight Bearing Restrictions Per Provider Order: No    Pain:Reports mild hip stiffness      Exercises:Sit to stand with 4 # medicine ball working on strength for standing from varried heights wihtout UE assist. Dowel therEx- 3 # bar 2 x 10 each- Biceps flexion, shoulder flexion, horizontal abduction, IR/ER, chest press. Good tolerance of all exercises, but did report discomfort with sit to stand from lower surfaces- with complaint activity was discontinued.    Other Treatments:  Functional mobility and IADL endurance building tasks. Patient ambulated from room to therapy kitchen for work on dynamic balance, carrying objects and item retrieval at varied heights. Patient with no complaints of pain but did have occasional mild balance check; no assist needed from therapist. Patient aware of deficits and motivated to continue working on high level balance so she can return to IADL's and hobby development once discharged home. Patient continued treatment with walking to the elevators to go outside for work on walking endurance and balance over varied surfaces. Patient ambulated with close SBA at times due to scissor step with going up  incline and cuing to maintain safety near uneven surface heights.    Therapy/Group: Individual Therapy  [name] 04/12/2024, 1:12 PM

## 2024-04-13 LAB — URINALYSIS, W/ REFLEX TO CULTURE (INFECTION SUSPECTED)
Bilirubin Urine: NEGATIVE
Glucose, UA: NEGATIVE mg/dL
Hgb urine dipstick: NEGATIVE
Ketones, ur: NEGATIVE mg/dL
Leukocytes,Ua: NEGATIVE
Nitrite: NEGATIVE
Protein, ur: NEGATIVE mg/dL
Specific Gravity, Urine: 1.005 (ref 1.005–1.030)
pH: 5 (ref 5.0–8.0)

## 2024-04-13 LAB — MAGNESIUM: Magnesium: 2 mg/dL (ref 1.7–2.4)

## 2024-04-13 NOTE — Progress Notes (Signed)
 PROGRESS NOTE   Subjective/Complaints: No events overnight.  No acute complaints.  Was having some pain in her right hip yesterday, but feels it is better today. Vitals stable     04/13/2024    4:49 AM 04/12/2024    8:58 PM 04/12/2024    2:42 PM  Vitals with BMI  Systolic 137 135 865  Diastolic 90 74 89  Pulse 73 73 75    No results for input(s): GLUCAP in the last 72 hours.   P.o. intakes appropriate  Continent of bladder  Last BM 9/11 per patient   ROS: Patient denies fever, new vision changes, dizziness, nausea, vomiting,  shortness of breath or chest pain, headache, or mood change.  +Reports chronic constipation, had diarrhea after laxatives given recently but now improved--improved + Left hip pain--stable today  Objective:   DG HIP UNILAT WITH PELVIS 2-3 VIEWS LEFT Result Date: 04/11/2024 CLINICAL DATA:  Left hip pain. EXAM: DG HIP (WITH OR WITHOUT PELVIS) 2-3V LEFT COMPARISON:  None Available. FINDINGS: No acute fracture or dislocation. The bones are well mineralized. Mild bilateral hip and symphysis previous arthritic changes. The soft tissues are unremarkable. A catheter is noted over the pelvis. IMPRESSION: 1. No acute fracture or dislocation. 2. Mild arthritic changes. Electronically Signed   By: Vanetta Chou M.D.   On: 04/11/2024 15:11   Recent Labs    04/11/24 0400  WBC 3.3*  HGB 8.1*  HCT 25.7*  PLT 455*   Recent Labs    04/11/24 0400  NA 139  K 3.6  CL 104  CO2 26  GLUCOSE 104*  BUN 5*  CREATININE 0.81  CALCIUM  9.4    Intake/Output Summary (Last 24 hours) at 04/13/2024 1126 Last data filed at 04/13/2024 9277 Gross per 24 hour  Intake 1009 ml  Output --  Net 1009 ml        Physical Exam: Vital Signs Blood pressure (!) 137/90, pulse 73, temperature 98.1 F (36.7 C), temperature source Oral, resp. rate 18, height 5' 8 (1.727 m), weight 102.6 kg, SpO2 95%.   General: No apparent  distress, sitting up in bedside chair. HEENT:  Large fluctuant fluid area under incision left occiput.  Steri-Strip intact, well-approximated--improving  Heart: Reg rate and rhythm.  Chest: CTA bilaterally without wheezes, rales, or rhonchi; no distress Abdomen: Soft, non-tender, non-distended, bowel sounds positive.  Epigastric incision C/D/I  Extremities: Right lower extremity swelling 1+, trace left lower extremity Psych: Pt's affect is appropriate. Pt is cooperative Skin: Healing burn right infra clavicular area--stable from prior exams Neuro: Cranial nerves II through XII grossly intact, follows commands   Motor strength is 5/5 in upper extremities  4+/5 bilateral lower extremities  Sensory exam is normal to light touch in the upper and lower limbs , mild hypersensitivity R plantar surface--stable/improving  No hypertonia noted  Physical exam unchanged from the above on reexamination 04/13/24    Assessment/Plan: 1. Functional deficits which require 3+ hours per day of interdisciplinary therapy in a comprehensive inpatient rehab setting. Physiatrist is providing close team supervision and 24 hour management of active medical problems listed below. Physiatrist and rehab team continue to assess barriers to discharge/monitor patient progress  toward functional and medical goals  Care Tool:  Bathing    Body parts bathed by patient: Right arm, Left arm, Chest, Abdomen, Front perineal area, Buttocks, Right upper leg, Left upper leg, Right lower leg, Left lower leg, Face         Bathing assist Assist Level: Set up assist     Upper Body Dressing/Undressing Upper body dressing   What is the patient wearing?: Pull over shirt    Upper body assist Assist Level: Independent    Lower Body Dressing/Undressing Lower body dressing      What is the patient wearing?: Underwear/pull up, Pants     Lower body assist Assist for lower body dressing: Supervision/Verbal cueing      Toileting Toileting    Toileting assist Assist for toileting: Supervision/Verbal cueing     Transfers Chair/bed transfer  Transfers assist     Chair/bed transfer assist level: Supervision/Verbal cueing     Locomotion Ambulation   Ambulation assist      Assist level: Minimal Assistance - Patient > 75% Assistive device: No Device Max distance: 180'   Walk 10 feet activity   Assist     Assist level: Contact Guard/Touching assist Assistive device: No Device   Walk 50 feet activity   Assist    Assist level: Minimal Assistance - Patient > 75% Assistive device: No Device    Walk 150 feet activity   Assist    Assist level: Minimal Assistance - Patient > 75% Assistive device: No Device    Walk 10 feet on uneven surface  activity   Assist Walk 10 feet on uneven surfaces activity did not occur: Safety/medical concerns         Wheelchair     Assist Is the patient using a wheelchair?: Yes (uses as transport on unit) Type of Wheelchair: Manual    Wheelchair assist level: Dependent - Patient 0%      Wheelchair 50 feet with 2 turns activity    Assist        Assist Level: Dependent - Patient 0%   Wheelchair 150 feet activity     Assist      Assist Level: Dependent - Patient 0%   Blood pressure (!) 137/90, pulse 73, temperature 98.1 F (36.7 C), temperature source Oral, resp. rate 18, height 5' 8 (1.727 m), weight 102.6 kg, SpO2 95%.  Medical Problem List and Plan: 1. Functional deficits secondary to spinal meneingitis             -patient may  shower             -ELOS/Goals: 7-10d, Supervision  -Continue CIR  -DC date expected 9/16  - Doing very well from rehab standpoint, supervision for mobility with complex balance and cognitive tasks.  2.  Antithrombotics: -DVT/anticoagulation:  Mechanical: Sequential compression devices, below knee Bilateral lower extremities             -antiplatelet therapy: N/a  3. Pain  Management:  Tylenol  1000 mg qid for 3 more days then prn.  --oxycodone  prn for severe pain.   -9-11: Complaining of left hip pain; will get x-rays today.  Offered heating pad, patient refused.  Minimal use of PRNs. 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  Patient wishes to continue current medication regimen, did discuss limitations in use of NSAIDs unless cleared by neurosurgery given recent complications.  4. Mood/Behavior/Sleep: LCSW to follow for evaluation and support.              -  antipsychotic agents: N/A 5. Neuropsych/cognition: This patient is capable of making decisions on her own behalf. 6. Skin/Wound Care: Routine pressure relief measures.              --monitor for recurrent drainage from scalp or from back.   - 9-11: No further drainage, Per Dr Gillie, can remove all staples/sutures, reviewed with nursing to steri-strip midline L incision before removal of suture to reduce tension--has remained well-approximated with no drainage 9-12     7. Fluids/Electrolytes/Nutrition: Monitor I/O. Check CMET in am. 8. CSF leak s/p VPS/with meningintis: Cultures without growth but to treat with 2 total weeks empiric antibiotics for meningitis             --Vanc (trough 15-20)/Cefepime  with EOT 04/14/24 --if relapse will need to have shunt removal.  9. Lumbosacral radiculopathy: R-foot paraesthesias-->on gabapentin  300 mg TID. 10. ABLA: Likely due to surgery/infection. Hgb 12.7 (July) --H/H ranging 7.5 to mid 8 range.              --recheck CBC in am.  Monitor for signs of infection.   - 9/9 Hemoglobin overall stable at 8.4  11. Fasting hyperglycemia: Will check A1C in am.   - HA1C 5.2, continue current regimen  12.  RLE edema: TEDs and elevation. Dopplers 09/02 negative for DVT RLE.  13. 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   - 10 x 11 x 26 mm heterogenous mass posterior to S2 seen on MRI 9/1; not well redemonstrated on CT    -asymptomatic  14.  Diarrhea: Has had multiple stools for past couple of days but decreasing in frequency.              --d/c laxatives (has been refusing them).              --monitor for c diff.   - 9/9 patient reports this is improving, continue to monitor  -9/10 LBM documented 9/8, continue to monitor for now  -9-11: Hold off 1 more day on additional laxatives given traditionally has diarrhea with this.  9-12: Endorses bowel movement yesterday morning, not recorded.  start Colace 100 mg twice daily.  15.  Obesity class 1 w/BMI 34: Educate on diet and exercise.  16. Mood disorder: Stable off medications. 17.  Hypokalemia.   -9/9 30 mEq potassium today -9/10 20meq started for 2 days followed by daily, recheck tomorrow  9-11: 3.6, repleted.  18. HTN  -9/10 hydrochlorothiazide  started for elevated DBP with therapy   - vitals stable    04/13/2024    4:49 AM 04/12/2024    8:58 PM 04/12/2024    2:42 PM  Vitals with BMI  Systolic 137 135 865  Diastolic 90 74 89  Pulse 73 73 75     19.  Urinary urgency.  Will get PVRs today, if low will start Myrbetriq  in AM.   - 9-12: Bladder scans low.  No documented incontinence, but patient still complains of urgency.  Will start Myrbetriq  25 mg daily.  Monitor for retention over the weekend   - 9/13: no PVRs; reminded nursing.  Continue for 1 to 2 days and then may increase if needed.  Patient still having some urgency.  Will check urinalysis just in case LOS: 5 days A FACE TO FACE EVALUATION WAS PERFORMED  Joesph JAYSON Likes 04/13/2024, 11:26 AM

## 2024-04-13 NOTE — Plan of Care (Signed)
  Problem: Consults Goal: RH GENERAL PATIENT EDUCATION Description: See Patient Education module for education specifics. Outcome: Progressing   Problem: RH BOWEL ELIMINATION Goal: RH STG MANAGE BOWEL WITH ASSISTANCE Description: STG Manage Bowel with mod I- supervision Assistance. Outcome: Progressing   Problem: RH BLADDER ELIMINATION Goal: RH STG MANAGE BLADDER WITH ASSISTANCE Description: STG Manage Bladder With Mod I- supervision Assistance Outcome: Progressing   Problem: RH SKIN INTEGRITY Goal: RH STG SKIN FREE OF INFECTION/BREAKDOWN Description: Manage skin free of infection with supervision assistance Outcome: Progressing   Problem: RH SAFETY Goal: RH STG ADHERE TO SAFETY PRECAUTIONS W/ASSISTANCE/DEVICE Description: STG Adhere to Safety Precautions With supervision  Assistance/Device. Outcome: Progressing   Problem: RH PAIN MANAGEMENT Goal: RH STG PAIN MANAGED AT OR BELOW PT'S PAIN GOAL Description: <4 w/ prns Outcome: Progressing   Problem: RH KNOWLEDGE DEFICIT GENERAL Goal: RH STG INCREASE KNOWLEDGE OF SELF CARE AFTER HOSPITALIZATION Description: Manage increase knowledge of self care from family  with supervision assistance using educational materials provided Outcome: Progressing

## 2024-04-13 NOTE — Progress Notes (Signed)
 Physical Therapy Session Note  Patient Details  Name: Alexa [name] MRN: [id number] Date of Birth: 12-02-1970  Today's Date: 04/13/2024 PT Individual Time: 1100-1155  PT Individual Time Calculation (min): 55 min  Short Term Goals: Week 1:  PT Short Term Goal 1 (Week 1): STG = LTG due to ELOS  Skilled Therapeutic Interventions/Progress Updates:  Chart reviewed and pt agreeable to therapy. Pt received seated in recliner with 0/10 c/o pain. Session focused on functional transfers, ambulation, and activity tolerance to promote safe home mobility and access. Pt initiated session with amb >1057ft for 25 mins around ground floor of hospital. Pt amb through gift shop and outdoors. After a brief break, pt amb another 20 mins for >1073ft around outdoor surfaces including uneven pavement and outdoor steps. Pt completed all activities using close S fading to distant S. At end of session, pt was left seated in recliner with alarm engaged, nurse call bell and all needs in reach.     Therapy Documentation Precautions:  Precautions Precautions: Fall Recall of Precautions/Restrictions: Intact Precaution/Restrictions Comments: SBP 90-160 Restrictions Weight Bearing Restrictions Per Provider Order: No General:      Therapy/Group: Individual Therapy   [name] 04/13/2024, 12:25 PM

## 2024-04-13 NOTE — Progress Notes (Addendum)
 Occupational Therapy Session Note  Patient Details  Name: Alexa [name] MRN: [id number] Date of Birth: 18-Feb-1971  Today's Date: 04/13/2024 OT Individual Time: 0900-1000 OT Individual Time Calculation (min): 60 min    Short Term Goals: Week 1:  OT Short Term Goal 1 (Week 1): STG=LTG d/t ELOS  Skilled Therapeutic Interventions/Progress Updates:    Patient resting in bed at the time of arrrival with no pain to report at the time of treatment. The pt went on to say that she rested  okay and that she wanted to complete a BADL related task in showering. Secondary to the pt having a PIC  line and time constrates the pt decided to bathe at sink LOF. The pt was able to come from supine in bed to EOB with CGA. The pt was able to donn her slippers with s/uA. The pt was able to ambulate to the restroom with her IV pole with CGA. The pt was able to doff her LB garments with MinA. The pt was able to complete hyigene care following toileting with s/uA, she was able to wash her front and bottom with s/uA as well.  The pt was able ot come from sit  to stand using the grab bars for additional balance. The pt was able to ambulate from the restroom to the sink for washing her hands, brushing her teeth , washing her face, chest, and BUE with s/uA  to CGA. The pt was s/uA for donning her over head shirt and applying deo at [facility]. The pt was  able to  ambulate from the sink to the recliner with CGA and was able to transfer from standing to sitting in the recliner incorporating the arm of the recliner for placement. The pt was assisted in going into a recline position with the call light and the bedside table within reach and all additional needs addressed prior to exiting the room. .   Therapy Documentation Precautions:  Precautions Precautions: Fall Recall of Precautions/Restrictions: Intact Precaution/Restrictions Comments: SBP 90-160 Restrictions Weight Bearing Restrictions Per Provider Order:  No  Therapy/Group: Individual Therapy  Elvera JONETTA Mace 04/13/2024, 10:40 AM

## 2024-04-13 NOTE — Progress Notes (Signed)
 Physical Therapy Session Note  Patient Details  Name: Alexa [name] MRN: [id number] Date of Birth: [birth year]  Today's Date: 04/13/2024 PT Individual Time: 8454-8345 PT Individual Time Calculation (min): 69 min   Short Term Goals: Week 1:  PT Short Term Goal 1 (Week 1): STG = LTG due to ELOS  Skilled Therapeutic Interventions/Progress Updates:      Pt presents in bed with her sister present. Pt denies any pain. Bed mobility completed mod I. Sit<>Stand with supervision and no AD from bed. She ambulated at supervision level from her room to the main gym, ~163ft.   Completed BERG balance test with results outlined below.   Patient demonstrates increased fall risk as noted by score of   53/56 on Berg Balance Scale.  (<36= high risk for falls, close to 100%; 37-45 significant >80%; 46-51 moderate >50%; 52-55 lower >25%).  Balance: Balance Balance Assessed: Yes Standardized Balance Assessment Standardized Balance Assessment: Berg Balance Test  Berg Balance Test Sit to Stand: Able to stand without using hands and stabilize independently Standing Unsupported: Able to stand safely 2 minutes Sitting with Back Unsupported but Feet Supported on Floor or Stool: Able to sit safely and securely 2 minutes Stand to Sit: Sits safely with minimal use of hands Transfers: Able to transfer safely, minor use of hands Standing Unsupported with Eyes Closed: Able to stand 10 seconds safely Standing Ubsupported with Feet Together: Able to place feet together independently and stand 1 minute safely From Standing, Reach Forward with Outstretched Arm: Can reach confidently >25 cm (10) From Standing Position, Pick up Object from Floor: Able to pick up shoe safely and easily From Standing Position, Turn to Look Behind Over each Shoulder: Looks behind from both sides and weight shifts well Turn 360 Degrees: Able to turn 360 degrees safely one side only in 4 seconds or less Standing Unsupported, Alternately  Place Feet on Step/Stool: Able to stand independently and safely and complete 8 steps in 20 seconds Standing Unsupported, One Foot in Front: Able to plae foot ahead of the other independently and hold 30 seconds Standing on One Leg: Able to lift leg independently and hold 5-10 seconds Total Score: 53/56  Pt challenged with higher level balance training using the BITS balance system. Sensor placed on board rather than using as wearable.  -Completed static postural sway testing to challenge postural sway within the balance point for specific duration. Targets set to 3 o'clock, 6 o'clock, 9 o'clock, and 12 o'clock.  -Completed dynamic pursuit/pattern parameter while following a moving stimulus in pre-defined pattern (figure 8). Patient had to maintain target throughout body movement to challenge visuomotor coordination, awareness, reaction time, and postural control.   Pt finished session on Nustep with L4 resistance using BUE/BLE - completed x10 minutes total. 578 steps and 0.3 miles.  She returned to her room at completion of session. Left sitting EOB with family present. All needs met.      Therapy Documentation Precautions:  Precautions Precautions: Fall Recall of Precautions/Restrictions: Intact Precaution/Restrictions Comments: SBP 90-160 Restrictions Weight Bearing Restrictions Per Provider Order: No General:      Therapy/Group: Individual Therapy  Sherlean SHAUNNA Perks 04/13/2024, 7:38 AM

## 2024-04-14 LAB — MAGNESIUM: Magnesium: 1.9 mg/dL (ref 1.7–2.4)

## 2024-04-14 NOTE — Progress Notes (Addendum)
 PROGRESS NOTE   Subjective/Complaints: No events overnight.  No acute complaints. Vitals stable  Last bowel movement still recorded as 9-11. Patient mentions that on inpatient, she was told antibiotics.  Today; indeed, appears that she will be at 2 weeks of antibiotics today as opposed to 9-16 on discharge documentation.  She would like to go home tomorrow once antibiotics are completed if possible.  Discussed with weekend team, no concerns regarding this.  ROS: Patient denies fever, new vision changes, dizziness, nausea, vomiting,  shortness of breath or chest pain, headache, or mood change.  +Reports chronic constipation, had diarrhea after laxatives given recently but now improved--improved + Left hip pain--stable today  Objective:   No results found.  No results for input(s): WBC, HGB, HCT, PLT in the last 72 hours.  No results for input(s): NA, K, CL, CO2, GLUCOSE, BUN, CREATININE, CALCIUM  in the last 72 hours.   Intake/Output Summary (Last 24 hours) at 04/14/2024 1014 Last data filed at 04/14/2024 0900 Gross per 24 hour  Intake 1259.13 ml  Output --  Net 1259.13 ml        Physical Exam: Vital Signs Blood pressure (!) 140/94, pulse 74, temperature 97.9 F (36.6 C), resp. rate 19, height 5' 8 (1.727 m), weight 102.6 kg, SpO2 99%.   General: No apparent distress, sitting up in bedside chair. HEENT:  Large fluctuant fluid area under incision left occiput.  Steri-Strip intact, well-approximated--stable exam - 14  Heart: Reg rate and rhythm.  Sitting upright in bed. Chest: CTA bilaterally without wheezes, rales, or rhonchi; no distress Abdomen: Soft, non-tender, non-distended, bowel sounds positive.   Extremities: Right lower extremity swelling 1+, trace left lower extremity Psych: Pt's affect is appropriate. Pt is cooperative Skin: Healing burn right infra clavicular area--stable from  prior exams Epigastric incision C/D/I --healed  Neuro: Cranial nerves II through XII grossly intact, follows commands No apparent cognitive deficits  Motor strength is 5/5 in upper extremities; 4 in bilateral hip flexors and 5/5 otherwise in bilateral lower extremities  Sensory exam   mild hypersensitivity R plantar surface--stable/improving  No hypertonia noted  No ataxia  Reflexes 2+ throughout, negative Hoffman's and Babinski.  2 beats of clonus on right foot, none on left foot.  Assessment/Plan: 1. Functional deficits which require 3+ hours per day of interdisciplinary therapy in a comprehensive inpatient rehab setting. Physiatrist is providing close team supervision and 24 hour management of active medical problems listed below. Physiatrist and rehab team continue to assess barriers to discharge/monitor patient progress toward functional and medical goals  Care Tool:  Bathing    Body parts bathed by patient: Right arm, Left arm, Chest, Abdomen, Front perineal area, Buttocks, Right upper leg, Left upper leg, Right lower leg, Left lower leg, Face         Bathing assist Assist Level: Set up assist     Upper Body Dressing/Undressing Upper body dressing   What is the patient wearing?: Pull over shirt    Upper body assist Assist Level: Independent    Lower Body Dressing/Undressing Lower body dressing      What is the patient wearing?: Underwear/pull up, Pants     Lower body assist  Assist for lower body dressing: Supervision/Verbal cueing     Toileting Toileting    Toileting assist Assist for toileting: Supervision/Verbal cueing     Transfers Chair/bed transfer  Transfers assist     Chair/bed transfer assist level: Supervision/Verbal cueing     Locomotion Ambulation   Ambulation assist      Assist level: Minimal Assistance - Patient > 75% Assistive device: No Device Max distance: 180'   Walk 10 feet activity   Assist     Assist level:  Contact Guard/Touching assist Assistive device: No Device   Walk 50 feet activity   Assist    Assist level: Minimal Assistance - Patient > 75% Assistive device: No Device    Walk 150 feet activity   Assist    Assist level: Minimal Assistance - Patient > 75% Assistive device: No Device    Walk 10 feet on uneven surface  activity   Assist Walk 10 feet on uneven surfaces activity did not occur: Safety/medical concerns         Wheelchair     Assist Is the patient using a wheelchair?: Yes (uses as transport on unit) Type of Wheelchair: Manual    Wheelchair assist level: Dependent - Patient 0%      Wheelchair 50 feet with 2 turns activity    Assist        Assist Level: Dependent - Patient 0%   Wheelchair 150 feet activity     Assist      Assist Level: Dependent - Patient 0%   Blood pressure (!) 140/94, pulse 74, temperature 97.9 F (36.6 C), resp. rate 19, height 5' 8 (1.727 m), weight 102.6 kg, SpO2 99%.  Medical Problem List and Plan: 1. 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  The patient is medically ready for discharge to home and will need follow-up with [facility]. In addition, they will need to follow up with their PCP, Neurosurgery.   2.  Antithrombotics: -DVT/anticoagulation:  Mechanical: Sequential compression devices, below knee Bilateral lower extremities             -antiplatelet therapy: N/a  3. Pain Management:  Tylenol  1000 mg qid for 3 more days then prn.  --oxycodone  prn for severe pain.   -9-11: Complaining of left hip pain; will get x-rays today.  Offered heating pad, patient refused.  Minimal use of  PRNs. 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  Patient wishes to continue current medication regimen, did discuss limitations in use of NSAIDs unless cleared by neurosurgery given recent complications. Pain well-controlled on current regimen 9-14  4. Mood/Behavior/Sleep: LCSW to follow for evaluation and support.              -antipsychotic agents: N/A 5. Neuropsych/cognition: This patient is capable of making decisions on her own behalf. 6. Skin/Wound Care: Routine pressure relief measures.              --monitor for recurrent drainage from scalp or from back.   - 9-11: No further drainage, Per Dr Gillie, can remove all staples/sutures, reviewed with nursing to steri-strip midline L  incision before removal of suture to reduce tension--has remained well-approximated with no drainage 9-12   9-14: DC PICC order placed after review of ID note 9-5   7. Fluids/Electrolytes/Nutrition: Monitor I/O. Check CMET in am. 8. CSF leak s/p VPS/with meningintis: Cultures without growth but to treat with 2 total weeks empiric antibiotics for meningitis             --Vanc (trough 15-20)/Cefepime  with EOT 04/14/24--completed --if relapse will need to have shunt removal.   9. Lumbosacral radiculopathy: R-foot paraesthesias-->on gabapentin  300 mg TID. 10. ABLA: Likely due to surgery/infection. Hgb 12.7 (July) --H/H ranging 7.5 to mid 8 range.              --recheck CBC in am.  Monitor for signs of infection.   - 9/9 Hemoglobin overall stable at 8.4  11. Fasting hyperglycemia: Will check A1C in am.   - HA1C 5.2, continue current regimen  12.  RLE edema: TEDs and elevation. Dopplers 09/02 negative for DVT RLE.  13. 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   - 10 x 11 x 26 mm heterogenous mass posterior to S2 seen on MRI 9/1; not well redemonstrated on CT   -asymptomatic  14.  Diarrhea: Has had multiple stools for past couple of days but decreasing in frequency.               --d/c laxatives (has been refusing them).              --monitor for c diff.   - 9/9 patient reports this is improving, continue to monitor  -9/10 LBM documented 9/8, continue to monitor for now  -9-11: Hold off 1 more day on additional laxatives given traditionally has diarrhea with this.  9-12: Endorses bowel movement yesterday morning, not recorded.  start Colace 100 mg twice daily.  9-14: Last bowel movement recorded 9-11; request as needed MiraLAX  today, patient reports she had a bowel movement in the last 48 hours and does not want this  15.  Obesity class 1 w/BMI 34: Educate on diet and exercise.  16. Mood disorder: Stable off medications. 17.  Hypokalemia.   -9/9 30 mEq potassium today -9/10 20meq started for 2 days followed by daily, recheck tomorrow  9-11: 3.6, repleted.  18. HTN  -9/10 hydrochlorothiazide  started for elevated DBP with therapy   - vitals stable    04/14/2024    3:10 AM 04/13/2024    7:44 PM 04/13/2024    2:21 PM  Vitals with BMI  Systolic 140 132 879  Diastolic 94 88 82  Pulse 74 74 86     19.  Urinary urgency.  Will get PVRs today, if low will start Myrbetriq  in AM.   - 9-12: Bladder scans low.  No documented incontinence, but patient still complains of urgency.  Will start Myrbetriq  25 mg daily.  Monitor for retention over the weekend   - 9/13: no PVRs; reminded nursing.  Continue for 1 to 2 days and then may increase if needed.  Patient still having some urgency.  Will check urinalysis just in case  9-14: Urinalysis negative, PVR is low.  Continue Myrbetriq  at current dose for discharge. LOS: 6 days A FACE TO FACE EVALUATION WAS PERFORMED  Joesph JAYSON Likes 04/14/2024, 10:14 AM

## 2024-04-14 NOTE — Plan of Care (Signed)
  Problem: Consults Goal: RH GENERAL PATIENT EDUCATION Description: See Patient Education module for education specifics. Outcome: Progressing   Problem: RH BOWEL ELIMINATION Goal: RH STG MANAGE BOWEL WITH ASSISTANCE Description: STG Manage Bowel with mod I- supervision Assistance. Outcome: Progressing   Problem: RH BLADDER ELIMINATION Goal: RH STG MANAGE BLADDER WITH ASSISTANCE Description: STG Manage Bladder With Mod I- supervision Assistance Outcome: Progressing   Problem: RH SKIN INTEGRITY Goal: RH STG SKIN FREE OF INFECTION/BREAKDOWN Description: Manage skin free of infection with supervision assistance Outcome: Progressing   Problem: RH SAFETY Goal: RH STG ADHERE TO SAFETY PRECAUTIONS W/ASSISTANCE/DEVICE Description: STG Adhere to Safety Precautions With supervision  Assistance/Device. Outcome: Progressing   Problem: RH PAIN MANAGEMENT Goal: RH STG PAIN MANAGED AT OR BELOW PT'S PAIN GOAL Description: <4 w/ prns Outcome: Progressing   Problem: RH KNOWLEDGE DEFICIT GENERAL Goal: RH STG INCREASE KNOWLEDGE OF SELF CARE AFTER HOSPITALIZATION Description: Manage increase knowledge of self care from family  with supervision assistance using educational materials provided Outcome: Progressing

## 2024-04-15 ENCOUNTER — Other Ambulatory Visit (HOSPITAL_COMMUNITY): Payer: Self-pay

## 2024-04-15 ENCOUNTER — Telehealth (HOSPITAL_COMMUNITY): Admitting: Psychiatry

## 2024-04-15 LAB — CBC
HCT: 27.8 % — ABNORMAL LOW (ref 36.0–46.0)
Hemoglobin: 8.8 g/dL — ABNORMAL LOW (ref 12.0–15.0)
MCH: 27.7 pg (ref 26.0–34.0)
MCHC: 31.7 g/dL (ref 30.0–36.0)
MCV: 87.4 fL (ref 80.0–100.0)
Platelets: 316 K/uL (ref 150–400)
RBC: 3.18 MIL/uL — ABNORMAL LOW (ref 3.87–5.11)
RDW: 12.6 % (ref 11.5–15.5)
WBC: 2.4 K/uL — ABNORMAL LOW (ref 4.0–10.5)
nRBC: 0 % (ref 0.0–0.2)

## 2024-04-15 LAB — BASIC METABOLIC PANEL WITH GFR
Anion gap: 8 (ref 5–15)
BUN: 8 mg/dL (ref 6–20)
CO2: 26 mmol/L (ref 22–32)
Calcium: 9.2 mg/dL (ref 8.9–10.3)
Chloride: 104 mmol/L (ref 98–111)
Creatinine, Ser: 0.74 mg/dL (ref 0.44–1.00)
GFR, Estimated: >60 mL/min (ref >60)
Glucose, Bld: 105 mg/dL — ABNORMAL HIGH (ref 70–99)
Potassium: 3.7 mmol/L (ref 3.5–5.1)
Sodium: 138 mmol/L (ref 135–145)

## 2024-04-15 LAB — MAGNESIUM: Magnesium: 1.8 mg/dL (ref 1.7–2.4)

## 2024-04-15 MED ORDER — CALCIUM POLYCARBOPHIL 625 MG PO TABS
625.0000 mg | ORAL_TABLET | Freq: Two times a day (BID) | ORAL | 0 refills | Status: DC
  Filled 2024-04-15: qty 60, 30d supply, fill #0

## 2024-04-15 MED ORDER — DOCUSATE SODIUM 100 MG PO CAPS
100.0000 mg | ORAL_CAPSULE | Freq: Two times a day (BID) | ORAL | 0 refills | Status: DC
  Filled 2024-04-15: qty 60, 30d supply, fill #0

## 2024-04-15 MED ORDER — MIRABEGRON ER 25 MG PO TB24
25.0000 mg | ORAL_TABLET | Freq: Every day | ORAL | 0 refills | Status: DC
  Filled 2024-04-15: qty 30, 30d supply, fill #0

## 2024-04-15 MED ORDER — ACETAMINOPHEN 325 MG PO TABS: 650.0000 mg | ORAL_TABLET | Freq: Three times a day (TID) | ORAL | Status: AC

## 2024-04-15 MED ORDER — METHOCARBAMOL 500 MG PO TABS
500.0000 mg | ORAL_TABLET | Freq: Three times a day (TID) | ORAL | 0 refills | Status: DC
  Filled 2024-04-15: qty 90, 30d supply, fill #0

## 2024-04-15 MED ORDER — GABAPENTIN 400 MG PO CAPS
400.0000 mg | ORAL_CAPSULE | Freq: Three times a day (TID) | ORAL | 0 refills | Status: DC
  Filled 2024-04-15: qty 90, 30d supply, fill #0

## 2024-04-15 NOTE — Progress Notes (Signed)
 INPATIENT REHABILITATION DISCHARGE NOTE   Discharge instructions by: Holley, PA   Verbalized understanding: Yes  Skin care/Wound care healing? yes  Pain: medicated   IV's: None  Tubes/Drains: none  O2: none  Safety instructions: reviewed with pt  Patient belongings: sent with pt  Discharged to: home  Discharged via: family transport  Notes: done  Geralynn LELON Earl, Charity fundraiser

## 2024-04-15 NOTE — Plan of Care (Signed)
  Problem: RH Grooming Goal: LTG Patient will perform grooming w/assist,cues/equip (OT) Description: LTG: Patient will perform grooming with assist, with/without cues using equipment (OT) Outcome: Completed/Met   Problem: RH Bathing Goal: LTG Patient will bathe all body parts with assist levels (OT) Description: LTG: Patient will bathe all body parts with assist levels (OT) Outcome: Completed/Met   Problem: RH Dressing Goal: LTG Patient will perform upper body dressing (OT) Description: LTG Patient will perform upper body dressing with assist, with/without cues (OT). Outcome: Completed/Met Goal: LTG Patient will perform lower body dressing w/assist (OT) Description: LTG: Patient will perform lower body dressing with assist, with/without cues in positioning using equipment (OT) Outcome: Completed/Met   Problem: RH Toileting Goal: LTG Patient will perform toileting task (3/3 steps) with assistance level (OT) Description: LTG: Patient will perform toileting task (3/3 steps) with assistance level (OT)  Outcome: Completed/Met   Problem: RH Toilet Transfers Goal: LTG Patient will perform toilet transfers w/assist (OT) Description: LTG: Patient will perform toilet transfers with assist, with/without cues using equipment (OT) Outcome: Completed/Met   Problem: RH Tub/Shower Transfers Goal: LTG Patient will perform tub/shower transfers w/assist (OT) Description: LTG: Patient will perform tub/shower transfers with assist, with/without cues using equipment (OT) Outcome: Completed/Met

## 2024-04-15 NOTE — Plan of Care (Signed)

## 2024-04-15 NOTE — Progress Notes (Signed)
 PROGRESS NOTE   Subjective/Complaints: Patient discharging today.  No new complaints or concerns.  ROS: Patient denies fever, new vision changes, dizziness, nausea, vomiting,  shortness of breath or chest pain, headache, or mood change.  Denies new motor or sensory changes  +Reports chronic constipation, had diarrhea after laxatives given recently but now improved--improved + Left hip pain--stable  Objective:   No results found.  Recent Labs    04/15/24 0534  WBC 2.4*  HGB 8.8*  HCT 27.8*  PLT 316    Recent Labs    04/15/24 0534  NA 138  K 3.7  CL 104  CO2 26  GLUCOSE 105*  BUN 8  CREATININE 0.74  CALCIUM  9.2     Intake/Output Summary (Last 24 hours) at 04/15/2024 1642 Last data filed at 04/14/2024 1811 Gross per 24 hour  Intake 472 ml  Output --  Net 472 ml        Physical Exam: Vital Signs Blood pressure 135/83, pulse 76, temperature 98.2 F (36.8 C), temperature source Oral, resp. rate 19, height 5' 8 (1.727 m), weight 102.6 kg, SpO2 99%.   General: No apparent distress, lying in bed appears comfortable HEENT:  Large fluctuant fluid area under incision left occiput.  Steri-Strip intact, well-approximated--stable exam  Heart: Reg rate and rhythm.   Chest: CTA bilaterally without wheezes, rales, or rhonchi; no distress, nonlabored breathing Abdomen: Soft, non-tender, non-distended, bowel sounds positive.   Extremities: Right lower extremity swelling 1+, trace left lower extremity Psych: Pt's affect is appropriate. Pt is cooperative Skin: Healing burn right infra clavicular area--stable from prior exams Epigastric incision C/D/I --healed  Neuro: Cranial nerves II through XII grossly intact, follows commands No apparent cognitive deficits  Motor strength is 5/5 in upper extremities; 4 in bilateral hip flexors and 5/5 otherwise in bilateral lower extremities  Sensory exam   mild  hypersensitivity R plantar surface--stable/improving  No abnormal tone noted  No ataxia  Reflexes 2+ throughout, negative Hoffman's and Babinski.  2 beats of clonus on right foot, none on left foot.  Prior neuro assessment is c/w today's exam 04/15/2024.   Assessment/Plan: 1. Functional deficits which require 3+ hours per day of interdisciplinary therapy in a comprehensive inpatient rehab setting. Physiatrist is providing close team supervision and 24 hour management of active medical problems listed below. Physiatrist and rehab team continue to assess barriers to discharge/monitor patient progress toward functional and medical goals  Care Tool:  Bathing    Body parts bathed by patient: Right arm, Left arm, Chest, Abdomen, Front perineal area, Buttocks, Right upper leg, Left upper leg, Right lower leg, Left lower leg, Face         Bathing assist Assist Level: Independent with assistive device     Upper Body Dressing/Undressing Upper body dressing   What is the patient wearing?: Pull over shirt    Upper body assist Assist Level: Independent    Lower Body Dressing/Undressing Lower body dressing      What is the patient wearing?: Underwear/pull up, Pants     Lower body assist Assist for lower body dressing: Independent with assitive device     Toileting Toileting    Toileting assist  Assist for toileting: Independent with assistive device     Transfers Chair/bed transfer  Transfers assist     Chair/bed transfer assist level: Independent     Locomotion Ambulation   Ambulation assist      Assist level: Independent Assistive device: No Device Max distance: 965'   Walk 10 feet activity   Assist     Assist level: Independent Assistive device: No Device   Walk 50 feet activity   Assist    Assist level: Independent Assistive device: No Device    Walk 150 feet activity   Assist    Assist level: Independent Assistive device: No Device     Walk 10 feet on uneven surface  activity   Assist Walk 10 feet on uneven surfaces activity did not occur: Safety/medical concerns   Assist level: Independent     Wheelchair     Assist Is the patient using a wheelchair?: No (ambulates on unit) Type of Wheelchair: Manual    Wheelchair assist level: Dependent - Patient 0%      Wheelchair 50 feet with 2 turns activity    Assist        Assist Level: Dependent - Patient 0%   Wheelchair 150 feet activity     Assist      Assist Level: Dependent - Patient 0%   Blood pressure 135/83, pulse 76, temperature 98.2 F (36.8 C), temperature source Oral, resp. rate 19, height 5' 8 (1.727 m), weight 102.6 kg, SpO2 99%.  Medical Problem List and Plan: 1. 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  The patient is medically ready for discharge to home and will need follow-up with [facility]. In addition, they will need to follow up with their PCP, Neurosurgery.  - DC home today   2.  Antithrombotics: -DVT/anticoagulation:  Mechanical: Sequential compression devices, below knee Bilateral lower extremities             -antiplatelet therapy: N/a  3. Pain Management:  Tylenol  1000 mg qid for 3 more days then prn.  --oxycodone  prn for severe pain.   -9-11: Complaining of left hip pain; will get x-rays today.  Offered heating pad, patient refused.  Minimal use of PRNs. 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  Patient wishes to continue current medication regimen, did discuss limitations in use of NSAIDs unless cleared by neurosurgery given recent complications. Pain well-controlled on current  regimen 9-14 Patient reports pain is controlled again today 9/15  4. Mood/Behavior/Sleep: LCSW to follow for evaluation and support.              -antipsychotic agents: N/A 5. Neuropsych/cognition: This patient is capable of making decisions on her own behalf. 6. Skin/Wound Care: Routine pressure relief measures.              --monitor for recurrent drainage from scalp or from back.   - 9-11: No further drainage, Per Dr Gillie, can remove all staples/sutures, reviewed with nursing to steri-strip midline L incision before removal of suture to reduce tension--has remained well-approximated with no drainage 9-12   9-14: DC PICC order  placed after review of ID note 9-5   7. Fluids/Electrolytes/Nutrition: Monitor I/O. Check CMET in am. 8. CSF leak s/p VPS/with meningintis: Cultures without growth but to treat with 2 total weeks empiric antibiotics for meningitis             --Vanc (trough 15-20)/Cefepime  with EOT 04/14/24--completed --if relapse will need to have shunt removal.   9. Lumbosacral radiculopathy: R-foot paraesthesias-->on gabapentin  300 mg TID. 10. ABLA: Likely due to surgery/infection. Hgb 12.7 (July) --H/H ranging 7.5 to mid 8 range.              --recheck CBC in am.  Monitor for signs of infection.   - 9/9 Hemoglobin overall stable at 8.4  9/15 hemoglobin stable at 8.8  11. Fasting hyperglycemia: Will check A1C in am.   - HA1C 5.2, continue current regimen  12.  RLE edema: TEDs and elevation. Dopplers 09/02 negative for DVT RLE.  13. 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   - 10 x 11 x 26 mm heterogenous mass posterior to S2 seen on MRI 9/1; not well redemonstrated on CT   -asymptomatic  14.  Diarrhea: Has had multiple stools for past couple of days but decreasing in frequency.              --d/c laxatives (has been refusing them).              --monitor for c diff.   - 9/9 patient reports this is improving, continue to monitor  -9/10 LBM  documented 9/8, continue to monitor for now  -9-11: Hold off 1 more day on additional laxatives given traditionally has diarrhea with this.  9-12: Endorses bowel movement yesterday morning, not recorded.  start Colace 100 mg twice daily.  9-14: Last bowel movement recorded 9-11; request as needed MiraLAX  today, patient reports she had a bowel movement in the last 48 hours and does not want this  -9/15 BM yesterday  15.  Obesity class 1 w/BMI 34: Educate on diet and exercise.  16. Mood disorder: Stable off medications. 17.  Hypokalemia.   -9/9 30 mEq potassium today -9/10 20meq started for 2 days followed by daily, recheck tomorrow  9-11: 3.6, repleted.  9/15 potassium stable at 3.7  18. HTN  -9/10 hydrochlorothiazide  started for elevated DBP with therapy   - 9/15 BP well-controlled    04/15/2024    3:44 AM 04/14/2024    8:18 PM 04/14/2024    2:41 PM  Vitals with BMI  Systolic 135 114 891  Diastolic 83 78 69  Pulse 76 74 79     19.  Urinary urgency.  Will get PVRs today, if low will start Myrbetriq  in AM.   - 9-12: Bladder scans low.  No documented incontinence, but patient still complains of urgency.  Will start Myrbetriq  25 mg daily.  Monitor for retention over the weekend   - 9/13: no PVRs; reminded nursing.  Continue for 1 to 2 days and then may increase if needed.  Patient still having some urgency.  Will check urinalysis just in case  9-14: Urinalysis negative, PVR is low.  Continue Myrbetriq  at current dose for discharge. LOS: 7 days A FACE TO FACE EVALUATION WAS PERFORMED  Murray Collier 04/15/2024, 4:42 PM

## 2024-04-15 NOTE — Progress Notes (Signed)
 Inpatient Rehabilitation Discharge Medication Review by a Pharmacist  A complete drug regimen review was completed for this patient to identify any potential clinically significant medication issues.  High Risk Drug Classes Is patient taking? Indication by Medication  Antipsychotic No   Anticoagulant No   Antibiotic No   Opioid No   Antiplatelet No   Hypoglycemics/insulin No   Vasoactive Medication Yes Atenolol , hydrochlorothiazide : HTN  Chemotherapy No   Other Yes Tylenol : pain Docusate, Fibercon: constipation Myrbetriq : urinary urgency Gabapentin : pain Robaxin : muscle spasms     Type of Medication Issue Identified Description of Issue Recommendation(s)  Drug Interaction(s) (clinically significant)     Duplicate Therapy     Allergy     No Medication Administration End Date     Incorrect Dose     Additional Drug Therapy Needed     Significant med changes from prior encounter (inform family/care partners about these prior to discharge).  Restart or discontinue as appropriate. Communicate medication changes with patient/family at discharge  Other       Clinically significant medication issues were identified that warrant physician communication and completion of prescribed/recommended actions by midnight of the next day:  No  Time spent performing this drug regimen review (minutes): 30  Thank you for allowing pharmacy to be a part of this patient's care.   Bascom JAYSON Louder, PharmD 04/15/2024 7:47 AM   **Pharmacist phone directory can be found on [email or web address] listed under [facility]**

## 2024-04-15 NOTE — Progress Notes (Signed)
 Occupational Therapy Session Note  Patient Details  Name: Alexa [name] MRN: [id number] Date of Birth: 02-19-1971  Today's Date: 04/15/2024 OT Individual Time: 0905-1010 OT Individual Time Calculation (min): 65 min   Today's Date: 04/15/2024 OT Individual Time: 8881-8799 OT Individual Time Calculation (min): 42 min   Short Term Goals: Week 1:  OT Short Term Goal 1 (Week 1): STG=LTG d/t ELOS  Skilled Therapeutic Interventions/Progress Updates:   Session 1: Pt greeted resting in bed for skilled OT session with focus on discharge planning, SW rounding during session, and BADL participation.   Pain: Pt with no reports of pain, OT offering intermediate rest breaks and positioning suggestions throughout session to address pain/fatigue and maximize participation/safety in session.   Functional Transfers: All transfers this session at independent level. Pt simulates walk-in shower transfer with use of threshold, performed at independent level.  Self Care Tasks/Education: Pt gathers all clothing items, bathes, and dresses at Mod I level. Pt and OT dedicate time to discuss the social/emotional impact of a brain injury, provided information on BI support group, and benefits of counseling/therapy to manage resulting stressors. Pt receptive to conversation and information provided.   Pt remained sitting in recliner with 4Ps assessed and immediate needs met. Pt continues to be appropriate for skilled OT intervention to promote further functional independence in ADLs/IADLs.    Session 2:  Pt greeted from direct PT handoff for skilled OT session with focus on discharge planning and comprehensive education.   Pain: Pt with no reports of pain. OT offering intermediate rest breaks and positioning suggestions throughout session to address pain/fatigue and maximize participation/safety in session.   Functional Transfers: All transfers at independent level this session.   Therapeutic Activities:  IADL activity, laundry, simulated/completed at Mod I level.    Education: Pt and OT review multiple educational handouts with focus on energy conservation, and fatigue and emotional changes/challenges post-BI.   Pt remained resting in bed with 4Ps assessed and immediate needs met. Pt continues to be appropriate for skilled OT intervention to promote further functional independence in ADLs/IADLs.   Therapy Documentation Precautions:  Precautions Precautions: Fall Recall of Precautions/Restrictions: Intact Precaution/Restrictions Comments: RUE PICC Restrictions Weight Bearing Restrictions Per Provider Order: No   Therapy/Group: Individual Therapy  Nereida Habermann, OTR/L, MSOT  04/15/2024, 7:53 AM

## 2024-04-15 NOTE — Progress Notes (Signed)
 Occupational Therapy Discharge Summary  Patient Details  Name: Alexa [name] MRN: [id number] Date of Birth: [birth year]  Date of Discharge from OT service:April 15, 2024   Patient has met 7 of 7 long term goals due to improved activity tolerance, improved balance, postural control, ability to compensate for deficits, improved attention, and improved awareness.  Patient to discharge at overall Modified Independent level.  Patient's care partner is independent to provide the necessary physical and cognitive assistance at discharge.    Reasons goals not met: NA  Recommendation:  No OT follow-up recommended at this time.   Equipment: No equipment provided  Reasons for discharge: treatment goals met and discharge from hospital  Patient/family agrees with progress made and goals achieved: Yes  OT Discharge Precautions/Restrictions  Precautions Precautions: Fall Recall of Precautions/Restrictions: Intact Restrictions Weight Bearing Restrictions Per Provider Order: No ADL ADL Equipment Provided: Long-handled sponge Eating: Independent Where Assessed-Eating: Chair Grooming: Modified independent Where Assessed-Grooming: Standing at sink Upper Body Bathing: Modified independent Where Assessed-Upper Body Bathing: Shower Lower Body Bathing: Modified independent Where Assessed-Lower Body Bathing: Shower Upper Body Dressing: Modified independent (Device) Where Assessed-Upper Body Dressing: Edge of bed Lower Body Dressing: Modified independent Where Assessed-Lower Body Dressing: Edge of bed Toileting: Modified independent Where Assessed-Toileting: Toilet, Hotel manager: Community education officer Method: Proofreader: Gaffer: Not assessed Tub/Shower Transfer Method: Unable to assess Psychologist, counselling Transfer: Psychologist, counselling Method: Designer, industrial/product: Administrator, Grab bars ADL Comments: decreased endurance Vision Baseline Vision/History: 1 Wears glasses Wears Glasses: At all times Patient Visual Report: No change from baseline Vision Assessment?: No apparent visual deficits Perception  Perception: Within Functional Limits Praxis Praxis: WFL Cognition Cognition Overall Cognitive Status: Within Functional Limits for tasks assessed Arousal/Alertness: Awake/alert Orientation Level: Person;Place;Situation Memory: Appears intact Awareness: Appears intact Problem Solving: Appears intact Safety/Judgment: Appears intact Brief Interview for Mental Status (BIMS) Repetition of Three Words (First Attempt): 3 Temporal Orientation: Year: Correct Temporal Orientation: Month: Accurate within 5 days Temporal Orientation: Day: Correct Recall: Sock: Yes, no cue required Recall: Blue: Yes, after cueing (a color) Recall: Bed: Yes, no cue required BIMS Summary Score: 14 Sensation Sensation Light Touch: Impaired Detail Peripheral sensation comments: Pt reports peripheral neuropathy, controlled by medications. Hot/Cold: Appears Intact Coordination Gross Motor Movements are Fluid and Coordinated: Yes Fine Motor Movements are Fluid and Coordinated: Yes Motor  Motor Motor: Within Functional Limits Mobility  Transfers Sit to Stand: Independent Stand to Sit: Independent  Trunk/Postural Assessment  Cervical Assessment Cervical Assessment: Within Functional Limits Thoracic Assessment Thoracic Assessment: Within Functional Limits Lumbar Assessment Lumbar Assessment: Within Functional Limits Postural Control Postural Control: Deficits on evaluation Protective Responses: Decreased  Balance Balance Balance Assessed: Yes Static Sitting Balance Static Sitting - Balance Support: No upper extremity supported;Feet supported Static Sitting - Level of Assistance: 7: Independent Dynamic Sitting Balance Dynamic Sitting - Balance Support: Feet  supported Dynamic Sitting - Level of Assistance: 6: Modified independent (Device/Increase time) Static Standing Balance Static Standing - Balance Support: During functional activity Static Standing - Level of Assistance: 7: Independent Dynamic Standing Balance Dynamic Standing - Balance Support: During functional activity Dynamic Standing - Level of Assistance: 7: Independent Extremity/Trunk Assessment RUE Assessment RUE Assessment: Within Functional Limits LUE Assessment LUE Assessment: Within Functional Limits   Nereida Habermann, OTR/L, MSOT  04/15/2024, 7:53 AM

## 2024-04-15 NOTE — Progress Notes (Signed)
 Physical Therapy Discharge Summary  Patient Details  Name: Alexa [name] MRN: [id number] Date of Birth: 16-Sep-1970  Date of Discharge from PT service:April 15, 2024  Today's Date: 04/15/2024 PT Individual Time: 8964-8884 PT Individual Time Calculation (min): 40 min    Patient has met 7 of 7 long term goals due to improved activity tolerance, improved balance, improved postural control, increased strength, decreased pain, and improved coordination.  Patient to discharge at an ambulatory level Independent.   Patient's care partner is independent to provide the necessary physical assistance at discharge.  Reasons goals not met: N/A  Recommendation:  Patient will benefit from ongoing skilled PT services in outpatient setting to continue to advance safe functional mobility, address ongoing impairments in dynamic standing balance, BLE strengthening and decrease pain with mobility, and minimize fall risk.  Equipment: No equipment provided  Reasons for discharge: treatment goals met and discharge from hospital  Patient/family agrees with progress made and goals achieved: Yes  PT Discharge Skilled Interventions: Pt presents in room in bed, agreeable to PT. Pt denies pain. Session focused on therapeutic activities for discharge planning with emphasis on endurance, ongoing deficits, HEP prescription, and current functional status. Pt independent with bed mobility, transfers, gait over even and uneven surfaces, and stair negotiation with BHRs x12 steps as well as picking up objects off ground from standing. Pt completes 900' which is less than age related norms. Pt provided with education on continued PT services to return to baseline function as well as decrease pain with mobility. Pt provided with HEP handout with exercises completed in previous sessions including: Access Code: [email or web address] URL: [email or web address] Date: 04/15/2024 Prepared by: Reche Ohara  Exercises - Side Stepping with Resistance at Thighs  - 1 x daily - 7 x weekly - 3 sets - 10 reps - Forward Monster Walks  - 1 x daily - 7 x weekly - 3 sets - 10 reps - Backward Monster Walks  - 1 x daily - 7 x weekly - 3 sets - 10 reps - Sit to Stand with Resistance Around Legs  - 1 x daily - 7 x weekly - 3 sets - 10 reps Pt provided with education on how to complete exercises safely as well as frequency of completing exercises and purpose of exercise with pt verbalizing understanding. Pt with handoff to OT in hallway ambulating independently at end of session.  Precautions/Restrictions Precautions Precautions: Fall Recall of Precautions/Restrictions: Intact Restrictions Weight Bearing Restrictions Per Provider Order: No Pain Interference Pain Interference Pain Effect on Sleep: 1. Rarely or not at all Pain Interference with Therapy Activities: 1. Rarely or not at all Pain Interference with Day-to-Day Activities: 1. Rarely or not at all Vision/Perception  Vision - History Ability to See in Adequate Light: 0 Adequate Perception Perception: Within Functional Limits Praxis Praxis: WFL  Cognition Overall Cognitive Status: Within Functional Limits for tasks assessed Arousal/Alertness: Awake/alert Memory: Appears intact Awareness: Appears intact Problem Solving: Appears intact Safety/Judgment: Appears intact Sensation Sensation Light Touch: Impaired Detail Peripheral sensation comments: Pt reports peripheral neuropathy, controlled by medications. Hot/Cold: Appears Intact Proprioception: Not tested Stereognosis: Not tested Additional Comments: Rt sided neuropathy, but medication has been helping per pt report Coordination Gross Motor Movements are Fluid and Coordinated: Yes Fine Motor Movements are Fluid and Coordinated: Yes Coordination and Movement Description: slight coordination deficits secondary to deconditioning, improved from eval Motor  Motor Motor: Within  Functional Limits Motor - Discharge Observations: mild deficits secondary to deconditioning, improved from eval  Mobility  Bed Mobility Bed Mobility: Sit to Supine;Supine to Sit Supine to Sit: Independent Sit to Supine: Independent Transfers Transfers: Sit to Stand;Stand to Sit;Stand Pivot Transfers Sit to Stand: Independent Stand to Sit: Independent Stand Pivot Transfers: Independent Transfer (Assistive device): None Locomotion  Gait Ambulation: Yes Gait Assistance: Independent Gait Distance (Feet): 500 Feet Assistive device: None Gait Gait: Yes Gait Pattern: Impaired Gait Pattern: Narrow base of support;Decreased stride length Gait velocity: decreased Stairs / Additional Locomotion Stairs: Yes Stairs Assistance: Independent Stair Management Technique: Two rails Number of Stairs: 12 Height of Stairs: 6 Ramp: Independent Curb: Independent Corporate treasurer: No  Trunk/Postural Assessment  Cervical Assessment Cervical Assessment: Within Functional Limits Thoracic Assessment Thoracic Assessment: Within Functional Limits Lumbar Assessment Lumbar Assessment: Within Functional Limits Postural Control Postural Control: Deficits on evaluation Protective Responses: Decreased  Balance Balance Balance Assessed: Yes Timed Up and Go Test TUG: Normal TUG Normal TUG (seconds): 11.51 Static Sitting Balance Static Sitting - Balance Support: No upper extremity supported;Feet supported Static Sitting - Level of Assistance: 7: Independent Dynamic Sitting Balance Dynamic Sitting - Balance Support: Feet supported Dynamic Sitting - Level of Assistance: 7: Independent Static Standing Balance Static Standing - Balance Support: During functional activity Static Standing - Level of Assistance: 7: Independent Dynamic Standing Balance Dynamic Standing - Balance Support: During functional activity Dynamic Standing - Level of Assistance: 7: Independent Extremity  Assessment  RUE Assessment RUE Assessment: Within Functional Limits LUE Assessment LUE Assessment: Within Functional Limits RLE Assessment RLE Assessment: Exceptions to [facility] General Strength Comments: grossly 4/5 tested in sitting LLE Assessment LLE Assessment: Within Functional Limits General Strength Comments: grossly 5/5 tested in sitting   Reche Ohara PT, DPT 04/15/2024, 11:01 AM

## 2024-04-15 NOTE — Progress Notes (Signed)
 Patient ID: Alexa [name], female   DOB: Jul 13, 1971, 53 y.o.   MRN: [id number]  Per attending, pt can d/c to home today after therapy.  SW met with pt during OT session discussing above. SW discussed outpatient PT. SW will send referral to [facility] location.    SW faxed referral.   Graeme Jude, MSW, LCSW Office: [phone] Cell: [phone] Fax: [phone]

## 2024-04-15 NOTE — Discharge Summary (Signed)
 Physician Discharge Summary  Patient ID: Alexa [name] MRN: [id number] DOB/AGE: 1970/11/27 53 y.o.  Admit date: 04/08/2024 Discharge date: 04/15/2024  Discharge Diagnoses:  Principal Problem:   Meningitis Active Problems:   Status post craniotomy   Essential hypertension   Hypokalemia   Neutropenia (HCC)   ABLA (acute blood loss anemia)   OAB (overactive bladder)   Paresthesias   Discharged Condition: stable  Significant Diagnostic Studies: DG HIP UNILAT WITH PELVIS 2-3 VIEWS LEFT Result Date: 04/11/2024 CLINICAL DATA:  Left hip pain. EXAM: DG HIP (WITH OR WITHOUT PELVIS) 2-3V LEFT COMPARISON:  None Available. FINDINGS: No acute fracture or dislocation. The bones are well mineralized. Mild bilateral hip and symphysis previous arthritic changes. The soft tissues are unremarkable. A catheter is noted over the pelvis. IMPRESSION: 1. No acute fracture or dislocation. 2. Mild arthritic changes. Electronically Signed   By: Vanetta Chou M.D.   On: 04/11/2024 15:11    Labs:  Basic Metabolic Panel: Recent Labs  Lab 04/09/24 0835 04/10/24 0352 04/11/24 0400 04/12/24 0330 04/13/24 0418 04/14/24 0508 04/15/24 0534  NA 139  --  139  --   --   --  138  K 3.3*  --  3.6  --   --   --  3.7  CL 107  --  104  --   --   --  104  CO2 24  --  26  --   --   --  26  GLUCOSE 124*  --  104*  --   --   --  105*  BUN 6  --  5*  --   --   --  8  CREATININE 0.68  --  0.81  --   --   --  0.74  CALCIUM  9.2  --  9.4  --   --   --  9.2  MG 1.9 1.9 2.0 2.0 2.0 1.9 1.8    CBC: Recent Labs  Lab 04/09/24 0835 04/11/24 0400 04/15/24 0534  WBC 4.8 3.3* 2.4*  NEUTROABS 2.5  --   --   HGB 8.4* 8.1* 8.8*  HCT 26.7* 25.7* 27.8*  MCV 88.1 88.0 87.4  PLT 559* 455* 316    CBG: No results for input(s): GLUCAP in the last 168 hours.  Brief HPI:   Alexa [name] is a 53 y.o. female with history of anemia, HTN, GERD, recurrent meningioma with a headache s/p resection 02/17/2024 complicated by  CSF leak requiring shunt placement and was readmitted to [facility] on 04/01/2024 with lumbar pain fever and onset of right foot paresthesias.  MRI lumbar spine showed heterogenously enhancing mass posterior to S2 body compatible with appendicitis and ill-defined mass lower lumbar spine and dural enhancement along lower cord concerning for infection.  Neurosurgery felt dual enhancement concerning for meningitis and LP done revealing WBC 1040 with segmented neutrophils glucose less than 20 and TP 563.  She was started on Vanco and cefepime  with Dr. Babara recommended 2-week course of antibiotics for meningitis.  Gram stain and cultures negative.  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  CTAP showed known enhancing mass posterior to ST not well-appreciated and VP shunt without significant free fluid.  Patient continued to have significant amount of pain in low back and BLE as well as fluid from wound and shunt was dialed down to 60 mm H20.  Pain was improving but she continued to have decrease in activity  tolerance.  She was requiring mod assist with ADLs and contact-guard assist for mobility.  She was independent prior to surgery and CIR was recommended due to recent functional decline.   Hospital Course: Alexa [name] was admitted to rehab 04/08/2024 for inpatient therapies to consist of PT and OT at least three hours five days a week. Past admission physiatrist, therapy team and rehab RN have worked together to provide customized collaborative inpatient rehab.SCDs were used for DVT prophylaxis.  She has had complaints of left hip pain and x-rays done was negative for fracture and showed mild OA.  Pain has been reasonably controlled with as needed use of meds.  She declined use of K-pad for local measures.  She reported issues with frequency with PVRs showing no signs of retention and Myrbetriq  was added to help manage symptoms.  Her  blood pressures were monitored on TID basis and hydrochlorothiazide  was resumed.   She was maintained on Vanco and cefepime  through 9/14 and has tolerated this without side effects.  Diarrhea resolved with discontinuation of laxatives.  Dr. Gillie was consulted for input on staple and sutures and this was removed on 9/12.  Follow-up CBC showed H&H to be relatively stable at 8.8.  Fasting hyperglycemia noted and hemoglobin A1c was noted to be within normal limits @5 .2.  Teds and elevations were recommended to help manage RLE edema as Dopplers of 09/2 were negative for DVT. Follow up BMET showed hypokalemia which has resolved with supplementation and renal status has been WNL>   Rehab course: During patient's stay in rehab weekly team conferences were held to monitor patient's progress, set goals and discuss barriers to discharge.  At admission she requires CGA to SBA  with ADL tasks and min assist with mobility. She  has had improvement in activity tolerance, balance, postural control as well as ability to compensate for deficits.  She is able to complete ADL tasks at modified independent level.  She is modified independent for transfers and is able to ambulate 500 feet without assistive device.  She is able to climb 12 stairs with 2 rails independently.    Discharge disposition: 01-Home or Self Care  Diet: Regular  Special Instructions: Appointment with Dr. Overton 09/18  2. No Driving or strenuous activity till cleared by MD. 3. Recommend repeat BMET/CBC in 1-2 weeks to monitor WBC and potassium levels.   Allergies as of 04/15/2024       Reactions   Watermelon [citrullus Vulgaris] Nausea And Vomiting        Medication List     STOP taking these medications    ceFEPime  IVPB Commonly known as: MAXIPIME    escitalopram  20 MG tablet Commonly known as: LEXAPRO    ibuprofen  200 MG tablet Commonly known as: ADVIL    OVER THE COUNTER MEDICATION   oxyCODONE  5 MG immediate release  tablet Commonly known as: Oxy IR/ROXICODONE    vancomycin  IVPB       TAKE these medications    acetaminophen  325 MG tablet Commonly known as: TYLENOL  Take 2 tablets (650 mg total) by mouth in the morning, at noon, and at bedtime. What changed:  how much to take when to take this reasons to take this   atenolol  50 MG tablet Commonly known as: TENORMIN  Take 1 tablet (50 mg total) by mouth daily.   docusate sodium  100 MG capsule Commonly known as: COLACE Take 1 capsule (100 mg total) by mouth 2 (two) times daily.   FT Fiber Laxative 625 MG tablet Generic drug:  polycarbophil Take 1 tablet (625 mg total) by mouth 2 (two) times daily.   gabapentin  400 MG capsule Commonly known as: NEURONTIN  Take 1 capsule (400 mg total) by mouth 3 (three) times daily.   hydrochlorothiazide  12.5 MG tablet Commonly known as: HYDRODIURIL  Take 1 tablet (12.5 mg total) by mouth daily.   methocarbamol  500 MG tablet Commonly known as: ROBAXIN  Take 1 tablet (500 mg total) by mouth 3 (three) times daily. What changed:  when to take this reasons to take this   Myrbetriq  25 MG Tb24 tablet Generic drug: mirabegron  ER Take 1 tablet (25 mg total) by mouth daily.   SODIUM FLUORIDE  (DENTAL RINSE) 0.2 % Soln Commonly known as: PreviDent  USE AS AN ORAL RINSE, AS DIRECTED ON PACKAGE        Follow-up Information     Gillie Duncans, MD Follow up.   Specialty: Neurosurgery Why: Call in 1-2 days for post hospital follow up Contact information: [address] [place] [address] [phone]         Emeline Search C, DO Follow up.   Specialty: Physical Medicine and Rehabilitation Why: office will call you with follow up appointment Contact information: [address] [place] [address] [phone]         Tisovec, Charlie ORN, MD Follow up.   Specialty: Internal Medicine Why: Call in 1-2 days for post hospital follow up Contact information: [address] [place] [address] [phone]                 Signed: Sharlet GORMAN Schmitz 04/20/2024, 11:20 PM

## 2024-04-16 ENCOUNTER — Telehealth: Payer: Self-pay

## 2024-04-16 LAB — CSF CULTURE W GRAM STAIN: Culture: NO GROWTH

## 2024-04-16 NOTE — Telephone Encounter (Addendum)
 Transitional Care Call--who you spoke with Alexa [name] (Patient)   Are you/is patient experiencing any problems since coming home? No. Are there any questions regarding any aspect of care? No. Are there any questions regarding medications administration/dosing? No. Are meds being taken as prescribed? Yes.  Patient should review meds with caller to confirm. Medications confirmed. Have there been any falls? No.  Has Home Health been to the house and/or have they contacted you? No need.  If not, have you tried to contact them? No assistance needed. Can we help you contact them?  N/a Are bowels and bladder emptying properly? Yes.  Are there any unexpected incontinence issues?  No. If applicable, is patient following bowel/bladder programs? N/a. Any fevers, problems with breathing, unexpected pain? No. Are there any skin problems or new areas of breakdown? No. Has the patient/family member arranged specialty MD follow up (ie cardiology/neurology/renal/surgical/etc)? Appointments reviewed.   Can we help arrange? N/a. Does the patient need any other services or support that we can help arrange? No.  Are caregivers following through as expected in assisting the patient? Patient stated she is independent.         11. Has the patient quit smoking, drinking alcohol , or using drugs as recommended? Advised.   Appointment  [address] with Fidela Ned NP on 04/29/2024 at 10:40 am.

## 2024-04-16 NOTE — Progress Notes (Signed)
 Inpatient Rehabilitation Care Coordinator Discharge Note   Patient Details  Name: CIEARA STIERWALT MRN: [id number] Date of Birth: Jul 10, 1971   Discharge location: D/c to home  Length of Stay: 6 days  Discharge activity level: Independemt  Home/community participation: Limited  Patient response un:Yzjouy Literacy - How often do you need to have someone help you when you read instructions, pamphlets, or other written material from your doctor or pharmacy?: Rarely  Patient response un:Dnrpjo Isolation - How often do you feel lonely or isolated from those around you?: Rarely  Services provided included: MD, RD, PT, OT, SLP, RN, CM, TR, Pharmacy, SW, Neuropsych  Financial Services:  Field seismologist Utilized: Private Insurance [place]  Choices offered to/list presented to: patient  Follow-up services arranged:  Outpatient    Outpatient Servicies: [facility] for PT only      Patient response to transportation need: Is the patient able to respond to transportation needs?: Yes In the past 12 months, has lack of transportation kept you from medical appointments or from getting medications?: No In the past 12 months, has lack of transportation kept you from meetings, work, or from getting things needed for daily living?: No   Patient/Family verbalized understanding of follow-up arrangements:  Yes  Individual responsible for coordination of the follow-up plan: contact pt  Confirmed correct DME delivered: Graeme DELENA Jude 04/16/2024    Comments (or additional information):  Summary of Stay    Date/Time Discharge Planning CSW  04/09/24 0954 TBA AAC       Kaprice Kage A Jude

## 2024-04-18 ENCOUNTER — Ambulatory Visit (INDEPENDENT_AMBULATORY_CARE_PROVIDER_SITE_OTHER): Admitting: Internal Medicine

## 2024-04-18 ENCOUNTER — Other Ambulatory Visit: Payer: Self-pay

## 2024-04-18 ENCOUNTER — Encounter: Payer: Self-pay | Admitting: Internal Medicine

## 2024-04-18 VITALS — BP 119/84 | HR 68 | Temp 97.9°F | Ht 68.0 in | Wt 211.0 lb

## 2024-04-18 DIAGNOSIS — G039 Meningitis, unspecified: Secondary | ICD-10-CM

## 2024-04-18 NOTE — Progress Notes (Signed)
 [facility]  Patient Active Problem List   Diagnosis Date Noted   Meningitis 04/09/2024   Meningitis spinal 04/02/2024   Sepsis (HCC) 04/02/2024   Postoperative CSF leak 03/24/2024   CSF leak 03/17/2024   Status post craniotomy 02/21/2024   Meningioma, recurrent of brain (HCC) 02/21/2024   S/P craniotomy 02/16/2024   Social anxiety disorder 12/25/2016   Severe episode of recurrent major depressive disorder, without psychotic features (HCC) 12/25/2016   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      Subjective:    Patient ID: [name], female    DOB: 07-02-1971, 53 y.o.   MRN: [id number]  Chief Complaint  Patient presents with   Follow-up    HPI:  JALIA ZUNIGA is a 53 y.o. female here for hospital f/u for nosocomial meningitis   She is doing well baseline outside of mild weakness overall No headache No back pain Appetite is great She does take a muscle relaxant and gabapentin ; she asks if she should be taking muscle relaxant  Finished iv abx of 2 weeks on 9/15 and picc removed     Allergies  Allergen Reactions   Watermelon [Citrullus Vulgaris] Nausea And Vomiting      Outpatient Medications Prior to Visit  Medication Sig Dispense Refill   acetaminophen  (TYLENOL ) 325 MG tablet Take 2 tablets (650 mg total) by mouth in the morning, at noon, and at bedtime.     atenolol  (TENORMIN ) 50 MG tablet Take 1 tablet (50 mg total) by mouth daily. 90 tablet 3   gabapentin  (NEURONTIN ) 400 MG capsule Take 1 capsule (400 mg total) by mouth 3 (three) times daily. 90 capsule 0   hydrochlorothiazide  (HYDRODIURIL ) 12.5 MG tablet Take 1 tablet (12.5 mg total) by mouth daily. 90 tablet 3   methocarbamol  (ROBAXIN ) 500 MG tablet Take 1 tablet (500 mg total) by mouth 3 (three) times daily. 90 tablet 0   docusate sodium  (COLACE) 100 MG capsule Take 1 capsule (100 mg total) by mouth 2 (two) times daily. (Patient not taking: Reported on  04/18/2024) 60 capsule 0   mirabegron  ER (MYRBETRIQ ) 25 MG TB24 tablet Take 1 tablet (25 mg total) by mouth daily. 30 tablet 0   polycarbophil (FIBERCON) 625 MG tablet Take 1 tablet (625 mg total) by mouth 2 (two) times daily. (Patient not taking: Reported on 04/18/2024) 60 tablet 0   SODIUM FLUORIDE , DENTAL RINSE, (PREVIDENT ) 0.2 % SOLN USE AS AN ORAL RINSE, AS DIRECTED ON PACKAGE (Patient not taking: Reported on 04/18/2024) 473 mL 5   No facility-administered medications prior to visit.     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     Employer: [employer]  Tobacco Use   Smoking status: Never   Smokeless tobacco: Never  Vaping Use   Vaping status: Never Used  Substance and Sexual Activity   Alcohol  use: Not Currently    Comment: socially, maybe a glass every few months   Drug use: No   Sexual activity: Yes  Other Topics Concern   Not on file  Social History Narrative   Not on file   Social Drivers of Health   Financial Resource Strain: Not on file  Food Insecurity: No Food Insecurity (04/03/2024)   Hunger Vital Sign    Worried About Running Out of Food in the Last Year: Never true    Ran Out of Food in the Last Year: Never true  Transportation Needs: No Transportation Needs (04/03/2024)   PRAPARE - Administrator, Civil Service (Medical): No    Lack of Transportation (Non-Medical): No  Physical Activity: Not on file  Stress: Not on file  Social Connections: Not on file  Intimate Partner Violence: Not At Risk (04/03/2024)   Humiliation, Afraid, Rape, and Kick questionnaire    Fear of Current or Ex-Partner: No    Emotionally Abused: No    Physically Abused: No    Sexually Abused: No      Review of Systems    All other ros negative including  n/v/d Objective:    BP 119/84   Pulse 68   Temp 97.9 F (36.6 C) (Temporal)   Ht 5' 8 (1.727 m)   Wt 211 lb (95.7 kg)   SpO2 100%   BMI 32.08 kg/m  Nursing note and vital signs reviewed.  Physical Exam     General/constitutional: no distress, pleasant HEENT: Normocephalic, PER, Conj Clear, EOMI, Oropharynx clear Neck supple CV: rrr no mrg Lungs: clear to auscultation, normal respiratory effort Abd: Soft, Nontender Ext: no edema Skin: No Rash Neuro: nonfocal MSK: no peripheral joint swelling/tenderness/warmth; back spines nontender   Central line presence: none   Labs: Lab Results  Component Value Date   WBC 2.4 (L) 04/15/2024   HGB 8.8 (L) 04/15/2024   HCT 27.8 (L) 04/15/2024   MCV 87.4 04/15/2024   PLT 316 04/15/2024   Last metabolic panel Lab Results  Component Value Date   GLUCOSE 105 (H) 04/15/2024   NA 138 04/15/2024   K 3.7 04/15/2024   CL 104 04/15/2024   CO2 26 04/15/2024   BUN 8 04/15/2024   CREATININE 0.74 04/15/2024   GFRNONAA >60 04/15/2024   CALCIUM  9.2 04/15/2024   PHOS 4.3 04/03/2024   PROT 6.3 (L) 04/09/2024   ALBUMIN  2.9 (L) 04/09/2024   BILITOT 0.4 04/09/2024   ALKPHOS 40 04/09/2024   AST 18 04/09/2024   ALT 16 04/09/2024   ANIONGAP 8 04/15/2024    Micro:  Serology:  Imaging:  Assessment & Plan:   Problem List Items Addressed This Visit     Meningitis - Primary      No orders of the defined types were placed in this encounter.  53 yo female recent meningioma resection complicated by csf leak and had required shunt 03/24/24, then by severe back pain admitted 04/01/24 with also fever and mri concerning for lumbar meningitis where lvd was.  Started on empiric abx Lp 1 day post abx and cx negative; glucose low   Nosocomial meningitis S/p vp shunt placement      Culture Result: csf cx remains no grow to date; had asked lab to keep 2 weeks for p-acnes growth but nothing grew     Patient had fever on presentation and  lumbar pain with mri meningitis. Cx negative from lp, but glucose very low     04/18/24 id clinic assessment Just finished abx.  A little early to tell from my standpoint in terms of post-abx check for infection cure 2 weeks kept for cx and still negative  Advise for the next 4 weeks monitor for back pain/headache, confusion, n/v, visual change, fever, chill and if present call me and nsg asap  No need to make any other appointment at this time yet    Follow-up: Return if symptoms worsen or fail to improve.      Constance ONEIDA Passer, MD [facility] 04/18/2024, 10:03 AM

## 2024-04-18 NOTE — Patient Instructions (Signed)
 for the next 4 weeks monitor for back pain/headache, confusion, n/v, visual change, fever, chill and if present call me and nsg asap  No need to make any other appointment at this time yet

## 2024-04-20 DIAGNOSIS — D62 Acute posthemorrhagic anemia: Secondary | ICD-10-CM | POA: Insufficient documentation

## 2024-04-20 DIAGNOSIS — D709 Neutropenia, unspecified: Secondary | ICD-10-CM | POA: Insufficient documentation

## 2024-04-20 DIAGNOSIS — E876 Hypokalemia: Secondary | ICD-10-CM | POA: Insufficient documentation

## 2024-04-20 DIAGNOSIS — R202 Paresthesia of skin: Secondary | ICD-10-CM | POA: Insufficient documentation

## 2024-04-20 DIAGNOSIS — I10 Essential (primary) hypertension: Secondary | ICD-10-CM | POA: Insufficient documentation

## 2024-04-20 DIAGNOSIS — N3281 Overactive bladder: Secondary | ICD-10-CM | POA: Insufficient documentation

## 2024-04-24 DIAGNOSIS — F411 Generalized anxiety disorder: Secondary | ICD-10-CM | POA: Diagnosis not present

## 2024-04-25 ENCOUNTER — Telehealth (HOSPITAL_COMMUNITY): Admitting: Psychiatry

## 2024-04-25 ENCOUNTER — Other Ambulatory Visit (HOSPITAL_COMMUNITY): Payer: Self-pay

## 2024-04-25 ENCOUNTER — Encounter (HOSPITAL_COMMUNITY): Payer: Self-pay | Admitting: Psychiatry

## 2024-04-25 DIAGNOSIS — F410 Panic disorder [episodic paroxysmal anxiety] without agoraphobia: Secondary | ICD-10-CM

## 2024-04-25 DIAGNOSIS — F332 Major depressive disorder, recurrent severe without psychotic features: Secondary | ICD-10-CM

## 2024-04-25 DIAGNOSIS — F4001 Agoraphobia with panic disorder: Secondary | ICD-10-CM

## 2024-04-25 DIAGNOSIS — F4010 Social phobia, unspecified: Secondary | ICD-10-CM

## 2024-04-25 MED ORDER — BUPROPION HCL ER (SR) 150 MG PO TB12
150.0000 mg | ORAL_TABLET | Freq: Every day | ORAL | 0 refills | Status: AC
  Filled 2024-04-25: qty 30, 30d supply, fill #0

## 2024-04-25 MED ORDER — ESCITALOPRAM OXALATE 20 MG PO TABS
40.0000 mg | ORAL_TABLET | Freq: Every day | ORAL | 0 refills | Status: AC
  Filled 2024-04-25: qty 60, 30d supply, fill #0

## 2024-04-25 NOTE — Progress Notes (Signed)
 [facility] Outpatient Follow up visit  Patient Identification: Alexa [name] MRN:  [id number] Date of Evaluation:  04/25/2024 Referral Source: Counsellor Chief Complaint:   depression follow up . Visit Diagnosis:    ICD-10-CM   1. Severe episode of recurrent major depressive disorder, without psychotic features (HCC)  F33.2     2. Social anxiety disorder  F40.10     3. Panic attacks  F41.0     Virtual Visit via Video Note  I connected with [name] on 04/25/24 at  9:40 AM EDT by a video enabled telemedicine application and verified that I am speaking with the correct person using two identifiers.  Location: Patient: home Provider: home office   I discussed the limitations of evaluation and management by telemedicine and the availability of in person appointments. The patient expressed understanding and agreed to proceed.     I discussed the assessment and treatment plan with the patient. The patient was provided an opportunity to ask questions and all were answered. The patient agreed with the plan and demonstrated an understanding of the instructions.   The patient was advised to call back or seek an in-person evaluation if the symptoms worsen or if the condition fails to improve as anticipated.  I provided 20 minutes of non-face-to-face time during this encounter.     History of Present Illness:  53  years old currently single African-American female initially referred by our counselor for management of depression and anxiety. Works with [employer]  Patient lost her great Aunt she was care giving for her recovering from grief  Also had recent brain tumor surgery, got complicated with meningitis. Is recovering and has sisters support but remains somewhat down and tired. Handling post op but not ready to go back to work  Meds helping currently on wellbutrin  lexapro  Started therapy provided post surgery for counselling and will work on coping  with stressors  Anxiety fluctuates but more relevant to recent surgery and not as mobile as before   Aggravating factors. Brain tumor.mom  can be sick Modifying factors : family Severity of depression: somewhat subdued Social anxiety ; somewhat subdued   Past Psychiatric History: SSRI treatment for depression and anxiety  Previous Psychotropic Medications: Yes   Substance Abuse History in the last 12 months:  No.  Consequences of Substance Abuse: NA  Past Medical History:  Past Medical History:  Diagnosis Date   Anemia    Anxiety    Depression    GERD (gastroesophageal reflux disease)    Headache    Hypertension    Meningioma (HCC)    PONV (postoperative nausea and vomiting)     Past Surgical History:  Procedure Laterality Date   APPLICATION OF CRANIAL NAVIGATION N/A 02/21/2024   Procedure: COMPUTER-ASSISTED NAVIGATION, FOR CRANIAL PROCEDURE;  Surgeon: Gillie Duncans, MD;  [facility];  Service: Neurosurgery;  Laterality: N/A;   CRANIOTOMY  05/26/2016   Procedure: SUBOCCIPITAL CRANIOTOMY WITH PLACEMENT OF VENTRICULAR CATHETER;  Surgeon: Duncans Gillie, MD;  [facility] OR;  Service: Neurosurgery;;  CRANIOTOMY - SUBOCCIPITAL   CRANIOTOMY Left 02/21/2024   Procedure: CRANIOTOMY TUMOR EXCISION;  Surgeon: Gillie Duncans, MD;  Location[facility] OR;  Service: Neurosurgery;  Laterality: Left;  Occipital - left Craniotomy for tumor resection with Stealth   CRANIOTOMY Left 02/26/2024   Procedure: CRANIOTOMY TUMOR EXCISION;  Surgeon: Gillie Duncans, MD;  Location: [facility] OR;  Service: Neurosurgery;  Laterality: Left;  Craniotomy for tumor Stage II   LUMBAR WOUND DEBRIDEMENT  N/A 03/18/2024   Procedure: WOUND DEBRIDEMENT;  Surgeon: Gillie Duncans, MD;  Location[facility] OR;  Service: Neurosurgery;  Laterality: N/A;  Wound revision at cranial site   PLACEMENT OF LUMBAR DRAIN N/A 03/18/2024   Procedure: PLACEMENT OF LUMBAR DRAIN;  Surgeon: Gillie Duncans, MD;  [facility];  Service: Neurosurgery;   Laterality: N/A;  Placement of Lumbar Drain   VENTRICULOPERITONEAL SHUNT Right 03/24/2024   Procedure: SHUNT INSERTION VENTRICULAR-PERITONEAL With Laparoscopic verification of shunt placement into the peritoneum;  Surgeon: Gillie Duncans, MD;  Location: [facility] OR;  Service: Neurosurgery;  Laterality: Right;   WISDOM TOOTH EXTRACTION      Family Psychiatric History: father side : depression most family members  Family History:  Family History  Problem Relation Age of Onset   Hypertension Mother    Heart disease Mother    Hypertension Father    Kidney disease Father    Ulcers Father    Diabetes Father        many paternal family members also with diabetes   Ovarian cancer Maternal Grandmother    Cirrhosis Paternal Grandfather        alcohol  related   Thyroid  cancer Maternal Aunt    Colon cancer Neg Hx    Esophageal cancer Neg Hx    Pancreatic cancer Neg Hx    Stomach cancer Neg Hx     Social History:   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     Employer: [employer]  Tobacco Use   Smoking status: Never   Smokeless tobacco: Never  Vaping Use   Vaping status: Never Used  Substance and Sexual Activity   Alcohol  use: Not Currently    Comment: socially, maybe a glass every few months   Drug use: No   Sexual activity: Yes  Other Topics Concern   Not on file  Social History Narrative   Not on file   Social Drivers of Health   Financial Resource Strain: Not on file  Food Insecurity: No Food Insecurity (04/03/2024)   Hunger Vital Sign    Worried About Running Out of Food in the Last Year: Never true    Ran Out of Food in the Last Year: Never true  Transportation Needs: No Transportation Needs (04/03/2024)   PRAPARE - Administrator, Civil Service  (Medical): No    Lack of Transportation (Non-Medical): No  Physical Activity: Not on file  Stress: Not on file  Social Connections: Not on file      Allergies:   Allergies  Allergen Reactions   Watermelon [Citrullus Vulgaris] Nausea And Vomiting    Metabolic Disorder Labs: Lab Results  Component Value Date   HGBA1C 5.2 04/09/2024   MPG 102.54 04/09/2024   No results found for: PROLACTIN No results found for: CHOL, TRIG, HDL, CHOLHDL, VLDL, LDLCALC   Current Medications: Current Outpatient Medications  Medication Sig Dispense Refill   buPROPion  (WELLBUTRIN  SR) 150 MG 12 hr tablet Take 1 tablet (150 mg total) by mouth daily. 30 tablet 0   escitalopram  (LEXAPRO ) 20 MG tablet Take 2 tablets (40 mg total) by mouth daily. 60 tablet 0   acetaminophen  (TYLENOL ) 325 MG tablet Take 2 tablets (650 mg total) by mouth in the  morning, at noon, and at bedtime.     atenolol  (TENORMIN ) 50 MG tablet Take 1 tablet (50 mg total) by mouth daily. 90 tablet 3   docusate sodium  (COLACE) 100 MG capsule Take 1 capsule (100 mg total) by mouth 2 (two) times daily. (Patient not taking: Reported on 04/18/2024) 60 capsule 0   gabapentin  (NEURONTIN ) 400 MG capsule Take 1 capsule (400 mg total) by mouth 3 (three) times daily. 90 capsule 0   hydrochlorothiazide  (HYDRODIURIL ) 12.5 MG tablet Take 1 tablet (12.5 mg total) by mouth daily. 90 tablet 3   methocarbamol  (ROBAXIN ) 500 MG tablet Take 1 tablet (500 mg total) by mouth 3 (three) times daily. 90 tablet 0   mirabegron  ER (MYRBETRIQ ) 25 MG TB24 tablet Take 1 tablet (25 mg total) by mouth daily. 30 tablet 0   polycarbophil (FIBERCON) 625 MG tablet Take 1 tablet (625 mg total) by mouth 2 (two) times daily. (Patient not taking: Reported on 04/18/2024) 60 tablet 0   SODIUM FLUORIDE , DENTAL RINSE, (PREVIDENT ) 0.2 % SOLN USE AS AN ORAL RINSE, AS DIRECTED ON PACKAGE (Patient not taking: Reported on 04/18/2024) 473 mL 5   No current facility-administered  medications for this visit.      Psychiatric Specialty Exam: Review of Systems  Cardiovascular:  Negative for chest pain.  Neurological:  Negative for tremors.  Psychiatric/Behavioral:  Negative for substance abuse.     There were no vitals taken for this visit.There is no height or weight on file to calculate BMI.  General Appearance: casual  Eye Contact:   Speech:  Normal Rate  Volume:  Decreased  Mood: somewhat subdued  Affect:    Thought Process:  Goal Directed  Orientation:  Full (Time, Place, and Person)  Thought Content:  Rumination  Suicidal Thoughts:  No  Homicidal Thoughts:  No  Memory:  Immediate;   Fair Recent;   Fair  Judgement:  Fair  Insight:  Shallow  Psychomotor Activity:  Decreased  Concentration:  Concentration: Fair and Attention Span: Fair  Recall:  [employer] of Knowledge:Fair  Language: Fair  Akathisia:  Negative  Handed:  Right  AIMS (if indicated):    Assets:  Social Support  ADL's:  Intact  Cognition: WNL  Sleep:  fair    Treatment Plan Summary: Medication management and Plan as follows  Prior documentation reviewed  1. Major depression recurrent : Somewhat subdued but also recovering from surgery discussed to add more activities and keeping up with a good support system she is okay with the medications are we will continue Lexapro  40 mg and Wellbutrin  150 mg SR she has added therapy to work on coping skills She agrees with the plan of keeping medication the same as she is recovering from surgery and that may have been contributing to her somewhat tiredness and low mood she is not hopeless or suicidal remains to have a good support system  2. SOcial anxiety:; Baseline continue Lexapro    3. Panic attacks: Baseline continue Lexapro  has added therapy continue breathing techniques if needed   Fu 3 to 4 months. Renewed meds, call for concerns earlier if needed  Jackey Flight, MD 9/25/202510:25 AM

## 2024-04-29 ENCOUNTER — Encounter: Attending: Registered Nurse | Admitting: Registered Nurse

## 2024-05-03 DIAGNOSIS — F419 Anxiety disorder, unspecified: Secondary | ICD-10-CM | POA: Diagnosis not present

## 2024-05-03 DIAGNOSIS — Z48811 Encounter for surgical aftercare following surgery on the nervous system: Secondary | ICD-10-CM | POA: Diagnosis not present

## 2024-05-03 DIAGNOSIS — Z982 Presence of cerebrospinal fluid drainage device: Secondary | ICD-10-CM | POA: Diagnosis not present

## 2024-05-03 DIAGNOSIS — F32A Depression, unspecified: Secondary | ICD-10-CM | POA: Diagnosis not present

## 2024-05-03 DIAGNOSIS — D649 Anemia, unspecified: Secondary | ICD-10-CM | POA: Diagnosis not present

## 2024-05-03 DIAGNOSIS — Z86011 Personal history of benign neoplasm of the brain: Secondary | ICD-10-CM | POA: Diagnosis not present

## 2024-05-03 DIAGNOSIS — I10 Essential (primary) hypertension: Secondary | ICD-10-CM | POA: Diagnosis not present

## 2024-05-03 DIAGNOSIS — G9608 Other cranial cerebrospinal fluid leak: Secondary | ICD-10-CM | POA: Diagnosis not present

## 2024-05-06 DIAGNOSIS — F411 Generalized anxiety disorder: Secondary | ICD-10-CM | POA: Diagnosis not present

## 2024-05-07 ENCOUNTER — Other Ambulatory Visit (HOSPITAL_COMMUNITY): Payer: Self-pay

## 2024-05-07 DIAGNOSIS — D649 Anemia, unspecified: Secondary | ICD-10-CM | POA: Diagnosis not present

## 2024-05-07 DIAGNOSIS — Z982 Presence of cerebrospinal fluid drainage device: Secondary | ICD-10-CM | POA: Diagnosis not present

## 2024-05-07 DIAGNOSIS — F32A Depression, unspecified: Secondary | ICD-10-CM | POA: Diagnosis not present

## 2024-05-07 DIAGNOSIS — Z48811 Encounter for surgical aftercare following surgery on the nervous system: Secondary | ICD-10-CM | POA: Diagnosis not present

## 2024-05-07 DIAGNOSIS — G9608 Other cranial cerebrospinal fluid leak: Secondary | ICD-10-CM | POA: Diagnosis not present

## 2024-05-07 DIAGNOSIS — F419 Anxiety disorder, unspecified: Secondary | ICD-10-CM | POA: Diagnosis not present

## 2024-05-07 DIAGNOSIS — Z86011 Personal history of benign neoplasm of the brain: Secondary | ICD-10-CM | POA: Diagnosis not present

## 2024-05-07 DIAGNOSIS — I10 Essential (primary) hypertension: Secondary | ICD-10-CM | POA: Diagnosis not present

## 2024-05-08 ENCOUNTER — Ambulatory Visit: Payer: Self-pay

## 2024-05-08 ENCOUNTER — Encounter: Payer: Self-pay | Admitting: Family Medicine

## 2024-05-08 NOTE — Telephone Encounter (Signed)
 FYI Only or Action Required?: FYI only for provider.  Called Nurse Triage reporting Dysuria.  Symptoms began several days ago.  Interventions attempted: Nothing.  Symptoms are: gradually worsening.  Triage Disposition: See Physician Within 24 Hours  Patient/caregiver understands and will follow disposition?: No appointment, going to urgent care        Copied from CRM #[id number]. Topic: Clinical - Red Word Triage >> May 08, 2024  1:33 PM Alexa [name] wrote: Red Word that prompted transfer to Nurse Triage: Possible UTI. Pain when urinating, frequency, and urgency. Going on for the last two days. Not getting worse but not going away. Seeing Waddell Mon as a NP on 11/17        Reason for Disposition  All other patients with painful urination  (Exception: [1] EITHER frequency or urgency AND [2] has on-call doctor.)  Answer Assessment - Initial Assessment Questions 1. SEVERITY: How bad is the pain?  (e.g., Scale 1-10; mild, moderate, or severe)     4/10 2. FREQUENCY: How many times have you had painful urination today?      5-6 times  3. PATTERN: Is pain present every time you urinate or just sometimes?      Every urination  4. ONSET: When did the painful urination start?      2-3 days ago  5. FEVER: Do you have a fever? If Yes, ask: What is your temperature, how was it measured, and when did it start?     No 6. PAST UTI: Have you had a urine infection before? If Yes, ask: When was the last time? and What happened that time?      Yes, similar  7. CAUSE: What do you think is causing the painful urination?  (e.g., UTI, scratch, Herpes sore)     Possible UTI 8. OTHER SYMPTOMS: Do you have any other symptoms? (e.g., blood in urine, flank pain, genital sores, urgency, vaginal discharge)     Increased frequency and urgency  Protocols used: Urination Pain - Female-A-AH

## 2024-05-09 ENCOUNTER — Ambulatory Visit
Admission: EM | Admit: 2024-05-09 | Discharge: 2024-05-09 | Disposition: A | Discharge status: Home / Self Care | Attending: Family Medicine | Admitting: Family Medicine

## 2024-05-09 ENCOUNTER — Other Ambulatory Visit (HOSPITAL_BASED_OUTPATIENT_CLINIC_OR_DEPARTMENT_OTHER): Payer: Self-pay

## 2024-05-09 DIAGNOSIS — R300 Dysuria: Secondary | ICD-10-CM | POA: Diagnosis not present

## 2024-05-09 DIAGNOSIS — N3001 Acute cystitis with hematuria: Secondary | ICD-10-CM

## 2024-05-09 LAB — POCT URINE DIPSTICK
Bilirubin, UA: NEGATIVE
Glucose, UA: NEGATIVE mg/dL
Ketones, POC UA: NEGATIVE mg/dL
Nitrite, UA: POSITIVE — AB
POC PROTEIN,UA: >=300 — AB
Spec Grav, UA: >=1.03 — AB (ref 1.010–1.025)
Urobilinogen, UA: 0.2 U/dL
pH, UA: 6 (ref 5.0–8.0)

## 2024-05-09 MED ORDER — CEPHALEXIN 500 MG PO CAPS
500.0000 mg | ORAL_CAPSULE | Freq: Two times a day (BID) | ORAL | 0 refills | Status: AC
  Filled 2024-05-09: qty 14, 7d supply, fill #0

## 2024-05-09 NOTE — ED Provider Notes (Signed)
 [facility]    CSN: [id number] Arrival date & time: 05/09/24  1513      History   Chief Complaint No chief complaint on file.   HPI Alexa [name] is a 53 y.o. female presents for dysuria.  Patient reports 3 days of urinary burning, urgency, frequency, and suprapubic pressure.  Denies fevers, nausea/vomiting, flank pain.  No vaginal discharge or STD concern.  Denies history of recurrent UTIs.  Took an at home test that send she had a UTI.  Has not taken any OTC medications for symptoms.  She does states she has not been drinking any water recently.  No other concerns at this time.  HPI  Past Medical History:  Diagnosis Date   Anemia    Anxiety    Depression    GERD (gastroesophageal reflux disease)    Headache    Hypertension    Meningioma (HCC)    PONV (postoperative nausea and vomiting)     Patient Active Problem List   Diagnosis Date Noted   Essential hypertension 04/20/2024   Hypokalemia 04/20/2024   Neutropenia 04/20/2024   ABLA (acute blood loss anemia) 04/20/2024   OAB (overactive bladder) 04/20/2024   Paresthesias 04/20/2024   Meningitis 04/09/2024   Meningitis spinal 04/02/2024   Sepsis (HCC) 04/02/2024   Postoperative CSF leak 03/24/2024   CSF leak 03/17/2024   Status post craniotomy 02/21/2024   Meningioma, recurrent of brain (HCC) 02/21/2024   S/P craniotomy 02/16/2024   Social anxiety disorder 12/25/2016   Severe episode of recurrent major depressive disorder, without psychotic features (HCC) 12/25/2016   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    Past Surgical History:  Procedure Laterality Date   APPLICATION OF CRANIAL NAVIGATION N/A 02/21/2024   Procedure: COMPUTER-ASSISTED NAVIGATION, FOR CRANIAL PROCEDURE;  Surgeon: Gillie Duncans, MD;  [facility];  Service: Neurosurgery;  Laterality: N/A;   CRANIOTOMY  05/26/2016   Procedure: SUBOCCIPITAL CRANIOTOMY WITH PLACEMENT OF VENTRICULAR CATHETER;  Surgeon: Duncans Gillie, MD;   [facility] OR;  Service: Neurosurgery;;  CRANIOTOMY - SUBOCCIPITAL   CRANIOTOMY Left 02/21/2024   Procedure: CRANIOTOMY TUMOR EXCISION;  Surgeon: Gillie Duncans, MD;  Location: [facility] OR;  Service: Neurosurgery;  Laterality: Left;  Occipital - left Craniotomy for tumor resection with Stealth   CRANIOTOMY Left 02/26/2024   Procedure: CRANIOTOMY TUMOR EXCISION;  Surgeon: Gillie Duncans, MD;  Location: [facility] OR;  Service: Neurosurgery;  Laterality: Left;  Craniotomy for tumor Stage II   LUMBAR WOUND DEBRIDEMENT N/A 03/18/2024   Procedure: WOUND DEBRIDEMENT;  Surgeon: Gillie Duncans, MD;  Location: [facility] OR;  Service: Neurosurgery;  Laterality: N/A;  Wound revision at cranial site   PLACEMENT OF LUMBAR DRAIN N/A 03/18/2024   Procedure: PLACEMENT OF LUMBAR DRAIN;  Surgeon: Gillie Duncans, MD;  [facility];  Service: Neurosurgery;  Laterality: N/A;  Placement of Lumbar Drain   VENTRICULOPERITONEAL SHUNT Right 03/24/2024   Procedure: SHUNT INSERTION VENTRICULAR-PERITONEAL With Laparoscopic verification of shunt placement into the peritoneum;  Surgeon: Gillie Duncans, MD;  Location: [facility] OR;  Service: Neurosurgery;  Laterality: Right;   WISDOM TOOTH EXTRACTION      OB History   No obstetric history on file.      Home Medications    Prior to Admission medications   Medication Sig Start Date End Date Taking? Authorizing Provider  cephALEXin  (KEFLEX ) 500 MG capsule Take 1 capsule (500 mg total) by mouth 2 (two) times daily for 7 days. 05/09/24 05/16/24 Yes Christoffer Currier, Jodi R, NP  acetaminophen  (TYLENOL ) 325  MG tablet Take 2 tablets (650 mg total) by mouth in the morning, at noon, and at bedtime. 04/14/24   Love, Sharlet RAMAN, PA-C  atenolol  (TENORMIN ) 50 MG tablet Take 1 tablet (50 mg total) by mouth daily. 05/21/23     buPROPion  (WELLBUTRIN  SR) 150 MG 12 hr tablet Take 1 tablet (150 mg total) by mouth daily. 04/25/24 04/25/25  Geralene Kaiser, MD  docusate sodium  (COLACE) 100 MG capsule Take 1 capsule (100 mg total) by mouth 2  (two) times daily. Patient not taking: Reported on 04/18/2024 04/15/24   Love, Sharlet RAMAN, PA-C  escitalopram  (LEXAPRO ) 20 MG tablet Take 2 tablets (40 mg total) by mouth daily. 04/25/24   Geralene Kaiser, MD  gabapentin  (NEURONTIN ) 400 MG capsule Take 1 capsule (400 mg total) by mouth 3 (three) times daily. 04/14/24   Love, Sharlet RAMAN, PA-C  hydrochlorothiazide  (HYDRODIURIL ) 12.5 MG tablet Take 1 tablet (12.5 mg total) by mouth daily. 05/21/23     methocarbamol  (ROBAXIN ) 500 MG tablet Take 1 tablet (500 mg total) by mouth 3 (three) times daily. 04/14/24   Love, Sharlet RAMAN, PA-C  mirabegron  ER (MYRBETRIQ ) 25 MG TB24 tablet Take 1 tablet (25 mg total) by mouth daily. 04/15/24   Love, Sharlet RAMAN, PA-C  polycarbophil (FIBERCON) 625 MG tablet Take 1 tablet (625 mg total) by mouth 2 (two) times daily. Patient not taking: Reported on 04/18/2024 04/15/24   Love, Sharlet RAMAN, PA-C  SODIUM FLUORIDE , DENTAL RINSE, (PREVIDENT ) 0.2 % SOLN USE AS AN ORAL RINSE, AS DIRECTED ON PACKAGE Patient not taking: Reported on 04/18/2024 12/13/23       Family History Family History  Problem Relation Age of Onset   Hypertension Mother    Heart disease Mother    Hypertension Father    Kidney disease Father    Ulcers Father    Diabetes Father        many paternal family members also with diabetes   Ovarian cancer Maternal Grandmother    Cirrhosis Paternal Grandfather        alcohol  related   Thyroid  cancer Maternal Aunt    Colon cancer Neg Hx    Esophageal cancer Neg Hx    Pancreatic cancer Neg Hx    Stomach cancer Neg Hx     Social History Social History   Tobacco Use   Smoking status: Never   Smokeless tobacco: Never  Vaping Use   Vaping status: Never Used  Substance Use Topics   Alcohol  use: Not Currently    Comment: socially, maybe a glass every few months   Drug use: No     Allergies   Watermelon [citrullus vulgaris]   Review of Systems Review of Systems  Genitourinary:  Positive for dysuria.      Physical Exam Triage Vital Signs ED Triage Vitals [05/09/24 1520]  Encounter Vitals Group     BP (!) 133/90     Girls Systolic BP Percentile      Girls Diastolic BP Percentile      Boys Systolic BP Percentile      Boys Diastolic BP Percentile      Pulse Rate (!) 102     Resp 18     Temp 98.2 F (36.8 C)     Temp src      SpO2 98 %     Weight      Height      Head Circumference      Peak Flow      Pain Score 0  Pain Loc      Pain Education      Exclude from Growth Chart    No data found.  Updated Vital Signs BP (!) 133/90   Pulse (!) 102   Temp 98.2 F (36.8 C)   Resp 18   LMP  (LMP Unknown)   SpO2 98%   Visual Acuity Right Eye Distance:   Left Eye Distance:   Bilateral Distance:    Right Eye Near:   Left Eye Near:    Bilateral Near:     Physical Exam Vitals and nursing note reviewed.  Constitutional:      Appearance: Normal appearance.  HENT:     Head: Normocephalic and atraumatic.  Eyes:     Pupils: Pupils are equal, round, and reactive to light.  Cardiovascular:     Rate and Rhythm: Tachycardia present.     Comments: Heart rate 102 Pulmonary:     Effort: Pulmonary effort is normal.  Abdominal:     Tenderness: There is no right CVA tenderness or left CVA tenderness.  Skin:    General: Skin is warm and dry.  Neurological:     General: No focal deficit present.     Mental Status: She is alert and oriented to person, place, and time.  Psychiatric:        Mood and Affect: Mood normal.        Behavior: Behavior normal.      UC Treatments / Results  Labs (all labs ordered are listed, but only abnormal results are displayed) Labs Reviewed  POCT URINE DIPSTICK - Abnormal; Notable for the following components:      Result Value   Clarity, UA cloudy (*)    Spec Grav, UA >=1.030 (*)    Blood, UA moderate (*)    POC PROTEIN,UA >=300 (*)    Nitrite, UA Positive (*)    Leukocytes, UA Trace (*)    All other components within normal  limits  URINE CULTURE   ntains abnormal data Basic metabolic panel Order: [id number]  Status: Final result     Next appt: 06/17/2024 at 09:20 AM in Family Medicine Basilio KATHEE Mon, NP)   Test Result Released: Yes (seen)   0 Result Notes          Component Ref Range & Units (hover) 3 wk ago (04/15/24) 4 wk ago (04/11/24) 1 mo ago (04/09/24) 1 mo ago (04/08/24) 1 mo ago (04/07/24) 1 mo ago (04/06/24) 1 mo ago (04/05/24)  Sodium 138 139 139 138 136 138 135  Potassium 3.7 3.6 3.3 Low  3.8 3.5 4.4 4.3  Chloride 104 104 107 106 102 102 101  CO2 26 26 24 24 24 24 26   Glucose, Bld 105 High  104 High  CM 124 High  CM 130 High  CM 102 High  CM 107 High  CM 103 High  CM  Comment: Glucose reference range applies only to samples taken after fasting for at least 8 hours.  BUN 8 5 Low  6 <5 Low  10 7 11   Creatinine, Ser 0.74 0.81 0.68 0.63 0.76 0.69 0.78  Calcium  9.2 9.4 9.2 9.1 8.8 Low  9.3 8.6 Low   GFR, Estimated >60 >60 CM >60 CM >60 CM >60 CM >60 CM >60 CM  Comment: (NOTE) Calculated using the CKD-EPI Creatinine Equation (2021)  Anion gap 8 9 CM 8 CM 8 CM 10 CM 12 CM 8 CM  Comment: Performed at [facility], [address] [place], [place] [address]  Resulting Agency [facility]        Specimen Collected: 04/15/24 05:34 Last Resulted: 04/15/24 06:15   EKG   Radiology No results found.  Procedures Procedures (including critical care time)  Medications Ordered in UC Medications - No data to display  Initial Impression / Assessment and Plan / UC Course  I have reviewed the triage vital signs and the nursing notes.  Pertinent labs & imaging results that were available during my care of the patient were reviewed by me and considered in my medical decision making (see chart for details).     Reviewed exam and symptoms with patient.  No red flags.  Urine positive for UTI, will culture and start Keflex  twice  daily for 7 days.  Advised to increase water intake and follow-up with PCP if symptoms do not improve.  ER precautions reviewed. Final Clinical Impressions(s) / UC Diagnoses   Final diagnoses:  Dysuria  Acute cystitis with hematuria     Discharge Instructions      The clinic will contact you with results of the urine culture done today if positive.  Start Keflex  twice daily for 7 days.  Increase your fluid intake.  Lots of rest.  Please follow-up with your PCP if your symptoms do not improve.  Please go to the ER if you develop any worsening symptoms.  Hope you feel better soon!     ED Prescriptions     Medication Sig Dispense Auth. Provider   cephALEXin  (KEFLEX ) 500 MG capsule Take 1 capsule (500 mg total) by mouth 2 (two) times daily for 7 days. 14 capsule Gabino Hagin, Jodi R, NP      PDMP not reviewed this encounter.   Loreda Myla SAUNDERS, NP 05/09/24 [phone]

## 2024-05-09 NOTE — Discharge Instructions (Addendum)
 The clinic will contact you with results of the urine culture done today if positive.  Start Keflex  twice daily for 7 days.  Increase your fluid intake.  Lots of rest.  Please follow-up with your PCP if your symptoms do not improve.  Please go to the ER if you develop any worsening symptoms.  Hope you feel better soon!

## 2024-05-09 NOTE — ED Triage Notes (Signed)
 Pt present with c/o stomach pain, discomfort when urinating x three days. States she has lower back pain and has taken at home remedies. Pt states she has not been drinking water.

## 2024-05-10 DIAGNOSIS — H40033 Anatomical narrow angle, bilateral: Secondary | ICD-10-CM | POA: Diagnosis not present

## 2024-05-11 LAB — URINE CULTURE: Culture: >=100000 — AB

## 2024-05-13 ENCOUNTER — Ambulatory Visit (HOSPITAL_COMMUNITY): Payer: Self-pay

## 2024-05-13 ENCOUNTER — Other Ambulatory Visit (HOSPITAL_BASED_OUTPATIENT_CLINIC_OR_DEPARTMENT_OTHER): Payer: Self-pay

## 2024-05-13 DIAGNOSIS — F411 Generalized anxiety disorder: Secondary | ICD-10-CM | POA: Diagnosis not present

## 2024-05-13 MED ORDER — ONDANSETRON 4 MG PO TBDP
ORAL_TABLET | ORAL | 0 refills | Status: DC
  Filled 2024-05-13: qty 20, 5d supply, fill #0

## 2024-05-16 DIAGNOSIS — G9608 Other cranial cerebrospinal fluid leak: Secondary | ICD-10-CM | POA: Diagnosis not present

## 2024-05-16 DIAGNOSIS — D649 Anemia, unspecified: Secondary | ICD-10-CM | POA: Diagnosis not present

## 2024-05-16 DIAGNOSIS — F419 Anxiety disorder, unspecified: Secondary | ICD-10-CM | POA: Diagnosis not present

## 2024-05-16 DIAGNOSIS — Z48811 Encounter for surgical aftercare following surgery on the nervous system: Secondary | ICD-10-CM | POA: Diagnosis not present

## 2024-05-16 DIAGNOSIS — F32A Depression, unspecified: Secondary | ICD-10-CM | POA: Diagnosis not present

## 2024-05-16 DIAGNOSIS — I10 Essential (primary) hypertension: Secondary | ICD-10-CM | POA: Diagnosis not present

## 2024-05-16 DIAGNOSIS — Z982 Presence of cerebrospinal fluid drainage device: Secondary | ICD-10-CM | POA: Diagnosis not present

## 2024-05-16 DIAGNOSIS — Z86011 Personal history of benign neoplasm of the brain: Secondary | ICD-10-CM | POA: Diagnosis not present

## 2024-05-20 DIAGNOSIS — Z982 Presence of cerebrospinal fluid drainage device: Secondary | ICD-10-CM | POA: Diagnosis not present

## 2024-05-20 DIAGNOSIS — F32A Depression, unspecified: Secondary | ICD-10-CM | POA: Diagnosis not present

## 2024-05-20 DIAGNOSIS — D649 Anemia, unspecified: Secondary | ICD-10-CM | POA: Diagnosis not present

## 2024-05-20 DIAGNOSIS — I10 Essential (primary) hypertension: Secondary | ICD-10-CM | POA: Diagnosis not present

## 2024-05-20 DIAGNOSIS — Z86011 Personal history of benign neoplasm of the brain: Secondary | ICD-10-CM | POA: Diagnosis not present

## 2024-05-20 DIAGNOSIS — F419 Anxiety disorder, unspecified: Secondary | ICD-10-CM | POA: Diagnosis not present

## 2024-05-20 DIAGNOSIS — G9608 Other cranial cerebrospinal fluid leak: Secondary | ICD-10-CM | POA: Diagnosis not present

## 2024-05-20 DIAGNOSIS — Z48811 Encounter for surgical aftercare following surgery on the nervous system: Secondary | ICD-10-CM | POA: Diagnosis not present

## 2024-05-27 DIAGNOSIS — F411 Generalized anxiety disorder: Secondary | ICD-10-CM | POA: Diagnosis not present

## 2024-05-29 ENCOUNTER — Other Ambulatory Visit: Payer: Self-pay | Admitting: Neurosurgery

## 2024-05-29 DIAGNOSIS — D329 Benign neoplasm of meninges, unspecified: Secondary | ICD-10-CM

## 2024-05-31 ENCOUNTER — Other Ambulatory Visit: Payer: Self-pay

## 2024-05-31 ENCOUNTER — Ambulatory Visit
Admission: EM | Admit: 2024-05-31 | Discharge: 2024-05-31 | Disposition: A | Discharge status: Other Acute Inpatient Hospital | Attending: Family Medicine | Admitting: Family Medicine

## 2024-05-31 ENCOUNTER — Emergency Department (HOSPITAL_BASED_OUTPATIENT_CLINIC_OR_DEPARTMENT_OTHER)

## 2024-05-31 ENCOUNTER — Other Ambulatory Visit (HOSPITAL_BASED_OUTPATIENT_CLINIC_OR_DEPARTMENT_OTHER): Payer: Self-pay

## 2024-05-31 ENCOUNTER — Encounter (HOSPITAL_BASED_OUTPATIENT_CLINIC_OR_DEPARTMENT_OTHER): Payer: Self-pay

## 2024-05-31 ENCOUNTER — Emergency Department (HOSPITAL_BASED_OUTPATIENT_CLINIC_OR_DEPARTMENT_OTHER)
Admission: EM | Admit: 2024-05-31 | Discharge: 2024-05-31 | Disposition: A | Discharge status: Home / Self Care | Attending: Emergency Medicine | Admitting: Emergency Medicine

## 2024-05-31 DIAGNOSIS — K5732 Diverticulitis of large intestine without perforation or abscess without bleeding: Secondary | ICD-10-CM | POA: Diagnosis not present

## 2024-05-31 DIAGNOSIS — R1031 Right lower quadrant pain: Secondary | ICD-10-CM | POA: Diagnosis not present

## 2024-05-31 DIAGNOSIS — N281 Cyst of kidney, acquired: Secondary | ICD-10-CM | POA: Diagnosis not present

## 2024-05-31 DIAGNOSIS — N3289 Other specified disorders of bladder: Secondary | ICD-10-CM | POA: Diagnosis not present

## 2024-05-31 DIAGNOSIS — K5792 Diverticulitis of intestine, part unspecified, without perforation or abscess without bleeding: Secondary | ICD-10-CM

## 2024-05-31 DIAGNOSIS — K37 Unspecified appendicitis: Secondary | ICD-10-CM | POA: Diagnosis not present

## 2024-05-31 DIAGNOSIS — R42 Dizziness and giddiness: Secondary | ICD-10-CM | POA: Diagnosis not present

## 2024-05-31 DIAGNOSIS — R112 Nausea with vomiting, unspecified: Secondary | ICD-10-CM | POA: Diagnosis not present

## 2024-05-31 LAB — COMPREHENSIVE METABOLIC PANEL WITH GFR
ALT: 5 U/L (ref 0–44)
AST: 11 U/L — ABNORMAL LOW (ref 15–41)
Albumin: 3.9 g/dL (ref 3.5–5.0)
Alkaline Phosphatase: 96 U/L (ref 38–126)
Anion gap: 10 (ref 5–15)
BUN: 8 mg/dL (ref 6–20)
CO2: 27 mmol/L (ref 22–32)
Calcium: 10 mg/dL (ref 8.9–10.3)
Chloride: 104 mmol/L (ref 98–111)
Creatinine, Ser: 0.76 mg/dL (ref 0.44–1.00)
GFR, Estimated: >60 mL/min (ref >60)
Glucose, Bld: 100 mg/dL — ABNORMAL HIGH (ref 70–99)
Potassium: 3.9 mmol/L (ref 3.5–5.1)
Sodium: 141 mmol/L (ref 135–145)
Total Bilirubin: 0.3 mg/dL (ref 0.0–1.2)
Total Protein: 7.3 g/dL (ref 6.5–8.1)

## 2024-05-31 LAB — URINALYSIS, ROUTINE W REFLEX MICROSCOPIC
Bilirubin Urine: NEGATIVE
Glucose, UA: NEGATIVE mg/dL
Hgb urine dipstick: NEGATIVE
Ketones, ur: NEGATIVE mg/dL
Leukocytes,Ua: NEGATIVE
Nitrite: NEGATIVE
Protein, ur: NEGATIVE mg/dL
Specific Gravity, Urine: >=1.03 (ref 1.005–1.030)
pH: 5.5 (ref 5.0–8.0)

## 2024-05-31 LAB — CBC WITH DIFFERENTIAL/PLATELET
Abs Immature Granulocytes: 0.01 K/uL (ref 0.00–0.07)
Basophils Absolute: 0 K/uL (ref 0.0–0.1)
Basophils Relative: 0 %
Eosinophils Absolute: 0.1 K/uL (ref 0.0–0.5)
Eosinophils Relative: 2 %
HCT: 32.6 % — ABNORMAL LOW (ref 36.0–46.0)
Hemoglobin: 10.6 g/dL — ABNORMAL LOW (ref 12.0–15.0)
Immature Granulocytes: 0 %
Lymphocytes Relative: 25 %
Lymphs Abs: 1.3 K/uL (ref 0.7–4.0)
MCH: 26.5 pg (ref 26.0–34.0)
MCHC: 32.5 g/dL (ref 30.0–36.0)
MCV: 81.5 fL (ref 80.0–100.0)
Monocytes Absolute: 0.6 K/uL (ref 0.1–1.0)
Monocytes Relative: 11 %
Neutro Abs: 3.1 K/uL (ref 1.7–7.7)
Neutrophils Relative %: 62 %
Platelets: 415 K/uL — ABNORMAL HIGH (ref 150–400)
RBC: 4 MIL/uL (ref 3.87–5.11)
RDW: 13 % (ref 11.5–15.5)
WBC: 5.1 K/uL (ref 4.0–10.5)
nRBC: 0 % (ref 0.0–0.2)

## 2024-05-31 LAB — PREGNANCY, URINE: Preg Test, Ur: NEGATIVE

## 2024-05-31 LAB — LIPASE, BLOOD: Lipase: 12 U/L (ref 11–51)

## 2024-05-31 MED ORDER — IOHEXOL 300 MG/ML  SOLN
100.0000 mL | Freq: Once | INTRAMUSCULAR | Status: AC | PRN
  Administered 2024-05-31: 100 mL via INTRAVENOUS

## 2024-05-31 MED ORDER — PROMETHAZINE HCL 25 MG RE SUPP
25.0000 mg | Freq: Four times a day (QID) | RECTAL | 0 refills | Status: AC | PRN
  Filled 2024-05-31: qty 12, 3d supply, fill #0

## 2024-05-31 MED ORDER — AMOXICILLIN-POT CLAVULANATE 875-125 MG PO TABS
1.0000 | ORAL_TABLET | Freq: Two times a day (BID) | ORAL | 0 refills | Status: DC
  Filled 2024-05-31: qty 14, 7d supply, fill #0

## 2024-05-31 NOTE — Discharge Instructions (Addendum)
 Please go to the emergency room for further evaluation of your right lower abdominal pain

## 2024-05-31 NOTE — Discharge Instructions (Addendum)
 Please read and follow all provided instructions.  Your diagnoses today include:  1. Diverticulitis   2. Dizziness     Tests performed today include: Complete blood cell count: No concerns Complete metabolic panel: No concerns Lipase (pancreas function test): Was negative Urinalysis (urine test): No signs of infection CT scan of your abdomen pelvis shows possible mild descending/sigmoid diverticulitis, no obvious issues with VP shunt tip Vital signs. See below for your results today.   Medications prescribed:  Augmentin - antibiotic  You have been prescribed an antibiotic medicine: take the entire course of medicine even if you are feeling better. Stopping early can cause the antibiotic not to work.  Phenergan  (promethazine ) - for nausea and vomiting  Take any prescribed medications only as directed.  Home care instructions:  Follow any educational materials contained in this packet.  Follow-up instructions: Please follow-up with your primary care provider in the next 3 days for further evaluation of your symptoms.    Return instructions:  SEEK IMMEDIATE MEDICAL ATTENTION IF: The pain does not go away or becomes severe  A temperature above 101F develops  Repeated vomiting occurs (multiple episodes)  The pain becomes localized to portions of the abdomen. The right side could possibly be appendicitis. In an adult, the left lower portion of the abdomen could be colitis or diverticulitis.  Blood is being passed in stools or vomit (bright red or black tarry stools)  You develop chest pain, difficulty breathing, dizziness or fainting, or become confused, poorly responsive, or inconsolable (young children) If you have any other emergent concerns regarding your health  Please also consider returning if you have acute worsening in any of your neuro symptoms that are currently being worked up by your neurosurgery provider.  This would include severe worsening headache, confusion,  vomiting, eyesight changes.  Additional Information: Abdominal (belly) pain can be caused by many things. Your caregiver performed an examination and possibly ordered blood/urine tests and imaging (CT scan, x-rays, ultrasound). Many cases can be observed and treated at home after initial evaluation in the emergency department. Even though you are being discharged home, abdominal pain can be unpredictable. Therefore, you need a repeated exam if your pain does not resolve, returns, or worsens. Most patients with abdominal pain don't have to be admitted to the hospital or have surgery, but serious problems like appendicitis and gallbladder attacks can start out as nonspecific pain. Many abdominal conditions cannot be diagnosed in one visit, so follow-up evaluations are very important.  Your vital signs today were: BP (!) 147/96   Pulse 95   Temp 98.2 F (36.8 C)   Resp 16   Wt 93 kg   LMP  (LMP Unknown)   SpO2 100%   BMI 31.17 kg/m  If your blood pressure (bp) was elevated above 135/85 this visit, please have this repeated by your doctor within one month. --------------

## 2024-05-31 NOTE — ED Provider Notes (Signed)
 [facility] Provider Note   CSN: [id number] Arrival date & time: 05/31/24  9088     Patient presents with: Abdominal Pain   Alexa [name] is a 53 y.o. female.   Patient with no past abdominal surgical history presents to the emergency department today for evaluation of right and lower abdominal pain.  Patient states that symptoms have been present for about 2 to 3 days.  Symptoms are intermittent.  She describes a dull pain that comes and goes, however pain is reproducible with palpation.  Patient denies change in recent vomiting symptoms.  She denies diarrhea or constipation.  No dysuria, increased frequency or urgency, hematuria.  Patient was seen in urgent care and referred to the emergency department for further evaluation, rule out of appendicitis.  No fevers.  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  Patient has had a shunt placed.  She states that she is currently working with her neurosurgeon to figure out if the shunt is working properly.  This is because she has had intermittent nausea and vomiting, intermittent headaches, intermittent dizziness that is described as vertigo.  Currently the symptoms are not present.  She has an MRI ordered and is currently awaiting this to be scheduled.  Patient feels that the symptoms, which have been going on since about September 15, are stable.  She denies vision changes.  Patient denies signs of stroke including: facial droop, slurred speech, aphasia, weakness/numbness in extremities, imbalance/trouble walking.  Reports currently walking with a walker or cane due to the dizzy spells.        Prior to Admission medications   Medication Sig Start Date End Date Taking? Authorizing Provider  acetaminophen  (TYLENOL ) 325 MG tablet Take 2 tablets (650 mg total) by mouth in the morning, at noon, and at bedtime. 04/14/24   Love, Sharlet RAMAN, PA-C   atenolol  (TENORMIN ) 50 MG tablet Take 1 tablet (50 mg total) by mouth daily. 05/21/23     buPROPion  (WELLBUTRIN  SR) 150 MG 12 hr tablet Take 1 tablet (150 mg total) by mouth daily. 04/25/24 04/25/25  Geralene Kaiser, MD  docusate sodium  (COLACE) 100 MG capsule Take 1 capsule (100 mg total) by mouth 2 (two) times daily. 04/15/24   Love, Sharlet RAMAN, PA-C  escitalopram  (LEXAPRO ) 20 MG tablet Take 2 tablets (40 mg total) by mouth daily. 04/25/24   Geralene Kaiser, MD  gabapentin  (NEURONTIN ) 400 MG capsule Take 1 capsule (400 mg total) by mouth 3 (three) times daily. 04/14/24   Love, Sharlet RAMAN, PA-C  hydrochlorothiazide  (HYDRODIURIL ) 12.5 MG tablet Take 1 tablet (12.5 mg total) by mouth daily. 05/21/23     methocarbamol  (ROBAXIN ) 500 MG tablet Take 1 tablet (500 mg total) by mouth 3 (three) times daily. 04/14/24   Love, Sharlet RAMAN, PA-C  mirabegron  ER (MYRBETRIQ ) 25 MG TB24 tablet Take 1 tablet (25 mg total) by mouth daily. 04/15/24   Love, Sharlet RAMAN, PA-C  ondansetron  (ZOFRAN -ODT) 4 MG disintegrating tablet Take 1 tablet by translingual route every 6 hours as needed for nausea. Place on top of the tongue where it will dissolve, then swallow. 05/13/24     polycarbophil (FIBERCON) 625 MG tablet Take 1 tablet (625 mg total) by mouth 2 (two) times daily. 04/15/24   Love, Sharlet RAMAN, PA-C  SODIUM FLUORIDE , DENTAL RINSE, (PREVIDENT ) 0.2 % SOLN USE AS AN ORAL RINSE, AS DIRECTED ON PACKAGE 12/13/23  Allergies: Watermelon [citrullus vulgaris]    Review of Systems  Updated Vital Signs BP (!) 147/96   Pulse 95   Temp 98.2 F (36.8 C)   Resp 16   Wt 93 kg   LMP  (LMP Unknown)   SpO2 100%   BMI 31.17 kg/m   Physical Exam Vitals and nursing note reviewed.  Constitutional:      General: She is not in acute distress.    Appearance: She is well-developed.  HENT:     Head: Normocephalic and atraumatic.     Right Ear: External ear normal.     Left Ear: External ear normal.     Nose: Nose normal.  Eyes:      Conjunctiva/sclera: Conjunctivae normal.     Comments: No vertigo noted.  Cardiovascular:     Rate and Rhythm: Normal rate and regular rhythm.     Heart sounds: No murmur heard. Pulmonary:     Effort: No respiratory distress.     Breath sounds: No wheezing, rhonchi or rales.  Abdominal:     Palpations: Abdomen is soft.     Tenderness: There is abdominal tenderness in the right lower quadrant, periumbilical area and suprapubic area. There is no guarding or rebound. Negative signs include Murphy's sign and McBurney's sign.  Musculoskeletal:     Cervical back: Normal range of motion and neck supple.     Right lower leg: No edema.     Left lower leg: No edema.  Skin:    General: Skin is warm and dry.     Findings: No rash.  Neurological:     General: No focal deficit present.     Mental Status: She is alert. Mental status is at baseline.     Motor: No weakness.  Psychiatric:        Mood and Affect: Mood normal.     (all labs ordered are listed, but only abnormal results are displayed) Labs Reviewed  CBC WITH DIFFERENTIAL/PLATELET - Abnormal; Notable for the following components:      Result Value   Hemoglobin 10.6 (*)    HCT 32.6 (*)    Platelets 415 (*)    All other components within normal limits  COMPREHENSIVE METABOLIC PANEL WITH GFR - Abnormal; Notable for the following components:   Glucose, Bld 100 (*)    AST 11 (*)    All other components within normal limits  URINALYSIS, ROUTINE W REFLEX MICROSCOPIC - Abnormal; Notable for the following components:   APPearance HAZY (*)    All other components within normal limits  LIPASE, BLOOD  PREGNANCY, URINE    EKG: None  Radiology: CT ABDOMEN PELVIS W CONTRAST Result Date: 05/31/2024 EXAM: CT ABDOMEN AND PELVIS WITH CONTRAST 05/31/2024 11:28:00 AM TECHNIQUE: CT of the abdomen and pelvis was performed with the administration of 100 mL of iohexol  (OMNIPAQUE ) 300 MG/ML solution. Multiplanar reformatted images are provided  for review. Automated exposure control, iterative reconstruction, and/or weight-based adjustment of the mA/kV was utilized to reduce the radiation dose to as low as reasonably achievable. COMPARISON: 09/01/225 CLINICAL HISTORY: RLQ abdominal pain; eval appendicitis FINDINGS: LOWER CHEST: No acute abnormality. LIVER: The liver is unremarkable. GALLBLADDER AND BILE DUCTS: Gallbladder is unremarkable. No biliary ductal dilatation. SPLEEN: No acute abnormality. PANCREAS: No acute abnormality. ADRENAL GLANDS: No acute abnormality. KIDNEYS, URETERS AND BLADDER: Left renal cyst is noted. No stones in the kidneys or ureters. No hydronephrosis. No perinephric or periureteral stranding. Urinary bladder is unremarkable. GI AND BOWEL: Stomach demonstrates  no acute abnormality. Possible mild diverticulitis of distal descending and sigmoid colon. There is no bowel obstruction. PERITONEUM AND RETROPERITONEUM: Ventricular peritoneal shunt is seen entering anterior abdominal wall with distal tip anteriorly in the pelvis. Small amount of free fluid is noted in the pelvis which may be physiologic. No free air. VASCULATURE: Aorta is normal in caliber. LYMPH NODES: No lymphadenopathy. REPRODUCTIVE ORGANS: No acute abnormality. BONES AND SOFT TISSUES: No acute osseous abnormality. No focal soft tissue abnormality. IMPRESSION: 1. Possible mild diverticulitis of the distal descending and sigmoid colon. 2. Small amount of pelvic free fluid, possibly physiologic. Electronically signed by: Lynwood Seip MD 05/31/2024 12:13 PM EDT RP Workstation: [id number]     Procedures   Medications Ordered in the ED  iohexol  (OMNIPAQUE ) 300 MG/ML solution 100 mL (100 mLs Intravenous Contrast Given 05/31/24 1119)    ED Course  Patient seen and examined. History obtained directly from patient.   Labs/EKG: Ordered CBC, CMP, lipase, UA, pregnancy.  Imaging: Ordered CT abdomen pelvis to evaluate for right lower quadrant  pain.  Medications/Fluids: Offered and patient declines medication for pain and nausea  Most recent vital signs reviewed and are as follows: BP (!) 147/96   Pulse 95   Temp 98.2 F (36.8 C)   Resp 16   Wt 93 kg   LMP  (LMP Unknown)   SpO2 100%   BMI 31.17 kg/m   Initial impression: Right lower quadrant abdominal pain  At this time, dizziness and neurosymptoms appear to be stable.  Patient is having these worked up as outpatient.  She presented today for her abdominal pain.  She has a reassuring gross neuro exam.  Will focus on abdominal pain symptoms at this time less something acutely worsens.  12:27 PM Reassessment performed. Patient appears stable.  Labs personally reviewed and interpreted including: CBC with normal white blood cell count, hemoglobin at and 10.6, improved from recent baseline of 8-9; CMP unremarkable; lipase normal; UA without signs of infection; pregnancy negative.  Imaging personally visualized and interpreted including: CT of the abdomen pelvis, no obvious VP shunt abnormality, possible mild descending/sigmoid diverticulitis.  Reviewed pertinent lab work and imaging with patient at bedside. Questions answered.   Most current vital signs reviewed and are as follows: BP (!) 147/96   Pulse 95   Temp 98.2 F (36.8 C)   Resp 16   Wt 93 kg   LMP  (LMP Unknown)   SpO2 100%   BMI 31.17 kg/m   Plan: Discharge to home.   Prescriptions written for: Augmentin x 7 days, Phenergan   Other home care instructions discussed: Bland diet, monitoring of symptoms  ED return instructions discussed: The patient was urged to return to the Emergency Department immediately with worsening of current symptoms, worsening abdominal pain, persistent vomiting, blood noted in stools, fever, or any other concerns. The patient verbalized understanding.   Follow-up instructions discussed: Patient encouraged to follow-up with their PCP in 3 days.                                    Medical Decision Making Amount and/or Complexity of Data Reviewed Labs: ordered. Radiology: ordered.  Risk Prescription drug management.   For this patient's complaint of abdominal pain, the following conditions were considered on the differential diagnosis: gastritis/PUD, enteritis/duodenitis, appendicitis, cholelithiasis/cholecystitis, cholangitis, pancreatitis, ruptured viscus, colitis, diverticulitis, small/large bowel obstruction, proctitis, cystitis, pyelonephritis, ureteral colic, aortic dissection, aortic aneurysm. In women,  ectopic pregnancy, pelvic inflammatory disease, ovarian cysts, and tubo-ovarian abscess were also considered. Atypical chest etiologies were also considered including ACS, PE, and pneumonia.  Labs were very reassuring, CT scan showed mild diverticulitis, no obvious VP shunt issues in the abdomen.  Patient will be treated for diverticulitis given no other concerning findings or explanations.  In regards to the dizziness, this has been ongoing for about 6 weeks.  Patient is under care of neurosurgery and has an MRI being scheduled.  Her symptoms are not worse than baseline today.  No vision changes, severe headache, vomiting, altered mentation.  Do not feel that she requires additional imaging of the head here today.  She seems reliable to return with worsening of follow-up with previously planned evaluation.  The patient's vital signs, pertinent lab work and imaging were reviewed and interpreted as discussed in the ED course. Hospitalization was considered for further testing, treatments, or serial exams/observation. However as patient is well-appearing, has a stable exam, and reassuring studies today, I do not feel that they warrant admission at this time. This plan was discussed with the patient who verbalizes agreement and comfort with this plan and seems reliable and able to return to the Emergency Department with worsening or changing symptoms.         Final  diagnoses:  Diverticulitis  Dizziness    ED Discharge Orders          Ordered    amoxicillin -clavulanate (AUGMENTIN) 875-125 MG tablet  Every 12 hours        05/31/24 1226    promethazine  (PHENERGAN ) 25 MG suppository  Every 6 hours PRN        05/31/24 1226               Prue Lingenfelter, PA-C 05/31/24 1230    Tonia Chew, MD 05/31/24 1456

## 2024-05-31 NOTE — ED Notes (Signed)
 Patient is being discharged from the Urgent Care and sent to the Emergency Department via POV . Per myla Bold, NP, patient is in need of higher level of care due to needing further testing and work up. Patient is aware and verbalizes understanding of plan of care.  Vitals:   05/31/24 0824  BP: (!) 132/92  Pulse: (!) 102  Resp: 17  Temp: 98.1 F (36.7 C)  SpO2: 97%

## 2024-05-31 NOTE — ED Triage Notes (Signed)
 Pt sent from UC. Complains of right lower abdominal pain. Pain intermittent and painful with palpation. Intermittent dizziness. Nauseated

## 2024-05-31 NOTE — ED Triage Notes (Signed)
 Pt states that she also has some nausea and vomiting.

## 2024-05-31 NOTE — ED Triage Notes (Signed)
 Pt states that she has some abdominal pain. X3 days Pt states that she also has some dizziness. X2 weeks  Pt states that she recently had a tumor removed from her brain in July. Pt states that she had another surgery in August was well.

## 2024-05-31 NOTE — ED Provider Notes (Signed)
 [facility]    CSN: [id number] Arrival date & time: 05/31/24  0815      History   Chief Complaint Chief Complaint  Patient presents with   Dizziness    HPI Alexa [name] is a 53 y.o. female with a complex past medical history including meningioma status post surgery with shunt placement in July complicated by meningitis in August, anxiety, depression, GERD presents for abdominal pain.  Patient reports 3 days of a worsening right lower quadrant abdominal pain.  She states she has been having nausea and vomiting but she is also had this ongoing for about 2 weeks with worsening dizziness.  Also having intermittent headaches since her surgery in July.  She states she saw her neurologist a couple days ago regarding the dizziness and nausea vomiting and is waiting to schedule an MRI.  She denies any fevers, neck pain, dysuria, diarrhea, URI symptoms.  Denies history of diverticulitis, IBS, Crohn's.  No abdominal surgeries.  She states she is trying to stay hydrated by drinking Pedialyte and also states she has been eating.   Dizziness Associated symptoms: nausea and vomiting     Past Medical History:  Diagnosis Date   Anemia    Anxiety    Depression    GERD (gastroesophageal reflux disease)    Headache    Hypertension    Meningioma (HCC)    PONV (postoperative nausea and vomiting)     Patient Active Problem List   Diagnosis Date Noted   Essential hypertension 04/20/2024   Hypokalemia 04/20/2024   Neutropenia 04/20/2024   ABLA (acute blood loss anemia) 04/20/2024   OAB (overactive bladder) 04/20/2024   Paresthesias 04/20/2024   Meningitis 04/09/2024   Meningitis spinal 04/02/2024   Sepsis (HCC) 04/02/2024   Postoperative CSF leak 03/24/2024   CSF leak 03/17/2024   Status post craniotomy 02/21/2024   Meningioma, recurrent of brain (HCC) 02/21/2024   S/P craniotomy 02/16/2024   Social anxiety disorder 12/25/2016   Severe episode of recurrent major depressive  disorder, without psychotic features (HCC) 12/25/2016   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    Past Surgical History:  Procedure Laterality Date   APPLICATION OF CRANIAL NAVIGATION N/A 02/21/2024   Procedure: COMPUTER-ASSISTED NAVIGATION, FOR CRANIAL PROCEDURE;  Surgeon: Gillie Duncans, MD;  Location: [facility] OR;  Service: Neurosurgery;  Laterality: N/A;   CRANIOTOMY  05/26/2016   Procedure: SUBOCCIPITAL CRANIOTOMY WITH PLACEMENT OF VENTRICULAR CATHETER;  Surgeon: Duncans Gillie, MD;  Location: [facility] OR;  Service: Neurosurgery;;  CRANIOTOMY - SUBOCCIPITAL   CRANIOTOMY Left 02/21/2024   Procedure: CRANIOTOMY TUMOR EXCISION;  Surgeon: Gillie Duncans, MD;  Location: [facility] OR;  Service: Neurosurgery;  Laterality: Left;  Occipital - left Craniotomy for tumor resection with Stealth   CRANIOTOMY Left 02/26/2024   Procedure: CRANIOTOMY TUMOR EXCISION;  Surgeon: Gillie Duncans, MD;  Location: [facility] OR;  Service: Neurosurgery;  Laterality: Left;  Craniotomy for tumor Stage II   LUMBAR WOUND DEBRIDEMENT N/A 03/18/2024   Procedure: WOUND DEBRIDEMENT;  Surgeon: Gillie Duncans, MD;  Location: [facility] OR;  Service: Neurosurgery;  Laterality: N/A;  Wound revision at cranial site   PLACEMENT OF LUMBAR DRAIN N/A 03/18/2024   Procedure: PLACEMENT OF LUMBAR DRAIN;  Surgeon: Gillie Duncans, MD;  Location: [facility] OR;  Service: Neurosurgery;  Laterality: N/A;  Placement of Lumbar Drain   VENTRICULOPERITONEAL SHUNT Right 03/24/2024   Procedure: SHUNT INSERTION VENTRICULAR-PERITONEAL With Laparoscopic verification of shunt placement into the peritoneum;  Surgeon: Gillie Duncans, MD;  Location[facility];  Service: Neurosurgery;  Laterality: Right;   WISDOM TOOTH EXTRACTION      OB History   No obstetric history on file.      Home Medications    Prior to Admission medications   Medication Sig Start Date End Date Taking? Authorizing Provider  acetaminophen  (TYLENOL ) 325 MG tablet Take 2 tablets (650 mg total) by mouth in the morning, at noon,  and at bedtime. 04/14/24  Yes Love, Sharlet RAMAN, PA-C  atenolol  (TENORMIN ) 50 MG tablet Take 1 tablet (50 mg total) by mouth daily. 05/21/23  Yes   buPROPion  (WELLBUTRIN  SR) 150 MG 12 hr tablet Take 1 tablet (150 mg total) by mouth daily. 04/25/24 04/25/25 Yes Geralene Kaiser, MD  docusate sodium  (COLACE) 100 MG capsule Take 1 capsule (100 mg total) by mouth 2 (two) times daily. 04/15/24  Yes Love, Sharlet RAMAN, PA-C  escitalopram  (LEXAPRO ) 20 MG tablet Take 2 tablets (40 mg total) by mouth daily. 04/25/24  Yes Geralene Kaiser, MD  gabapentin  (NEURONTIN ) 400 MG capsule Take 1 capsule (400 mg total) by mouth 3 (three) times daily. 04/14/24  Yes Love, Sharlet RAMAN, PA-C  hydrochlorothiazide  (HYDRODIURIL ) 12.5 MG tablet Take 1 tablet (12.5 mg total) by mouth daily. 05/21/23  Yes   methocarbamol  (ROBAXIN ) 500 MG tablet Take 1 tablet (500 mg total) by mouth 3 (three) times daily. 04/14/24  Yes Love, Sharlet RAMAN, PA-C  mirabegron  ER (MYRBETRIQ ) 25 MG TB24 tablet Take 1 tablet (25 mg total) by mouth daily. 04/15/24  Yes Love, Sharlet RAMAN, PA-C  ondansetron  (ZOFRAN -ODT) 4 MG disintegrating tablet Take 1 tablet by translingual route every 6 hours as needed for nausea. Place on top of the tongue where it will dissolve, then swallow. 05/13/24  Yes   polycarbophil (FIBERCON) 625 MG tablet Take 1 tablet (625 mg total) by mouth 2 (two) times daily. 04/15/24  Yes Love, Sharlet RAMAN, PA-C  SODIUM FLUORIDE , DENTAL RINSE, (PREVIDENT ) 0.2 % SOLN USE AS AN ORAL RINSE, AS DIRECTED ON PACKAGE 12/13/23  Yes     Family History Family History  Problem Relation Age of Onset   Hypertension Mother    Heart disease Mother    Hypertension Father    Kidney disease Father    Ulcers Father    Diabetes Father        many paternal family members also with diabetes   Ovarian cancer Maternal Grandmother    Cirrhosis Paternal Grandfather        alcohol  related   Thyroid  cancer Maternal Aunt    Colon cancer Neg Hx    Esophageal cancer Neg Hx    Pancreatic  cancer Neg Hx    Stomach cancer Neg Hx     Social History Social History   Tobacco Use   Smoking status: Never   Smokeless tobacco: Never  Vaping Use   Vaping status: Never Used  Substance Use Topics   Alcohol  use: Yes    Comment: socially, maybe a glass every few months   Drug use: No     Allergies   Watermelon [citrullus vulgaris]   Review of Systems Review of Systems  Gastrointestinal:  Positive for abdominal pain, nausea and vomiting.  Neurological:  Positive for dizziness.     Physical Exam Triage Vital Signs ED Triage Vitals  Encounter Vitals Group     BP 05/31/24 0824 (!) 132/92     Girls Systolic BP Percentile --      Girls Diastolic BP Percentile --      Boys Systolic BP Percentile --  Boys Diastolic BP Percentile --      Pulse Rate 05/31/24 0824 (!) 102     Resp 05/31/24 0824 17     Temp 05/31/24 0824 98.1 F (36.7 C)     Temp Source 05/31/24 0824 Oral     SpO2 05/31/24 0824 97 %     Weight 05/31/24 0823 204 lb (92.5 kg)     Height 05/31/24 0823 5' 8 (1.727 m)     Head Circumference --      Peak Flow --      Pain Score 05/31/24 0822 4     Pain Loc --      Pain Education --      Exclude from Growth Chart --    No data found.  Updated Vital Signs BP (!) 132/92 (BP Location: Right Arm)   Pulse (!) 102   Temp 98.1 F (36.7 C) (Oral)   Resp 17   Ht 5' 8 (1.727 m)   Wt 204 lb (92.5 kg)   LMP  (LMP Unknown)   SpO2 97%   BMI 31.02 kg/m   Visual Acuity Right Eye Distance:   Left Eye Distance:   Bilateral Distance:    Right Eye Near:   Left Eye Near:    Bilateral Near:     Physical Exam Vitals and nursing note reviewed.  Constitutional:      General: She is not in acute distress.    Appearance: Normal appearance. She is not ill-appearing.  HENT:     Head: Normocephalic and atraumatic.  Eyes:     Pupils: Pupils are equal, round, and reactive to light.  Cardiovascular:     Rate and Rhythm: Tachycardia present.     Comments:  Heart rate 102 Pulmonary:     Effort: Pulmonary effort is normal.  Abdominal:     General: Bowel sounds are normal.     Palpations: Abdomen is soft. There is no hepatomegaly or splenomegaly.     Tenderness: There is abdominal tenderness in the right lower quadrant and left lower quadrant. There is no guarding or rebound. Positive signs include McBurney's sign. Negative signs include Rovsing's sign.  Skin:    General: Skin is warm and dry.  Neurological:     General: No focal deficit present.     Mental Status: She is alert and oriented to person, place, and time.  Psychiatric:        Mood and Affect: Mood normal.        Behavior: Behavior normal.      UC Treatments / Results  Labs (all labs ordered are listed, but only abnormal results are displayed) Labs Reviewed - No data to display  EKG   Radiology No results found.  Procedures Procedures (including critical care time)  Medications Ordered in UC Medications - No data to display  Initial Impression / Assessment and Plan / UC Course  I have reviewed the triage vital signs and the nursing notes.  Pertinent labs & imaging results that were available during my care of the patient were reviewed by me and considered in my medical decision making (see chart for details).     I reviewed exam and symptoms with patient.  Discussed limitations and abilities of urgent care.  Patient with significant right lower quadrant abdominal pain x 3 days as well as intermittent dizziness x 2 weeks with nausea and vomiting, already had follow-up with neurologist and awaiting imaging.  Given her worsening right lower quadrant abdominal pain and exam  advised to go to the ER to rule out appendicitis.  She is in agreement with plan will go POV to the emergency room. Final Clinical Impressions(s) / UC Diagnoses   Final diagnoses:  Right lower quadrant abdominal pain  Nausea and vomiting, unspecified vomiting type  Dizziness     Discharge  Instructions      Please go to the emergency room for further evaluation of your right lower abdominal pain    ED Prescriptions   None    PDMP not reviewed this encounter.   Loreda Myla SAUNDERS, NP 05/31/24 [phone]

## 2024-05-31 NOTE — ED Provider Notes (Signed)
.  Ultrasound ED Peripheral IV (Provider)  Date/Time: 05/31/2024 10:21 AM  Performed by: Tonia Chew, MD Authorized by: Tonia Chew, MD   Procedure details:    Indications: multiple failed IV attempts     Skin Prep: isopropyl alcohol      Location:  Right AC   Angiocath:  20 G   Bedside Ultrasound Guided: Yes     Images: archived     Patient tolerated procedure without complications: Yes       Tonia Chew, MD 06/03/24 [phone]

## 2024-06-12 DIAGNOSIS — F411 Generalized anxiety disorder: Secondary | ICD-10-CM | POA: Diagnosis not present

## 2024-06-16 NOTE — Progress Notes (Signed)
 New Patient Office Visit   Subjective     Patient ID: Alexa [name], female   DOB: September 18, 1970  Age: 53 y.o. MRN: [id number]   CC:  No chief complaint on file.     HPI MARJORIA MANCILLAS presents to establish care      Hypertension: - Medications: Atenolol  50 mg daily and HCTZ 12.5 mg daily. - Compliance: *** - Checking BP at home: *** - Denies any SOB, recurrent headaches, CP, vision changes, LE edema, dizziness, palpitations, or medication side effects. - Diet: *** - Exercise: *** - Stressors:   Anxiety and Depression: - Treatment: Wellbutrin  150 mg daily and Lexapro  40 mg daily.  - Medication side effects:  - SI/HI:  - Update:  Outpatient Medications Prior to Visit  Medication Sig   acetaminophen  (TYLENOL ) 325 MG tablet Take 2 tablets (650 mg total) by mouth in the morning, at noon, and at bedtime.   amoxicillin -clavulanate (AUGMENTIN) 875-125 MG tablet Take 1 tablet by mouth every 12 (twelve) hours.   atenolol  (TENORMIN ) 50 MG tablet Take 1 tablet (50 mg total) by mouth daily.   buPROPion  (WELLBUTRIN  SR) 150 MG 12 hr tablet Take 1 tablet (150 mg total) by mouth daily.   docusate sodium  (COLACE) 100 MG capsule Take 1 capsule (100 mg total) by mouth 2 (two) times daily.   escitalopram  (LEXAPRO ) 20 MG tablet Take 2 tablets (40 mg total) by mouth daily.   gabapentin  (NEURONTIN ) 400 MG capsule Take 1 capsule (400 mg total) by mouth 3 (three) times daily.   hydrochlorothiazide  (HYDRODIURIL ) 12.5 MG tablet Take 1 tablet (12.5 mg total) by mouth daily.   methocarbamol  (ROBAXIN ) 500 MG tablet Take 1 tablet (500 mg total) by mouth 3 (three) times daily.   mirabegron  ER (MYRBETRIQ ) 25 MG TB24 tablet Take 1 tablet (25 mg total) by mouth daily.   ondansetron  (ZOFRAN -ODT) 4 MG disintegrating tablet Take 1 tablet by translingual route every 6 hours as needed for nausea. Place on top of the tongue where it will dissolve, then swallow.   polycarbophil (FIBERCON) 625 MG tablet  Take 1 tablet (625 mg total) by mouth 2 (two) times daily.   promethazine  (PHENERGAN ) 25 MG suppository Place 1 suppository (25 mg total) rectally every 6 (six) hours as needed for nausea or vomiting.   SODIUM FLUORIDE , DENTAL RINSE, (PREVIDENT ) 0.2 % SOLN USE AS AN ORAL RINSE, AS DIRECTED ON PACKAGE   No facility-administered medications prior to visit.   Past Medical History:  Diagnosis Date   Anemia    Anxiety    Depression    GERD (gastroesophageal reflux disease)    Headache    Hypertension    Meningioma (HCC)    PONV (postoperative nausea and vomiting)     Past Surgical History:  Procedure Laterality Date   APPLICATION OF CRANIAL NAVIGATION N/A 02/21/2024   Procedure: COMPUTER-ASSISTED NAVIGATION, FOR CRANIAL PROCEDURE;  Surgeon: Gillie Duncans, MD;  Location: [facility] OR;  Service: Neurosurgery;  Laterality: N/A;   CRANIOTOMY  05/26/2016   Procedure: SUBOCCIPITAL CRANIOTOMY WITH PLACEMENT OF VENTRICULAR CATHETER;  Surgeon: Duncans Gillie, MD;  Location: [facility] OR;  Service: Neurosurgery;;  CRANIOTOMY - SUBOCCIPITAL   CRANIOTOMY Left 02/21/2024   Procedure: CRANIOTOMY TUMOR EXCISION;  Surgeon: Gillie Duncans, MD;  Location: [facility] OR;  Service: Neurosurgery;  Laterality: Left;  Occipital - left Craniotomy for tumor resection with Stealth   CRANIOTOMY Left 02/26/2024   Procedure: CRANIOTOMY TUMOR EXCISION;  Surgeon: Gillie Duncans, MD;  Location: [facility] OR;  Service: Neurosurgery;  Laterality: Left;  Craniotomy for tumor Stage II   LUMBAR WOUND DEBRIDEMENT N/A 03/18/2024   Procedure: WOUND DEBRIDEMENT;  Surgeon: Gillie Duncans, MD;  Location: [facility] OR;  Service: Neurosurgery;  Laterality: N/A;  Wound revision at cranial site   PLACEMENT OF LUMBAR DRAIN N/A 03/18/2024   Procedure: PLACEMENT OF LUMBAR DRAIN;  Surgeon: Gillie Duncans, MD;  Location: [facility] OR;  Service: Neurosurgery;  Laterality: N/A;  Placement of Lumbar Drain   VENTRICULOPERITONEAL SHUNT Right 03/24/2024   Procedure: SHUNT INSERTION  VENTRICULAR-PERITONEAL With Laparoscopic verification of shunt placement into the peritoneum;  Surgeon: Gillie Duncans, MD;  Location: [facility] OR;  Service: Neurosurgery;  Laterality: Right;   WISDOM TOOTH EXTRACTION       Family History  Problem Relation Age of Onset   Hypertension Mother    Heart disease Mother    Hypertension Father    Kidney disease Father    Ulcers Father    Diabetes Father        many paternal family members also with diabetes   Ovarian cancer Maternal Grandmother    Cirrhosis Paternal Grandfather        alcohol  related   Thyroid  cancer Maternal Aunt    Colon cancer Neg Hx    Esophageal cancer Neg Hx    Pancreatic cancer Neg Hx    Stomach cancer Neg Hx     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     Employer: [employer]  Tobacco Use   Smoking status: Never   Smokeless tobacco: Never  Vaping Use   Vaping status: Never Used  Substance and Sexual Activity   Alcohol  use: Yes    Comment: socially, maybe a glass every few months   Drug use: No   Sexual activity: Yes  Other Topics Concern   Not on file  Social History Narrative   Not on file   Social Drivers of Health   Financial Resource Strain: Not on file  Food Insecurity: No Food Insecurity (04/03/2024)   Hunger Vital Sign    Worried About Running Out of Food in the Last Year: Never true    Ran Out of Food in the Last Year: Never true  Transportation Needs: No Transportation Needs (04/03/2024)   PRAPARE - Administrator, Civil Service (Medical): No    Lack of Transportation (Non-Medical): No  Physical Activity: Not on file  Stress: Not on file  Social Connections: Not on file       ROS All review of systems negative except what is listed in the HPI    Objective      There were no vitals taken for this visit.  Physical Exam     Assessment & Plan:     Problem List Items Addressed This Visit   None Visit Diagnoses       Encounter for medical examination to establish care    -  Primary                No follow-ups on file.  Waddell KATHEE Mon, NP  I,Emily Lagle,acting as a scribe for Waddell KATHEE Mon, NP.,have documented all relevant documentation on the behalf of Waddell KATHEE Mon, NP.  I, Waddell KATHEE Mon,  NP, have reviewed all documentation for this visit. The documentation on 06/17/2024 for the exam, diagnosis, procedures, and orders are all accurate and complete.

## 2024-06-17 ENCOUNTER — Ambulatory Visit (INDEPENDENT_AMBULATORY_CARE_PROVIDER_SITE_OTHER): Admitting: Family Medicine

## 2024-06-17 ENCOUNTER — Encounter: Payer: Self-pay | Admitting: Family Medicine

## 2024-06-17 ENCOUNTER — Other Ambulatory Visit: Payer: Self-pay

## 2024-06-17 ENCOUNTER — Encounter (HOSPITAL_BASED_OUTPATIENT_CLINIC_OR_DEPARTMENT_OTHER): Payer: Self-pay | Admitting: Urology

## 2024-06-17 ENCOUNTER — Emergency Department (HOSPITAL_BASED_OUTPATIENT_CLINIC_OR_DEPARTMENT_OTHER)

## 2024-06-17 ENCOUNTER — Other Ambulatory Visit (HOSPITAL_BASED_OUTPATIENT_CLINIC_OR_DEPARTMENT_OTHER): Payer: Self-pay

## 2024-06-17 ENCOUNTER — Emergency Department (HOSPITAL_BASED_OUTPATIENT_CLINIC_OR_DEPARTMENT_OTHER)
Admission: EM | Admit: 2024-06-17 | Discharge: 2024-06-17 | Disposition: A | Discharge status: Home / Self Care | Attending: Emergency Medicine | Admitting: Emergency Medicine

## 2024-06-17 VITALS — BP 146/87 | HR 109 | Ht 68.0 in | Wt 191.0 lb

## 2024-06-17 DIAGNOSIS — R112 Nausea with vomiting, unspecified: Secondary | ICD-10-CM

## 2024-06-17 DIAGNOSIS — R1032 Left lower quadrant pain: Secondary | ICD-10-CM | POA: Diagnosis not present

## 2024-06-17 DIAGNOSIS — R42 Dizziness and giddiness: Secondary | ICD-10-CM | POA: Diagnosis not present

## 2024-06-17 DIAGNOSIS — K5730 Diverticulosis of large intestine without perforation or abscess without bleeding: Secondary | ICD-10-CM | POA: Diagnosis not present

## 2024-06-17 DIAGNOSIS — K5792 Diverticulitis of intestine, part unspecified, without perforation or abscess without bleeding: Secondary | ICD-10-CM

## 2024-06-17 DIAGNOSIS — R0989 Other specified symptoms and signs involving the circulatory and respiratory systems: Secondary | ICD-10-CM | POA: Diagnosis not present

## 2024-06-17 DIAGNOSIS — Z982 Presence of cerebrospinal fluid drainage device: Secondary | ICD-10-CM | POA: Diagnosis not present

## 2024-06-17 DIAGNOSIS — R930 Abnormal findings on diagnostic imaging of skull and head, not elsewhere classified: Secondary | ICD-10-CM | POA: Diagnosis not present

## 2024-06-17 DIAGNOSIS — D259 Leiomyoma of uterus, unspecified: Secondary | ICD-10-CM | POA: Diagnosis not present

## 2024-06-17 DIAGNOSIS — K7689 Other specified diseases of liver: Secondary | ICD-10-CM | POA: Diagnosis not present

## 2024-06-17 DIAGNOSIS — Z0000 Encounter for general adult medical examination without abnormal findings: Secondary | ICD-10-CM

## 2024-06-17 LAB — CBC WITH DIFFERENTIAL/PLATELET
Abs Immature Granulocytes: 0.02 K/uL (ref 0.00–0.07)
Basophils Absolute: 0 K/uL (ref 0.0–0.1)
Basophils Relative: 0 %
Eosinophils Absolute: 0.1 K/uL (ref 0.0–0.5)
Eosinophils Relative: 2 %
HCT: 36.2 % (ref 36.0–46.0)
Hemoglobin: 11.7 g/dL — ABNORMAL LOW (ref 12.0–15.0)
Immature Granulocytes: 0 %
Lymphocytes Relative: 22 %
Lymphs Abs: 1.3 K/uL (ref 0.7–4.0)
MCH: 25.6 pg — ABNORMAL LOW (ref 26.0–34.0)
MCHC: 32.3 g/dL (ref 30.0–36.0)
MCV: 79.2 fL — ABNORMAL LOW (ref 80.0–100.0)
Monocytes Absolute: 0.7 K/uL (ref 0.1–1.0)
Monocytes Relative: 12 %
Neutro Abs: 3.8 K/uL (ref 1.7–7.7)
Neutrophils Relative %: 64 %
Platelets: 494 K/uL — ABNORMAL HIGH (ref 150–400)
RBC: 4.57 MIL/uL (ref 3.87–5.11)
RDW: 13 % (ref 11.5–15.5)
WBC: 6 K/uL (ref 4.0–10.5)
nRBC: 0 % (ref 0.0–0.2)

## 2024-06-17 LAB — COMPREHENSIVE METABOLIC PANEL WITH GFR
ALT: 9 U/L (ref 0–44)
AST: 14 U/L — ABNORMAL LOW (ref 15–41)
Albumin: 3.8 g/dL (ref 3.5–5.0)
Alkaline Phosphatase: 80 U/L (ref 38–126)
Anion gap: 14 (ref 5–15)
BUN: 7 mg/dL (ref 6–20)
CO2: 25 mmol/L (ref 22–32)
Calcium: 10 mg/dL (ref 8.9–10.3)
Chloride: 103 mmol/L (ref 98–111)
Creatinine, Ser: 0.79 mg/dL (ref 0.44–1.00)
GFR, Estimated: >60 mL/min (ref >60)
Glucose, Bld: 109 mg/dL — ABNORMAL HIGH (ref 70–99)
Potassium: 3.6 mmol/L (ref 3.5–5.1)
Sodium: 142 mmol/L (ref 135–145)
Total Bilirubin: 0.3 mg/dL (ref 0.0–1.2)
Total Protein: 7.9 g/dL (ref 6.5–8.1)

## 2024-06-17 LAB — URINALYSIS, ROUTINE W REFLEX MICROSCOPIC
Bilirubin Urine: NEGATIVE
Glucose, UA: NEGATIVE mg/dL
Hgb urine dipstick: NEGATIVE
Ketones, ur: 15 mg/dL — AB
Leukocytes,Ua: NEGATIVE
Nitrite: NEGATIVE
Protein, ur: NEGATIVE mg/dL
Specific Gravity, Urine: 1.01 (ref 1.005–1.030)
pH: 5.5 (ref 5.0–8.0)

## 2024-06-17 LAB — LIPASE, BLOOD: Lipase: 12 U/L (ref 11–51)

## 2024-06-17 MED ORDER — MECLIZINE HCL 25 MG PO TABS
25.0000 mg | ORAL_TABLET | Freq: Once | ORAL | Status: AC
  Administered 2024-06-17: 25 mg via ORAL
  Filled 2024-06-17: qty 1

## 2024-06-17 MED ORDER — AMOXICILLIN-POT CLAVULANATE 875-125 MG PO TABS
1.0000 | ORAL_TABLET | Freq: Two times a day (BID) | ORAL | 0 refills | Status: AC
  Filled 2024-06-17: qty 14, 7d supply, fill #0

## 2024-06-17 MED ORDER — METOCLOPRAMIDE HCL 10 MG PO TABS
10.0000 mg | ORAL_TABLET | Freq: Four times a day (QID) | ORAL | 0 refills | Status: AC | PRN
  Filled 2024-06-17: qty 30, 8d supply, fill #0

## 2024-06-17 MED ORDER — MECLIZINE HCL 25 MG PO TABS
25.0000 mg | ORAL_TABLET | Freq: Two times a day (BID) | ORAL | 0 refills | Status: DC | PRN
  Filled 2024-06-17: qty 30, 15d supply, fill #0

## 2024-06-17 MED ORDER — ONDANSETRON HCL 4 MG/2ML IJ SOLN
4.0000 mg | Freq: Once | INTRAMUSCULAR | Status: AC
  Administered 2024-06-17: 4 mg via INTRAVENOUS
  Filled 2024-06-17: qty 2

## 2024-06-17 MED ORDER — LACTATED RINGERS IV BOLUS
1000.0000 mL | Freq: Once | INTRAVENOUS | Status: AC
  Administered 2024-06-17: 1000 mL via INTRAVENOUS

## 2024-06-17 MED ORDER — IOHEXOL 300 MG/ML  SOLN
100.0000 mL | Freq: Once | INTRAMUSCULAR | Status: AC | PRN
  Administered 2024-06-17: 100 mL via INTRAVENOUS

## 2024-06-17 NOTE — ED Provider Notes (Signed)
 [facility] Provider Note   CSN: [id number] Arrival date & time: 06/17/24  1004     Patient presents with: Dizziness and Emesis   Alexa [name] is a 53 y.o. female.  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  Patient states her dizziness is lightheadedness and she has only had 1 episode of the room spinning.  She also notes she is continually nauseous and vomits about every day.  She has tried Zofran .  She was also prescribed Phenergan  suppositories but does not like taking them.  She has had the symptoms since the VP shunt placement in September but symptoms have continued to get worse.  She was seen by her PCP this morning and advised to come to the ED for evaluation of continued worsening symptoms.  Of note patient does have an MRI scheduled on November 20 to assess for potential shunt failure.  Patient also notes intermittent abdominal pain and diarrhea for the past 2 days.  Notes she had diverticulitis approximately 1 month ago and this feels similar.  States diarrhea has been watery and denies any noted blood.  Denies headaches, vision changes, syncope, chest pain, shortness of breath, urinary symptoms.  No further complaints.  Dizziness Associated symptoms: diarrhea, nausea, vomiting and weakness   Associated symptoms: no chest pain, no headaches and no shortness of breath   Emesis Associated symptoms: abdominal pain and diarrhea   Associated symptoms: no chills, no fever and no headaches        Prior to Admission medications   Medication Sig Start Date End Date Taking? Authorizing Provider  acetaminophen  (TYLENOL ) 325 MG tablet Take 2 tablets (650 mg total) by mouth in the morning, at noon, and at bedtime. 04/14/24   Love, Sharlet RAMAN, PA-C   atenolol  (TENORMIN ) 50 MG tablet Take 1 tablet (50 mg total) by mouth daily. 05/21/23     buPROPion  (WELLBUTRIN  SR) 150 MG 12 hr tablet Take 1 tablet (150 mg total) by mouth daily. 04/25/24 04/25/25  Geralene Kaiser, MD  escitalopram  (LEXAPRO ) 20 MG tablet Take 2 tablets (40 mg total) by mouth daily. 04/25/24   Geralene Kaiser, MD  hydrochlorothiazide  (HYDRODIURIL ) 12.5 MG tablet Take 1 tablet (12.5 mg total) by mouth daily. 05/21/23     promethazine  (PHENERGAN ) 25 MG suppository Place 1 suppository (25 mg total) rectally every 6 (six) hours as needed for nausea or vomiting. 05/31/24   Desiderio Chew, PA-C  SODIUM FLUORIDE , DENTAL RINSE, (PREVIDENT ) 0.2 % SOLN USE AS AN ORAL RINSE, AS DIRECTED ON PACKAGE 12/13/23       Allergies: Watermelon [citrullus vulgaris]    Review of Systems  Constitutional:  Negative for chills and fever.  Eyes:  Negative for visual disturbance.  Respiratory:  Negative for shortness of breath.   Cardiovascular:  Negative for chest pain.  Gastrointestinal:  Positive for abdominal pain, diarrhea, nausea and vomiting. Negative for constipation.  Neurological:  Positive for dizziness and weakness. Negative for syncope, numbness and headaches.  All other systems reviewed and are negative.   Updated Vital Signs BP 134/89 (BP Location: Right Arm)   Pulse 95   Temp 97.9 F (36.6 C) (Oral)   Resp 16   Ht 5' 8 (1.727 m)   Wt 86.6 kg   SpO2 98%   BMI 29.03  kg/m   Physical Exam Constitutional:      Appearance: Normal appearance.  HENT:     Head: Normocephalic and atraumatic.     Nose: Nose normal.     Mouth/Throat:     Mouth: Mucous membranes are moist.     Pharynx: Oropharynx is clear.  Eyes:     Extraocular Movements: Extraocular movements intact.     Pupils: Pupils are equal, round, and reactive to light.  Cardiovascular:     Rate and Rhythm: Normal rate.  Pulmonary:     Effort: Pulmonary effort is normal.  Abdominal:     General: Bowel sounds are normal.      Palpations: Abdomen is soft.     Tenderness: There is abdominal tenderness.     Comments: Left lower quadrant abdominal tenderness appreciated.  No masses, distention, guarding.  Musculoskeletal:        General: Normal range of motion.  Skin:    General: Skin is warm and dry.  Neurological:     Mental Status: She is alert and oriented to person, place, and time.     Cranial Nerves: No cranial nerve deficit.     Comments: Equal handgrip bilaterally.  Equal strength in all 4 extremities with no acute deficits noted.  No acute sensory or cranial nerve deficits.  Psychiatric:        Mood and Affect: Mood normal.        Behavior: Behavior normal.     (all labs ordered are listed, but only abnormal results are displayed) Labs Reviewed  COMPREHENSIVE METABOLIC PANEL WITH GFR  CBC WITH DIFFERENTIAL/PLATELET  LIPASE, BLOOD  URINALYSIS, ROUTINE W REFLEX MICROSCOPIC    EKG: EKG Interpretation Date/Time:  Monday June 17 2024 10:24:51 EST Ventricular Rate:  89 PR Interval:  164 QRS Duration:  85 QT Interval:  345 QTC Calculation: 420 R Axis:   12  Text Interpretation: Sinus rhythm Confirmed by Yolande Charleston [phone]) on 06/17/2024 10:28:32 AM  Radiology: No results found.    Medications Ordered in the ED  lactated ringers  bolus 1,000 mL (has no administration in time range)  ondansetron  (ZOFRAN ) injection 4 mg (has no administration in time range)    Clinical Course as of 06/17/24 1104  Mon Jun 17, 2024  1047 Hemoglobin(!): 11.7 Improved from baseline [AY]    Clinical Course User Index [AY] Neysa Thersia RAMAN, PA-C                                Medical Decision Making Amount and/or Complexity of Data Reviewed Labs: ordered. Decision-making details documented in ED Course. Radiology: ordered.  Risk Prescription drug management.   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  Patient notes she has had issues with the symptoms since her procedure in September of this year but symptoms have continued to get worse.  Sent by PCP for evaluation.  On exam patient is alert and in no acute distress.  Physical exam as noted above.  Neurovascular intact with no acute cranial nerve deficits.  CBC, CMP, lipase stable compared to baseline.  UA shows mild dehydration but no acute signs of infection.  EKG shows sinus rhythm.  Differential acute viral illness, shunt malfunction, increasing meningioma, meningitis, TIA/CVA.  Shunt series today is unremarkable with no  displacement.  CT of the head today reveals stable postop changes with no acute abnormalities.  The ventriculostomy catheter is in place without any acute interval changes in size of the lateral ventricles and no ventriculomegaly.  There is also a persistent large fluid collection overlying the left occipital craniectomy with no acute changes.  CT abdomen pelvis shows concerns for mild diverticulitis but otherwise VP shunt is in place with no obvious fluid.  Also incidental uterine fibroid.  Unfortunately suspect patient's symptoms are secondary to chronic issue syncope the patient basement.  Patient was given Zofran , fluids, meclizine with some improvement in symptoms.  She notes the lightheadedness will only return when she moves.  No acute deficits noted and vital signs stable.  Stable for discharge home.  Prescribed Reglan and Augmentin for diverticulitis.  Symptomatic care discussed.  Advised to follow-up for MRI in the next few days and neurology as scheduled in the next couple weeks for further management of continued symptoms.  Return precautions provided.     Final diagnoses:  None    ED Discharge Orders     None          Neysa Thersia GORMAN DEVONNA 06/17/24 1650    Jerrol Agent, MD 06/17/24 1800

## 2024-06-17 NOTE — ED Triage Notes (Signed)
 Pt states dizziness, N/V that started end of October  Pt concerned for dehydration  States dizziness worse with any movement, states some pressure behind eyes and states some vision changes, has eye sx scheduled  Denies pain   VP shunt placed in August

## 2024-06-17 NOTE — Discharge Instructions (Signed)
 May try meclizine twice a day for the next few days to see if this helps with the lightheadedness/dizziness at all.  May take Reglan every 6 hours as needed for nausea/vomiting.  Do not take other nausea medications at the same time.  Stay hydrated and drink plenty of clear fluids.  Please follow-up for MRI as scheduled and with neurology for follow-up.  Follow-up with PCP for continued symptoms in the meantime.  Please return to ED if any symptoms worsen including uncontrollable nausea/vomiting, severe headaches, syncopal episode, new fevers, severe abdominal pain.

## 2024-06-17 NOTE — ED Notes (Signed)
 ED Provider at bedside.

## 2024-06-17 NOTE — ED Notes (Signed)
Patient transported to CT and back without event.

## 2024-06-20 ENCOUNTER — Telehealth: Payer: Self-pay

## 2024-06-20 ENCOUNTER — Other Ambulatory Visit

## 2024-06-20 NOTE — Telephone Encounter (Signed)
 Copied from CRM [id number]. Topic: Clinical - Request for Lab/Test Order >> Jun 20, 2024  3:10 PM Alexa [name] wrote: Reason for CRM: pt is stating the scheduling department for her brain MRI keeps rescheduling her and pushing her date further out. Pt is asking if Miss Alexa [name] can put a new order in for this to be STAT. Please reach out to pt in regards and further discuss. Best call back # [phone].

## 2024-06-20 NOTE — Telephone Encounter (Signed)
 She is scheduled 07/08/2024, please review and advise.

## 2024-06-20 NOTE — Telephone Encounter (Signed)
 Neuro is ordering this. She needs to reach out to them to see if it can be escalated.

## 2024-06-21 NOTE — Telephone Encounter (Signed)
 Patient made aware.

## 2024-06-26 ENCOUNTER — Ambulatory Visit: Payer: Self-pay

## 2024-06-26 NOTE — Telephone Encounter (Signed)
 Please advise

## 2024-06-26 NOTE — Telephone Encounter (Signed)
 Called spoke with Kaelyn regarding patient requesting patch for dizziness and alternative nausea medication.  Patient cannot keep down solids or pills.  Kaeyln reports will message Almarie NP.

## 2024-06-26 NOTE — Telephone Encounter (Signed)
 Patient made aware. She expresses understanding.

## 2024-06-26 NOTE — Telephone Encounter (Signed)
 I have not spoke w/ this Pt. Unsure who they are referring to.

## 2024-06-26 NOTE — Telephone Encounter (Signed)
 Given her neuro history, I do not feel comfortable giving scopolamine  patches for symptom management (this also may not be the best choice d/t impact on GI motility while recovering from diverticulitis. She needs to try the promethazine  suppositories that are on her med list. If severe nausea/vomiting, concern for dehydration, etc needs to go to ED for fluids and IV meds. She also needs to inform her neurologist.

## 2024-06-26 NOTE — Telephone Encounter (Signed)
 FYI Only or Action Required?: FYI only for provider: ED advised and Patient declines, see Notes.  Patient was last seen in primary care on 06/17/2024 by Almarie Waddell NOVAK, NP.  Called Nurse Triage reporting Vomiting.  Symptoms began several days ago.  Interventions attempted: Nothing.  Symptoms are: gradually worsening.  Triage Disposition: Go to ED Now (or PCP Triage)  Patient/caregiver understands and will follow disposition?: No, wishes to speak with PCP   Copied from CRM #[id number]. Topic: Clinical - Red Word Triage >> Jun 26, 2024  4:10 PM Thersia BROCKS wrote: [facility] that prompted transfer to Nurse Triage: Patient called in stated she still have Diverticulitis , and also stated she is very nausea and has throw up her antibiotic wants to know if she can be prescribe a patch instead of a pill for dizziness Reason for Disposition  [1] Drinking very little AND [2] dehydration suspected (e.g., no urine > 12 hours, very dry mouth, very lightheaded)  Answer Assessment - Initial Assessment Questions Advised ED now. Patient declines.  Patient reports unable to go and have no one to drive. Offered to call 911, patient declined.  Advised 911 if symptoms worsen.  Patient requesting anti dizziness medication in patch form due to vomiting.  Patient cannot keep down antibiotics, BP meds, anti nausea, anti dizziness or any solids. Only drinking clears and unsure if urinated today.  1. DESCRIPTION: Describe your dizziness.     Vertigo;ongoing symptom already see by pcp and neuro 2. LIGHTHEADED: Do you feel lightheaded? (e.g., somewhat faint, woozy, weak upon standing) denies 10. OTHER SYMPTOMS: Do you have any other symptoms? (e.g., fever, chest pain, vomiting, diarrhea, bleeding)       Abd pain after eating intermittent; 4/10, not currently left lower abd, below belly button, nausea Vomited 3x in last 24 hours r/t dizziness  Denies fever, chills, diarrhea Last BM-yesterday,  normal, Able to keep down fluids, decreased output due to not drinking much  Protocols used: Dizziness - Lightheadedness-A-AH

## 2024-07-01 DIAGNOSIS — F411 Generalized anxiety disorder: Secondary | ICD-10-CM | POA: Diagnosis not present

## 2024-07-02 DIAGNOSIS — D329 Benign neoplasm of meninges, unspecified: Secondary | ICD-10-CM | POA: Diagnosis not present

## 2024-07-02 DIAGNOSIS — G9389 Other specified disorders of brain: Secondary | ICD-10-CM | POA: Diagnosis not present

## 2024-07-02 DIAGNOSIS — D320 Benign neoplasm of cerebral meninges: Secondary | ICD-10-CM | POA: Diagnosis not present

## 2024-07-08 ENCOUNTER — Inpatient Hospital Stay: Admission: RE | Admit: 2024-07-08 | Source: Ambulatory Visit

## 2024-07-10 ENCOUNTER — Other Ambulatory Visit (HOSPITAL_COMMUNITY): Payer: Self-pay

## 2024-07-10 ENCOUNTER — Ambulatory Visit: Admitting: Gastroenterology

## 2024-07-10 ENCOUNTER — Other Ambulatory Visit

## 2024-07-10 ENCOUNTER — Other Ambulatory Visit (HOSPITAL_BASED_OUTPATIENT_CLINIC_OR_DEPARTMENT_OTHER): Payer: Self-pay

## 2024-07-10 ENCOUNTER — Encounter: Payer: Self-pay | Admitting: Gastroenterology

## 2024-07-10 VITALS — BP 118/72 | HR 100 | Ht 68.0 in | Wt 188.0 lb

## 2024-07-10 DIAGNOSIS — D649 Anemia, unspecified: Secondary | ICD-10-CM | POA: Diagnosis not present

## 2024-07-10 DIAGNOSIS — R933 Abnormal findings on diagnostic imaging of other parts of digestive tract: Secondary | ICD-10-CM

## 2024-07-10 DIAGNOSIS — R112 Nausea with vomiting, unspecified: Secondary | ICD-10-CM

## 2024-07-10 DIAGNOSIS — R1030 Lower abdominal pain, unspecified: Secondary | ICD-10-CM

## 2024-07-10 DIAGNOSIS — R634 Abnormal weight loss: Secondary | ICD-10-CM | POA: Diagnosis not present

## 2024-07-10 LAB — COMPREHENSIVE METABOLIC PANEL WITH GFR
ALT: 10 U/L (ref 0–35)
AST: 13 U/L (ref 0–37)
Albumin: 3.5 g/dL (ref 3.5–5.2)
Alkaline Phosphatase: 83 U/L (ref 39–117)
BUN: 4 mg/dL — ABNORMAL LOW (ref 6–23)
CO2: 27 meq/L (ref 19–32)
Calcium: 10 mg/dL (ref 8.4–10.5)
Chloride: 101 meq/L (ref 96–112)
Creatinine, Ser: 0.64 mg/dL (ref 0.40–1.20)
GFR: 100.51 mL/min (ref >60.00)
Glucose, Bld: 109 mg/dL — ABNORMAL HIGH (ref 70–99)
Potassium: 3.5 meq/L (ref 3.5–5.1)
Sodium: 139 meq/L (ref 135–145)
Total Bilirubin: 0.3 mg/dL (ref 0.2–1.2)
Total Protein: 8 g/dL (ref 6.0–8.3)

## 2024-07-10 LAB — CBC WITH DIFFERENTIAL/PLATELET
Basophils Absolute: 0 K/uL (ref 0.0–0.1)
Basophils Relative: 1.2 % (ref 0.0–3.0)
Eosinophils Absolute: 0.2 K/uL (ref 0.0–0.7)
Eosinophils Relative: 5.7 % — ABNORMAL HIGH (ref 0.0–5.0)
HCT: 33.4 % — ABNORMAL LOW (ref 36.0–46.0)
Hemoglobin: 11.1 g/dL — ABNORMAL LOW (ref 12.0–15.0)
Lymphocytes Relative: 34.1 % (ref 12.0–46.0)
Lymphs Abs: 1.3 K/uL (ref 0.7–4.0)
MCHC: 33.4 g/dL (ref 30.0–36.0)
MCV: 75.8 fl — ABNORMAL LOW (ref 78.0–100.0)
Monocytes Absolute: 0.5 K/uL (ref 0.1–1.0)
Monocytes Relative: 12.2 % — ABNORMAL HIGH (ref 3.0–12.0)
Neutro Abs: 1.8 K/uL (ref 1.4–7.7)
Neutrophils Relative %: 46.8 % (ref 43.0–77.0)
Platelets: 662 K/uL — ABNORMAL HIGH (ref 150.0–400.0)
RBC: 4.4 Mil/uL (ref 3.87–5.11)
RDW: 14 % (ref 11.5–15.5)
WBC: 3.9 K/uL — ABNORMAL LOW (ref 4.0–10.5)

## 2024-07-10 LAB — C-REACTIVE PROTEIN: CRP: 5.3 mg/dL (ref 0.5–20.0)

## 2024-07-10 LAB — IBC + FERRITIN
Ferritin: 79.9 ng/mL (ref 10.0–291.0)
Iron: 54 ug/dL (ref 42–145)
Saturation Ratios: 20.1 % (ref 20.0–50.0)
TIBC: 268.8 ug/dL (ref 250.0–450.0)
Transferrin: 192 mg/dL — ABNORMAL LOW (ref 212.0–360.0)

## 2024-07-10 LAB — SEDIMENTATION RATE: Sed Rate: 79 mm/h — ABNORMAL HIGH (ref 0–30)

## 2024-07-10 LAB — B12 AND FOLATE PANEL
Folate: 8.5 ng/mL (ref >5.9)
Vitamin B-12: 245 pg/mL (ref 211–911)

## 2024-07-10 LAB — FERRITIN: Ferritin: 74 ng/mL (ref 10.0–291.0)

## 2024-07-10 MED ORDER — MESALAMINE 1.2 G PO TBEC
2.4000 g | DELAYED_RELEASE_TABLET | Freq: Every day | ORAL | 1 refills | Status: AC
  Filled 2024-07-10 (×2): qty 60, 30d supply, fill #0

## 2024-07-10 NOTE — Patient Instructions (Addendum)
 Your provider has requested that you go to the basement level for lab work before leaving today. Press B on the elevator. The lab is located at the first door on the left as you exit the elevator.  Due to recent changes in healthcare laws, you may see the results of your imaging and laboratory studies on MyChart before your provider has had a chance to review them.  We understand that in some cases there may be results that are confusing or concerning to you. Not all laboratory results come back in the same time frame and the provider may be waiting for multiple results in order to interpret others.  Please give us  48 hours in order for your provider to thoroughly review all the results before contacting the office for clarification of your results.   We have sent the following medications to your pharmacy for you to pick up at your convenience: Lialda  1.2 G tablets.  Take 2 tablets daily.  Call immediately if symptoms worsen.  Call with an update in 2 weeks.  Thank you for trusting me with your gastrointestinal care!   Camie Furbish, PA-C   _______________________________________________________  If your blood pressure at your visit was 140/90 or greater, please contact your primary care physician to follow up on this.  _______________________________________________________  If you are age 23 or older, your body mass index should be between 23-30. Your Body mass index is 28.59 kg/m. If this is out of the aforementioned range listed, please consider follow up with your Primary Care Provider.  If you are age 44 or younger, your body mass index should be between 19-25. Your Body mass index is 28.59 kg/m. If this is out of the aformentioned range listed, please consider follow up with your Primary Care Provider.   ________________________________________________________  The [facility] providers would like to encourage you to use MYCHART to communicate with providers for non-urgent  requests or questions.  Due to long hold times on the telephone, sending your provider a message by [facility] may be a faster and more efficient way to get a response.  Please allow 48 business hours for a response.  Please remember that this is for non-urgent requests.  _______________________________________________________  [facility] is using a team-based approach to care.  Your team is made up of your doctor and two to three APPS. Our APPS (Nurse Practitioners and Physician Assistants) work with your physician to ensure care continuity for you. They are fully qualified to address your health concerns and develop a treatment plan. They communicate directly with your gastroenterologist to care for you. Seeing the Advanced Practice Practitioners on your physician's team can help you by facilitating care more promptly, often allowing for earlier appointments, access to diagnostic testing, procedures, and other specialty referrals.

## 2024-07-10 NOTE — Progress Notes (Signed)
 Alexa [name] [id number] 04-24-1971   Chief Complaint: Abdominal pain  Referring Provider: Almarie Waddell NOVAK, NP Primary GI MD: Dr. Legrand  HPI: Alexa [name] is a 53 y.o. 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    Last seen in office 03/22/2019 by Dr. Legrand for upper abdominal pain.  Symptoms did improve with Prilosec.  Plan at that time was for upper endoscopy.  She will schedule for procedure, but had to cancel as her care partner was exposed to COVID and she did not have anyone else to drive her.  Seen in the ED 05/31/2024 for evaluation of abdominal pain.  Had tenderness to palpation of right lower quadrant, periumbilical area, and suprapubic area.  CT of the abdomen and pelvis was ordered and showed possible mild descending/sigmoid diverticulitis.  Labs stable.  She was prescribed Augmentin  for 7 days, Phenergan .  Also endorsed having recent brain surgery for meningioma, causing frequent headaches.  Had shunt placed.  Working with neurosurgeon to determine if shunt is working properly since she had intermittent nausea and vomiting, intermittent headaches, intermittent dizziness described as vertigo.  MRI was ordered and pending.  No VP shunt abnormality on CT scan done in the ED.  Seen in the ED 06/17/2024 for increasing dizziness and nausea/vomiting for 2 to 3 weeks.  Had tried Zofran .  Was previously prescribed Phenergan  suppositories but did not like taking them.  Symptoms present since VP shunt placement in September and have continually worsened.  Also endorsed intermittent abdominal pain and diarrhea for the past 2 days.  LLQ tenderness appreciated on exam. Labs are stable.  UA showed mild dehydration but no acute signs of infection.  EKG showed normal sinus rhythm. Shunt series  was unremarkable with no displacement.  CT of the head revealed stable postop changes with no acute abnormalities. CT abdomen/pelvis showed concern for mild diverticulitis but otherwise VP shunt was in place with no obvious fluid.  Incidental uterine fibroid seen.  She was given Reglan  and Augmentin  for diverticulitis.  Advised to follow-up for MRI in the next few days and with neurology as scheduled for further management.   Discussed the use of AI scribe software for clinical note transcription with the patient, who gave verbal consent to proceed.  History of Present Illness Alexa [name] is a 53 year old female with recurrent diverticulitis who presents with persistent abdominal pain and gastrointestinal symptoms.  Abdominal pain - Recurrent severe abdominal pain with episodes lasting up to one week - Pain location varies: left, right, or mid-abdomen - Pain radiates to the epigastric region and ribs when severe  Nausea and vomiting - Frequent nausea and vomiting during episodes - Uses anti-nausea medication, but is not effective - Associated dizziness when anti-nausea medication is ineffective - No blood or coffee ground-like material in vomitus  Altered bowel habits - Diet restricted primarily to liquids due to intolerance of solid foods during flare-ups - Weight loss from 215-220 pounds to 188 pounds - One episode of diarrhea; otherwise, bowel movements are formed but infrequent due to decreased oral intake - No blood in stool - No melena  Antibiotic therapy for diverticulitis - Two emergency room visits for abdominal pain - First ER visit in October: CT scan showed possible mild diverticulitis - Prescribed Augmentin  for 7 days, but symptoms persisted, possibly  due to vomiting - Second course of Augmentin  also ineffective - No prior use of ciprofloxacin or metronidazole  Gastrointestinal symptoms - No acid reflux or heartburn  Gynecologic history - No longer  menstruating   States she recently had brain MRI and her VP shunt is not working. Pressure was adjusted. Might need a new shunt.  Her VP shunt is not working, adjusted pressure Might need a new shunt   Previous GI Procedures/Imaging   CT A/P 06/17/2024 IMPRESSION: 1. Extensive colonic diverticulosis with mild edema adjacent to the distal descending and proximal sigmoid colon, likely diverticulitis. 2. VP shunt catheter in place without surrounding fluid collection. Small volume pelvic fluid could be physiologic or related to the VP shunt catheter. 3. Uterine fibroid  CT A/P 05/31/2024 IMPRESSION: 1. Possible mild diverticulitis of the distal descending and sigmoid colon. 2. Small amount of pelvic free fluid, possibly physiologic.   Past Medical History:  Diagnosis Date   Anemia    Anxiety    Depression    GERD (gastroesophageal reflux disease)    Headache    Hypertension    Meningioma (HCC)    PONV (postoperative nausea and vomiting)     Past Surgical History:  Procedure Laterality Date   APPLICATION OF CRANIAL NAVIGATION N/A 02/21/2024   Procedure: COMPUTER-ASSISTED NAVIGATION, FOR CRANIAL PROCEDURE;  Surgeon: Gillie Duncans, MD;  Location: [facility] OR;  Service: Neurosurgery;  Laterality: N/A;   CRANIOTOMY  05/26/2016   Procedure: SUBOCCIPITAL CRANIOTOMY WITH PLACEMENT OF VENTRICULAR CATHETER;  Surgeon: Duncans Gillie, MD;  Location: [facility] OR;  Service: Neurosurgery;;  CRANIOTOMY - SUBOCCIPITAL   CRANIOTOMY Left 02/21/2024   Procedure: CRANIOTOMY TUMOR EXCISION;  Surgeon: Gillie Duncans, MD;  Location: [facility] OR;  Service: Neurosurgery;  Laterality: Left;  Occipital - left Craniotomy for tumor resection with Stealth   CRANIOTOMY Left 02/26/2024   Procedure: CRANIOTOMY TUMOR EXCISION;  Surgeon: Gillie Duncans, MD;  Location: [facility] OR;  Service: Neurosurgery;  Laterality: Left;  Craniotomy for tumor Stage II   LUMBAR WOUND DEBRIDEMENT N/A 03/18/2024   Procedure: WOUND DEBRIDEMENT;  Surgeon:  Gillie Duncans, MD;  Location: [facility] OR;  Service: Neurosurgery;  Laterality: N/A;  Wound revision at cranial site   PLACEMENT OF LUMBAR DRAIN N/A 03/18/2024   Procedure: PLACEMENT OF LUMBAR DRAIN;  Surgeon: Gillie Duncans, MD;  Location: [facility] OR;  Service: Neurosurgery;  Laterality: N/A;  Placement of Lumbar Drain   VENTRICULOPERITONEAL SHUNT Right 03/24/2024   Procedure: SHUNT INSERTION VENTRICULAR-PERITONEAL With Laparoscopic verification of shunt placement into the peritoneum;  Surgeon: Gillie Duncans, MD;  Location: [facility] OR;  Service: Neurosurgery;  Laterality: Right;   WISDOM TOOTH EXTRACTION      Current Outpatient Medications  Medication Sig Dispense Refill   meclizine  (ANTIVERT ) 12.5 MG tablet Take 12.5 mg by mouth 3 (three) times daily as needed for dizziness.     acetaminophen  (TYLENOL ) 325 MG tablet Take 2 tablets (650 mg total) by mouth in the morning, at noon, and at bedtime. (Patient not taking: Reported on 07/10/2024)     atenolol  (TENORMIN ) 50 MG tablet Take 1 tablet (50 mg total) by mouth daily. (Patient not taking: Reported on 07/10/2024) 90 tablet 3   buPROPion  (WELLBUTRIN  SR) 150 MG 12 hr tablet Take 1 tablet (150 mg total) by mouth daily. (Patient not taking: Reported on 07/10/2024) 30 tablet 0   escitalopram  (LEXAPRO ) 20 MG tablet Take 2 tablets (40 mg total) by mouth daily. (Patient not taking: Reported on 07/10/2024) 60 tablet 0   hydrochlorothiazide  (HYDRODIURIL ) 12.5  MG tablet Take 1 tablet (12.5 mg total) by mouth daily. (Patient not taking: Reported on 07/10/2024) 90 tablet 3   metoCLOPramide  (REGLAN ) 10 MG tablet Take 1 tablet (10 mg total) by mouth every 6 (six) hours as needed for nausea or vomiting. (Patient not taking: Reported on 07/10/2024) 30 tablet 0   promethazine  (PHENERGAN ) 25 MG suppository Place 1 suppository (25 mg total) rectally every 6 (six) hours as needed for nausea or vomiting. (Patient not taking: Reported on 07/10/2024) 12 each 0   SODIUM FLUORIDE , DENTAL  RINSE, (PREVIDENT ) 0.2 % SOLN USE AS AN ORAL RINSE, AS DIRECTED ON PACKAGE (Patient not taking: Reported on 07/10/2024) 473 mL 5   No current facility-administered medications for this visit.    Allergies as of 07/10/2024 - Review Complete 07/10/2024  Allergen Reaction Noted   Watermelon [citrullus vulgaris] Nausea And Vomiting 02/05/2024    Family History  Problem Relation Age of Onset   Hypertension Mother    Heart disease Mother    Hypertension Father    Kidney disease Father    Ulcers Father    Diabetes Father        many paternal family members also with diabetes   Ovarian cancer Maternal Grandmother    Cirrhosis Paternal Grandfather        alcohol  related   Thyroid  cancer Maternal Aunt    Colon cancer Neg Hx    Esophageal cancer Neg Hx    Pancreatic cancer Neg Hx    Stomach cancer Neg Hx     Social History   Tobacco Use   Smoking status: Never   Smokeless tobacco: Never  Vaping Use   Vaping status: Never Used  Substance Use Topics   Alcohol  use: Yes    Comment: socially, maybe a glass every few months   Drug use: No     Review of Systems:    Constitutional: Unintentional weight loss. No fever/chills Cardiovascular: No chest pain Respiratory: No SOB Gastrointestinal: See HPI and otherwise negative Neurological: Dizziness    Physical Exam:  Vital signs: BP 118/72   Pulse 100   Ht 5' 8 (1.727 m)   Wt 188 lb (85.3 kg)   BMI 28.59 kg/m   Wt Readings from Last 3 Encounters:  07/10/24 188 lb (85.3 kg)  06/17/24 190 lb 14.7 oz (86.6 kg)  06/17/24 191 lb (86.6 kg)    Was 211 lb back in September  Constitutional: Pleasant female in NAD though appears tired, alert and cooperative, ambulating with walker Head:  Normocephalic and atraumatic.  Respiratory: Respirations even and unlabored. Lungs clear to auscultation bilaterally.  No wheezes, crackles, or rhonchi.  Cardiovascular:  Regular rate and rhythm. No murmurs. No peripheral  edema. Gastrointestinal:  Soft, nondistended, mildly tender to palpation of lower abdomen. No rebound or guarding. Normal bowel sounds. No appreciable masses or hepatomegaly. Rectal:  Not performed.  Neurologic:  Alert and oriented x4;  grossly normal neurologically.  Skin:   Dry and intact without significant lesions or rashes. Psychiatric: Oriented to person, place and time. Demonstrates good judgement and reason without abnormal affect or behaviors.   RELEVANT LABS AND IMAGING: CBC    Component Value Date/Time   WBC 6.0 06/17/2024 1016   RBC 4.57 06/17/2024 1016   HGB 11.7 (L) 06/17/2024 1016   HCT 36.2 06/17/2024 1016   PLT 494 (H) 06/17/2024 1016   MCV 79.2 (L) 06/17/2024 1016   MCH 25.6 (L) 06/17/2024 1016   MCHC 32.3 06/17/2024 1016   RDW  13.0 06/17/2024 1016   LYMPHSABS 1.3 06/17/2024 1016   MONOABS 0.7 06/17/2024 1016   EOSABS 0.1 06/17/2024 1016   BASOSABS 0.0 06/17/2024 1016    CMP     Component Value Date/Time   NA 142 06/17/2024 1016   K 3.6 06/17/2024 1016   CL 103 06/17/2024 1016   CO2 25 06/17/2024 1016   GLUCOSE 109 (H) 06/17/2024 1016   BUN 7 06/17/2024 1016   CREATININE 0.79 06/17/2024 1016   CALCIUM  10.0 06/17/2024 1016   PROT 7.9 06/17/2024 1016   ALBUMIN  3.8 06/17/2024 1016   AST 14 (L) 06/17/2024 1016   ALT 9 06/17/2024 1016   ALKPHOS 80 06/17/2024 1016   BILITOT 0.3 06/17/2024 1016   GFRNONAA >60 06/17/2024 1016   GFRAA >60 05/26/2016 0719     Assessment/Plan:   Assessment & Plan Abnormal finding on GI tract imaging Lower abdominal pain Unintentional weight loss Nausea and vomiting Anemia Recurrent episodes with severe abdominal pain, nausea, and vomiting. Previous CT scans showed possible mild diverticulitis. Augmentin  was ineffective on 2 occasions. Differential includes smoldering diverticulitis, SCAD, IBD.  Has chronic anemia, denies current menstrual cycles. Has had unintentional weight loss of about 20 pounds in the last 3  months. Concern for smoldering diverticulitis and neurologic issues complicate colonoscopy planning.  Case and plan discussed with Dr. Albertus today.  - Labs today: CBC, CMP, iron/ferritin, B12, folate, ESR, CRP - Check fecal calprotectin - Will empirically treat for SCAD with Lialda  - 2.4 g/day for a month.  Asked patient to call immediately if her symptoms worsen, at which time we would repeat a CT scan.  Otherwise, advised her to call in 2 weeks with a symptom update. I will set reminder as well to check in with patient. - If repeat CT scan is indicated, and if evidence of diverticulitis, will plan to treat with Levaquin as Augmentin  has previously been ineffective - Follow up 4-6 weeks with Dr. Legrand to discuss colonoscopy and possibly EGD as well    Camie Furbish, PA-C [facility] 07/10/2024, 2:26 PM  Patient Care Team: Almarie Waddell NOVAK, NP as PCP - General (Family Medicine)

## 2024-07-11 ENCOUNTER — Other Ambulatory Visit

## 2024-07-11 ENCOUNTER — Encounter: Payer: Self-pay | Admitting: Gastroenterology

## 2024-07-11 DIAGNOSIS — R933 Abnormal findings on diagnostic imaging of other parts of digestive tract: Secondary | ICD-10-CM

## 2024-07-11 DIAGNOSIS — R1030 Lower abdominal pain, unspecified: Secondary | ICD-10-CM

## 2024-07-11 DIAGNOSIS — R634 Abnormal weight loss: Secondary | ICD-10-CM | POA: Diagnosis not present

## 2024-07-11 DIAGNOSIS — D649 Anemia, unspecified: Secondary | ICD-10-CM | POA: Diagnosis not present

## 2024-07-11 DIAGNOSIS — R112 Nausea with vomiting, unspecified: Secondary | ICD-10-CM | POA: Diagnosis not present

## 2024-07-14 LAB — CALPROTECTIN, FECAL: Calprotectin, Fecal: 209 ug/g — ABNORMAL HIGH (ref 0–120)

## 2024-07-15 ENCOUNTER — Ambulatory Visit: Payer: Self-pay | Admitting: Gastroenterology

## 2024-07-15 DIAGNOSIS — H40033 Anatomical narrow angle, bilateral: Secondary | ICD-10-CM | POA: Diagnosis not present

## 2024-07-15 DIAGNOSIS — H2513 Age-related nuclear cataract, bilateral: Secondary | ICD-10-CM | POA: Diagnosis not present

## 2024-07-15 DIAGNOSIS — F411 Generalized anxiety disorder: Secondary | ICD-10-CM | POA: Diagnosis not present

## 2024-07-15 DIAGNOSIS — H40013 Open angle with borderline findings, low risk, bilateral: Secondary | ICD-10-CM | POA: Diagnosis not present

## 2024-07-15 DIAGNOSIS — I10 Essential (primary) hypertension: Secondary | ICD-10-CM | POA: Diagnosis not present

## 2024-07-22 ENCOUNTER — Other Ambulatory Visit (HOSPITAL_BASED_OUTPATIENT_CLINIC_OR_DEPARTMENT_OTHER): Payer: Self-pay

## 2024-07-22 ENCOUNTER — Telehealth: Payer: Self-pay | Admitting: Gastroenterology

## 2024-07-22 ENCOUNTER — Other Ambulatory Visit: Payer: Self-pay | Admitting: Family Medicine

## 2024-07-22 MED ORDER — MECLIZINE HCL 25 MG PO TABS
25.0000 mg | ORAL_TABLET | Freq: Two times a day (BID) | ORAL | 0 refills | Status: AC | PRN
  Filled 2024-07-22: qty 30, 15d supply, fill #0

## 2024-07-22 NOTE — Telephone Encounter (Signed)
 The pt states that she has not had any recurrence of abd pain- she will call back if any pain returns

## 2024-07-22 NOTE — Telephone Encounter (Signed)
 Please call patient and see if she has had any recurrence of abdominal pain. If so, would recommend repeat CT A/P.

## 2024-08-07 ENCOUNTER — Other Ambulatory Visit (HOSPITAL_BASED_OUTPATIENT_CLINIC_OR_DEPARTMENT_OTHER): Payer: Self-pay | Admitting: Neurosurgery

## 2024-08-07 DIAGNOSIS — D329 Benign neoplasm of meninges, unspecified: Secondary | ICD-10-CM

## 2024-08-14 ENCOUNTER — Telehealth (HOSPITAL_COMMUNITY): Admitting: Psychiatry

## 2024-08-14 ENCOUNTER — Encounter (HOSPITAL_COMMUNITY): Payer: Self-pay

## 2024-09-05 ENCOUNTER — Ambulatory Visit: Admitting: Gastroenterology

## 2024-09-05 NOTE — Progress Notes (Unsigned)
 "     Alexa [name] GI Progress Note  Chief Complaint: ***  Subjective  Prior history  From APP clinic visit 07/10/2024:  Recurrent episodes with severe abdominal pain, nausea, and vomiting. Previous CT scans showed possible mild diverticulitis. Augmentin  was ineffective on 2 occasions. Differential includes smoldering diverticulitis, SCAD, IBD.  Has chronic anemia, denies current menstrual cycles. Has had unintentional weight loss of about 20 pounds in the last 3 months. Concern for smoldering diverticulitis and neurologic issues complicate colonoscopy planning.   Case and plan discussed with Dr. Albertus today.   - Labs today: CBC, CMP, iron/ferritin, B12, folate, ESR, CRP - Check fecal calprotectin - Will empirically treat for SCAD with Lialda  - 2.4 g/day for a month.  Asked patient to call immediately if her symptoms worsen, at which time we would repeat a CT scan.  Otherwise, advised her to call in 2 weeks with a symptom update. I will set reminder as well to check in with patient. - If repeat CT scan is indicated, and if evidence of diverticulitis, will plan to treat with Levaquin as Augmentin  has previously been ineffective - Follow up 4-6 weeks with Dr. Legrand to discuss colonoscopy and possibly EGD as well  Sed rate 79, calprotectin 209, patient stopped Lialda  after few days due to worsening headaches.  Microcytic anemia with borderline iron indices, advised to start iron sulfate Still bothered by nausea and due to see neurosurgery regarding her VP shunt. ______  Velton Hails neurosurgery 08/29/24 with impression and plan of note including the following: Alexa [name] is a/an 54 y.o. female with a hx of VPS by Dr. Rockey Peru at [facility] for PO CSF leak treatment after cerebellar meningioma, WHO grade 1, resection 02/26/2024 c/b nosocomial meningitis (prior to VPS )here today for evaluation of to manage VP shunt. No external CSF leak/drainage and recommend conservative  management  We discussed the nature of shunt, function and risk of overdrainage.   Xray today to confirm shunt valve, looks like Codman shunt, can not determine setting at this angle.   Meningioma surveillance by end of year December, has reprogrammable shunt, per patient setting is at 37.   Plan: Take Xray perpendicular to shunt prior to MRI then same day shunt reprogramming at [facility] or in clinic with us . I relayed this plan with patient by phone after visit today.   F/u sooner prn   No show for GI clinic visit 09/05/24   Discussed the use of AI scribe software for clinical note transcription with the patient, who gave verbal consent to proceed.  History of Present Illness     ROS: Cardiovascular:  no chest pain Respiratory: no dyspnea  The patient's Past Medical, Family and Social History were reviewed and are on file in the EMR. Past Medical History:  Diagnosis Date   Anemia    Anxiety    Depression    GERD (gastroesophageal reflux disease)    Headache    Hypertension    Meningioma (HCC)    PONV (postoperative nausea and vomiting)     Past Surgical History:  Procedure Laterality Date   APPLICATION OF CRANIAL NAVIGATION N/A 02/21/2024   Procedure: COMPUTER-ASSISTED NAVIGATION, FOR CRANIAL PROCEDURE;  Surgeon: Peru Rockey, MD;  Location: [facility] OR;  Service: Neurosurgery;  Laterality: N/A;   CRANIOTOMY  05/26/2016   Procedure: SUBOCCIPITAL CRANIOTOMY WITH PLACEMENT OF VENTRICULAR CATHETER;  Surgeon: Rockey Peru, MD;  Location: [facility] OR;  Service: Neurosurgery;;  CRANIOTOMY - SUBOCCIPITAL   CRANIOTOMY Left 02/21/2024  Procedure: CRANIOTOMY TUMOR EXCISION;  Surgeon: Gillie Duncans, MD;  Location[facility] OR;  Service: Neurosurgery;  Laterality: Left;  Occipital - left Craniotomy for tumor resection with Stealth   CRANIOTOMY Left 02/26/2024   Procedure: CRANIOTOMY TUMOR EXCISION;  Surgeon: Gillie Duncans, MD;  Location: [facility] OR;  Service: Neurosurgery;  Laterality: Left;   Craniotomy for tumor Stage II   LUMBAR WOUND DEBRIDEMENT N/A 03/18/2024   Procedure: WOUND DEBRIDEMENT;  Surgeon: Gillie Duncans, MD;  Location: [facility] OR;  Service: Neurosurgery;  Laterality: N/A;  Wound revision at cranial site   PLACEMENT OF LUMBAR DRAIN N/A 03/18/2024   Procedure: PLACEMENT OF LUMBAR DRAIN;  Surgeon: Gillie Duncans, MD;  Location: [facility] OR;  Service: Neurosurgery;  Laterality: N/A;  Placement of Lumbar Drain   VENTRICULOPERITONEAL SHUNT Right 03/24/2024   Procedure: SHUNT INSERTION VENTRICULAR-PERITONEAL With Laparoscopic verification of shunt placement into the peritoneum;  Surgeon: Gillie Duncans, MD;  Location: [facility] OR;  Service: Neurosurgery;  Laterality: Right;   WISDOM TOOTH EXTRACTION       Objective:  Med list reviewed Current Medications[1]   Vital signs in last 24 hrs: There were no vitals filed for this visit. Wt Readings from Last 3 Encounters:  07/10/24 188 lb (85.3 kg)  06/17/24 190 lb 14.7 oz (86.6 kg)  06/17/24 191 lb (86.6 kg)    Physical Exam  *** HEENT: sclera anicteric, oral mucosa moist without lesions Neck: supple, no thyromegaly, JVD or lymphadenopathy Cardiac: ***,  no peripheral edema Pulm: clear to auscultation bilaterally, normal RR and effort noted Abdomen: soft, *** tenderness, with active bowel sounds. No guarding or palpable hepatosplenomegaly. Skin; warm and dry, no jaundice or rash   Labs:  Sed rate 79 Calprotectin 209 ___________________________________________ Radiologic studies:   ____________________________________________ Other:   _____________________________________________   No diagnosis found.  Assessment and Plan Assessment & Plan      Plan:   I spent total of *** minutes in both face-to-face (*** minutes interview/exam) and non-face-to-face (*** minutes chart review, care coordination, documentation)  activities, excluding procedures performed, for the visit on the date of this encounter.   Alexa [name] Brand III     [1]  Current Outpatient Medications:    acetaminophen  (TYLENOL ) 325 MG tablet, Take 2 tablets (650 mg total) by mouth in the morning, at noon, and at bedtime. (Patient not taking: Reported on 07/10/2024), Disp: , Rfl:    atenolol  (TENORMIN ) 50 MG tablet, Take 1 tablet (50 mg total) by mouth daily. (Patient not taking: Reported on 07/10/2024), Disp: 90 tablet, Rfl: 3   buPROPion  (WELLBUTRIN  SR) 150 MG 12 hr tablet, Take 1 tablet (150 mg total) by mouth daily. (Patient not taking: Reported on 07/10/2024), Disp: 30 tablet, Rfl: 0   escitalopram  (LEXAPRO ) 20 MG tablet, Take 2 tablets (40 mg total) by mouth daily. (Patient not taking: Reported on 07/10/2024), Disp: 60 tablet, Rfl: 0   hydrochlorothiazide  (HYDRODIURIL ) 12.5 MG tablet, Take 1 tablet (12.5 mg total) by mouth daily. (Patient not taking: Reported on 07/10/2024), Disp: 90 tablet, Rfl: 3   mesalamine  (LIALDA ) 1.2 g EC tablet, Take 2 tablets (2.4 g total) by mouth daily with breakfast., Disp: 60 tablet, Rfl: 1   metoCLOPramide  (REGLAN ) 10 MG tablet, Take 1 tablet (10 mg total) by mouth every 6 (six) hours as needed for nausea or vomiting. (Patient not taking: Reported on 07/10/2024), Disp: 30 tablet, Rfl: 0   promethazine  (PHENERGAN ) 25 MG suppository, Place 1 suppository (25 mg total) rectally every 6 (six) hours as needed  for nausea or vomiting. (Patient not taking: Reported on 07/10/2024), Disp: 12 each, Rfl: 0   SODIUM FLUORIDE , DENTAL RINSE, (PREVIDENT ) 0.2 % SOLN, USE AS AN ORAL RINSE, AS DIRECTED ON PACKAGE (Patient not taking: Reported on 07/10/2024), Disp: 473 mL, Rfl: 5  "
